# Patient Record
Sex: Female | Born: 1980 | Race: White | Hispanic: No | Smoking: Former smoker
Health system: Southern US, Community
[De-identification: ages and names within clinical notes are randomized; demographics above are authoritative.]

## PROBLEM LIST (undated history)

## (undated) ENCOUNTER — Inpatient Hospital Stay: Admission: EM | Payer: Self-pay | Source: Home / Self Care

## (undated) DIAGNOSIS — C50919 Malignant neoplasm of unspecified site of unspecified female breast: Secondary | ICD-10-CM

## (undated) DIAGNOSIS — Z8719 Personal history of other diseases of the digestive system: Secondary | ICD-10-CM

## (undated) DIAGNOSIS — Z9889 Other specified postprocedural states: Secondary | ICD-10-CM

## (undated) DIAGNOSIS — K279 Peptic ulcer, site unspecified, unspecified as acute or chronic, without hemorrhage or perforation: Secondary | ICD-10-CM

## (undated) DIAGNOSIS — T7840XA Allergy, unspecified, initial encounter: Secondary | ICD-10-CM

## (undated) DIAGNOSIS — Z9012 Acquired absence of left breast and nipple: Secondary | ICD-10-CM

## (undated) DIAGNOSIS — F419 Anxiety disorder, unspecified: Secondary | ICD-10-CM

## (undated) HISTORY — DX: Peptic ulcer, site unspecified, unspecified as acute or chronic, without hemorrhage or perforation: K27.9

## (undated) HISTORY — DX: Anxiety disorder, unspecified: F41.9

## (undated) HISTORY — DX: Allergy, unspecified, initial encounter: T78.40XA

## (undated) HISTORY — DX: Malignant neoplasm of unspecified site of unspecified female breast: C50.919

## (undated) HISTORY — PX: WISDOM TOOTH EXTRACTION: SHX21

---

## 2020-09-22 ENCOUNTER — Encounter: Payer: Self-pay | Admitting: Obstetrics & Gynecology

## 2020-10-06 ENCOUNTER — Ambulatory Visit (INDEPENDENT_AMBULATORY_CARE_PROVIDER_SITE_OTHER): Payer: BC Managed Care – PPO | Admitting: Adult Health

## 2020-10-06 ENCOUNTER — Encounter: Payer: Self-pay | Admitting: Adult Health

## 2020-10-06 ENCOUNTER — Other Ambulatory Visit: Payer: Self-pay

## 2020-10-06 VITALS — BP 115/76 | HR 92 | Ht 64.0 in | Wt 129.0 lb

## 2020-10-06 DIAGNOSIS — N6320 Unspecified lump in the left breast, unspecified quadrant: Secondary | ICD-10-CM | POA: Insufficient documentation

## 2020-10-06 DIAGNOSIS — R1032 Left lower quadrant pain: Secondary | ICD-10-CM

## 2020-10-06 DIAGNOSIS — N644 Mastodynia: Secondary | ICD-10-CM | POA: Insufficient documentation

## 2020-10-06 HISTORY — DX: Unspecified lump in the left breast, unspecified quadrant: N63.20

## 2020-10-06 HISTORY — DX: Mastodynia: N64.4

## 2020-10-06 HISTORY — DX: Left lower quadrant pain: R10.32

## 2020-10-06 NOTE — Progress Notes (Signed)
Patient ID: Pamela Reyes, female   DOB: 1980-08-08, 39 y.o.   MRN: [id number] History of Present Illness: Pamela Reyes is a 40 year old white female, single, G0P0, in complaining of left breast pain and pelvic pain, for 3-4 weeks, on left side, like an ache and pain with sex at times. She is a new pt. She moved here from [place]. She had mammogram, [place] and MRI 2021 in [place], ?cystic tissue   Current Medications, Allergies, Past Medical History, Past Surgical History, Family History and Social History were reviewed in [employer] record.     Review of Systems: Patient denies any headaches, hearing loss, fatigue, blurred vision, shortness of breath, chest pain,problems with bowel movements, urination, or intercourse. No joint pain or mood swings.  See HPI for positives.   Physical Exam:BP 115/76 (BP Location: Left Arm, Patient Position: Sitting, Cuff Size: Normal)   Pulse 92   Ht '5\' 4"'$  (1.626 m)   Wt 129 lb (58.5 kg)   LMP 09/22/2020   BMI 22.14 kg/m   General:  Well developed, well nourished, no acute distress Skin:  Warm and dry Neck:  Midline trachea, normal thyroid, good ROM, no lymphadenopathy Lungs; Clear to auscultation bilaterally Breast:  No dominant palpable mass, retraction, or nipple discharge, on the right, on the left, no nipple discharge or retraction has 3 x 5 cm from mass at 12 0 'clock at areola and irregularity around it  Cardiovascular: Regular rate and rhythm Abdomen:  Soft, non tender, no hepatosplenomegaly Pelvic:  External genitalia is normal in appearance, no lesions.  The vagina is normal in appearance. Urethra has no lesions or masses. The cervix is smooth.  Uterus is felt to be normal size, shape, and contour.  No adnexal masses or tenderness noted.Bladder is non tender, no masses felt. Rectal: Deferred Extremities/musculoskeletal:  No swelling or varicosities noted, no clubbing or cyanosis Psych:  No mood changes, alert and cooperative,seems happy AA is  4 Fall risk is lo Depression screen [facility] 2/9 10/06/2020  Decreased Interest 0  Down, Depressed, Hopeless 0  PHQ - 2 Score 0  Altered sleeping 0  Tired, decreased energy 1  Change in appetite 0  Feeling bad or failure about yourself  0  Trouble concentrating 0  Moving slowly or fidgety/restless 0  Suicidal thoughts 0  PHQ-9 Score 1    GAD 7 : Generalized Anxiety Score 10/06/2020  Nervous, Anxious, on Edge 1  Control/stop worrying 1  Worry too much - different things 0  Trouble relaxing 0  Restless 0  Easily annoyed or irritable 0  Afraid - awful might happen 1  Total GAD 7 Score 3      Upstream - 10/06/20 1448       Pregnancy Intention Screening   Does the patient want to become pregnant in the next year? No    Does the patient's partner want to become pregnant in the next year? No    Would the patient like to discuss contraceptive options today? No      Contraception Wrap Up   Current Method Oral Contraceptive    End Method Oral Contraceptive    Contraception Counseling Provided No            Examination chaperoned by Levy Pupa LPN   Impression and Plan: 1. Breast pain, left Scheduled diagnostic mammogram  and [place] at [facility] 10/28/20 at 10:30 am, to request films from [place] - US BREAST LTD UNI RIGHT INC AXILLA; Future - MM  DIAG BREAST TOMO BILATERAL; Future - US BREAST LTD UNI LEFT INC AXILLA; Future  2. Left breast mass - US BREAST LTD UNI RIGHT INC AXILLA; Future - MM DIAG BREAST TOMO BILATERAL; Future - US BREAST LTD UNI LEFT INC AXILLA; Future  3. LLQ pain Return to office for GYN [place] in about a week - US PELVIC COMPLETE WITH TRANSVAGINAL; Future  Pap and physical in December with me

## 2020-10-13 ENCOUNTER — Ambulatory Visit
Admission: RE | Admit: 2020-10-13 | Discharge: 2020-10-13 | Disposition: A | Discharge status: Home / Self Care | Payer: Self-pay | Source: Ambulatory Visit | Attending: Adult Health | Admitting: Adult Health

## 2020-10-13 ENCOUNTER — Other Ambulatory Visit: Payer: Self-pay | Admitting: Adult Health

## 2020-10-13 ENCOUNTER — Inpatient Hospital Stay
Admission: RE | Admit: 2020-10-13 | Discharge: 2020-10-13 | Disposition: A | Discharge status: Home / Self Care | Payer: Self-pay | Source: Ambulatory Visit | Attending: Adult Health | Admitting: Adult Health

## 2020-10-13 DIAGNOSIS — N644 Mastodynia: Secondary | ICD-10-CM

## 2020-10-13 DIAGNOSIS — N6320 Unspecified lump in the left breast, unspecified quadrant: Secondary | ICD-10-CM

## 2020-10-16 ENCOUNTER — Other Ambulatory Visit: Payer: Self-pay | Admitting: Adult Health

## 2020-10-16 ENCOUNTER — Inpatient Hospital Stay
Admission: RE | Admit: 2020-10-16 | Discharge: 2020-10-16 | Disposition: A | Discharge status: Home / Self Care | Payer: Self-pay | Source: Ambulatory Visit | Attending: Adult Health | Admitting: Adult Health

## 2020-10-16 ENCOUNTER — Ambulatory Visit
Admission: RE | Admit: 2020-10-16 | Discharge: 2020-10-16 | Disposition: A | Discharge status: Home / Self Care | Payer: Self-pay | Source: Ambulatory Visit | Attending: Adult Health | Admitting: Adult Health

## 2020-10-16 DIAGNOSIS — N644 Mastodynia: Secondary | ICD-10-CM

## 2020-10-16 DIAGNOSIS — N6320 Unspecified lump in the left breast, unspecified quadrant: Secondary | ICD-10-CM

## 2020-10-21 ENCOUNTER — Other Ambulatory Visit: Payer: Self-pay

## 2020-10-21 ENCOUNTER — Ambulatory Visit (INDEPENDENT_AMBULATORY_CARE_PROVIDER_SITE_OTHER): Payer: BC Managed Care – PPO

## 2020-10-21 DIAGNOSIS — R1032 Left lower quadrant pain: Secondary | ICD-10-CM | POA: Diagnosis not present

## 2020-10-21 NOTE — Progress Notes (Signed)
PELVIC US TA/TV: homogeneous anteverted uterus,posterior right subserosal fibroid 1 x .6 x .9 cm,EEC 4.1 mm,normal ovaries,ovaries appear mobile,no free fluid,no pain during ultrasound   Chaperone peggy

## 2020-10-23 ENCOUNTER — Other Ambulatory Visit: Payer: Self-pay | Admitting: Registered Nurse

## 2020-10-23 ENCOUNTER — Ambulatory Visit (INDEPENDENT_AMBULATORY_CARE_PROVIDER_SITE_OTHER): Payer: BC Managed Care – PPO | Admitting: Registered Nurse

## 2020-10-23 ENCOUNTER — Encounter: Payer: Self-pay | Admitting: Registered Nurse

## 2020-10-23 ENCOUNTER — Other Ambulatory Visit: Payer: Self-pay

## 2020-10-23 VITALS — BP 122/63 | HR 80 | Temp 98.2°F | Resp 18 | Ht 64.0 in | Wt 126.0 lb

## 2020-10-23 DIAGNOSIS — Z803 Family history of malignant neoplasm of breast: Secondary | ICD-10-CM

## 2020-10-23 DIAGNOSIS — Z1329 Encounter for screening for other suspected endocrine disorder: Secondary | ICD-10-CM

## 2020-10-23 DIAGNOSIS — E782 Mixed hyperlipidemia: Secondary | ICD-10-CM

## 2020-10-23 DIAGNOSIS — R202 Paresthesia of skin: Secondary | ICD-10-CM

## 2020-10-23 DIAGNOSIS — Z130 Encounter for screening for diseases of the blood and blood-forming organs and certain disorders involving the immune mechanism: Secondary | ICD-10-CM

## 2020-10-23 DIAGNOSIS — Z13220 Encounter for screening for lipoid disorders: Secondary | ICD-10-CM

## 2020-10-23 DIAGNOSIS — R1012 Left upper quadrant pain: Secondary | ICD-10-CM

## 2020-10-23 DIAGNOSIS — Z13228 Encounter for screening for other metabolic disorders: Secondary | ICD-10-CM | POA: Diagnosis not present

## 2020-10-23 DIAGNOSIS — N644 Mastodynia: Secondary | ICD-10-CM

## 2020-10-23 LAB — CBC WITH DIFFERENTIAL/PLATELET
Basophils Absolute: 0 10*3/uL (ref 0.0–0.1)
Basophils Relative: 0.4 % (ref 0.0–3.0)
Eosinophils Absolute: 0 10*3/uL (ref 0.0–0.7)
Eosinophils Relative: 0.2 % (ref 0.0–5.0)
HCT: 42.2 % (ref 36.0–46.0)
Hemoglobin: 14 g/dL (ref 12.0–15.0)
Lymphocytes Relative: 30 % (ref 12.0–46.0)
Lymphs Abs: 1.6 10*3/uL (ref 0.7–4.0)
MCHC: 33.3 g/dL (ref 30.0–36.0)
MCV: 91.2 fl (ref 78.0–100.0)
Monocytes Absolute: 0.3 10*3/uL (ref 0.1–1.0)
Monocytes Relative: 5.5 % (ref 3.0–12.0)
Neutro Abs: 3.3 10*3/uL (ref 1.4–7.7)
Neutrophils Relative %: 63.9 % (ref 43.0–77.0)
Platelets: 291 10*3/uL (ref 150.0–400.0)
RBC: 4.63 Mil/uL (ref 3.87–5.11)
RDW: 13.5 % (ref 11.5–15.5)
WBC: 5.2 10*3/uL (ref 4.0–10.5)

## 2020-10-23 LAB — HEMOGLOBIN A1C: Hgb A1c MFr Bld: 5.2 % (ref 4.6–6.5)

## 2020-10-23 LAB — COMPREHENSIVE METABOLIC PANEL
ALT: 9 U/L (ref 0–35)
AST: 10 U/L (ref 0–37)
Albumin: 4.4 g/dL (ref 3.5–5.2)
Alkaline Phosphatase: 24 U/L — ABNORMAL LOW (ref 39–117)
BUN: 16 mg/dL (ref 6–23)
CO2: 22 mEq/L (ref 19–32)
Calcium: 9.6 mg/dL (ref 8.4–10.5)
Chloride: 103 mEq/L (ref 96–112)
Creatinine, Ser: 0.72 mg/dL (ref 0.40–1.20)
GFR: 104.14 mL/min (ref >60.00)
Glucose, Bld: 86 mg/dL (ref 70–99)
Potassium: 4.3 mEq/L (ref 3.5–5.1)
Sodium: 138 mEq/L (ref 135–145)
Total Bilirubin: 0.4 mg/dL (ref 0.2–1.2)
Total Protein: 7.2 g/dL (ref 6.0–8.3)

## 2020-10-23 LAB — B12 AND FOLATE PANEL
Folate: 7.6 ng/mL (ref >5.9)
Vitamin B-12: 431 pg/mL (ref 211–911)

## 2020-10-23 LAB — LIPID PANEL
Cholesterol: 269 mg/dL — ABNORMAL HIGH (ref 0–200)
HDL: 53.1 mg/dL (ref >39.00)
LDL Cholesterol: 198 mg/dL — ABNORMAL HIGH (ref 0–99)
NonHDL: 215.66
Total CHOL/HDL Ratio: 5
Triglycerides: 90 mg/dL (ref 0.0–149.0)
VLDL: 18 mg/dL (ref 0.0–40.0)

## 2020-10-23 LAB — VITAMIN D 25 HYDROXY (VIT D DEFICIENCY, FRACTURES): VITD: 34.3 ng/mL (ref 30.00–100.00)

## 2020-10-23 LAB — TSH: TSH: 1.25 u[IU]/mL (ref 0.35–5.50)

## 2020-10-23 MED ORDER — ROSUVASTATIN CALCIUM 10 MG PO TABS: 10.0000 mg | ORAL_TABLET | Freq: Every day | ORAL | 3 refills | Status: DC

## 2020-10-23 NOTE — Patient Instructions (Addendum)
Ms. Gabreil Amsbaugh to meet you  In brief: Labs today Start pantoprazole '40mg'$  once daily. Take for at least two weeks. Monitor response. Let me know if new or different symptoms arise. Return in 6 mo for annual exam, repeat labs Call if symptoms worsening or failing to improve I'll be able to follow along with mammo results in your chart  Thank you  Rich     If you have lab work done today you will be contacted with your lab results within the next 2 weeks.  If you have not heard from [place] then please contact us. The fastest way to get your results is to register for My Chart.   IF you received an x-ray today, you will receive an invoice from [facility]. Please contact [facility] at [phone] with questions or concerns regarding your invoice.   IF you received labwork today, you will receive an invoice from [facility]. Please contact [facility] at [phone] with questions or concerns regarding your invoice.   Our billing staff will not be able to assist you with questions regarding bills from these companies.  You will be contacted with the lab results as soon as they are available. The fastest way to get your results is to activate your My Chart account. Instructions are located on the last page of this paperwork. If you have not heard from [place] regarding the results in 2 weeks, please contact this office.

## 2020-10-23 NOTE — Progress Notes (Signed)
New Patient Office Visit  Subjective:  Patient ID: Pamela Reyes, female    DOB: 04-12-1980  Age: 40 y.o. MRN: [id number]  CC:  Chief Complaint  Patient presents with   New Patient (Initial Visit)    Patient states she is here to [facility] care. Patient has been having some pain on left side of ribcage, dhoulder and armpit for one year.    HPI Wanette Seim presents for visit to est  Histories reviewed and updated with patient.   Left side pain Lower left chest / upper left abdomen On and off for some time Hx of peptic ulcer. Concerned it feels similar. Has not been on NSAIDs Not taking medication for relief. Fam hx of breast ca - has mammo and [place] next week. 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  Some shoulder pain, thinks this is unrelated, does not think her pain of concern is msk Does note some numbness in hands, particularly L hand. Unsure if related.   Otherwise no concerns. Doing well. Relocated to [place] from [place] in Nov 2021  Past Medical History:  Diagnosis Date   Anxiety    Peptic ulcer     History reviewed. No pertinent surgical history.  Family History  Problem Relation Age of Onset   Congestive Heart Failure Father    Breast cancer Sister    Stroke Maternal Grandmother    Cancer Maternal Grandfather        lung   Lung cancer Maternal Grandfather        smoker   Breast cancer Paternal Grandmother     Social History   Socioeconomic History   Marital status: Significant Other    Spouse name: Not on file   Number of children: 0   Years of education: Not on file   Highest education level: Not on file  Occupational History   Not on file  Tobacco Use   Smoking status: Former    Years: 2.00    Types: Cigarettes   Smokeless tobacco: Never  Vaping Use   Vaping Use: Never used  Substance and Sexual Activity   Alcohol use: Yes    Comment: 2-3 times a week   Drug  use: Never   Sexual activity: Yes    Birth control/protection: Pill  Other Topics Concern   Not on file  Social History Narrative   Not on file   Social Determinants of Health   Financial Resource Strain: Low Risk    Difficulty of Paying Living Expenses: Not hard at all  Food Insecurity: No Food Insecurity   Worried About Charity fundraiser in the Last Year: Never true   Ran Out of Food in the Last Year: Never true  Transportation Needs: No Transportation Needs   Lack of Transportation (Medical): No   Lack of Transportation (Non-Medical): No  Physical Activity: Insufficiently Active   Days of Exercise per Week: 2 days   Minutes of Exercise per Session: 30 min  Stress: Stress Concern Present   Feeling of Stress : To some extent  Social Connections: Moderately Isolated   Frequency of Communication with Friends and Family: Twice a week   Frequency of Social Gatherings with Friends and Family: Twice a week   Attends Religious Services: Never   Marine scientist or Organizations: No   Attends Archivist Meetings: Never   Marital Status: Living with partner  Human resources officer  Violence: Not At Risk   Fear of Current or Ex-Partner: No   Emotionally Abused: No   Physically Abused: No   Sexually Abused: No    ROS Review of Systems  Constitutional: Negative.   HENT: Negative.    Eyes: Negative.   Respiratory: Negative.    Cardiovascular: Negative.   Gastrointestinal: Negative.   Genitourinary: Negative.   Musculoskeletal: Negative.   Skin: Negative.   Neurological: Negative.   Psychiatric/Behavioral: Negative.    All other systems reviewed and are negative.  Objective:   Today's Vitals: BP 122/63   Pulse 80   Temp 98.2 F (36.8 C) (Temporal)   Resp 18   Ht '5\' 4"'$  (1.626 m)   Wt 126 lb (57.2 kg)   SpO2 99%   BMI 21.63 kg/m   Physical Exam Vitals and nursing note reviewed.  Constitutional:      General: She is not in acute distress.    Appearance:  Normal appearance. She is not ill-appearing, toxic-appearing or diaphoretic.  Cardiovascular:     Rate and Rhythm: Normal rate and regular rhythm.     Pulses: Normal pulses.     Heart sounds: Normal heart sounds. No murmur heard.   No friction rub. No gallop.  Pulmonary:     Effort: Pulmonary effort is normal. No respiratory distress.     Breath sounds: Normal breath sounds. No stridor. No wheezing, rhonchi or rales.  Chest:     Chest wall: No tenderness.  Musculoskeletal:        General: No swelling, tenderness, deformity or signs of injury. Normal range of motion.     Right lower leg: No edema.     Left lower leg: No edema.  Skin:    General: Skin is warm and dry.     Capillary Refill: Capillary refill takes less than 2 seconds.  Neurological:     General: No focal deficit present.     Mental Status: She is alert and oriented to person, place, and time. Mental status is at baseline.  Psychiatric:        Mood and Affect: Mood normal.        Behavior: Behavior normal.        Thought Content: Thought content normal.        Judgment: Judgment normal.    Assessment & Plan:   Problem List Items Addressed This Visit       Other   Breast pain, left   Other Visit Diagnoses     Abdominal discomfort in left upper quadrant    -  Primary   Arm paresthesia, left       Relevant Orders   Vitamin D (25 hydroxy)   B12 and Folate Panel   Screening for endocrine, metabolic and immunity disorder       Relevant Orders   CBC with Differential/Platelet   Comprehensive metabolic panel   Hemoglobin A1c   TSH   Lipid screening       Relevant Orders   Lipid panel   Family history of breast cancer           Outpatient Encounter Medications as of 10/23/2020  Medication Sig   JUNEL FE 1.5/30 1.5-30 MG-MCG tablet Take 1 tablet by mouth daily.   No facility-administered encounter medications on file as of 10/23/2020.    Follow-up: Return in about 6 months (around 04/22/2021) for CPE and  labs.   PLAN Pantoprazole '40mg'$  po qd. Suspect gastritis, perhaps start of PUD, but not certain -  labs Labs collected. Will follow up with the patient as warranted. Consider CT abd/pelv or cxr pending mammo results and response to therapy.  Continue Junel managed by GYN Return in 6 mo for CPE, repeat labs Patient encouraged to call clinic with any questions, comments, or concerns.  Maximiano Coss, NP

## 2020-10-25 ENCOUNTER — Encounter: Payer: Self-pay | Admitting: Registered Nurse

## 2020-10-27 ENCOUNTER — Telehealth: Payer: Self-pay | Admitting: Adult Health

## 2020-10-27 ENCOUNTER — Encounter: Payer: Self-pay | Admitting: Adult Health

## 2020-10-27 DIAGNOSIS — D219 Benign neoplasm of connective and other soft tissue, unspecified: Secondary | ICD-10-CM

## 2020-10-27 HISTORY — DX: Benign neoplasm of connective and other soft tissue, unspecified: D21.9

## 2020-10-27 NOTE — Telephone Encounter (Signed)
Pt aware that US showed normal ovaries and small 1 cm fibroid, should not be cause of LLQ pain could be bowel related

## 2020-10-28 ENCOUNTER — Ambulatory Visit (HOSPITAL_COMMUNITY)
Admission: RE | Admit: 2020-10-28 | Discharge: 2020-10-28 | Disposition: A | Discharge status: Home / Self Care | Payer: BC Managed Care – PPO | Source: Ambulatory Visit | Attending: Adult Health | Admitting: Adult Health

## 2020-10-28 ENCOUNTER — Other Ambulatory Visit: Payer: Self-pay

## 2020-10-28 ENCOUNTER — Encounter (HOSPITAL_COMMUNITY): Payer: Self-pay

## 2020-10-28 ENCOUNTER — Other Ambulatory Visit (HOSPITAL_COMMUNITY): Payer: Self-pay | Admitting: Adult Health

## 2020-10-28 DIAGNOSIS — N6320 Unspecified lump in the left breast, unspecified quadrant: Secondary | ICD-10-CM

## 2020-10-28 DIAGNOSIS — N644 Mastodynia: Secondary | ICD-10-CM

## 2020-10-28 IMAGING — US US BREAST*L* LIMITED INC AXILLA
1 series · 13 of 25 positions shown · non-contrast
Comparison: Previous exams.

CLINICAL DATA: 40-year-old female with a palpable area of concern
in the left breast which she reports is slowly growing in size.
History of benign MRI guided biopsy of the left breast in [24]. The
patient does have a strong history of breast cancer, including in
her sister having been diagnosed with breast cancer as well as a
maternal aunt and paternal grandmother.

EXAM:
DIGITAL DIAGNOSTIC BILATERAL MAMMOGRAM WITH TOMOSYNTHESIS AND CAD;
ULTRASOUND LEFT BREAST LIMITED
TECHNIQUE: Bilateral digital diagnostic mammography and breast tomosynthesis
was performed. The images were evaluated with computer-aided
detection.; Targeted ultrasound examination of the left breast was
performed.

[Series 1: us breast*left* limited inc axilla · 0.07mm/px · 13 of 32 slices shown]
[im 1/32]
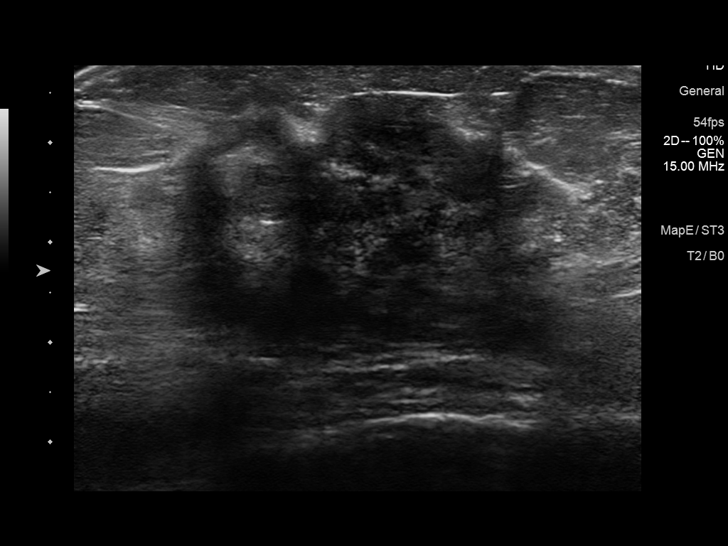
[im 3/32]
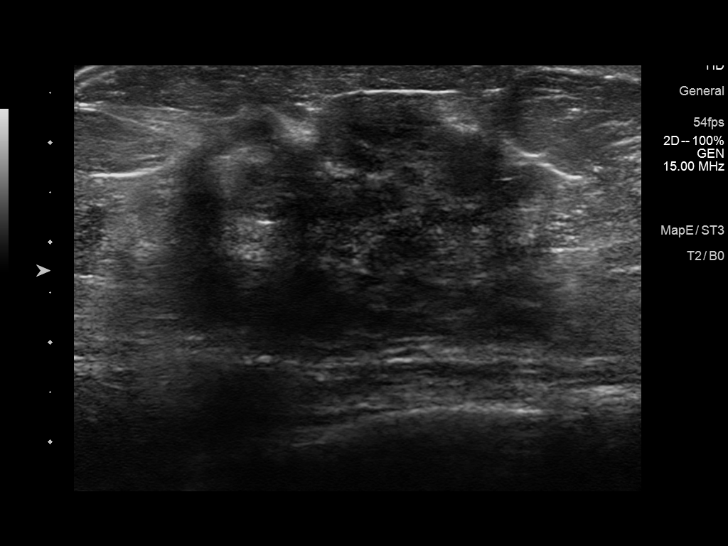
[im 6/32]
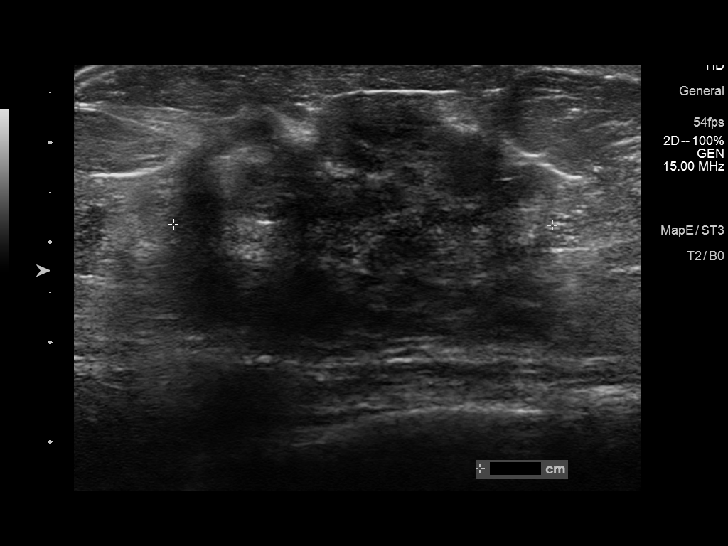
[im 8/32]
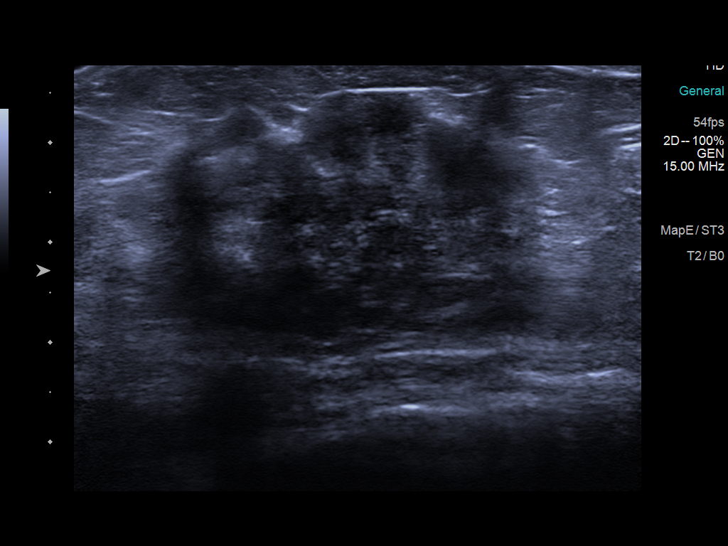
[im 11/32]
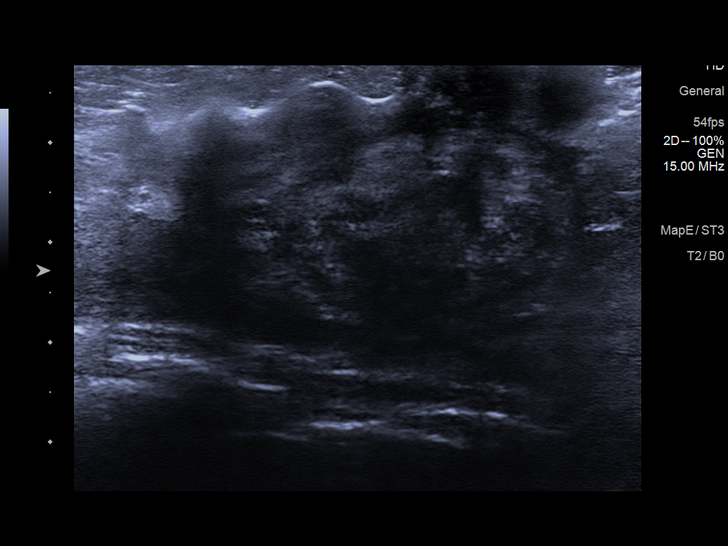
[im 13/32]
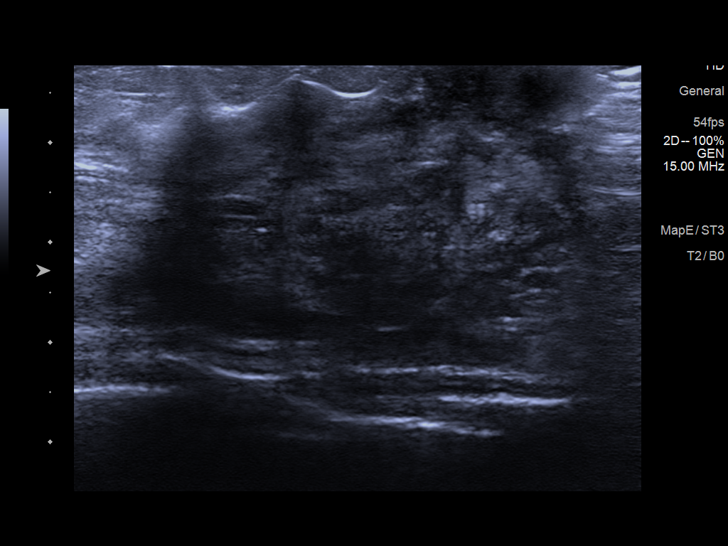
[im 16/32]
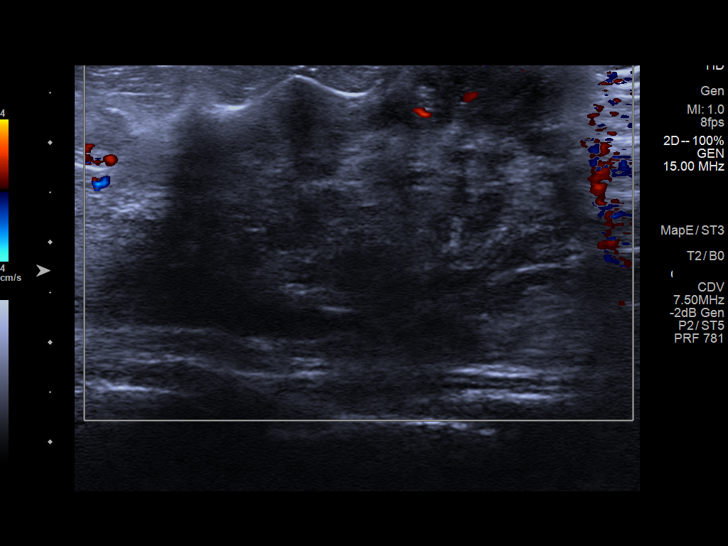
[im 19/32]
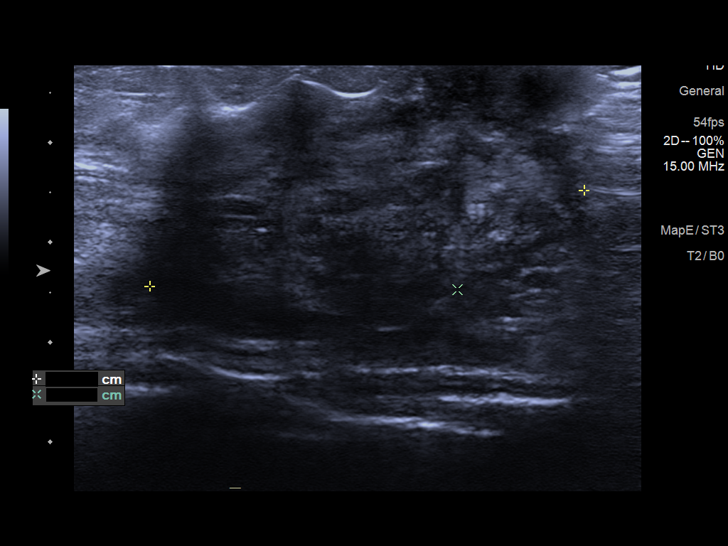
[im 21/32]
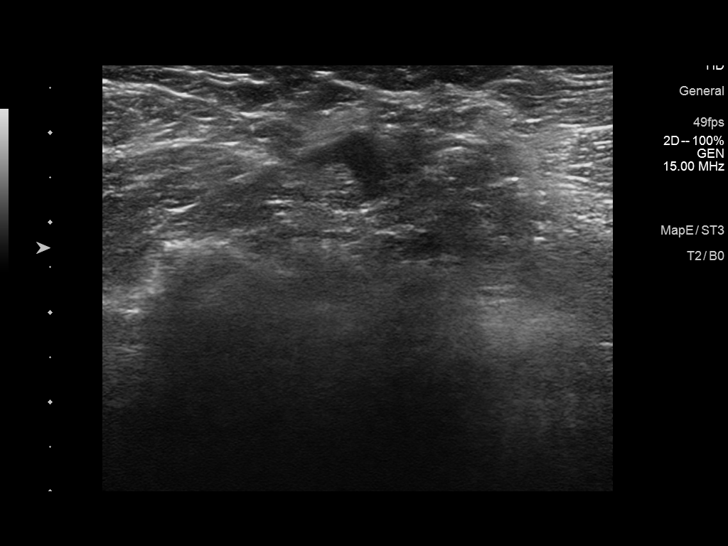
[im 24/32]
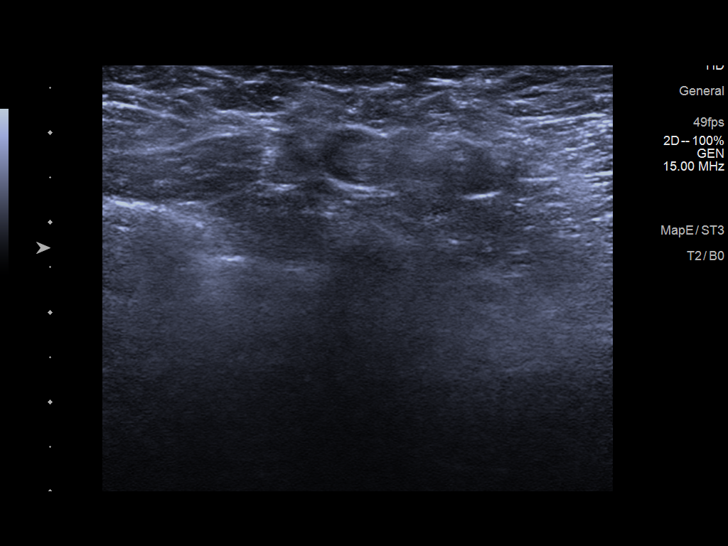
[im 26/32]
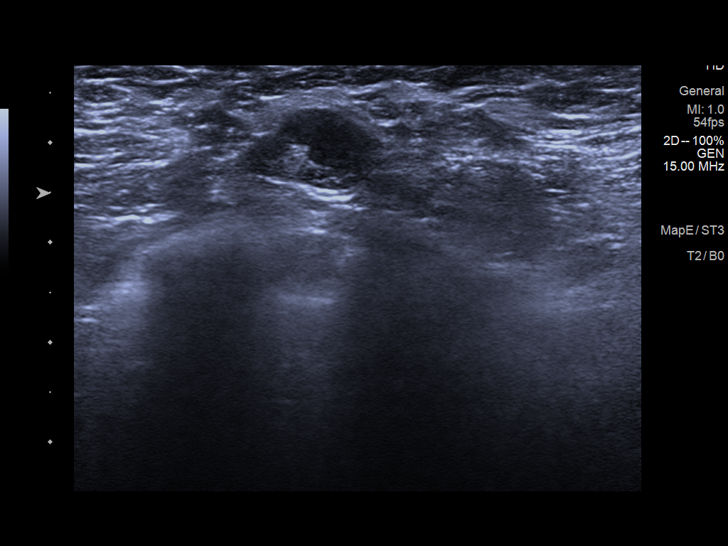
[im 29/32]
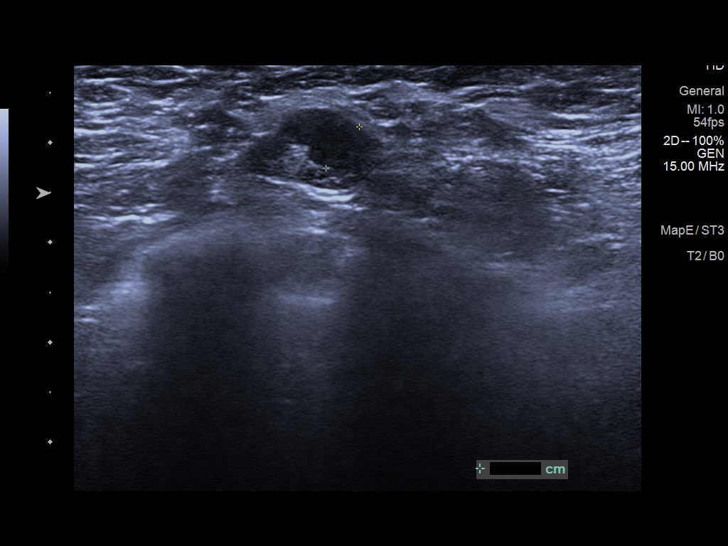
[im 32/32]
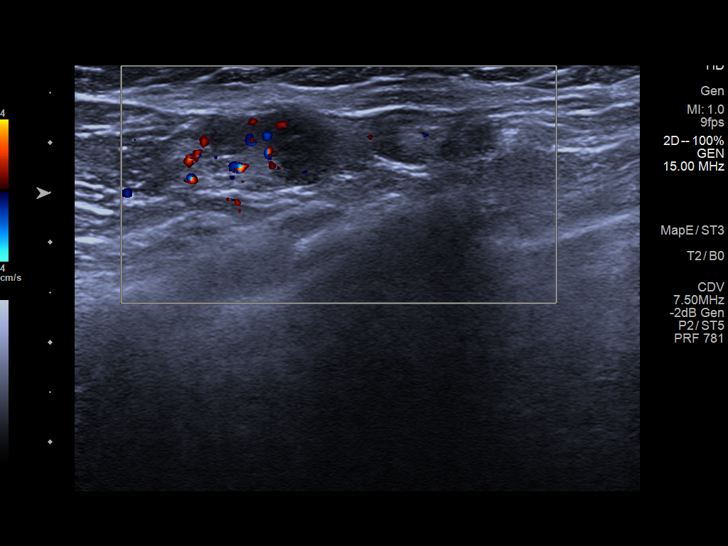

[13 of 25 positions shown; findings below may reference images not displayed]

ACR Breast Density Category d: The breast tissue is extremely dense,
which lowers the sensitivity of mammography.
FINDINGS: There is masslike increased density predominantly involving the
central left breast and corresponding to the palpable area of
concern. The left breast appears progressively smaller with
flattening of the left nipple. Initially questioned scattered
calcifications in outer left breast are felt to be stable. Several
lymph nodes are partially identified in the left axilla with
possible cortical thickening. No suspicious masses or calcifications
seen in the right breast.

Physical examination reveals a large firm masslike area involving
the entire central left breast.

Targeted ultrasound of the left breast was performed. There is a
large ill-defined hypoechoic area/mass in the left breast at 12
o'clock retroareolar measuring 4.5 x 2.3 x 3.8 cm. This is felt to
correspond the masslike density involving the central left breast.
There is a lymph node identified with cortical thickening in the
left axilla.
IMPRESSION: 1. Suspicious palpable mass/hypoechoic area in the left breast at 12
o'clock retroareolar measuring 4.5 cm.

2.  Suspicious lymph node in the left axilla.

RECOMMENDATION:
1. Recommend ultrasound-guided biopsy of the mass in the left breast
at 12 o'clock retroareolar.

2. Recommend ultrasound-guided biopsy of the abnormal lymph node in
the left axilla.

I have discussed the findings and recommendations with the patient.
If applicable, a reminder letter will be sent to the patient
regarding the next appointment.

BI-RADS CATEGORY  4: Suspicious.

## 2020-10-28 IMAGING — MG DIGITAL DIAGNOSTIC BILAT W/ TOMO W/ CAD
8 of 12 series · 8 of 32 positions shown · non-contrast
Comparison: Previous exams.

CLINICAL DATA: 40-year-old female with a palpable area of concern
in the left breast which she reports is slowly growing in size.
History of benign MRI guided biopsy of the left breast in [24]. The
patient does have a strong history of breast cancer, including in
her sister having been diagnosed with breast cancer as well as a
maternal aunt and paternal grandmother.

EXAM:
DIGITAL DIAGNOSTIC BILATERAL MAMMOGRAM WITH TOMOSYNTHESIS AND CAD;
ULTRASOUND LEFT BREAST LIMITED
TECHNIQUE: Bilateral digital diagnostic mammography and breast tomosynthesis
was performed. The images were evaluated with computer-aided
detection.; Targeted ultrasound examination of the left breast was
performed.

[L ML]
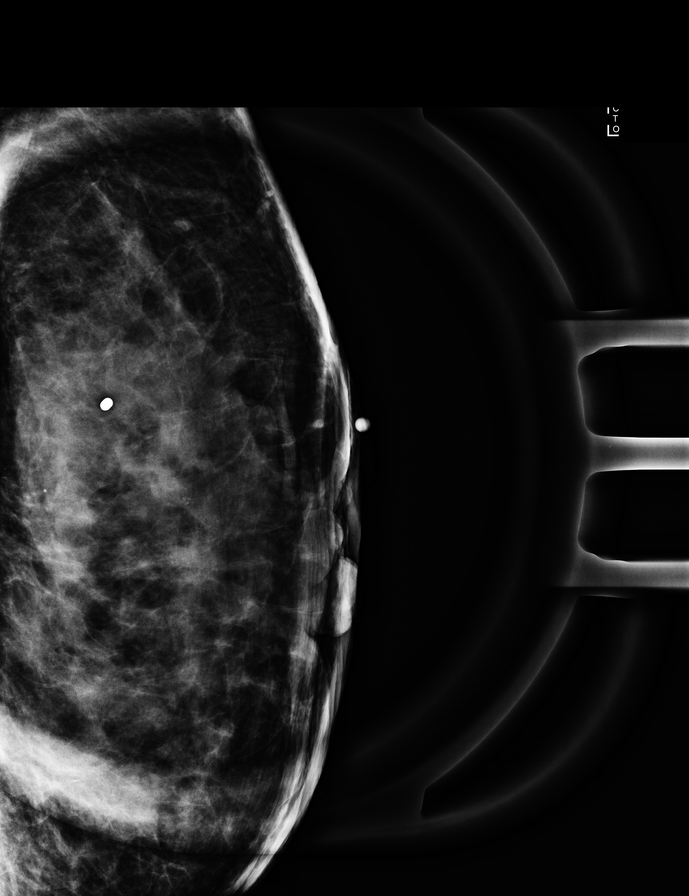

[L CC]
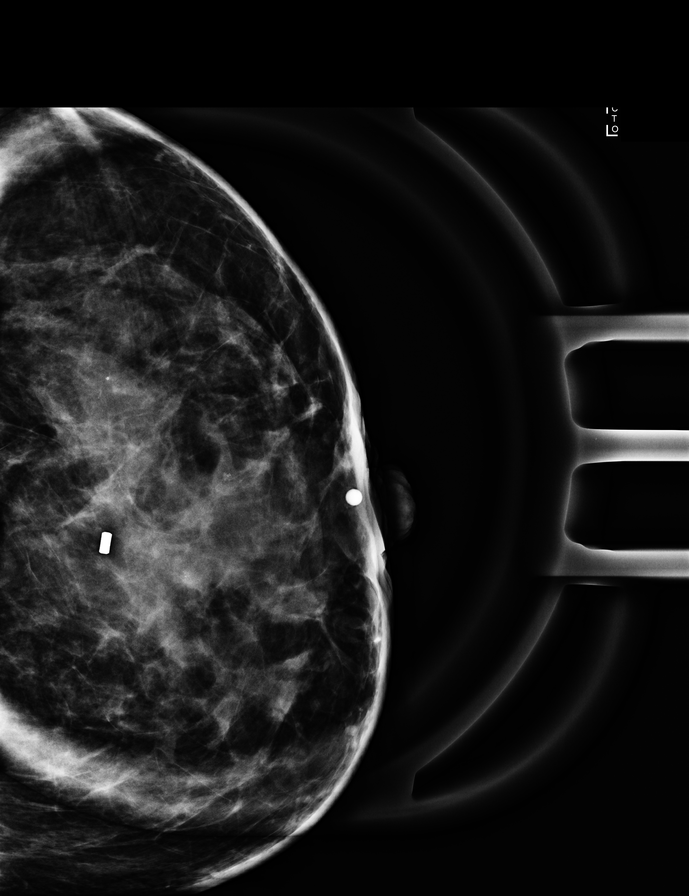

[R MLO synth-2D]
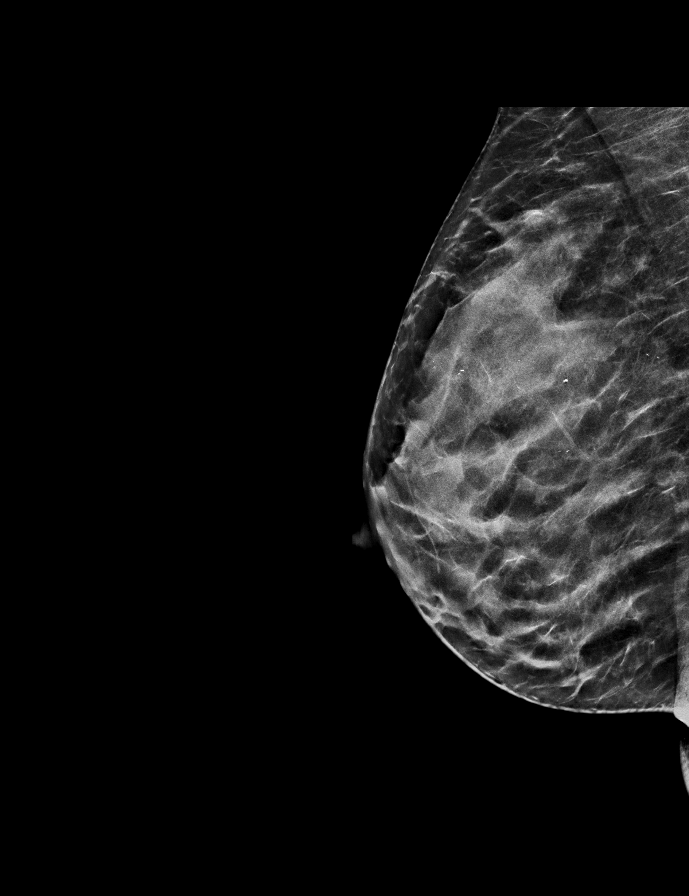

[L TAN synth-2D]
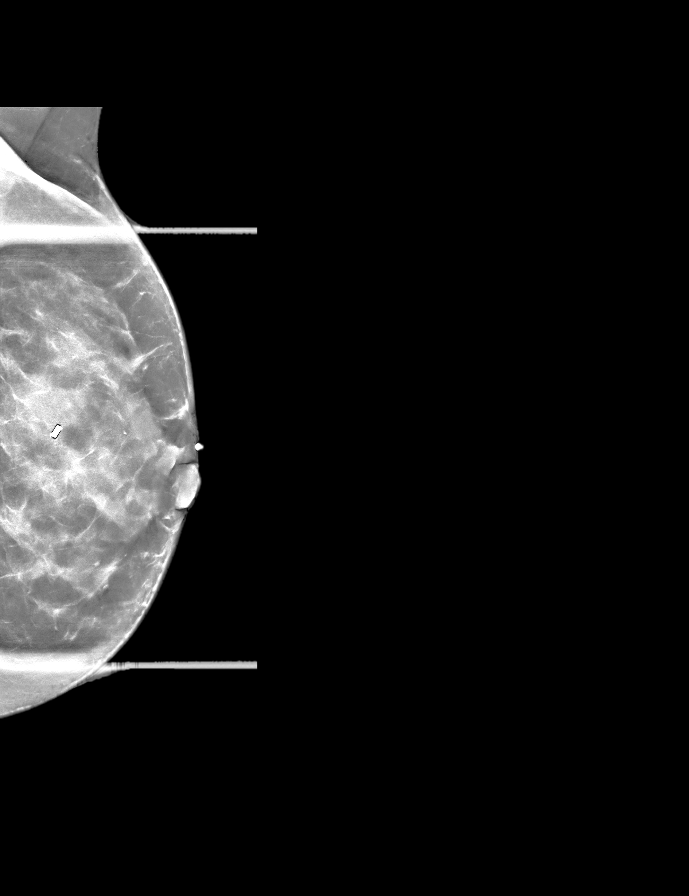

[R CC synth-2D]
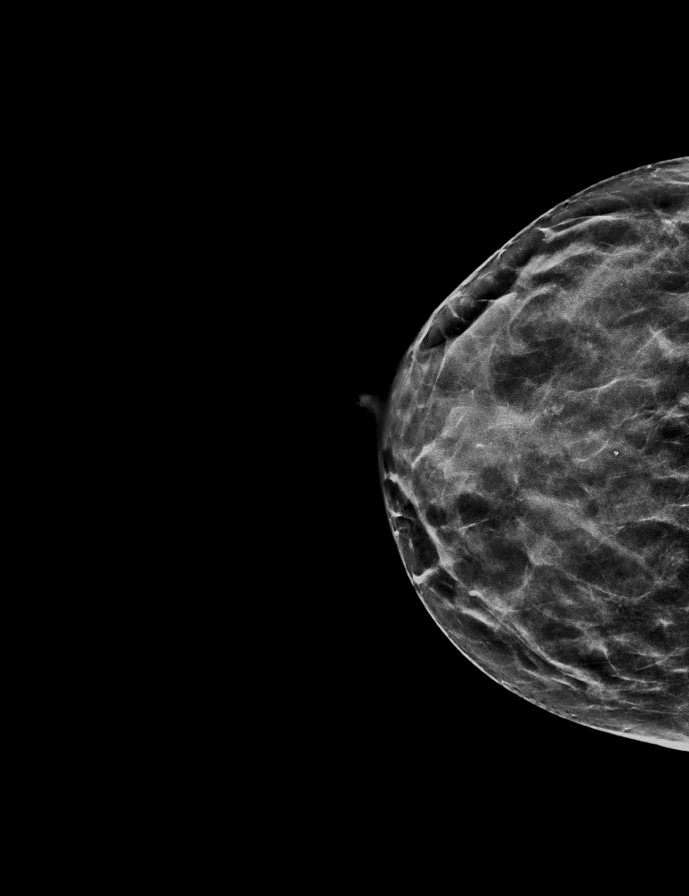

[L CC synth-2D]
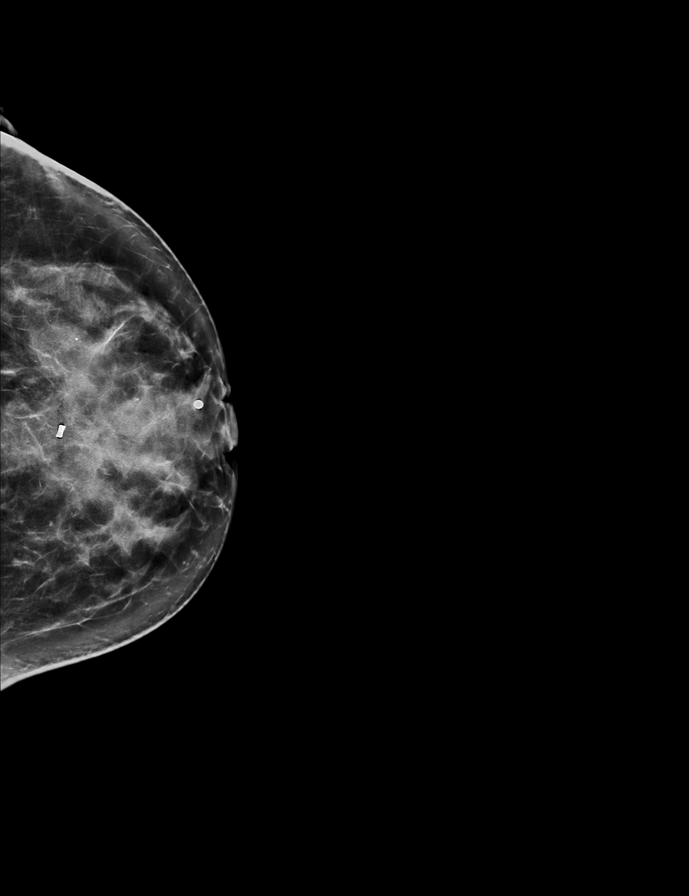

[L MLO synth-2D]
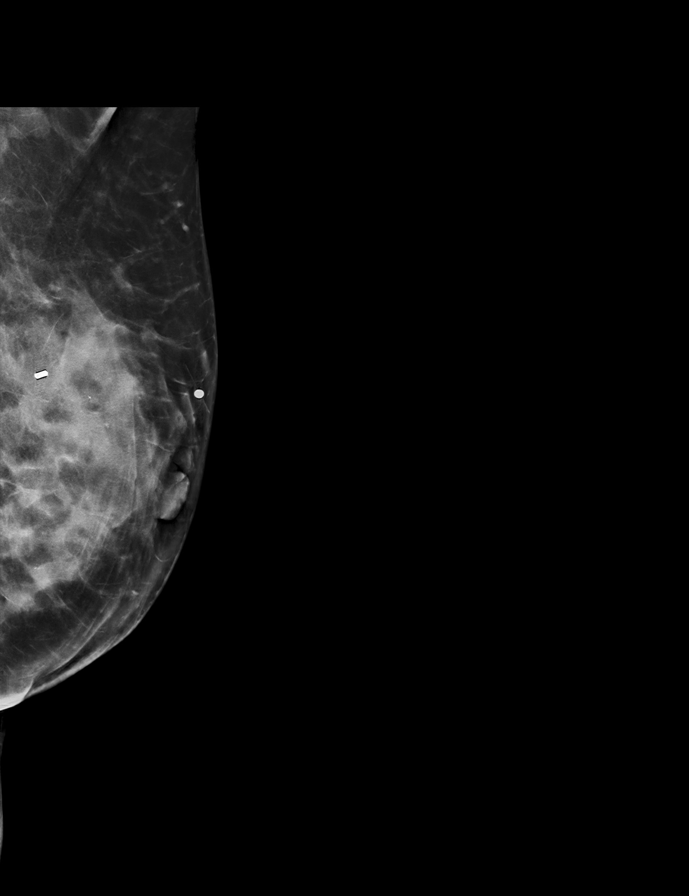

[R CC tomo · tomo slice 27/52.0]
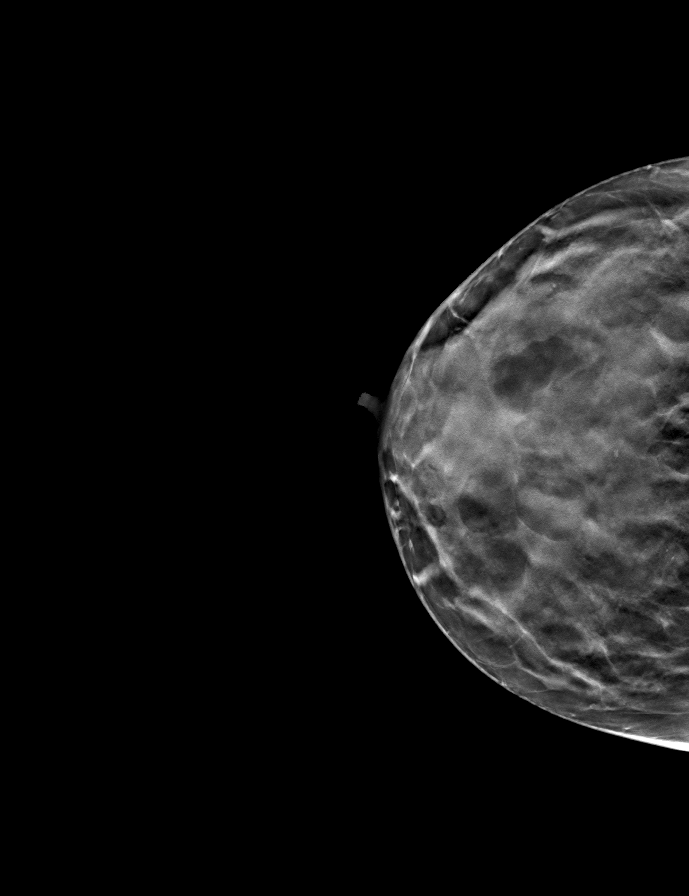

[8 of 32 positions shown; findings below may reference images not displayed]

ACR Breast Density Category d: The breast tissue is extremely dense,
which lowers the sensitivity of mammography.
FINDINGS: There is masslike increased density predominantly involving the
central left breast and corresponding to the palpable area of
concern. The left breast appears progressively smaller with
flattening of the left nipple. Initially questioned scattered
calcifications in outer left breast are felt to be stable. Several
lymph nodes are partially identified in the left axilla with
possible cortical thickening. No suspicious masses or calcifications
seen in the right breast.

Physical examination reveals a large firm masslike area involving
the entire central left breast.

Targeted ultrasound of the left breast was performed. There is a
large ill-defined hypoechoic area/mass in the left breast at 12
o'clock retroareolar measuring 4.5 x 2.3 x 3.8 cm. This is felt to
correspond the masslike density involving the central left breast.
There is a lymph node identified with cortical thickening in the
left axilla.
IMPRESSION: 1. Suspicious palpable mass/hypoechoic area in the left breast at 12
o'clock retroareolar measuring 4.5 cm.

2.  Suspicious lymph node in the left axilla.

RECOMMENDATION:
1. Recommend ultrasound-guided biopsy of the mass in the left breast
at 12 o'clock retroareolar.

2. Recommend ultrasound-guided biopsy of the abnormal lymph node in
the left axilla.

I have discussed the findings and recommendations with the patient.
If applicable, a reminder letter will be sent to the patient
regarding the next appointment.

BI-RADS CATEGORY  4: Suspicious.

## 2020-11-02 ENCOUNTER — Telehealth: Payer: Self-pay | Admitting: Adult Health

## 2020-11-02 NOTE — Telephone Encounter (Signed)
Left message that mammogram and US showed suspicious mass and that biopsy scheduled for 11/05/20, cal me with any concerns or questions.

## 2020-11-05 ENCOUNTER — Ambulatory Visit
Admission: RE | Admit: 2020-11-05 | Discharge: 2020-11-05 | Disposition: A | Discharge status: Home / Self Care | Payer: BC Managed Care – PPO | Source: Ambulatory Visit | Attending: Adult Health | Admitting: Adult Health

## 2020-11-05 ENCOUNTER — Other Ambulatory Visit (HOSPITAL_COMMUNITY): Payer: Self-pay | Admitting: Adult Health

## 2020-11-05 ENCOUNTER — Other Ambulatory Visit: Payer: Self-pay

## 2020-11-05 DIAGNOSIS — N6320 Unspecified lump in the left breast, unspecified quadrant: Secondary | ICD-10-CM

## 2020-11-05 DIAGNOSIS — N644 Mastodynia: Secondary | ICD-10-CM

## 2020-11-05 IMAGING — MG MM BREAST LOCALIZATION CLIP
4 series · 4 of 12 positions shown · non-contrast
Comparison: Previous exam(s).

CLINICAL DATA: Post ultrasound-guided biopsy a suspicious palpable
mass in the left breast at 12 o'clock subareolar and
ultrasound-guided biopsy of a lymph node in the left axilla with
cortical thickening.

EXAM:
3D DIAGNOSTIC LEFT MAMMOGRAM POST ULTRASOUND BIOPSY

[L CC synth-2D]
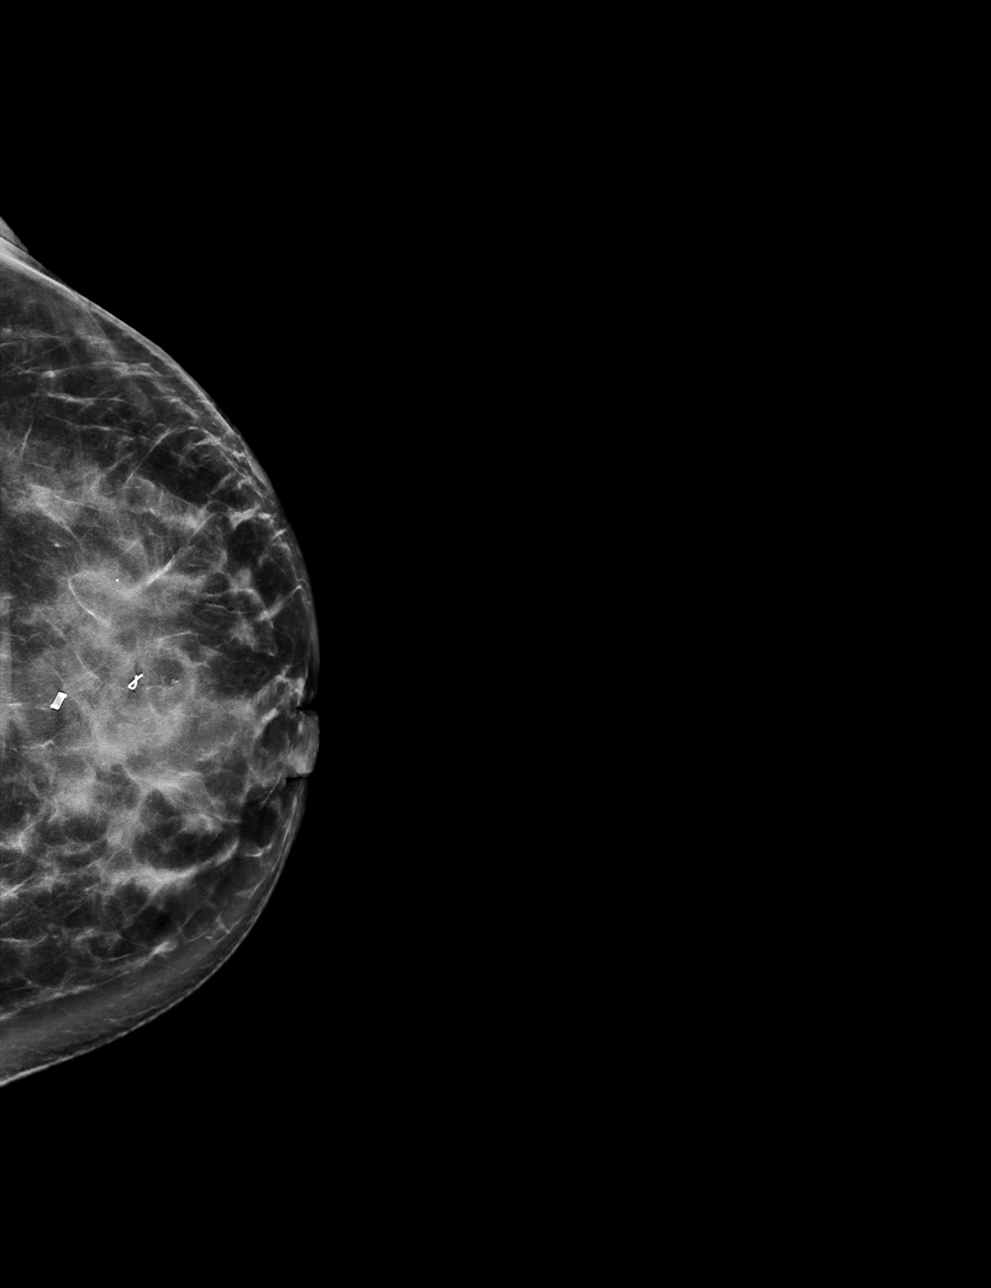

[L ML synth-2D]
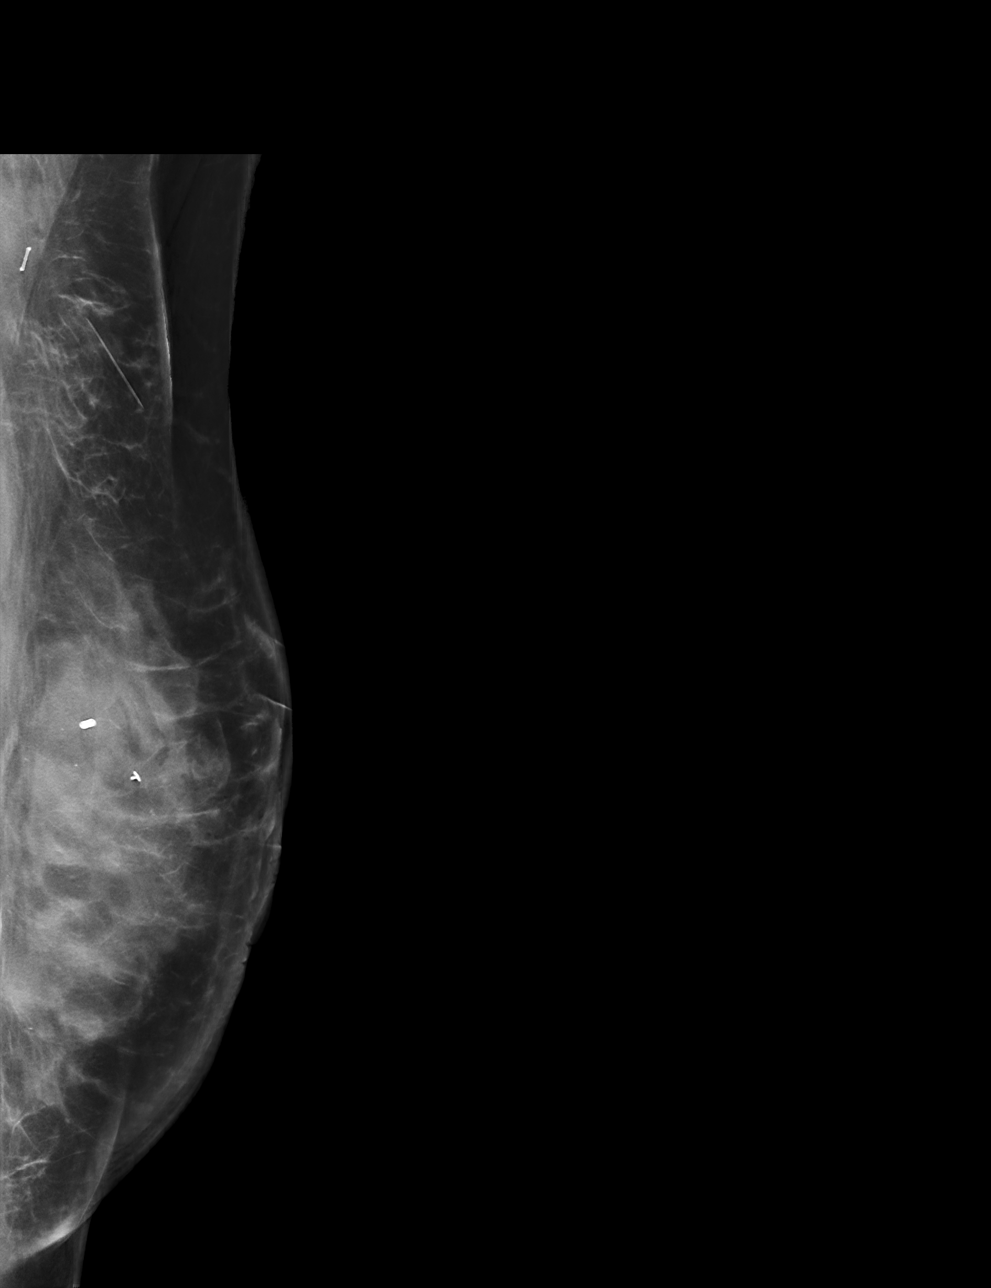

[L ML tomo · tomo slice 49/96.0]
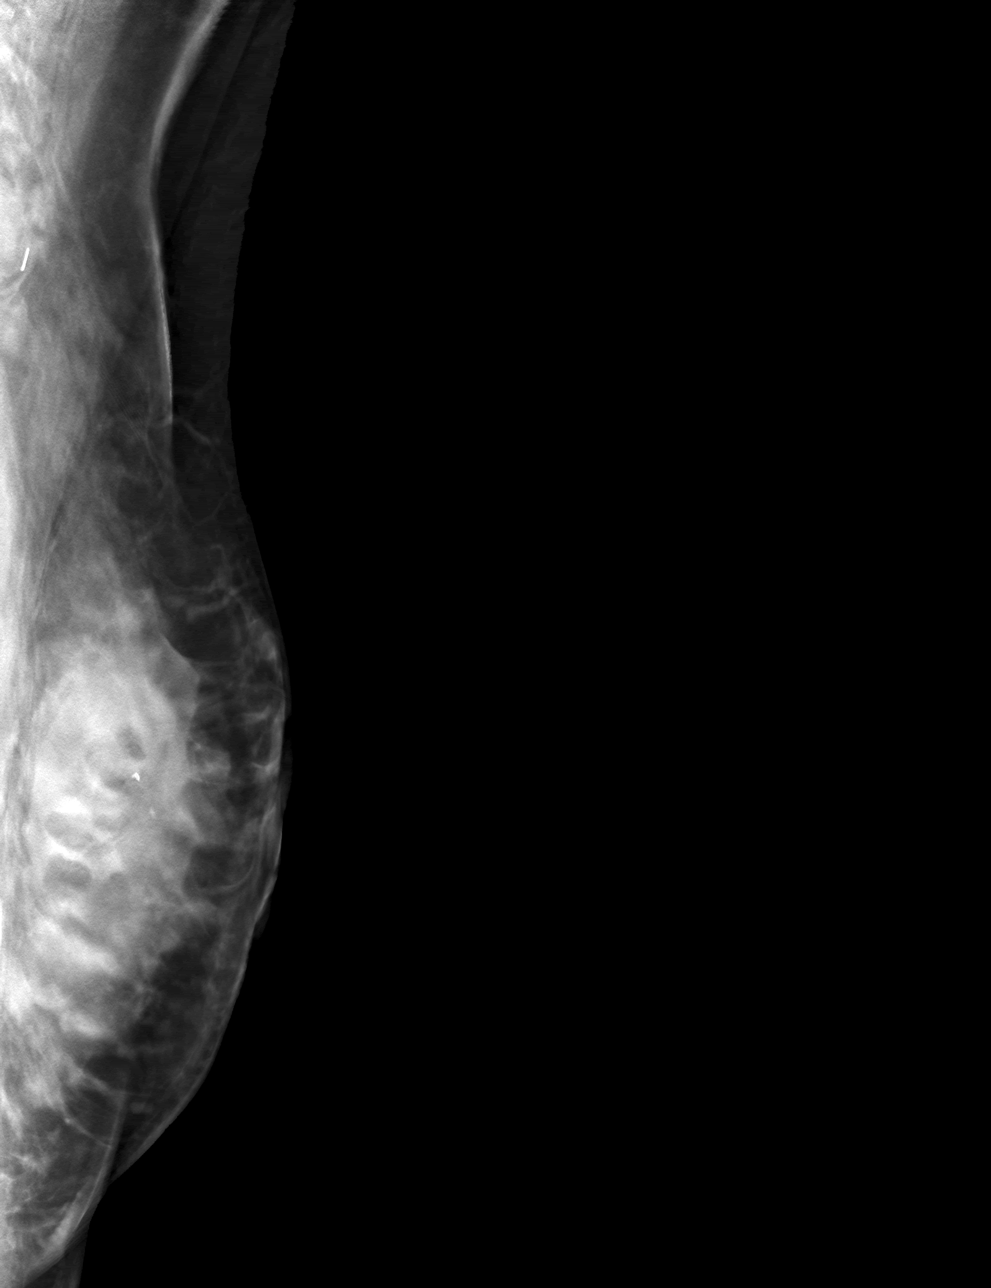

[L CC tomo · tomo slice 34/67.0]
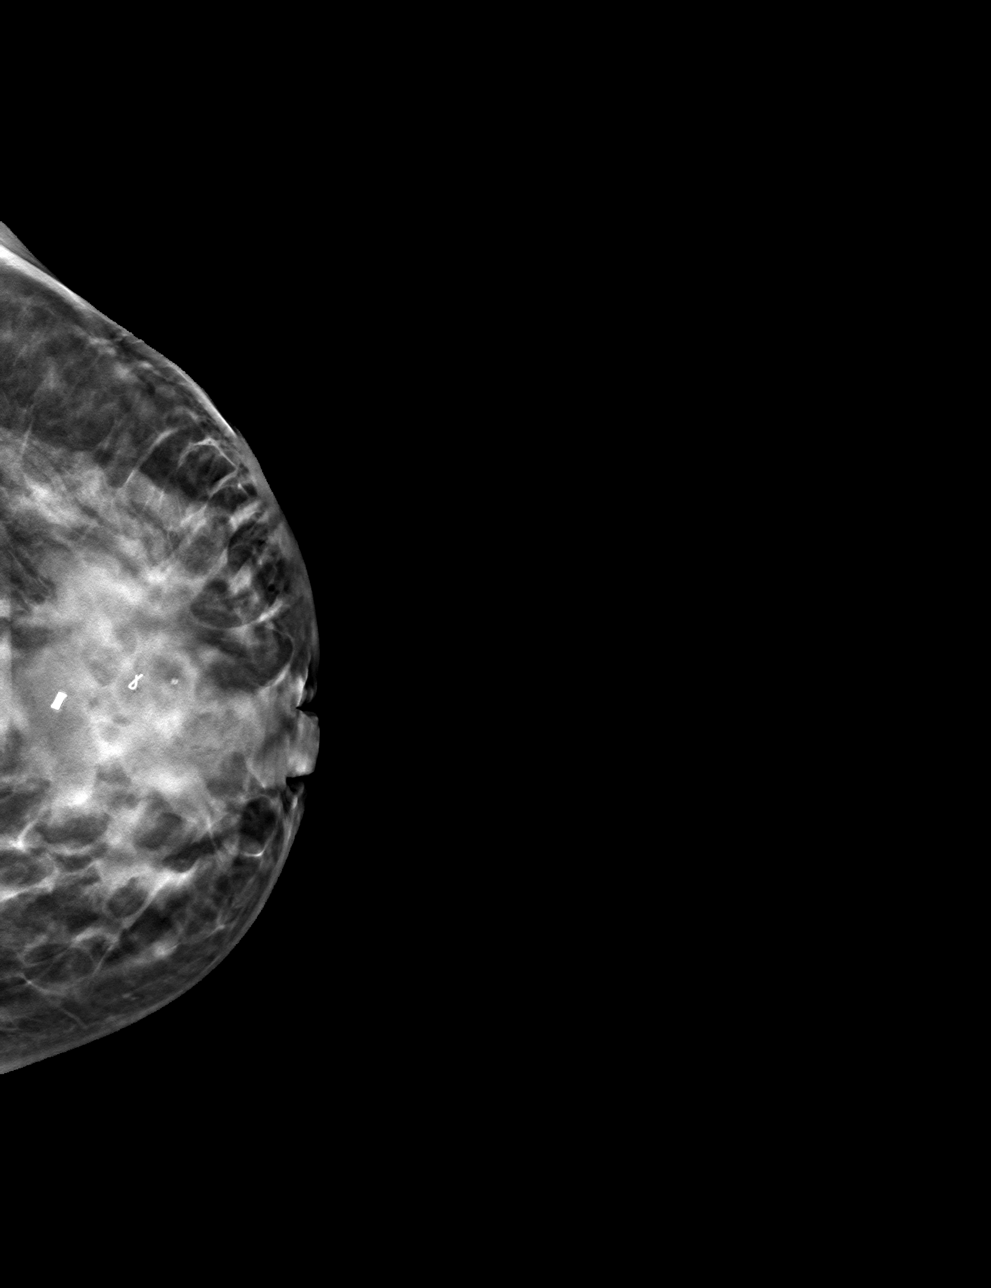

[4 of 12 positions shown; findings below may reference images not displayed]

FINDINGS: 3D Mammographic images were obtained following ultrasound-guided
biopsy a suspicious palpable mass in the left breast at 12 o'clock
subareolar and ultrasound-guided biopsy of a lymph node in the left
axilla with cortical thickening.

A ribbon shaped biopsy marking clip is present at the site of the
biopsied mass in the left breast at 12 o'clock subareolar and a
tribell biopsy marking clip is present at the site of the biopsied
lymph node in the left axilla.
IMPRESSION: 1. Ribbon shaped biopsy marking clip at site of the biopsied mass in
the left breast at 12 o'clock subareolar.

2. Tribell biopsy marking clip at site of the biopsied lymph node in
the left axilla.

Final Assessment: Post Procedure Mammograms for Marker Placement

## 2020-11-05 IMAGING — US US BREAST BX W LOC DEV 1ST LESION IMG BX SPEC US GUIDE*L*
1 series · 12 of 19 positions shown · non-contrast
Comparison: Previous exam(s).
COMPARISON: Previous exam(s).

Addendum:
CLINICAL DATA: 40-year-old female with a suspicious palpable mass
in the left breast at 12 o'clock retroareolar and an indeterminate
lymph node in the left axilla.

EXAM:
ULTRASOUND GUIDED LEFT BREAST CORE NEEDLE BIOPSY
ULTRASOUND-GUIDED LEFT AXILLA CORE NEEDLE BIOPSY

[Series 1: us breast bx w loc dev 1st lesion img bx spec us g · 0.07mm/px · 12 of 19 slices shown]
[im 1/19]
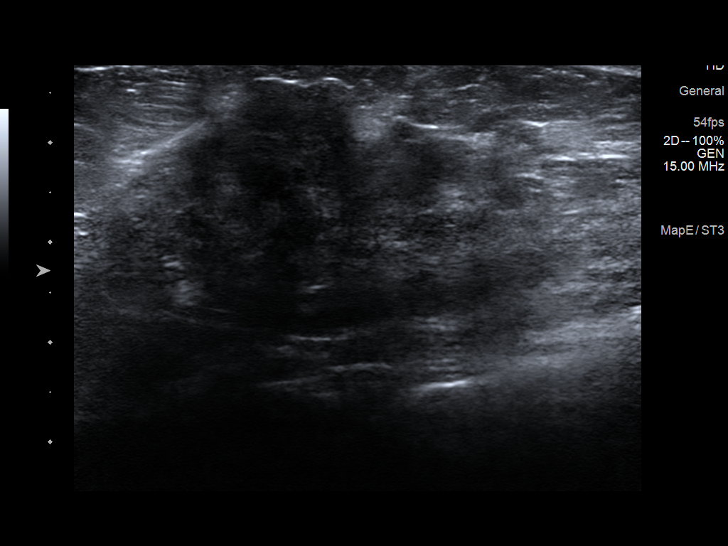
[im 3/19]
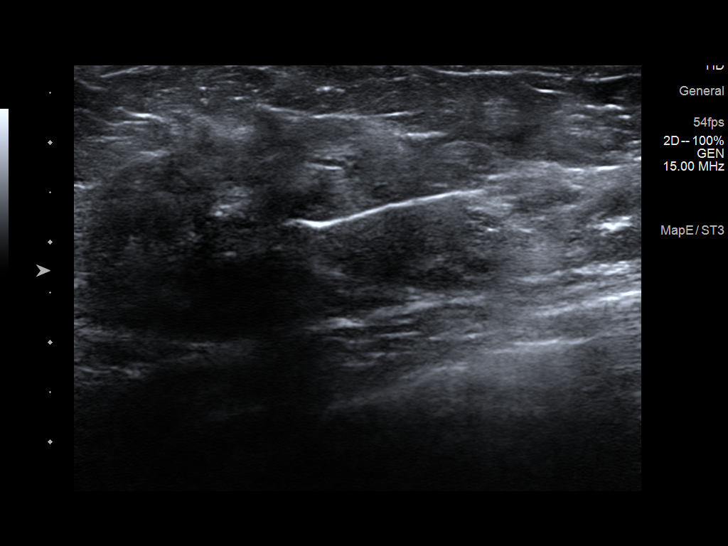
[im 4/19]
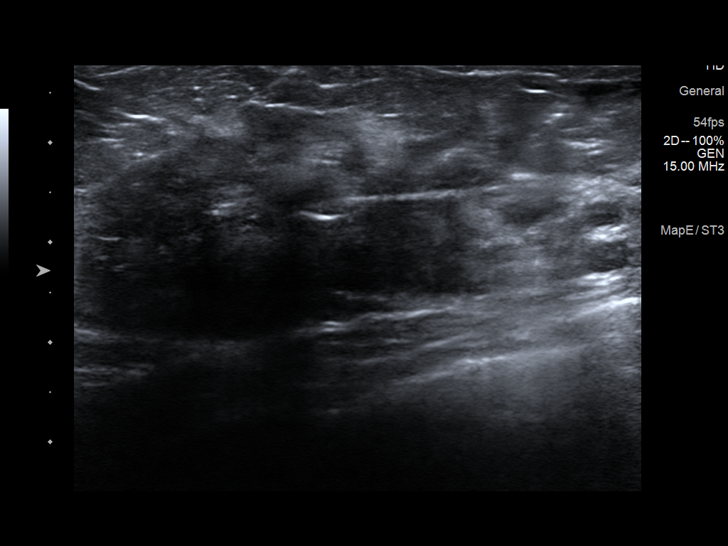
[im 6/19]
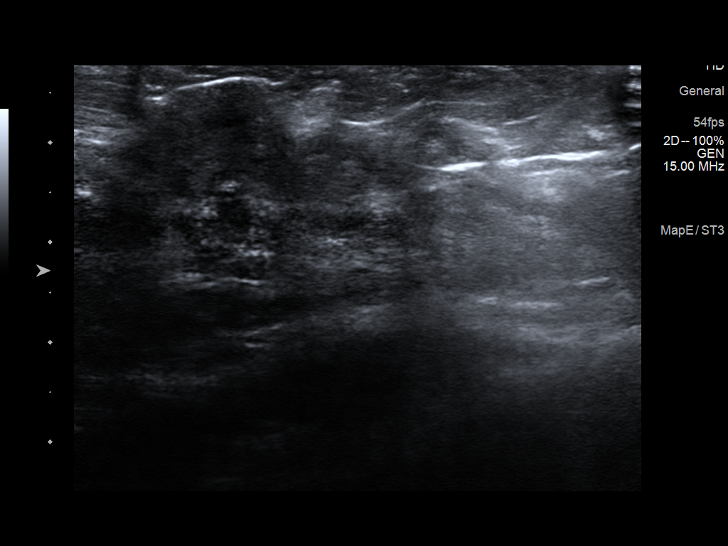
[im 8/19]
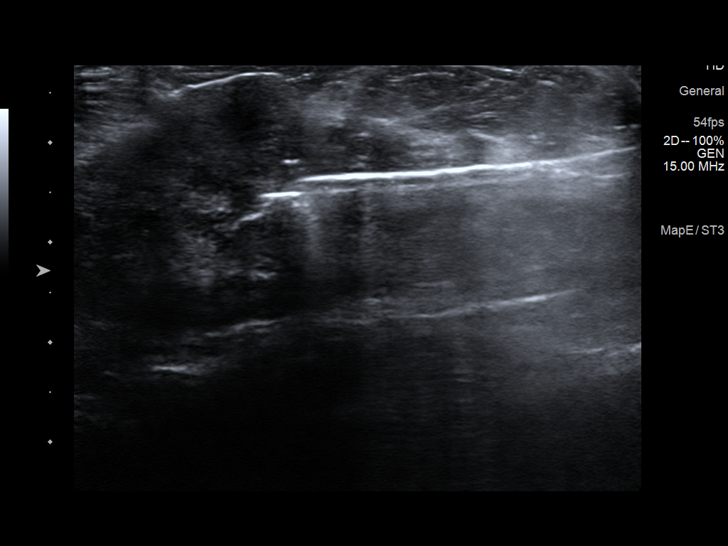
[im 9/19]
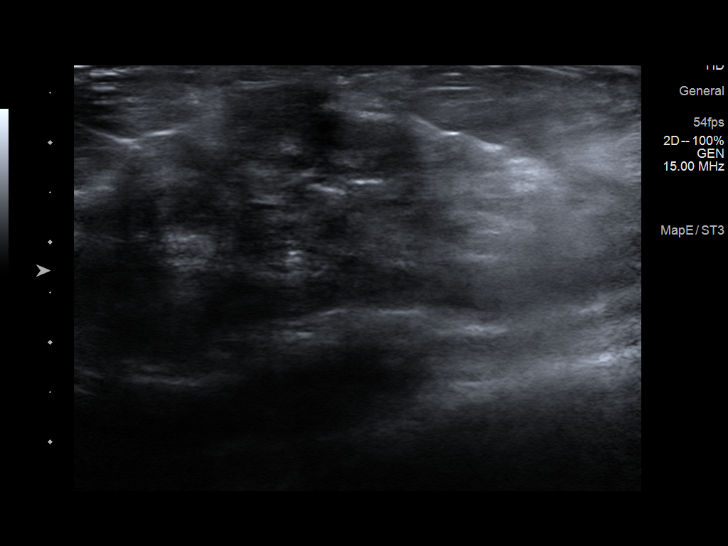
[im 11/19]
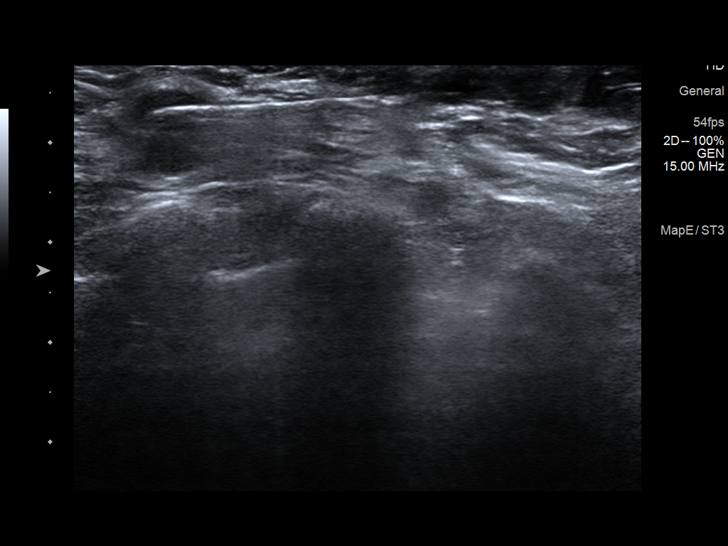
[im 12/19]
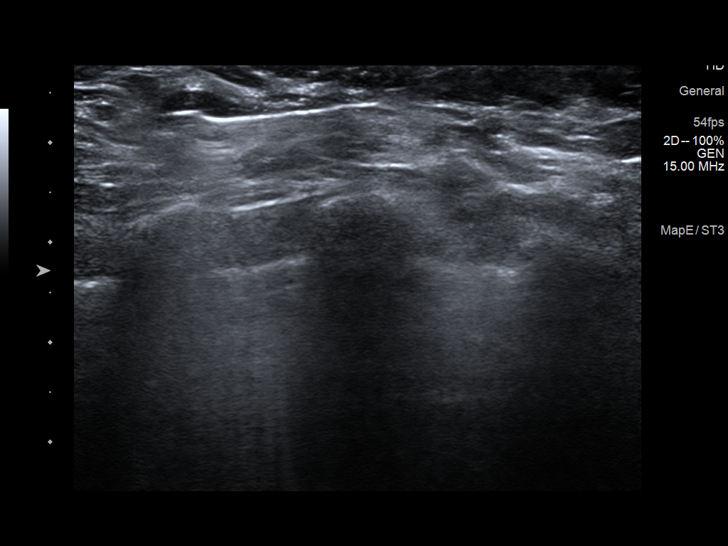
[im 14/19]
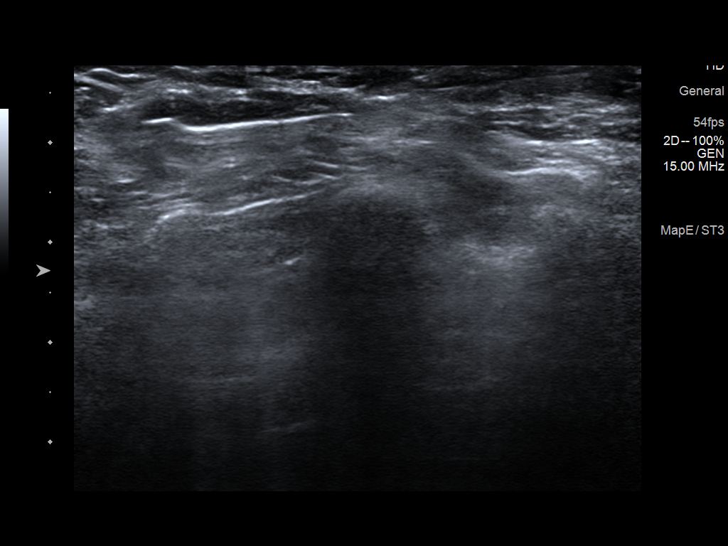
[im 16/19]
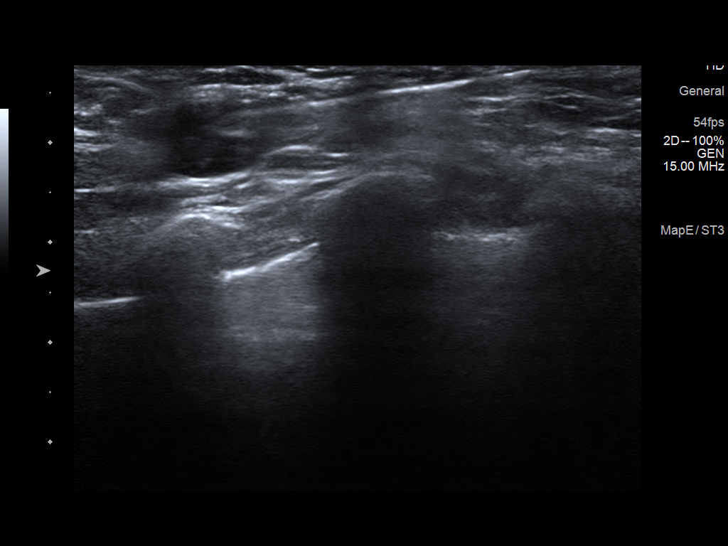
[im 17/19]
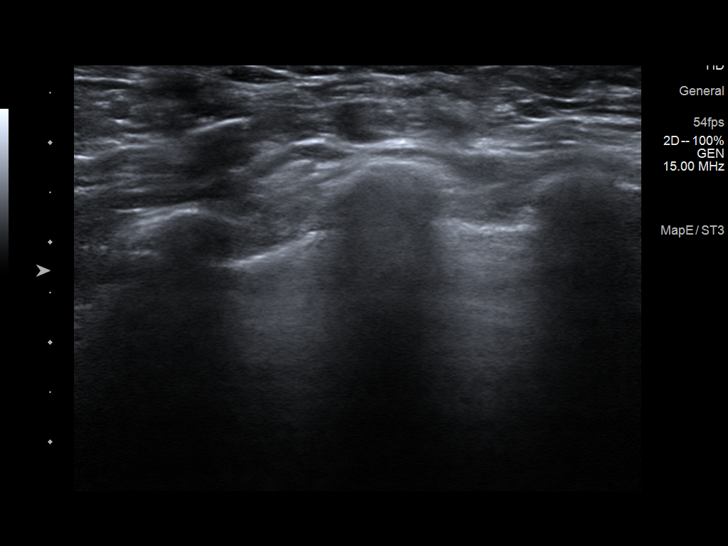
[im 19/19]
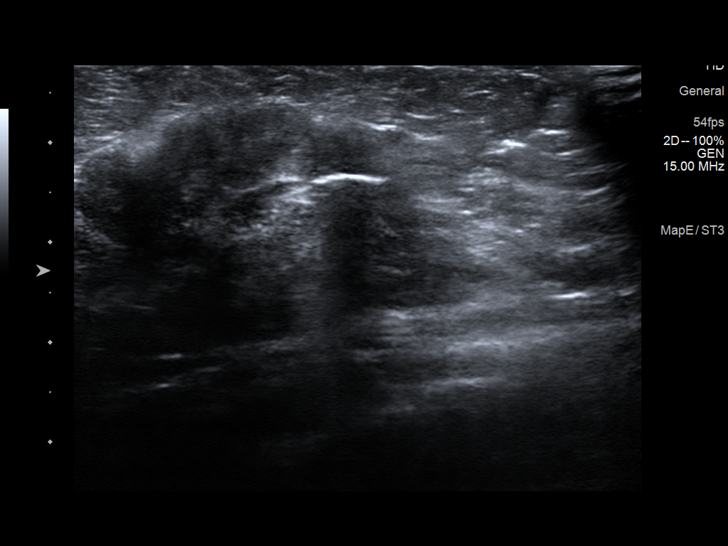

[12 of 19 positions shown; findings below may reference images not displayed]



SITE 1: LEFT BREAST 12 O'CLOCK RETROAREOLAR MASS: Lesion quadrant:
UPPER-OUTER

Using sterile technique and 1% Lidocaine as local anesthetic, under
direct ultrasound visualization, a 14 gauge BART device was
used to perform biopsy of the mass in the left breast at 12 o'clock
retroareolar using a lateral to medial approach. At the conclusion
of the procedure a ribbon shaped tissue marker clip was deployed
into the biopsy cavity. Follow up 2 view mammogram was performed and
dictated separately.

SITE 2: LEFT AXILLA LYMPH NODE: Lesion quadrant: UPPER-OUTER

Using sterile technique and 1% Lidocaine as local anesthetic, under
direct ultrasound visualization, a 14 gauge BART device was
used to perform biopsy of the lymph node with cortical thickening in
the left axilla using a inferolateral to superomedial approach. At
the conclusion of the procedure a tribell tissue marker clip was
deployed into the biopsy cavity. Follow up 2 view mammogram was
performed and dictated separately.
IMPRESSION: 1. Ultrasound-guided biopsy of the mass in the left breast at 12
o'clock retroareolar, at site of ribbon shaped biopsy marking clip.

2. Ultrasound-guided biopsy of the lymph node in the left axilla, at
site of tribell biopsy marking clip.

ADDENDUM:
Pathology revealed GRADE II INVASIVE MAMMARY CARCINOMA. Of the Left
breast, 12 o'clock subareolar, (ribbon clip). This was found to be
concordant by Dr. BART.

Pathology revealed METASTATIC CARCINOMA TO A LYMPH NODE of the Left
axilla. This was found to be concordant by Dr. BART.

Pathology results were discussed with the patient by telephone. The
patient reported doing well after the biopsies with tenderness at
the sites. Post biopsy instructions and care were reviewed and
questions were answered. The patient was encouraged to call The
direct phone number was provided.

The patient was referred to [REDACTED]
[REDACTED] at [REDACTED] on
[DATE].

Recommendation for a bilateral breast MRI given extremely dense
breast tissue, family history and age.

Pathology results reported by BART, RN on [DATE].



SITE 1: LEFT BREAST 12 O'CLOCK RETROAREOLAR MASS: Lesion quadrant:
UPPER-OUTER

Using sterile technique and 1% Lidocaine as local anesthetic, under
direct ultrasound visualization, a 14 gauge BART device was
used to perform biopsy of the mass in the left breast at 12 o'clock
retroareolar using a lateral to medial approach. At the conclusion
of the procedure a ribbon shaped tissue marker clip was deployed
into the biopsy cavity. Follow up 2 view mammogram was performed and
dictated separately.

SITE 2: LEFT AXILLA LYMPH NODE: Lesion quadrant: UPPER-OUTER

Using sterile technique and 1% Lidocaine as local anesthetic, under
direct ultrasound visualization, a 14 gauge BART device was
used to perform biopsy of the lymph node with cortical thickening in
the left axilla using a inferolateral to superomedial approach. At
the conclusion of the procedure a tribell tissue marker clip was
deployed into the biopsy cavity. Follow up 2 view mammogram was
performed and dictated separately.
IMPRESSION: 1. Ultrasound-guided biopsy of the mass in the left breast at 12
o'clock retroareolar, at site of ribbon shaped biopsy marking clip.

2. Ultrasound-guided biopsy of the lymph node in the left axilla, at
site of tribell biopsy marking clip.

## 2020-11-06 ENCOUNTER — Encounter: Payer: Self-pay | Admitting: *Deleted

## 2020-11-06 ENCOUNTER — Telehealth: Payer: Self-pay | Admitting: *Deleted

## 2020-11-06 NOTE — Telephone Encounter (Signed)
Confirmed [facility] for 11/11/20 at 0815 .  Instructions and contact information given.

## 2020-11-09 ENCOUNTER — Encounter: Payer: Self-pay | Admitting: *Deleted

## 2020-11-09 ENCOUNTER — Telehealth: Payer: Self-pay | Admitting: Adult Health

## 2020-11-09 ENCOUNTER — Encounter: Payer: Self-pay | Admitting: Registered Nurse

## 2020-11-09 NOTE — Telephone Encounter (Signed)
Called pt to let her know I was thinking of her and let me know if I can be of any assistance, but I know they will take good care of her.

## 2020-11-10 ENCOUNTER — Other Ambulatory Visit: Payer: Self-pay | Admitting: *Deleted

## 2020-11-10 ENCOUNTER — Encounter: Payer: Self-pay | Admitting: *Deleted

## 2020-11-10 DIAGNOSIS — C50412 Malignant neoplasm of upper-outer quadrant of left female breast: Secondary | ICD-10-CM | POA: Insufficient documentation

## 2020-11-10 DIAGNOSIS — Z170 Estrogen receptor positive status [ER+]: Secondary | ICD-10-CM | POA: Insufficient documentation

## 2020-11-11 ENCOUNTER — Encounter: Payer: Self-pay | Admitting: *Deleted

## 2020-11-11 ENCOUNTER — Ambulatory Visit
Admission: RE | Admit: 2020-11-11 | Discharge: 2020-11-11 | Disposition: A | Discharge status: Home / Self Care | Payer: BC Managed Care – PPO | Source: Ambulatory Visit | Attending: Radiation Oncology | Admitting: Radiation Oncology

## 2020-11-11 ENCOUNTER — Encounter: Payer: Self-pay | Admitting: Genetic Counselor

## 2020-11-11 ENCOUNTER — Other Ambulatory Visit: Payer: Self-pay

## 2020-11-11 ENCOUNTER — Inpatient Hospital Stay (HOSPITAL_BASED_OUTPATIENT_CLINIC_OR_DEPARTMENT_OTHER): Payer: BC Managed Care – PPO | Admitting: Hematology

## 2020-11-11 ENCOUNTER — Inpatient Hospital Stay (HOSPITAL_BASED_OUTPATIENT_CLINIC_OR_DEPARTMENT_OTHER): Payer: BC Managed Care – PPO | Admitting: Genetic Counselor

## 2020-11-11 ENCOUNTER — Other Ambulatory Visit: Payer: Self-pay | Admitting: *Deleted

## 2020-11-11 ENCOUNTER — Ambulatory Visit: Payer: BC Managed Care – PPO | Admitting: Registered Nurse

## 2020-11-11 ENCOUNTER — Encounter: Payer: Self-pay | Admitting: Hematology

## 2020-11-11 ENCOUNTER — Inpatient Hospital Stay: Payer: BC Managed Care – PPO | Attending: Hematology

## 2020-11-11 VITALS — BP 132/71 | HR 91 | Temp 98.5°F | Resp 18 | Ht 64.0 in | Wt 125.4 lb

## 2020-11-11 DIAGNOSIS — Z801 Family history of malignant neoplasm of trachea, bronchus and lung: Secondary | ICD-10-CM

## 2020-11-11 DIAGNOSIS — Z803 Family history of malignant neoplasm of breast: Secondary | ICD-10-CM

## 2020-11-11 DIAGNOSIS — C50412 Malignant neoplasm of upper-outer quadrant of left female breast: Secondary | ICD-10-CM

## 2020-11-11 DIAGNOSIS — Z8052 Family history of malignant neoplasm of bladder: Secondary | ICD-10-CM | POA: Diagnosis not present

## 2020-11-11 DIAGNOSIS — C773 Secondary and unspecified malignant neoplasm of axilla and upper limb lymph nodes: Secondary | ICD-10-CM | POA: Diagnosis not present

## 2020-11-11 DIAGNOSIS — Z170 Estrogen receptor positive status [ER+]: Secondary | ICD-10-CM

## 2020-11-11 DIAGNOSIS — C50912 Malignant neoplasm of unspecified site of left female breast: Secondary | ICD-10-CM

## 2020-11-11 DIAGNOSIS — Z87891 Personal history of nicotine dependence: Secondary | ICD-10-CM | POA: Insufficient documentation

## 2020-11-11 DIAGNOSIS — R922 Inconclusive mammogram: Secondary | ICD-10-CM | POA: Insufficient documentation

## 2020-11-11 HISTORY — DX: Family history of malignant neoplasm of breast: Z80.3

## 2020-11-11 HISTORY — DX: Malignant neoplasm of unspecified site of left female breast: C50.912

## 2020-11-11 LAB — CBC WITH DIFFERENTIAL (CANCER CENTER ONLY)
Abs Immature Granulocytes: 0.01 10*3/uL (ref 0.00–0.07)
Basophils Absolute: 0 10*3/uL (ref 0.0–0.1)
Basophils Relative: 0 %
Eosinophils Absolute: 0 10*3/uL (ref 0.0–0.5)
Eosinophils Relative: 1 %
HCT: 42.4 % (ref 36.0–46.0)
Hemoglobin: 14.5 g/dL (ref 12.0–15.0)
Immature Granulocytes: 0 %
Lymphocytes Relative: 33 %
Lymphs Abs: 1.5 10*3/uL (ref 0.7–4.0)
MCH: 30.7 pg (ref 26.0–34.0)
MCHC: 34.2 g/dL (ref 30.0–36.0)
MCV: 89.6 fL (ref 80.0–100.0)
Monocytes Absolute: 0.4 10*3/uL (ref 0.1–1.0)
Monocytes Relative: 8 %
Neutro Abs: 2.6 10*3/uL (ref 1.7–7.7)
Neutrophils Relative %: 58 %
Platelet Count: 238 10*3/uL (ref 150–400)
RBC: 4.73 MIL/uL (ref 3.87–5.11)
RDW: 12.8 % (ref 11.5–15.5)
WBC Count: 4.5 10*3/uL (ref 4.0–10.5)
nRBC: 0 % (ref 0.0–0.2)

## 2020-11-11 LAB — CMP (CANCER CENTER ONLY)
ALT: 12 U/L (ref 0–44)
AST: 13 U/L — ABNORMAL LOW (ref 15–41)
Albumin: 4.3 g/dL (ref 3.5–5.0)
Alkaline Phosphatase: 26 U/L — ABNORMAL LOW (ref 38–126)
Anion gap: 11 (ref 5–15)
BUN: 16 mg/dL (ref 6–20)
CO2: 23 mmol/L (ref 22–32)
Calcium: 9.3 mg/dL (ref 8.9–10.3)
Chloride: 107 mmol/L (ref 98–111)
Creatinine: 0.82 mg/dL (ref 0.44–1.00)
GFR, Estimated: >60 mL/min (ref >60)
Glucose, Bld: 124 mg/dL — ABNORMAL HIGH (ref 70–99)
Potassium: 3.9 mmol/L (ref 3.5–5.1)
Sodium: 141 mmol/L (ref 135–145)
Total Bilirubin: 0.4 mg/dL (ref 0.3–1.2)
Total Protein: 7.3 g/dL (ref 6.5–8.1)

## 2020-11-11 LAB — GENETIC SCREENING ORDER

## 2020-11-11 NOTE — Progress Notes (Signed)
[place] Work  Initial Assessment   Pamela Reyes is a 40 y.o. year old female accompanied by boyfriend Pamela Reyes. Clinical Social Work was referred by [facility] for assessment of psychosocial needs.   SDOH (Social Determinants of Health) assessments performed: Yes   Distress Screen completed: Yes ONCBCN DISTRESS SCREENING 11/11/2020  Screening Type Initial Screening  Distress experienced in past week (1-10) 7  Emotional problem type Nervousness/Anxiety;Adjusting to illness      Family/Social Information:  Housing Arrangement: patient lives with boyfriend  Pamela Reyes.  Family members/support persons in your life? Family and Friends/Colleagues. Patient has no family in the state other than her boyfriend. Most of her family are in [place] and are supportive from a distance. Patient reports that she and her boyfriend moved to [facility] last November, so they haven't had time to create a large social network here. However, her supervisor and coworkers are very supportive. Transportation concerns: no  Employment: Working full time at a [employer]. Income source: Employment Financial concerns: Yes, due to illness and/or loss of work during treatment Type of concern:  Patient reported concern about whether she will be able to receive disability through her job, and impact on future income during treatment. Food access concerns: no Religious or spiritual practice: not addressed Medication Concerns: no   Coping/ Adjustment to diagnosis: Patient understands treatment plan and what happens next? yes Concerns about diagnosis and/or treatment: Overwhelmed by information Patient reported stressors: Adjusting to my illness Patient enjoys being outside, time with family/ friends, and hiking and birdwatching. Current coping skills/ strengths: Ability for insight , Capable of independent living , Motivation for treatment/growth , and Supportive family/friends     SUMMARY: Current  SDOH Barriers:  None identified at this time.  Interventions: Discussed common feeling and emotions when being diagnosed with cancer, and the importance of support during treatment Informed patient of the support team roles and support services at [facility] Provided CSW contact information and encouraged patient to call with any questions or concerns   Follow Up Plan: Patient will contact CSW with any support or resource needs and get in touch if she is not granted disability through her work. Patient verbalizes understanding of plan: Yes   Rosary Lively, Social work English as a second language teacher by: Kennith [place] , LCSW

## 2020-11-11 NOTE — Progress Notes (Signed)
Radiation Oncology         [phone] ________________________________  Name: Pamela Reyes        MRN: [id number]  Date of Service: 11/11/2020 DOB: October 01, 1980  CC:Maximiano Coss, NP  Stark Klein, MD     REFERRING PHYSICIAN: Stark Klein, MD   DIAGNOSIS: The encounter diagnosis was Malignant neoplasm of upper-outer quadrant of left breast in female, estrogen receptor positive ([place]).   HISTORY OF PRESENT ILLNESS: Pamela Reyes is a 40 y.o. female seen in the multidisciplinary breast clinic for a new diagnosis of left breast cancer. The patient was noted to have a mass in the left breast. This measured 4.5 cm in the 12:00 position of the left breast. And she had one abnormal appearing left axillary node. A biopsy on 11/05/20 showed a grade 2 invasive ductal carcinoma. Her tumor was ER/PR positive, HER2 was equivocal by ICH and  pending FISH and her Ki 67 was 1%. She's seen today to discuss treatment recommendations of her cancer.  PREVIOUS RADIATION THERAPY: No   PAST MEDICAL HISTORY:  Past Medical History:  Diagnosis Date   Anxiety    Fibroid 10/27/2020   Has 1 x .9 x .6 cm subserosal fibroid   Peptic ulcer        PAST SURGICAL HISTORY:No past surgical history on file.   FAMILY HISTORY:  Family History  Problem Relation Age of Onset   Congestive Heart Failure Father    Breast cancer Sister    Breast cancer Maternal Aunt    Stroke Maternal Grandmother    Cancer Maternal Grandfather        lung   Lung cancer Maternal Grandfather        smoker   Breast cancer Paternal Grandmother      SOCIAL HISTORY:  reports that she has quit smoking. Her smoking use included cigarettes. She has never used smokeless tobacco. She reports current alcohol use. She reports that she does not use drugs. She is in a relationship with her fiance Gerald Stabs. She works for [employer] and lives in [place]. She's originally from [place]. She enjoys traveling and going to concerts.     ALLERGIES: Patient has no known allergies.   MEDICATIONS:  Current Outpatient Medications  Medication Sig Dispense Refill   JUNEL FE 1.5/30 1.5-30 MG-MCG tablet Take 1 tablet by mouth daily.     rosuvastatin (CRESTOR) 10 MG tablet Take 1 tablet (10 mg total) by mouth daily. 90 tablet 3   No current facility-administered medications for this visit.     REVIEW OF SYSTEMS: On review of systems, the patient reports that she is doing okay but is having some discomfort in the chest wall and breast since her biopsy. She is anxious about her diagnosis. No other complaints are verbalized.      PHYSICAL EXAM:  Wt Readings from Last 3 Encounters:  10/23/20 126 lb (57.2 kg)  10/06/20 129 lb (58.5 kg)   Temp Readings from Last 3 Encounters:  10/23/20 98.2 F (36.8 C) (Temporal)   BP Readings from Last 3 Encounters:  10/23/20 122/63  10/06/20 115/76   Pulse Readings from Last 3 Encounters:  10/23/20 80  10/06/20 92    In general this is a well appearing caucasian female in no acute distress. She's alert and oriented x4 and appropriate throughout the examination. Cardiopulmonary assessment is negative for acute distress and she exhibits normal effort. Bilateral breast exam is deferred.    ECOG = 1  0 -  Asymptomatic (Fully active, able to carry on all predisease activities without restriction)  1 - Symptomatic but completely ambulatory (Restricted in physically strenuous activity but ambulatory and able to carry out work of a light or sedentary nature. For example, light housework, office work)  2 - Symptomatic, <50% in bed during the day (Ambulatory and capable of all self care but unable to carry out any work activities. Up and about more than 50% of waking hours)  3 - Symptomatic, >50% in bed, but not bedbound (Capable of only limited self-care, confined to bed or chair 50% or more of waking hours)  4 - Bedbound (Completely disabled. Cannot carry on any self-care. Totally  confined to bed or chair)  5 - Death   Eustace Pen MM, Creech RH, Tormey DC, et al. [phone]). "Toxicity and response criteria of the [facility]". [place] Oncol. 5 (6): 649-55    LABORATORY DATA:  Lab Results  Component Value Date   WBC 5.2 10/23/2020   HGB 14.0 10/23/2020   HCT 42.2 10/23/2020   MCV 91.2 10/23/2020   PLT 291.0 10/23/2020   Lab Results  Component Value Date   NA 138 10/23/2020   K 4.3 10/23/2020   CL 103 10/23/2020   CO2 22 10/23/2020   Lab Results  Component Value Date   ALT 9 10/23/2020   AST 10 10/23/2020   ALKPHOS 24 (L) 10/23/2020   BILITOT 0.4 10/23/2020      RADIOGRAPHY: US BREAST LTD UNI LEFT INC AXILLA  Result Date: 10/28/2020 CLINICAL DATA:  40 year old female with a palpable area of concern in the left breast which she reports is slowly growing in size. History of benign MRI guided biopsy of the left breast in 2017. The patient does have a strong history of breast cancer, including in her sister having been diagnosed with breast cancer as well as a maternal aunt and paternal grandmother. EXAM: DIGITAL DIAGNOSTIC BILATERAL MAMMOGRAM WITH TOMOSYNTHESIS AND CAD; ULTRASOUND LEFT BREAST LIMITED TECHNIQUE: Bilateral digital diagnostic mammography and breast tomosynthesis was performed. The images were evaluated with computer-aided detection.; Targeted ultrasound examination of the left breast was performed. COMPARISON:  Previous exams. ACR Breast Density Category d: The breast tissue is extremely dense, which lowers the sensitivity of mammography. FINDINGS: There is masslike increased density predominantly involving the central left breast and corresponding to the palpable area of concern. The left breast appears progressively smaller with flattening of the left nipple. Initially questioned scattered calcifications in outer left breast are felt to be stable. Several lymph nodes are partially identified in the left axilla with possible  cortical thickening. No suspicious masses or calcifications seen in the right breast. Physical examination reveals a large firm masslike area involving the entire central left breast. Targeted ultrasound of the left breast was performed. There is a large ill-defined hypoechoic area/mass in the left breast at 12 o'clock retroareolar measuring 4.5 x 2.3 x 3.8 cm. This is felt to correspond the masslike density involving the central left breast. There is a lymph node identified with cortical thickening in the left axilla. IMPRESSION: 1. Suspicious palpable mass/hypoechoic area in the left breast at 12 o'clock retroareolar measuring 4.5 cm. 2.  Suspicious lymph node in the left axilla. RECOMMENDATION: 1. Recommend ultrasound-guided biopsy of the mass in the left breast at 12 o'clock retroareolar. 2. Recommend ultrasound-guided biopsy of the abnormal lymph node in the left axilla. I have discussed the findings and recommendations with the patient. If applicable, a reminder letter will  be sent to the patient regarding the next appointment. BI-RADS CATEGORY  4: Suspicious. Electronically Signed   By: Everlean Alstrom M.D.   On: 10/28/2020 11:52  MM DIAG BREAST TOMO BILATERAL  Result Date: 10/28/2020 CLINICAL DATA:  40 year old female with a palpable area of concern in the left breast which she reports is slowly growing in size. History of benign MRI guided biopsy of the left breast in 2017. The patient does have a strong history of breast cancer, including in her sister having been diagnosed with breast cancer as well as a maternal aunt and paternal grandmother. EXAM: DIGITAL DIAGNOSTIC BILATERAL MAMMOGRAM WITH TOMOSYNTHESIS AND CAD; ULTRASOUND LEFT BREAST LIMITED TECHNIQUE: Bilateral digital diagnostic mammography and breast tomosynthesis was performed. The images were evaluated with computer-aided detection.; Targeted ultrasound examination of the left breast was performed. COMPARISON:  Previous exams. ACR Breast  Density Category d: The breast tissue is extremely dense, which lowers the sensitivity of mammography. FINDINGS: There is masslike increased density predominantly involving the central left breast and corresponding to the palpable area of concern. The left breast appears progressively smaller with flattening of the left nipple. Initially questioned scattered calcifications in outer left breast are felt to be stable. Several lymph nodes are partially identified in the left axilla with possible cortical thickening. No suspicious masses or calcifications seen in the right breast. Physical examination reveals a large firm masslike area involving the entire central left breast. Targeted ultrasound of the left breast was performed. There is a large ill-defined hypoechoic area/mass in the left breast at 12 o'clock retroareolar measuring 4.5 x 2.3 x 3.8 cm. This is felt to correspond the masslike density involving the central left breast. There is a lymph node identified with cortical thickening in the left axilla. IMPRESSION: 1. Suspicious palpable mass/hypoechoic area in the left breast at 12 o'clock retroareolar measuring 4.5 cm. 2.  Suspicious lymph node in the left axilla. RECOMMENDATION: 1. Recommend ultrasound-guided biopsy of the mass in the left breast at 12 o'clock retroareolar. 2. Recommend ultrasound-guided biopsy of the abnormal lymph node in the left axilla. I have discussed the findings and recommendations with the patient. If applicable, a reminder letter will be sent to the patient regarding the next appointment. BI-RADS CATEGORY  4: Suspicious. Electronically Signed   By: Everlean Alstrom M.D.   On: 10/28/2020 11:52  [place] AXILLARY NODE CORE BIOPSY LEFT  Addendum Date: 11/10/2020   ADDENDUM REPORT: 11/06/2020 13:02 ADDENDUM: 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 breast, 12 o'clock subareolar, (ribbon clip). This was found to be concordant by Dr. Everlean Alstrom. Pathology  revealed METASTATIC CARCINOMA TO A LYMPH NODE of the Left axilla. This was found to be concordant by Dr. Everlean Alstrom. Pathology results were discussed with the patient by telephone. The patient reported doing well after the biopsies with tenderness at the sites. Post biopsy instructions and care were reviewed and questions were answered. The patient was encouraged to call [facility] for any additional concerns. My direct phone number was provided. The patient was referred to [facility] on November 11, 2020. Recommendation for a bilateral breast MRI given extremely dense breast tissue, family history and age. Pathology results reported by Terie Purser, RN on 11/06/2020. Electronically Signed   By: Everlean Alstrom M.D.   On: 11/06/2020 13:02   Result Date: 11/10/2020 CLINICAL DATA:  40 year old female with a suspicious palpable mass in the left  breast at 12 o'clock retroareolar and an indeterminate lymph node in the left axilla. EXAM: ULTRASOUND GUIDED LEFT BREAST CORE NEEDLE BIOPSY ULTRASOUND-GUIDED LEFT AXILLA CORE NEEDLE BIOPSY COMPARISON:  Previous exam(s). PROCEDURE: I met with the patient and we discussed the procedure of ultrasound-guided biopsy, including benefits and alternatives. We discussed the high likelihood of a successful procedure. We discussed the risks of the procedure, including infection, bleeding, tissue injury, clip migration, and inadequate sampling. Informed written consent was given. The usual time-out protocol was performed immediately prior to the procedure. SITE 1: LEFT BREAST 12 O'CLOCK RETROAREOLAR MASS: Lesion quadrant: UPPER-OUTER Using sterile technique and 1% Lidocaine as local anesthetic, under direct ultrasound visualization, a 14 gauge spring-loaded device was used to perform biopsy of the mass in the left breast at 12 o'clock retroareolar using a lateral to medial  approach. At the conclusion of the procedure a ribbon shaped tissue marker clip was deployed into the biopsy cavity. Follow up 2 view mammogram was performed and dictated separately. SITE 2: LEFT AXILLA LYMPH NODE: Lesion quadrant: UPPER-OUTER Using sterile technique and 1% Lidocaine as local anesthetic, under direct ultrasound visualization, a 14 gauge spring-loaded device was used to perform biopsy of the lymph node with cortical thickening in the left axilla using a inferolateral to superomedial approach. At the conclusion of the procedure a tribell tissue marker clip was deployed into the biopsy cavity. Follow up 2 view mammogram was performed and dictated separately. IMPRESSION: 1. Ultrasound-guided biopsy of the mass in the left breast at 12 o'clock retroareolar, at site of ribbon shaped biopsy marking clip. 2. Ultrasound-guided biopsy of the lymph node in the left axilla, at site of tribell biopsy marking clip. Electronically Signed: By: Everlean Alstrom M.D. On: 11/05/2020 08:49  US PELVIC COMPLETE WITH TRANSVAGINAL  Result Date: 11/01/2020 Images from the original result were not included.  [facility] [place] [address]                                                                                                    GYNECOLOGIC SONOGRAM Pamela Reyes is a 40 y.o. G0P0000 Patient's last menstrual period was 09/22/2020. She is here for a pelvic sonogram for left lower quadrant pain. Uterus                      6.6 x 3.7 x 4.2 cm, Total uterine volume 54 cc, homogeneous anteverted uterus,posterior right subserosal fibroid 1 x .6 x .9 cm Endometrium          4.1 mm, symmetrical, wnl Right ovary             1.9 x 1 x 1.8 cm, wnl Left ovary                2.6 x 1.3 x 2.1 cm, wnl No free fluid Technician Comments: PELVIC US TA/TV: homogeneous anteverted uterus,posterior right subserosal fibroid 1 x .6 x .9 cm,EEC 4.1 mm,normal ovaries,ovaries appear  mobile,no free fluid,no pain during ultrasound Chaperone peggy  [employer] 10/21/2020  4:54 PM Clinical Impression and recommendations: I have reviewed the sonogram results above, combined with the patient's current clinical course, below are my impressions and any appropriate recommendations for management based on the sonographic findings. Uterus is normal in size shape and contour for age and parity Endometrium is normal, thin for secretory phase of cycle Both ovaries are small for age but otherwise normal in shape and morphology Florian Buff 11/01/2020 9:45 PM  MM CLIP PLACEMENT LEFT  Result Date: 11/05/2020 CLINICAL DATA:  Post ultrasound-guided biopsy a suspicious palpable mass in the left breast at 12 o'clock subareolar and ultrasound-guided biopsy of a lymph node in the left axilla with cortical thickening. EXAM: 3D DIAGNOSTIC LEFT MAMMOGRAM POST ULTRASOUND BIOPSY COMPARISON:  Previous exam(s). FINDINGS: 3D Mammographic images were obtained following ultrasound-guided biopsy a suspicious palpable mass in the left breast at 12 o'clock subareolar and ultrasound-guided biopsy of a lymph node in the left axilla with cortical thickening. A ribbon shaped biopsy marking clip is present at the site of the biopsied mass in the left breast at 12 o'clock subareolar and a tribell biopsy marking clip is present at the site of the biopsied lymph node in the left axilla. IMPRESSION: 1. Ribbon shaped biopsy marking clip at site of the biopsied mass in the left breast at 12 o'clock subareolar. 2. Tribell biopsy marking clip at site of the biopsied lymph node in the left axilla. Final Assessment: Post Procedure Mammograms for Marker Placement Electronically Signed   By: Everlean Alstrom M.D.   On: 11/05/2020 09:00  [place] LT BREAST BX W LOC DEV 1ST LESION IMG BX SPEC US GUIDE  Addendum Date: 11/10/2020   ADDENDUM REPORT: 11/06/2020 13:02 ADDENDUM: 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 breast,  12 o'clock subareolar, (ribbon clip). This was found to be concordant by Dr. Everlean Alstrom. 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 This was found to be concordant by Dr. Everlean Alstrom. Pathology results were discussed with the patient by telephone. The patient reported doing well after the biopsies with tenderness at the sites. Post biopsy instructions and care were reviewed and questions were answered. The patient was encouraged to call The [facility] for any additional concerns. My direct phone number was provided. The patient was referred to [facility] on November 11, 2020. Recommendation for a bilateral breast MRI given extremely dense breast tissue, family history and age. Pathology results reported by Terie Purser, RN on 11/06/2020. Electronically Signed   By: Everlean Alstrom M.D.   On: 11/06/2020 13:02   Result Date: 11/10/2020 CLINICAL DATA:  40 year old female with a suspicious palpable mass in the left breast at 12 o'clock retroareolar and an indeterminate lymph node in the left axilla. EXAM: ULTRASOUND GUIDED LEFT BREAST CORE NEEDLE BIOPSY ULTRASOUND-GUIDED LEFT AXILLA CORE NEEDLE BIOPSY COMPARISON:  Previous exam(s). PROCEDURE: I met with the patient and we discussed the procedure of ultrasound-guided biopsy, including benefits and alternatives. We discussed the high likelihood of a successful procedure. We discussed the risks of the procedure, including infection, bleeding, tissue injury, clip migration, and inadequate sampling. Informed written consent was given. The usual time-out protocol was performed immediately prior to the procedure. SITE 1: LEFT BREAST 12 O'CLOCK RETROAREOLAR MASS: Lesion quadrant: UPPER-OUTER Using sterile technique and 1% Lidocaine as local anesthetic, under direct ultrasound visualization, a 14 gauge spring-loaded device was used to  perform biopsy of  the mass in the left breast at 12 o'clock retroareolar using a lateral to medial approach. At the conclusion of the procedure a ribbon shaped tissue marker clip was deployed into the biopsy cavity. Follow up 2 view mammogram was performed and dictated separately. SITE 2: LEFT AXILLA LYMPH NODE: Lesion quadrant: UPPER-OUTER Using sterile technique and 1% Lidocaine as local anesthetic, under direct ultrasound visualization, a 14 gauge spring-loaded device was used to perform biopsy of the lymph node with cortical thickening in the left axilla using a inferolateral to superomedial approach. At the conclusion of the procedure a tribell tissue marker clip was deployed into the biopsy cavity. Follow up 2 view mammogram was performed and dictated separately. IMPRESSION: 1. Ultrasound-guided biopsy of the mass in the left breast at 12 o'clock retroareolar, at site of ribbon shaped biopsy marking clip. 2. Ultrasound-guided biopsy of the lymph node in the left axilla, at site of tribell biopsy marking clip. Electronically Signed: By: Everlean Alstrom M.D. On: 11/05/2020 08:49      IMPRESSION/PLAN: 1. 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 Dr. Lisbeth Renshaw discusses the pathology findings and reviews the nature of node positive left breast disease. The consensus from the breast conference includes  CT staging and bone scan, as well as breast MRI for extent of disease. HER2 is pending currently which could influence either neoadjuvant versus  possibleadjuvant chemotherapy depending on Mammoprint testing on pathology. She would be better suited for mastectomy per Dr. Barry Dienes along with either targeted or axillary dissection depending on MRI. She would benefit from external radiotherapy to the left chest wall and regional nodes to reduce risks of local recurrence followed by antiestrogen therapy. We discussed the risks, benefits, short, and long term effects of  radiotherapy, as well as the curative intent, and the patient is interested in proceeding. Dr. Lisbeth Renshaw discusses the delivery and logistics of radiotherapy and anticipates a course of 6 1/2 weeks of radiotherapy to the left chest wall and regional nodes with deep inspiration breath hold technique. We will see her back a few weeks after surgery to discuss the simulation process and anticipate we starting radiotherapy about 4-6 weeks after surgery.  2. Possible genetic predisposition to malignancy. The patient is a candidate for genetic testing given her personal  history. She was offered referral and is interested in meeting today with genetic testing. 3. Contraceptive counseling. She will discontinue her oral contraceptives due to her hormone based cancer but is aware of the need to avoid pregnancy during radiotherapy. We will discuss testing needs as we get closer to the time of treatment.  In a visit lasting 60 minutes, greater than 50% of the time was spent face to face reviewing her case, as well as in preparation of, discussing, and coordinating the patient's care.  The above documentation reflects my direct findings during this shared patient visit. Please see the separate note by Dr. Lisbeth Renshaw on this date for the remainder of the patient's plan of care.    Carola Rhine, [facility]    **Disclaimer: This note was dictated with voice recognition software. Similar sounding words can inadvertently be transcribed and this note may contain transcription errors which may not have been corrected upon publication of note.**

## 2020-11-11 NOTE — Progress Notes (Signed)
[place]   [phone] Fax:([phone] [place] Note   Patient Care Team: Maximiano Coss, NP as PCP - General (Adult Health Nurse Practitioner) Mauro Kaufmann, RN as Oncology Nurse Navigator Rockwell Germany, RN as Oncology Nurse Navigator Stark Klein, MD as Consulting Physician (General Surgery) [name], MD as Consulting Physician (Hematology) Kyung Rudd, MD as Consulting Physician (Radiation Oncology) Estill Dooms, NP as Nurse Practitioner (Obstetrics and Gynecology)  Date of Service:  11/11/2020   CHIEF COMPLAINTS/PURPOSE OF CONSULTATION:  Left Breast Cancer, ER+  REFERRING PHYSICIAN:  Breast center    ASSESSMENT & PLAN:  Pamela Reyes is a 40 y.o. pre-menopausal female with no significant PMH  1. Malignant neoplasm of upper-outer quadrant of left breast , Stage IIA, c(T2N1), ER+/PR+/HER2 pending, Grade 1  -presented with palpable left breast mass, initially diagnosed as fibroadenoma in 2017. The mass has been grown significantly since 2017. Biopsy on 11/05/20 confirmed invasive ductal carcinoma, with metastatic involvement of one lymph node. --We discussed her imaging findings and the biopsy results in great details. I discussed that the Her2 results are still pending, and this will change her treatment recommendations. We will also move forward with breast MRI, as well as staging CT and bone scan to rule out any additional metastatic disease. -Given the size of her disease, she likely need a mastectomy. She is agreeable with that. She was seen by Dr. Barry Dienes today and likely will proceed with surgery soon, assuming her tumor is Her2-. -If HER2+, I recommended neoadjuvant TCHP for 6 cycles, and possible additional adjuvant Kadcyla depending on her response to neoadjuvant treatment. -If she is found to be Her2-, I recommend a Mammaprint test on the surgical sample and we'll make a decision about adjuvant chemotherapy based on  the Mammaprint result. Written material of this test was given to her. She is young and fit, would be a good candidate for chemotherapy if her Mammaprint is high risk.  However if her tumor is more than 5 cm or she has more than 3 positive lymph nodes, then adjuvant chemotherapy will be recommended woithout MammaPrint test. -Giving the strong ER and PR expression in her premenopausal status, I recommend adjuvant endocrine therapy with tamoxifen or aromatase inhibitor and ovarian suppression for a total of 5-10 years to reduce the risk of cancer recurrence. Potential benefits and side effects were discussed with patient in great details today and she is interested. -she does not plan to have children in future -She was also seen by radiation oncologist Dr. Lisbeth Renshaw today. If her surgical sentinel lymph nodes are negative, she would not need post mastectomy radiation.  -We also discussed the breast cancer surveillance after her surgery. She will continue annual screening mammogram, self exam, and a routine office visit with lab and exam with [place]. -I encouraged her to have healthy diet and exercise regularly.   2. Genetics -given her young age and strong family history of breast cancer, we recommend genetic testing. She agrees. -She reports her sister, who was diagnosed at age 9 and has since recovered, had negative genetic testing.   PLAN:  -await Her2 results to determine her treatment plan, plan for neoadjuvant chemo if HER2(+). -If HER2- she will proceed with mastectomy and targeted lymph node dissection, and Mammaprint on her surgical sample  -proceed with breast MRI -CT and bone scan to be done on 11/19/20   Oncology History Overview Note  Cancer Staging Malignant neoplasm of upper-outer quadrant of  left breast in female, estrogen receptor positive ([place]) Staging form: Breast, AJCC 8th Edition - Clinical stage from 11/05/2020: Stage IIA (cT2, cN1, cM0, G1, ER+, PR+, HER2: Equivocal) - Signed by  [name], MD on 11/10/2020    Malignant neoplasm of upper-outer quadrant of left breast in female, estrogen receptor positive ([place])  10/28/2020 Mammogram   Bilateral Diagnostic Mammogram; Left Breast Ultrasound  IMPRESSION: 1. Suspicious palpable mass/hypoechoic area in the left breast at 12 o'clock retroareolar measuring 4.5 cm.   2.  Suspicious lymph node in the left axilla.   11/05/2020 Cancer Staging   Staging form: Breast, AJCC 8th Edition - Clinical stage from 11/05/2020: Stage IIA (cT2, cN1, cM0, G1, ER+, PR+, HER2: Equivocal) - Signed by [name], MD on 11/10/2020 Stage prefix: Initial diagnosis Histologic grading system: 3 grade system   11/05/2020 Pathology Results   Diagnosis 1. Breast, left, needle core biopsy, 12 o'clock subareolar, ribbon clip - INVASIVE MAMMARY CARCINOMA. SEE NOTE 1. Carcinoma measures 1.5 cm in greatest linear dimension and appears grade 2. 1. Immunohistochemical stain for E-cadherin is positive in the tumor cells, consistent with a ductal phenotype. 1. PROGNOSTIC INDICATORS Results: The tumor cells are EQUIVOCAL for Her2 (2+). Her2 by FISH will be performed and results reported separately. Estrogen Receptor: 70%, POSITIVE, STRONG-MODERATE STAINING INTENSITY Progesterone Receptor: 80%, POSITIVE, MODERATE STAINING INTENSITY Proliferation Marker Ki67: 1%  2. Lymph node, needle/core biopsy, left axilla, tribell clip - METASTATIC CARCINOMA TO A LYMPH NODE. SEE NOTE 2. Largest contiguous focus of metastatic carcinoma measures 0.3 cm. 2. PROGNOSTIC INDICATORS Results: The tumor cells are EQUIVOCAL for Her2 (2+). Her2 by FISH will be performed and results reported separately. Estrogen Receptor: 90%, POSITIVE, STRONG STAINING INTENSITY Progesterone Receptor: 95%, POSITIVE, STRONG STAINING INTENSITY Proliferation Marker Ki67: 1%   11/10/2020 Initial Diagnosis   Malignant neoplasm of upper-outer quadrant of left breast in female, estrogen receptor positive  ([place])      HISTORY OF PRESENTING ILLNESS:  Pamela Reyes 40 y.o. female is a here because of breast cancer. The patient was referred by breast center. The patient presents to the clinic today accompanied by her fiance.   She presented with a growing, palpable left breast mass. The mass was initially noted in 2017, at which time she underwent biopsy showing fibroadenoma. She reports she had mammography again in 2020 but did not have biopsy. She notes she did not have a mammogram in 2021. She underwent bilateral diagnostic mammography and left breast ultrasonography on 10/28/20 showing: 4.5 cm palpable mass in left breast at 12 o'clock retroareolar; suspicious lymph node in left axilla.  Biopsy on 11/05/20 showed: invasive mammary carcinoma, e-cadherin positive, grade 2. Prognostic indicators significant for: estrogen receptor, 70% positive and progesterone receptor, 80% positive. Proliferation marker Ki67 at 1%. HER2 equivocal.  The biopsied lymph node, performed the same day, showed metastatic carcinoma. Prognostic indicators significant for: estrogen receptor, 90% positive and progesterone receptor, 95% positive. Proliferation marker Ki67 at 1%. HER2 equivocal.    Today the patient notes they felt/feeling prior/after... -she expressed concern today that the cancer has spread. She notes some pain to her left flank that is not so much bothersome as it is concerning to her. She describes it as in her ribcage-area and is an "ache-y" feeling. She also reports mild left-sided tingling/numbness to her hand and foot for the last few weeks. She denies any issues with fine motor functions. -she reports some weight loss, which she attributes to anxiety.   She has a PMHx of.... -  anxiety, not formally diagnosed -s/p wisdom teeth removal -possible stomach ulcer (not formally diagnosed)   Socially... -she works in a [employer] -She reports breast cancer in sister at age 42 (negative genetic testing), a  paternal aunt, and her paternal grandmother. She also notes lung cancer in her maternal grandfather and bladder cancer in a maternal uncle. -previously had 3-4 drinks per week for ~10-12 years. -previously smoked occasionally in her 35's.   GYN HISTORY  Menarchal: 40 years old LMP: 11/09/20 Contraceptive: currently on BCP, I advised her to discontinue. HRT: n/a GP:0   REVIEW OF SYSTEMS:    Constitutional: Denies fevers, chills or abnormal night sweats Eyes: Denies blurriness of vision, double vision or watery eyes Ears, nose, mouth, throat, and face: Denies mucositis or sore throat Respiratory: Denies cough, dyspnea or wheezes Cardiovascular: Denies palpitation, chest discomfort or lower extremity swelling Gastrointestinal:  Denies nausea, heartburn or change in bowel habits Skin: Denies abnormal skin rashes Lymphatics: Denies new lymphadenopathy or easy bruising Neurological:Denies new weaknesses, (+) tingling/numbness to left hand and foot Behavioral/Psych: (+) anxiety, but mood is overall stable All other systems were reviewed with the patient and are negative.   MEDICAL HISTORY:  Past Medical History:  Diagnosis Date   Anxiety    Breast cancer (HCC)    Fibroid 10/27/2020   Has 1 x .9 x .6 cm subserosal fibroid   Peptic ulcer     SURGICAL HISTORY: History reviewed. No pertinent surgical history.  SOCIAL HISTORY: Social History   Socioeconomic History   Marital status: Significant Other    Spouse name: Not on file   Number of children: 0   Years of education: Not on file   Highest education level: Not on file  Occupational History   Not on file  Tobacco Use   Smoking status: Former    Packs/day: 0.25    Years: 4.00    Pack years: 1.00    Types: Cigarettes    Quit date: 10/13/2001    Years since quitting: 19.0   Smokeless tobacco: Never   Tobacco comments:    I was a light smoker in my teens  Vaping Use   Vaping Use: Never used  Substance and Sexual  Activity   Alcohol use: Yes    Alcohol/week: 8.0 standard drinks    Types: 8 Shots of liquor per week    Comment: 3-4 times a week for 12 years   Drug use: Never   Sexual activity: Yes    Birth control/protection: Pill  Other Topics Concern   Not on file  Social History Narrative   Not on file   Social Determinants of Health   Financial Resource Strain: Low Risk    Difficulty of Paying Living Expenses: Not hard at all  Food Insecurity: No Food Insecurity   Worried About Programme researcher, broadcasting/film/video in the Last Year: Never true   Ran Out of Food in the Last Year: Never true  Transportation Needs: No Transportation Needs   Lack of Transportation (Medical): No   Lack of Transportation (Non-Medical): No  Physical Activity: Insufficiently Active   Days of Exercise per Week: 2 days   Minutes of Exercise per Session: 30 min  Stress: Stress Concern Present   Feeling of Stress : To some extent  Social Connections: Moderately Isolated   Frequency of Communication with Friends and Family: Twice a week   Frequency of Social Gatherings with Friends and Family: Twice a week   Attends Religious Services: Never  Active Member of Clubs or Organizations: No   Attends Archivist Meetings: Never   Marital Status: Living with partner  Intimate Partner Violence: Not At Risk   Fear of Current or Ex-Partner: No   Emotionally Abused: No   Physically Abused: No   Sexually Abused: No    FAMILY HISTORY: Family History  Problem Relation Age of Onset   Congestive Heart Failure Father    Alcohol abuse Father    Heart disease Father    Breast cancer Sister    Arthritis Sister    Cancer Sister    Breast cancer Maternal Aunt    Cancer Maternal Aunt    Bladder Cancer Maternal Uncle    Breast cancer Paternal Aunt    Cancer Paternal Uncle        bladder cancer   Stroke Maternal Grandmother    Cancer Maternal Grandfather        lung   Lung cancer Maternal Grandfather        smoker   Breast  cancer Paternal Grandmother    Cancer Paternal Grandmother        breast cancer    ALLERGIES:  has No Known Allergies.  MEDICATIONS:  Current Outpatient Medications  Medication Sig Dispense Refill   JUNEL FE 1.5/30 1.5-30 MG-MCG tablet Take 1 tablet by mouth daily.     No current facility-administered medications for this visit.    PHYSICAL EXAMINATION: ECOG PERFORMANCE STATUS: 0 - Asymptomatic  Vitals:   11/11/20 0901  BP: 132/71  Pulse: 91  Resp: 18  Temp: 98.5 F (36.9 C)  SpO2: 100%   Filed Weights   11/11/20 0901  Weight: 125 lb 6.4 oz (56.9 kg)    GENERAL:alert, no distress and comfortable SKIN: skin color, texture, turgor are normal, no rashes or significant lesions EYES: normal, Conjunctiva are pink and non-injected, sclera clear  NECK: supple, thyroid normal size, non-tender, without nodularity LYMPH:  no palpable lymphadenopathy in the cervical, axillary  LUNGS: clear to auscultation and percussion with normal breathing effort HEART: regular rate & rhythm and no murmurs and no lower extremity edema ABDOMEN:abdomen soft, non-tender and normal bowel sounds Musculoskeletal:no cyanosis of digits and no clubbing  NEURO: alert & oriented x 3 with fluent speech, no focal motor/sensory deficits BREAST: 5 x 4.5 cm mass in upper left breast, tenderness to left axilla from biopsy. No palpable mass, nodules or adenopathy bilaterally. Breast exam benign.  LABORATORY DATA:  I have reviewed the data as listed CBC Latest Ref Rng & Units 11/11/2020 10/23/2020  WBC 4.0 - 10.5 K/uL 4.5 5.2  Hemoglobin 12.0 - 15.0 g/dL 14.5 14.0  Hematocrit 36.0 - 46.0 % 42.4 42.2  Platelets 150 - 400 K/uL 238 291.0    CMP Latest Ref Rng & Units 11/11/2020 10/23/2020  Glucose 70 - 99 mg/dL 124(H) 86  BUN 6 - 20 mg/dL 16 16  Creatinine 0.44 - 1.00 mg/dL 0.82 0.72  Sodium 135 - 145 mmol/L 141 138  Potassium 3.5 - 5.1 mmol/L 3.9 4.3  Chloride 98 - 111 mmol/L 107 103  CO2 22 - 32 mmol/L 23  22  Calcium 8.9 - 10.3 mg/dL 9.3 9.6  Total Protein 6.5 - 8.1 g/dL 7.3 7.2  Total Bilirubin 0.3 - 1.2 mg/dL 0.4 0.4  Alkaline Phos 38 - 126 U/L 26(L) 24(L)  AST 15 - 41 U/L 13(L) 10  ALT 0 - 44 U/L 12 9     RADIOGRAPHIC STUDIES: I have personally reviewed the radiological images  as listed and agreed with the findings in the report. US BREAST LTD UNI LEFT INC AXILLA  Result Date: 10/28/2020 CLINICAL DATA:  40 year old female with a palpable area of concern in the left breast which she reports is slowly growing in size. History of benign MRI guided biopsy of the left breast in 2017. The patient does have a strong history of breast cancer, including in her sister having been diagnosed with breast cancer as well as a maternal aunt and paternal grandmother. EXAM: DIGITAL DIAGNOSTIC BILATERAL MAMMOGRAM WITH TOMOSYNTHESIS AND CAD; ULTRASOUND LEFT BREAST LIMITED TECHNIQUE: Bilateral digital diagnostic mammography and breast tomosynthesis was performed. The images were evaluated with computer-aided detection.; Targeted ultrasound examination of the left breast was performed. COMPARISON:  Previous exams. ACR Breast Density Category d: The breast tissue is extremely dense, which lowers the sensitivity of mammography. FINDINGS: There is masslike increased density predominantly involving the central left breast and corresponding to the palpable area of concern. The left breast appears progressively smaller with flattening of the left nipple. Initially questioned scattered calcifications in outer left breast are felt to be stable. Several lymph nodes are partially identified in the left axilla with possible cortical thickening. No suspicious masses or calcifications seen in the right breast. Physical examination reveals a large firm masslike area involving the entire central left breast. Targeted ultrasound of the left breast was performed. There is a large ill-defined hypoechoic area/mass in the left breast at 12  o'clock retroareolar measuring 4.5 x 2.3 x 3.8 cm. This is felt to correspond the masslike density involving the central left breast. There is a lymph node identified with cortical thickening in the left axilla. IMPRESSION: 1. Suspicious palpable mass/hypoechoic area in the left breast at 12 o'clock retroareolar measuring 4.5 cm. 2.  Suspicious lymph node in the left axilla. RECOMMENDATION: 1. Recommend ultrasound-guided biopsy of the mass in the left breast at 12 o'clock retroareolar. 2. Recommend ultrasound-guided biopsy of the abnormal lymph node in the left axilla. I have discussed the findings and recommendations with the patient. If applicable, a reminder letter will be sent to the patient regarding the next appointment. BI-RADS CATEGORY  4: Suspicious. Electronically Signed   By: Everlean Alstrom M.D.   On: 10/28/2020 11:52  MM DIAG BREAST TOMO BILATERAL  Result Date: 10/28/2020 CLINICAL DATA:  40 year old female with a palpable area of concern in the left breast which she reports is slowly growing in size. History of benign MRI guided biopsy of the left breast in 2017. The patient does have a strong history of breast cancer, including in her sister having been diagnosed with breast cancer as well as a maternal aunt and paternal grandmother. EXAM: DIGITAL DIAGNOSTIC BILATERAL MAMMOGRAM WITH TOMOSYNTHESIS AND CAD; ULTRASOUND LEFT BREAST LIMITED TECHNIQUE: Bilateral digital diagnostic mammography and breast tomosynthesis was performed. The images were evaluated with computer-aided detection.; Targeted ultrasound examination of the left breast was performed. COMPARISON:  Previous exams. ACR Breast Density Category d: The breast tissue is extremely dense, which lowers the sensitivity of mammography. FINDINGS: There is masslike increased density predominantly involving the central left breast and corresponding to the palpable area of concern. The left breast appears progressively smaller with flattening of the  left nipple. Initially questioned scattered calcifications in outer left breast are felt to be stable. Several lymph nodes are partially identified in the left axilla with possible cortical thickening. No suspicious masses or calcifications seen in the right breast. Physical examination reveals a large firm masslike area involving the entire central  left breast. Targeted ultrasound of the left breast was performed. There is a large ill-defined hypoechoic area/mass in the left breast at 12 o'clock retroareolar measuring 4.5 x 2.3 x 3.8 cm. This is felt to correspond the masslike density involving the central left breast. There is a lymph node identified with cortical thickening in the left axilla. IMPRESSION: 1. Suspicious palpable mass/hypoechoic area in the left breast at 12 o'clock retroareolar measuring 4.5 cm. 2.  Suspicious lymph node in the left axilla. RECOMMENDATION: 1. Recommend ultrasound-guided biopsy of the mass in the left breast at 12 o'clock retroareolar. 2. Recommend ultrasound-guided biopsy of the abnormal lymph node in the left axilla. I have discussed the findings and recommendations with the patient. If applicable, a reminder letter will be sent to the patient regarding the next appointment. BI-RADS CATEGORY  4: Suspicious. Electronically Signed   By: Everlean Alstrom M.D.   On: 10/28/2020 11:52  [place] AXILLARY NODE CORE BIOPSY LEFT  Addendum Date: 11/10/2020   ADDENDUM REPORT: 11/06/2020 13:02 ADDENDUM: 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 breast, 12 o'clock subareolar, (ribbon clip). This was found to be concordant by Dr. Everlean Alstrom. 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 This was found to be concordant by Dr. Everlean Alstrom. Pathology results were discussed with the patient by telephone. The patient reported doing well after the biopsies with tenderness at the sites. Post biopsy instructions and care were reviewed and  questions were answered. The patient was encouraged to call [facility] for any additional concerns. My direct phone number was provided. The patient was referred to [facility] on November 11, 2020. Recommendation for a bilateral breast MRI given extremely dense breast tissue, family history and age. Pathology results reported by Terie Purser, RN on 11/06/2020. Electronically Signed   By: Everlean Alstrom M.D.   On: 11/06/2020 13:02   Result Date: 11/10/2020 CLINICAL DATA:  40 year old female with a suspicious palpable mass in the left breast at 12 o'clock retroareolar and an indeterminate lymph node in the left axilla. EXAM: ULTRASOUND GUIDED LEFT BREAST CORE NEEDLE BIOPSY ULTRASOUND-GUIDED LEFT AXILLA CORE NEEDLE BIOPSY COMPARISON:  Previous exam(s). PROCEDURE: I met with the patient and we discussed the procedure of ultrasound-guided biopsy, including benefits and alternatives. We discussed the high likelihood of a successful procedure. We discussed the risks of the procedure, including infection, bleeding, tissue injury, clip migration, and inadequate sampling. Informed written consent was given. The usual time-out protocol was performed immediately prior to the procedure. SITE 1: LEFT BREAST 12 O'CLOCK RETROAREOLAR MASS: Lesion quadrant: UPPER-OUTER Using sterile technique and 1% Lidocaine as local anesthetic, under direct ultrasound visualization, a 14 gauge spring-loaded device was used to perform biopsy of the mass in the left breast at 12 o'clock retroareolar using a lateral to medial approach. At the conclusion of the procedure a ribbon shaped tissue marker clip was deployed into the biopsy cavity. Follow up 2 view mammogram was performed and dictated separately. SITE 2: LEFT AXILLA LYMPH NODE: Lesion quadrant: UPPER-OUTER Using sterile technique and 1% Lidocaine as local anesthetic, under direct  ultrasound visualization, a 14 gauge spring-loaded device was used to perform biopsy of the lymph node with cortical thickening in the left axilla using a inferolateral to superomedial approach. At the conclusion of the procedure a tribell tissue marker clip was deployed into the biopsy cavity. Follow up 2 view mammogram was  performed and dictated separately. IMPRESSION: 1. Ultrasound-guided biopsy of the mass in the left breast at 12 o'clock retroareolar, at site of ribbon shaped biopsy marking clip. 2. Ultrasound-guided biopsy of the lymph node in the left axilla, at site of tribell biopsy marking clip. Electronically Signed: By: Everlean Alstrom M.D. On: 11/05/2020 08:49  US PELVIC COMPLETE WITH TRANSVAGINAL  Result Date: 11/01/2020 Images from the original result were not included.  [facility] [address] [place] [address]                                                                                                    GYNECOLOGIC SONOGRAM Pamela Reyes is a 40 y.o. [id number] Patient's last menstrual period was 09/22/2020. She is here for a pelvic sonogram for left lower quadrant pain. Uterus                      6.6 x 3.7 x 4.2 cm, Total uterine volume 54 cc, homogeneous anteverted uterus,posterior right subserosal fibroid 1 x .6 x .9 cm Endometrium          4.1 mm, symmetrical, wnl Right ovary             1.9 x 1 x 1.8 cm, wnl Left ovary                2.6 x 1.3 x 2.1 cm, wnl No free fluid Technician Comments: PELVIC US TA/TV: homogeneous anteverted uterus,posterior right subserosal fibroid 1 x .6 x .9 cm,EEC 4.1 mm,normal ovaries,ovaries appear mobile,no free fluid,no pain during ultrasound Chaperone peggy  Silver Huguenin 10/21/2020 4:54 PM Clinical Impression and recommendations: I have reviewed the sonogram results above, combined with the patient's current clinical course, below are my impressions and any appropriate recommendations for management based on the  sonographic findings. Uterus is normal in size shape and contour for age and parity Endometrium is normal, thin for secretory phase of cycle Both ovaries are small for age but otherwise normal in shape and morphology Florian Buff 11/01/2020 9:45 PM  MM CLIP PLACEMENT LEFT  Result Date: 11/05/2020 CLINICAL DATA:  Post ultrasound-guided biopsy a suspicious palpable mass in the left breast at 12 o'clock subareolar and ultrasound-guided biopsy of a lymph node in the left axilla with cortical thickening. EXAM: 3D DIAGNOSTIC LEFT MAMMOGRAM POST ULTRASOUND BIOPSY COMPARISON:  Previous exam(s). FINDINGS: 3D Mammographic images were obtained following ultrasound-guided biopsy a suspicious palpable mass in the left breast at 12 o'clock subareolar and ultrasound-guided biopsy of a lymph node in the left axilla with cortical thickening. A ribbon shaped biopsy marking clip is present at the site of the biopsied mass in the left breast at 12 o'clock subareolar and a tribell biopsy marking clip is present at the site of the biopsied lymph node in the left axilla. IMPRESSION: 1. Ribbon shaped biopsy marking clip at site of the biopsied mass in the left breast at 12 o'clock subareolar. 2. Tribell biopsy marking clip at site of the biopsied lymph node in the left axilla. Final  Assessment: Post Procedure Mammograms for Marker Placement Electronically Signed   By: Everlean Alstrom M.D.   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 This was found to be concordant by Dr. Everlean Alstrom. 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 This was found to be concordant by Dr. Everlean Alstrom. Pathology results were discussed with the patient by telephone. The patient reported doing well  after the biopsies with tenderness at the sites. Post biopsy instructions and care were reviewed and questions were answered. The patient was encouraged to call [facility] for any additional concerns. My direct phone number was provided. The patient was referred to [facility] on November 11, 2020. Recommendation for a bilateral breast MRI given extremely dense breast tissue, family history and age. Pathology results reported by Terie Purser, RN on 11/06/2020. Electronically Signed   By: Everlean Alstrom M.D.   On: 11/06/2020 13:02   Result Date: 11/10/2020 CLINICAL DATA:  40 year old female with a suspicious palpable mass in the left breast at 12 o'clock retroareolar and an indeterminate lymph node in the left axilla. EXAM: ULTRASOUND GUIDED LEFT BREAST CORE NEEDLE BIOPSY ULTRASOUND-GUIDED LEFT AXILLA CORE NEEDLE BIOPSY COMPARISON:  Previous exam(s). PROCEDURE: I met with the patient and we discussed the procedure of ultrasound-guided biopsy, including benefits and alternatives. We discussed the high likelihood of a successful procedure. We discussed the risks of the procedure, including infection, bleeding, tissue injury, clip migration, and inadequate sampling. Informed written consent was given. The usual time-out protocol was performed immediately prior to the procedure. SITE 1: LEFT BREAST 12 O'CLOCK RETROAREOLAR MASS: Lesion quadrant: UPPER-OUTER Using sterile technique and 1% Lidocaine as local anesthetic, under direct ultrasound visualization, a 14 gauge spring-loaded device was used to perform biopsy of the mass in the left breast at 12 o'clock retroareolar using a lateral to medial approach. At the conclusion of the procedure a ribbon shaped tissue marker clip was deployed into the biopsy cavity. Follow up 2 view mammogram was performed and dictated separately. SITE 2: LEFT AXILLA LYMPH NODE: Lesion  quadrant: UPPER-OUTER Using sterile technique and 1% Lidocaine as local anesthetic, under direct ultrasound visualization, a 14 gauge spring-loaded device was used to perform biopsy of the lymph node with cortical thickening in the left axilla using a inferolateral to superomedial approach. At the conclusion of the procedure a tribell tissue marker clip was deployed into the biopsy cavity. Follow up 2 view mammogram was performed and dictated separately. IMPRESSION: 1. Ultrasound-guided biopsy of the mass in the left breast at 12 o'clock retroareolar, at site of ribbon shaped biopsy marking clip. 2. Ultrasound-guided biopsy of the lymph node in the left axilla, at site of tribell biopsy marking clip. Electronically Signed: By: Everlean Alstrom M.D. On: 11/05/2020 08:49    No orders of the defined types were placed in this encounter.   All questions were answered. The patient knows to call the clinic with any problems, questions or concerns. The total time spent in the appointment was 60 minutes.     [name], MD 11/11/2020 10:50 AM  I, Wilburn Mylar, am acting as scribe for [name], MD.   I have reviewed the above documentation  for accuracy and completeness, and I agree with the above.     

## 2020-11-11 NOTE — Progress Notes (Signed)
REFERRING PROVIDER: Nicholas Lose, MD [place],  [place] [id number]  PRIMARY PROVIDER:  Maximiano Coss, NP  PRIMARY REASON FOR VISIT:  Encounter Diagnoses  Name Primary?   Malignant neoplasm of upper-outer quadrant of left breast in female, estrogen receptor positive ([place]) Yes   Family history of breast cancer     HISTORY OF PRESENT ILLNESS:   Pamela Reyes, a 40 y.o. female, was seen for a [place] cancer genetics consultation during the breast multidisciplinary clinic at the request of Dr. Burr Medico due to a personal and family history of breast cancer.  Pamela Reyes presents to clinic today to discuss the possibility of a hereditary predisposition to cancer, to discuss genetic testing, and to further clarify her future cancer risks, as well as potential cancer risks for family members.   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 The preliminary treatment plan is pending.   CANCER HISTORY:  Oncology History Overview Note  Cancer Staging Malignant neoplasm of upper-outer quadrant of left breast in female, estrogen receptor positive ([place]) Staging form: Breast, AJCC 8th Edition - Clinical stage from 11/05/2020: Stage IIA (cT2, cN1, cM0, G1, ER+, PR+, HER2: Equivocal) - Signed by [name], MD on 11/10/2020    Malignant neoplasm of upper-outer quadrant of left breast in female, estrogen receptor positive ([place])  10/28/2020 Mammogram   Bilateral Diagnostic Mammogram; Left Breast Ultrasound  IMPRESSION: 1. Suspicious palpable mass/hypoechoic area in the left breast at 12 o'clock retroareolar measuring 4.5 cm.   2.  Suspicious lymph node in the left axilla.   11/05/2020 Cancer Staging   Staging form: Breast, AJCC 8th Edition - Clinical stage from 11/05/2020: Stage IIA (cT2, cN1, cM0, G1, ER+, PR+, HER2: Equivocal) - Signed by [name], MD on 11/10/2020 Stage prefix: Initial  diagnosis Histologic grading system: 3 grade system   11/05/2020 Pathology Results   Diagnosis 1. Breast, left, needle core biopsy, 12 o'clock subareolar, ribbon clip - INVASIVE MAMMARY CARCINOMA. SEE NOTE 1. Carcinoma measures 1.5 cm in greatest linear dimension and appears grade 2. 1. Immunohistochemical stain for E-cadherin is positive in the tumor cells, consistent with a ductal phenotype. 1. PROGNOSTIC INDICATORS Results: The tumor cells are EQUIVOCAL for Her2 (2+). Her2 by FISH will be performed and results reported separately. Estrogen Receptor: 70%, POSITIVE, STRONG-MODERATE STAINING INTENSITY Progesterone Receptor: 80%, POSITIVE, MODERATE STAINING INTENSITY Proliferation Marker Ki67: 1%  2. Lymph node, needle/core biopsy, left axilla, tribell clip - METASTATIC CARCINOMA TO A LYMPH NODE. SEE NOTE 2. Largest contiguous focus of metastatic carcinoma measures 0.3 cm. 2. PROGNOSTIC INDICATORS Results: The tumor cells are EQUIVOCAL for Her2 (2+). Her2 by FISH will be performed and results reported separately. Estrogen Receptor: 90%, POSITIVE, STRONG STAINING INTENSITY Progesterone Receptor: 95%, POSITIVE, STRONG STAINING INTENSITY Proliferation Marker Ki67: 1%   11/10/2020 Initial Diagnosis   Malignant neoplasm of upper-outer quadrant of left breast in female, estrogen receptor positive ([place])     RISK FACTORS:  Menarche was at age 36.  Nulliparous.  OCP use for approximately 11 years.  Ovaries intact: yes.  Hysterectomy: no.  Menopausal status: premenopausal.  HRT use: 0 years. Colonoscopy: no; not examined. Mammogram within the last year: yes. Up to date with pelvic exams: most recent PAP in 2020.   Past Medical History:  Diagnosis Date   Anxiety    Fibroid 10/27/2020   Has 1 x .9 x .6 cm subserosal fibroid   Peptic ulcer  No past surgical history on file.  Social History   Socioeconomic History   Marital status: Significant Other    Spouse name: Not on  file   Number of children: 0   Years of education: Not on file   Highest education level: Not on file  Occupational History   Not on file  Tobacco Use   Smoking status: Former    Packs/day: 0.25    Years: 4.00    Pack years: 1.00    Types: Cigarettes    Quit date: 10/13/2001    Years since quitting: 19.0   Smokeless tobacco: Never   Tobacco comments:    I was a light smoker in my teens  Vaping Use   Vaping Use: Never used  Substance and Sexual Activity   Alcohol use: Yes    Alcohol/week: 8.0 standard drinks    Types: 8 Shots of liquor per week    Comment: 2-3 times a week   Drug use: Never   Sexual activity: Yes    Birth control/protection: Pill  Other Topics Concern   Not on file  Social History Narrative   Not on file   Social Determinants of Health   Financial Resource Strain: Low Risk    Difficulty of Paying Living Expenses: Not hard at all  Food Insecurity: No Food Insecurity   Worried About Charity fundraiser in the Last Year: Never true   [place] in the Last Year: Never true  Transportation Needs: No Transportation Needs   Lack of Transportation (Medical): No   Lack of Transportation (Non-Medical): No  Physical Activity: Insufficiently Active   Days of Exercise per Week: 2 days   Minutes of Exercise per Session: 30 min  Stress: Stress Concern Present   Feeling of Stress : To some extent  Social Connections: Moderately Isolated   Frequency of Communication with Friends and Family: Twice a week   Frequency of Social Gatherings with Friends and Family: Twice a week   Attends Religious Services: Never   Marine scientist or Organizations: No   Attends Music therapist: Never   Marital Status: Living with partner     FAMILY HISTORY:  We obtained a detailed, 4-generation family history.  Significant diagnoses are listed below: Family History  Problem Relation Age of Onset   Breast cancer Sister 29       ER+   Bladder Cancer  Maternal Uncle        smoking hx   Cancer Maternal Uncle        kidney or prostate; dx after 61   Breast cancer Paternal Aunt        dx after 83; x2 paternal aunts   Lung cancer Maternal Grandfather        dx after 62; smoking hx   Breast cancer Paternal Grandmother        dx after 31     Pamela Reyes reported both sisters had negative genetic testing.  No reports were available for review today.   Pamela Reyes was unaware of previous family history of genetic testing for hereditary cancer risks besides that mentioned above. There is no reported Ashkenazi Jewish ancestry. There is no known consanguinity.  GENETIC COUNSELING ASSESSMENT: Pamela Reyes is a 40 y.o. female with a personal and family history of cancer which is somewhat suggestive of a hereditary cancer syndrome and predisposition to cancer given her age of diagnosis and the presence of related cancers in her family.  We, therefore, discussed and recommended the following at today's visit.   DISCUSSION: We discussed that 5 - 10% of cancer is hereditary, with most cases of hereditary breast cancer associated with mutations in BRCA1/2.  There are other genes that can be associated with hereditary breast cancer syndromes. Type of cancer risk and level of risk are gene-specific. We discussed that testing is beneficial for several reasons including knowing how to follow individuals after completing their treatment, identifying whether potential treatment options would be beneficial, and understanding if other family members could be at risk for cancer and allowing them to undergo genetic testing.   We reviewed the characteristics, features and inheritance patterns of hereditary cancer syndromes. We also discussed genetic testing, including the appropriate family members to test, the process of testing, insurance coverage and turn-around-time for results. We discussed the implications of a negative, positive and/or variant of uncertain significant  result. In order to get genetic test results in a timely manner so that Pamela Reyes can use these genetic test results for surgical decisions, we recommended Pamela Reyes pursue genetic testing for the [facility] BRCA Plus Panel.  The BRCAplus panel offered by [employer] and includes sequencing and deletion/duplication analysis for the following 8 genes: ATM, BRCA1, BRCA2, CDH1, CHEK2, PALB2, PTEN, and TP53.  Once complete, we recommend Ms. Dehaas pursue reflex genetic testing to a more comprehensive gene panel.   Pamela Reyes  was offered a common hereditary cancer panel (47 genes) and an expanded pan-cancer panel (77 genes). Pamela Reyes was informed of the benefits and limitations of each panel, including that expanded pan-cancer panels contain genes that do not have clear management guidelines at this point in time.  We also discussed that as the number of genes included on a panel increases, the chances of variants of uncertain significance increases.  After considering the benefits and limitations of each gene panel, Pamela Reyes  elected to have a common hereditary cancers panel through [facility].  The CustomNext-Cancer+RNAinsight panel offered by [facility] includes sequencing and rearrangement analysis for the following 47 genes:  APC, ATM, AXIN2, BARD1, BMPR1A, BRCA1, BRCA2, BRIP1, CDH1, CDK4, CDKN2A, CHEK2, DICER1, EPCAM, GREM1, HOXB13, MEN1, MLH1, MSH2, MSH3, MSH6, MUTYH, NBN, NF1, NF2, NTHL1, PALB2, PMS2, POLD1, POLE, PTEN, RAD51C, RAD51D, RECQL, RET, SDHA, SDHAF2, SDHB, SDHC, SDHD, SMAD4, SMARCA4, STK11, TP53, TSC1, TSC2, and VHL.  RNA data is routinely analyzed for use in variant interpretation for all genes.   Based on Pamela Reyes's personal history of breast cancer before age 58, she meets medical criteria for genetic testing. Despite that she meets criteria, she may still have an out of pocket cost. We discussed that if her out of pocket cost for testing is over $100, the laboratory  should contact her to discuss self-pay prices, patient pay assistance programs, if applicable, and other billing options.   PLAN: After considering the risks, benefits, and limitations, Pamela Reyes provided informed consent to pursue genetic testing and the blood sample was sent to [facility] for analysis of the CustomNext-Cancer +RNAinsight Panel. Results should be available within approximately 1-2 weeks' time, at which point they will be disclosed by telephone to Pamela Reyes, as will any additional recommendations warranted by these results. Pamela Reyes will receive a summary of her genetic counseling visit and a copy of her results once available. This information will also be available in Epic.   Lastly, we encouraged Pamela Reyes to remain in contact with cancer genetics annually so that we can continuously  update the family history and inform her of any changes in cancer genetics and testing that may be of benefit for this family.   Pamela Reyes questions were answered to her satisfaction today. Our contact information was provided should additional questions or concerns arise. Thank you for the referral and allowing [place] to share in the care of your patient.    M. Joette Catching, [place], [facility] Genetic Counselor [email or web address] (P) [phone]  The patient was seen for a total of 20 minutes in genetic counseling.  The patient was accompanied by her partner, Gerald Reyes. Drs. Magrinat, Lindi Adie and/or Burr Medico were available to discuss this case as needed.  _______________________________________________________________________ For Office Staff:  Number of people involved in session: 2 Was an Intern/ student involved with case: yes; [employer] intern, Raj Janus, observed this session

## 2020-11-17 ENCOUNTER — Encounter: Payer: Self-pay | Admitting: *Deleted

## 2020-11-17 ENCOUNTER — Telehealth: Payer: Self-pay | Admitting: *Deleted

## 2020-11-17 NOTE — Telephone Encounter (Signed)
Spoke to pt concerning [place] from 10.5.22. Denies questions or concerns regarding dx or treatment care plan. Discussed her2 negative results. Confirmed MRI/CT appts, Encourage pt to call with needs. Received verbal understanding.

## 2020-11-18 ENCOUNTER — Ambulatory Visit: Payer: BC Managed Care – PPO | Attending: Hematology

## 2020-11-18 ENCOUNTER — Other Ambulatory Visit: Payer: Self-pay

## 2020-11-18 DIAGNOSIS — R293 Abnormal posture: Secondary | ICD-10-CM | POA: Diagnosis not present

## 2020-11-18 DIAGNOSIS — M25511 Pain in right shoulder: Secondary | ICD-10-CM | POA: Diagnosis not present

## 2020-11-18 DIAGNOSIS — C50412 Malignant neoplasm of upper-outer quadrant of left female breast: Secondary | ICD-10-CM | POA: Diagnosis not present

## 2020-11-18 DIAGNOSIS — Z170 Estrogen receptor positive status [ER+]: Secondary | ICD-10-CM | POA: Insufficient documentation

## 2020-11-18 NOTE — Therapy (Signed)
[facility] @ [place], [place], [address] Phone: [phone]   Fax:  [phone]  Physical Therapy Evaluation  Patient Details  Name: Pamela Reyes MRN: [id number] Date of Birth: [birth year] Referring Provider (PT): Dr. Burr Medico   Encounter Date: 11/18/2020   PT End of Session - 11/18/20 0951     Visit Number 1    Number of Visits 2    Date for PT Re-Evaluation 05/19/21    PT Start Time 0904    PT Stop Time 0947    PT Time Calculation (min) 43 min    Activity Tolerance Patient tolerated treatment well    Behavior During Therapy [facility] for tasks assessed/performed             Past Medical History:  Diagnosis Date   Anxiety    Breast cancer ([place])    Family history of breast cancer 11/11/2020   Fibroid 10/27/2020   Has 1 x .9 x .6 cm subserosal fibroid   Peptic ulcer     No past surgical history on file.  There were no vitals filed for this visit.        [facility] PT Assessment - 11/18/20 0001       Assessment   Medical Diagnosis Left Breast Cancer    Referring Provider (PT) Dr. Burr Medico    Onset Date/Surgical Date 11/06/20    Hand Dominance Right    Prior Therapy no      Precautions   Precaution Comments Active CA      Restrictions   Weight Bearing Restrictions No      Balance Screen   Has the patient fallen in the past 6 months No    Has the patient had a decrease in activity level because of a fear of falling?  No    Is the patient reluctant to leave their home because of a fear of falling?  No      Home Environment   Living Environment Private residence    Living Arrangements Spouse/significant other    Available Help at Discharge Friend(s)    Type of Home Apartment      Prior Function   Level of Independence Independent    Vocation Full time employment    [employer], standing, moving books around all day    Leisure hiking, birdwatching      Cognition    Overall Cognitive Status Within Functional Limits for tasks assessed      Posture/Postural Control   Posture/Postural Control Postural limitations    Postural Limitations Rounded Shoulders;Forward head      AROM   Right Shoulder Extension 42 Degrees    Right Shoulder Flexion 145 Degrees    Right Shoulder ABduction 171 Degrees    Right Shoulder Internal Rotation 37 Degrees    Right Shoulder External Rotation 100 Degrees    Left Shoulder Extension 60 Degrees    Left Shoulder Flexion 155 Degrees    Left Shoulder ABduction 180 Degrees    Left Shoulder Internal Rotation 71 Degrees    Left Shoulder External Rotation 100 Degrees               LYMPHEDEMA/ONCOLOGY QUESTIONNAIRE - 11/18/20 0001       What other symptoms do you have   Are you Having Heaviness or Tightness No    Are you having Pain Yes   left side   Are you having pitting edema No    Is it  Hard or Difficult finding clothes that fit No    Do you have infections No    Is there Decreased scar mobility No      Right Upper Extremity Lymphedema   10 cm Proximal to Olecranon Process 24.6 cm    Olecranon Process 22.9 cm    10 cm Proximal to Ulnar Styloid Process 20.7 cm    Just Proximal to Ulnar Styloid Process 15.3 cm    At Base of 2nd Digit 6.4 cm      Left Upper Extremity Lymphedema   10 cm Proximal to Olecranon Process 24.4 cm    Olecranon Process 22.6 cm    10 cm Proximal to Ulnar Styloid Process 19.9 cm    Just Proximal to Ulnar Styloid Process 15.4 cm    At Base of 2nd Digit 6.4 cm             L-DEX FLOWSHEETS - 11/18/20 0900       L-DEX LYMPHEDEMA SCREENING   Measurement Type Unilateral    L-DEX MEASUREMENT EXTREMITY Upper Extremity    POSITION  Standing    DOMINANT SIDE Right    At Risk Side Left    BASELINE SCORE (UNILATERAL) -0.2                  Quick Dash - 11/18/20 0001     Open a tight or new jar No difficulty    Do heavy household chores (wash walls, wash floors) No  difficulty    Carry a shopping bag or briefcase No difficulty    Wash your back No difficulty    Use a knife to cut food No difficulty    Recreational activities in which you take some force or impact through your arm, shoulder, or hand (golf, hammering, tennis) No difficulty    During the past week, to what extent has your arm, shoulder or hand problem interfered with your normal social activities with family, friends, neighbors, or groups? Not at all    During the past week, to what extent has your arm, shoulder or hand problem limited your work or other regular daily activities Slightly    Arm, shoulder, or hand pain. Mild    Tingling (pins and needles) in your arm, shoulder, or hand Mild    Difficulty Sleeping No difficulty    DASH Score 6.82 %              Objective measurements completed on examination: See above findings.                PT Education - 11/18/20 0959     Education Details 4 post op exercises, L-Dex screens, ABC class, lymphedema precautions    Person(s) Educated Patient    Methods Explanation;Handout    Comprehension Verbalized understanding;Returned demonstration                 PT Long Term Goals - 11/18/20 0957       PT LONG TERM GOAL #1   Title Pt will restore left shulder ROM to pre surgery levels    Time 6    Period Months    Status New    Target Date 05/19/21             Breast Clinic Goals - 11/18/20 0956       Patient will be able to verbalize understanding of pertinent lymphedema risk reduction practices relevant to her diagnosis specifically related to skin care.   Baseline 1  Period Days    Status Achieved    Target Date 11/18/20      Patient will be able to return demonstrate and/or verbalize understanding of the post-op home exercise program related to regaining shoulder range of motion.   Time 1    Period Days    Status Achieved    Target Date 11/18/20      Patient will be able to verbalize  understanding of the importance of attending the postoperative After Breast Cancer Class for further lymphedema risk reduction education and therapeutic exercise.   Time 1    Period Weeks    Status New    Target Date 11/23/20                   Plan - 11/18/20 3300     Clinical Impression Statement Pt was here for pre-surgery screen. Breast Cancer determined to be IDC with LN involvement, ER, PR +, Her 2-.  She is pending a left mastectomy but does not yet have a date for surgery. Baseline measurements were taken today for shoulder ROM, circumference measures of arms and L Dex baseline.  She was instructed in 4 post op exercises, ABC class, every 3 month SOZO screens, and precautions for lymphedema.    Personal Factors and Comorbidities Comorbidity 1    Comorbidities intermittent Right shoulder pain    Stability/Clinical Decision Making Stable/Uncomplicated    Clinical Decision Making Low    Rehab Potential Excellent    PT Frequency 1x / week    PT Duration Other (comment)   6 mos   PT Treatment/Interventions Patient/family education;Therapeutic exercise    PT Next Visit Plan Reassess after surgery, schedule ABC, SOZO    PT Home Exercise Plan 4 post op exercises    Consulted and Agree with Plan of Care Patient            Patient will follow up at outpatient cancer rehab 3-4 weeks following surgery.  If the patient requires physical therapy at that time, a specific plan will be dictated and sent to the referring physician for approval. The patient was educated today on appropriate basic range of motion exercises to begin post operatively and the importance of attending the After Breast Cancer class following surgery.  Patient was educated today on lymphedema risk reduction practices as it pertains to recommendations that will benefit the patient immediately following surgery.  She verbalized good understanding.   The patient was assessed using the L-Dex machine today to produce a  lymphedema index baseline score. The patient will be reassessed on a regular basis (typically every 3 months) to obtain new L-Dex scores. If the score is > 6.5 points away from his/her baseline score indicating onset of subclinical lymphedema, it will be recommended to wear a compression garment for 4 weeks, 12 hours per day and then be reassessed. If the score continues to be > 6.5 points from baseline at reassessment, we will initiate lymphedema treatment. Assessing in this manner has a 95% rate of preventing clinically significant lymphedema.  Patient will benefit from skilled therapeutic intervention in order to improve the following deficits and impairments:  Decreased knowledge of precautions, Postural dysfunction  Visit Diagnosis: Malignant neoplasm of upper-outer quadrant of left breast in female, estrogen receptor positive ([place])  Abnormal posture     Problem List Patient Active Problem List   Diagnosis Date Noted   Family history of breast cancer 11/11/2020   Malignant neoplasm of upper-outer quadrant of left breast in female,  estrogen receptor positive ([place]) 11/10/2020   Fibroid 10/27/2020   LLQ pain 10/06/2020   Left breast mass 10/06/2020   Breast pain, left 10/06/2020    Claris Pong, PT 11/18/2020, 11:05 AM  [facility] @ [place], [place], [address] Phone: [phone]   Fax:  [phone]  Name: Pamela Reyes MRN: [id number] Date of Birth: 07/17/1980

## 2020-11-18 NOTE — Patient Instructions (Signed)
Patient was instructed today in a home exercise program today for post op shoulder range of motion. These included active assist shoulder flexion in sitting/supine, scapular retraction, wall walking with shoulder abduction, and hands behind head external rotation sitting/supine.  She was encouraged to do these twice a day, holding 3 seconds and repeating 5 times when permitted by her physician.

## 2020-11-19 ENCOUNTER — Ambulatory Visit (HOSPITAL_COMMUNITY)
Admission: RE | Admit: 2020-11-19 | Discharge: 2020-11-19 | Disposition: A | Discharge status: Home / Self Care | Payer: BC Managed Care – PPO | Source: Ambulatory Visit | Attending: General Surgery | Admitting: General Surgery

## 2020-11-19 ENCOUNTER — Encounter (HOSPITAL_COMMUNITY)
Admission: RE | Admit: 2020-11-19 | Discharge: 2020-11-19 | Disposition: A | Discharge status: Home / Self Care | Payer: BC Managed Care – PPO | Source: Ambulatory Visit | Attending: General Surgery | Admitting: General Surgery

## 2020-11-19 DIAGNOSIS — C50412 Malignant neoplasm of upper-outer quadrant of left female breast: Secondary | ICD-10-CM

## 2020-11-19 DIAGNOSIS — Z170 Estrogen receptor positive status [ER+]: Secondary | ICD-10-CM | POA: Insufficient documentation

## 2020-11-19 IMAGING — NM NM BONE WHOLE BODY
2 series · 2 of 2 positions shown · non-contrast
Comparison: CT chest abdomen pelvis [DATE].

CLINICAL DATA: Breast cancer staging.

EXAM:
NUCLEAR MEDICINE WHOLE BODY BONE SCAN
TECHNIQUE: Whole body anterior and posterior images were obtained approximately
3 hours after intravenous injection of radiopharmaceutical.
RADIOPHARMACEUTICALS:  21.8 mCi [RW] MDP IV

[Series 1: whole body · 2.66mm/px · 1 of 1 slices shown (1 of 2)]
[im 1/1]
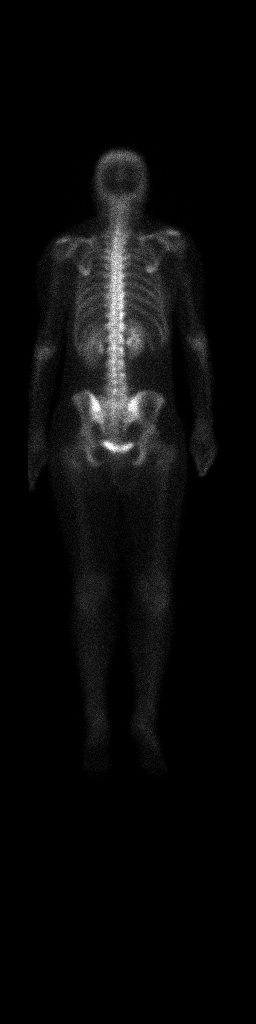

[Series 1: whole body · 2.66mm/px · 1 of 1 slices shown (2 of 2)]
[im 1/1]
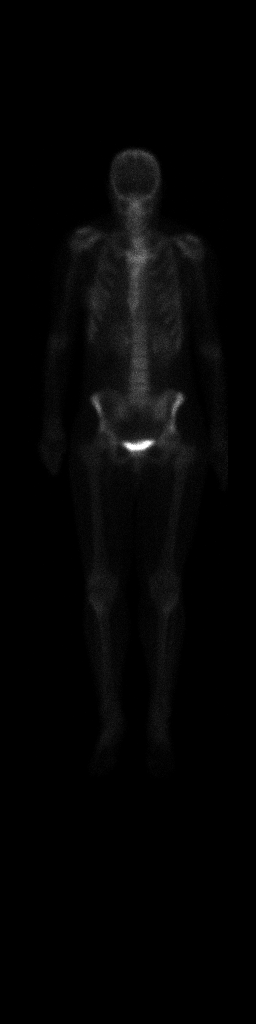

[2 of 2 positions shown; findings below may reference images not displayed]

FINDINGS: No abnormal uptake in the axial or appendicular skeleton.
IMPRESSION: No evidence of metastatic disease.

## 2020-11-19 IMAGING — CT CT ABD-PELV W/ CM
2 of 5 series · 12 of 36 positions shown, 15 images · IV contrast (OMNIPAQUE)
Comparison: None.

CLINICAL DATA: Breast carcinoma, staging

EXAM:
CT CHEST, ABDOMEN, AND PELVIS WITH CONTRAST
TECHNIQUE: Multidetector CT imaging of the chest, abdomen and pelvis was
performed following the standard protocol during bolus
administration of intravenous contrast.
CONTRAST:  80mL OMNIPAQUE IOHEXOL 350 MG/ML SOLN

[Series 2: cap with · axial · 0.66mm/px · z∈[-818,-288]mm · 9 of 134 slices shown, 12 images]
[im 14/134  mediastinal]
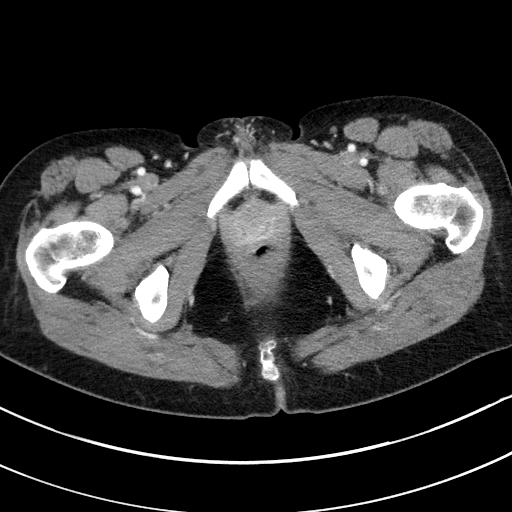
[im 14/134  lung]
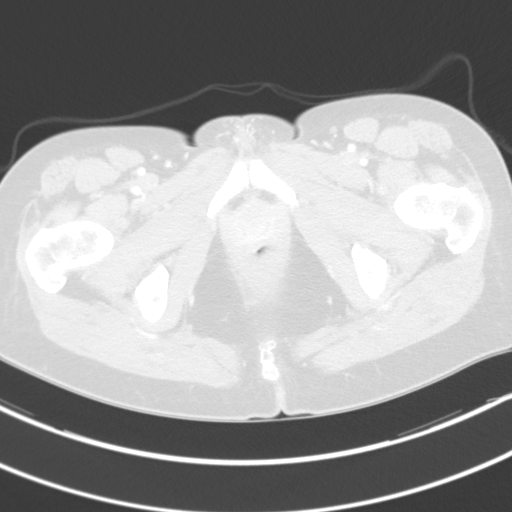
[im 27/134  lung]
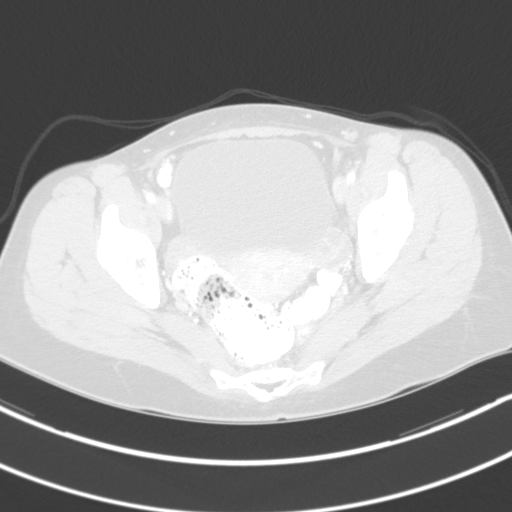
[im 40/134  lung]
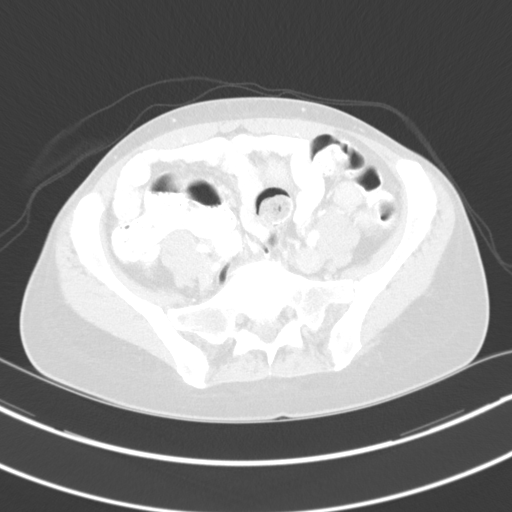
[im 54/134  lung]
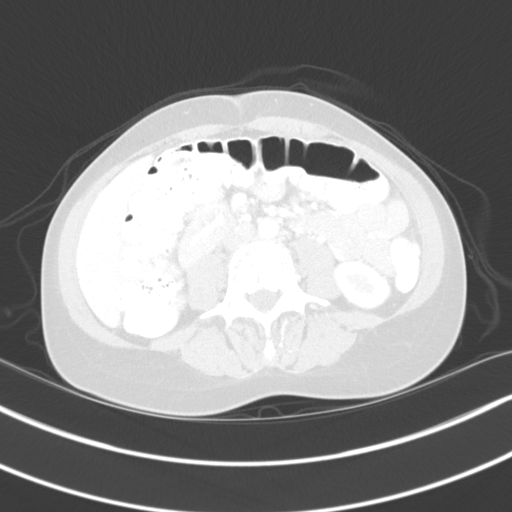
[im 67/134  mediastinal]
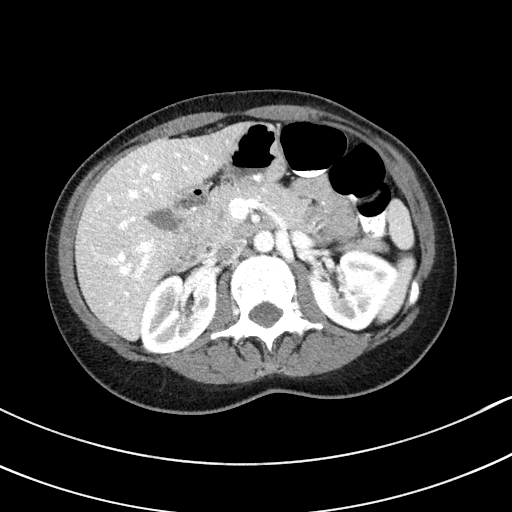
[im 67/134  lung]
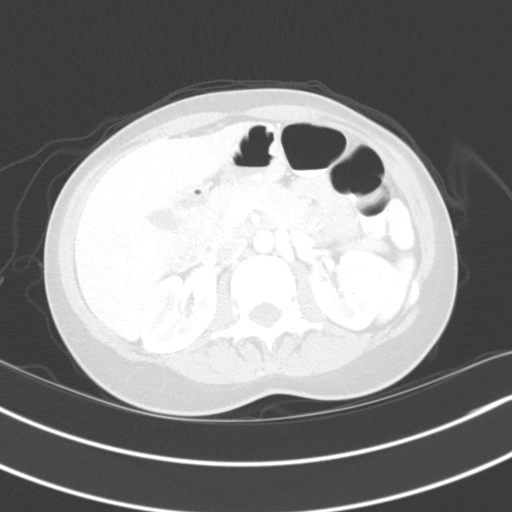
[im 80/134  lung]
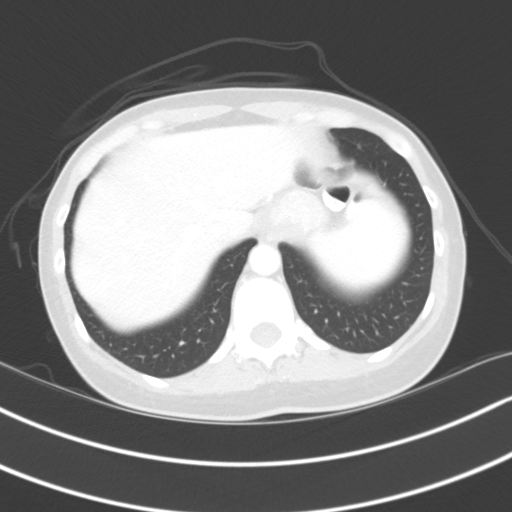
[im 94/134  lung]
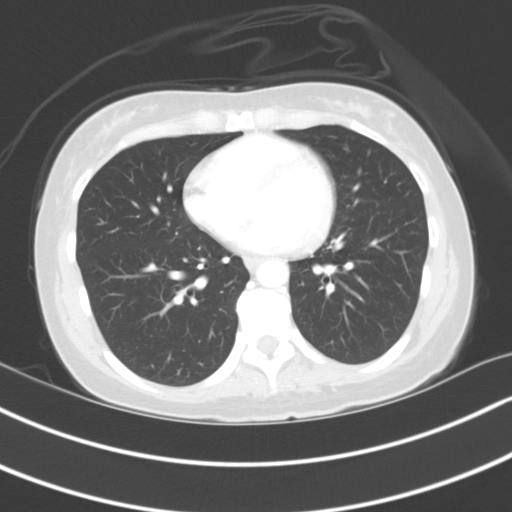
[im 107/134  lung]
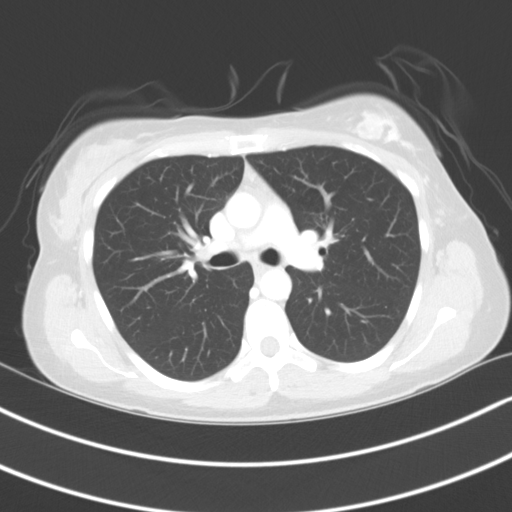
[im 120/134  mediastinal]
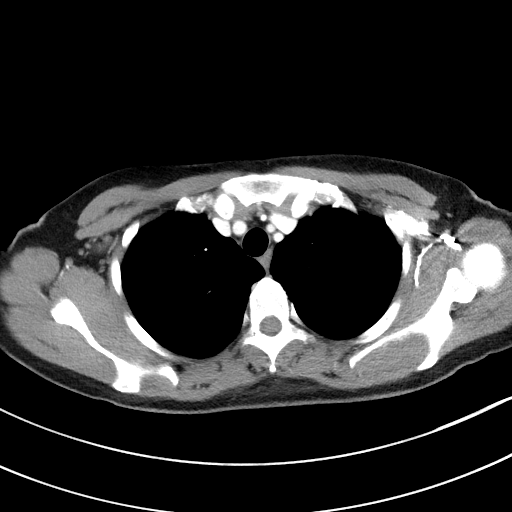
[im 120/134  lung]
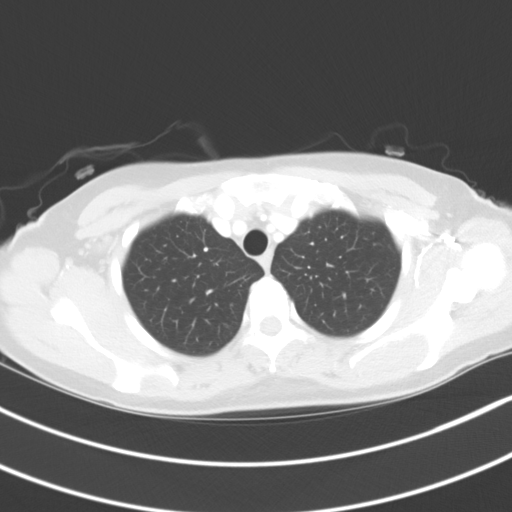

[Series 5: coronals · coronal · 0.63mm/px · 3 of 119 slices shown]
[im 24/119  lung]
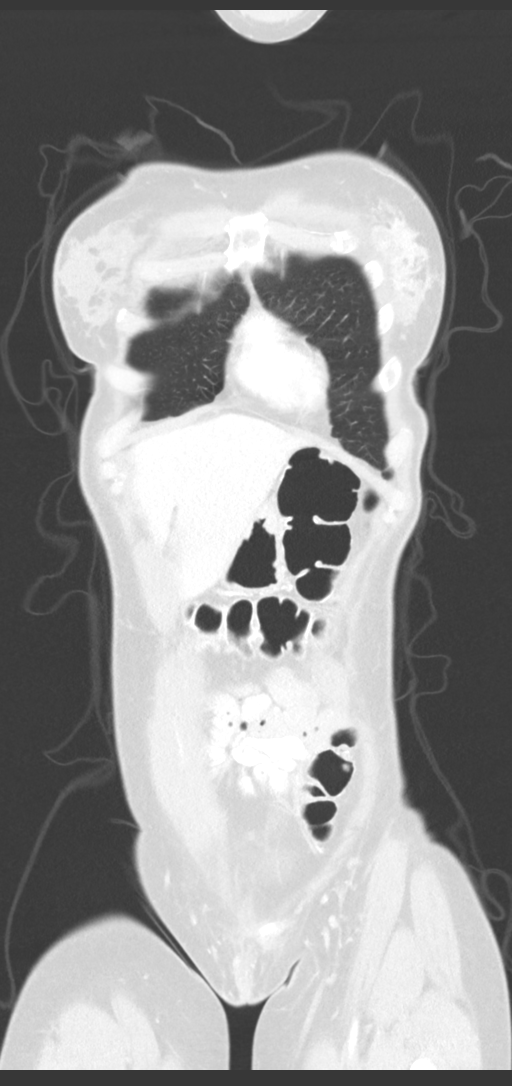
[im 48/119  lung]
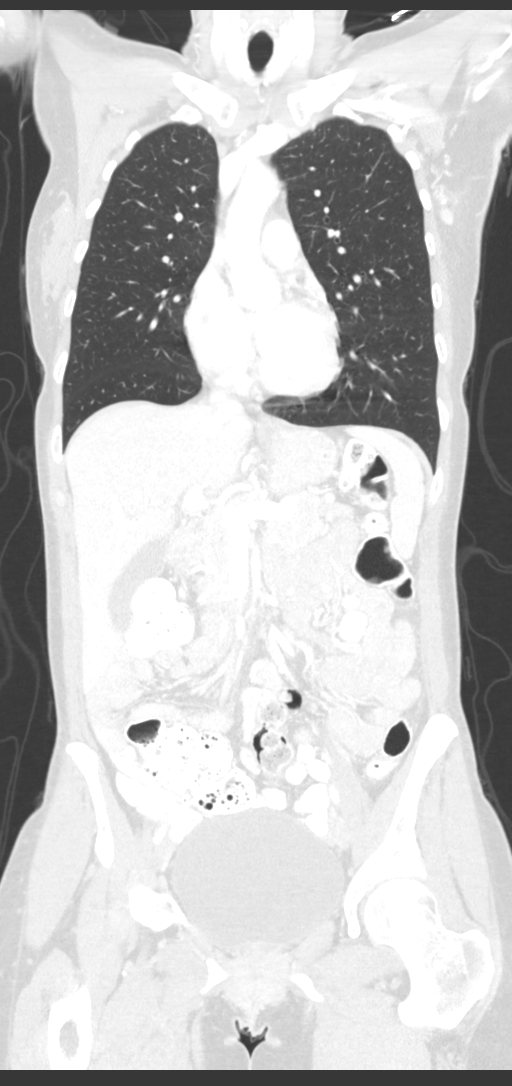
[im 71/119  lung]
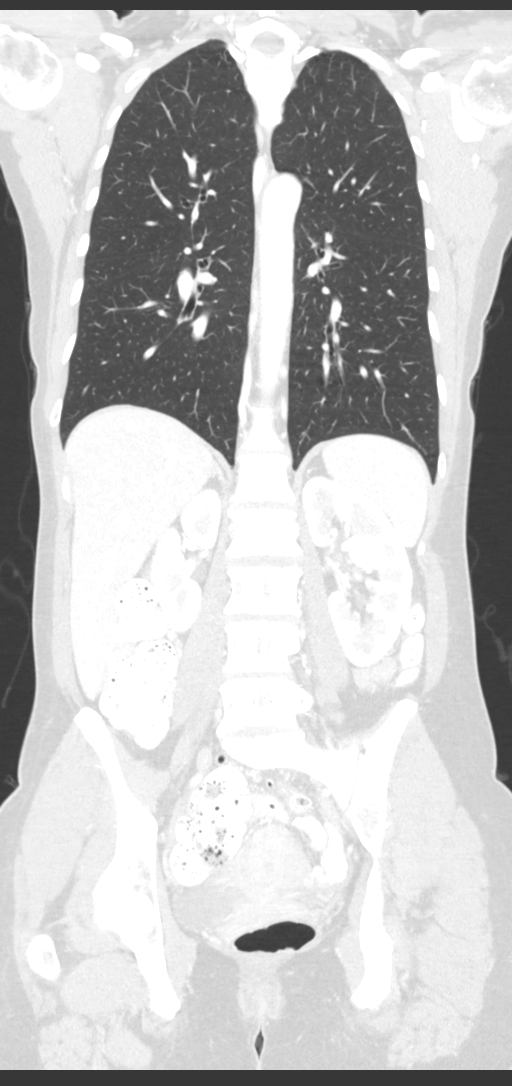

[12 of 36 positions shown; findings below may reference images not displayed]

FINDINGS: CT CHEST FINDINGS

Cardiovascular: Heart size normal.  No pericardial effusion.

Mediastinum/Nodes: Small hiatal hernia. No mediastinal or hilar
adenopathy. Enhancing left axillary nodes up to 0.9 cm.

Lungs/Pleura: No pleural effusion. No pneumothorax. Lungs are clear.

Musculoskeletal: Left breast mass, as described on previous
mammographic studies. No fracture or worrisome bone lesion.

CT ABDOMEN PELVIS FINDINGS

Hepatobiliary: No focal liver abnormality is seen. No gallstones,
gallbladder wall thickening, or biliary dilatation.

Pancreas: Unremarkable. No pancreatic ductal dilatation or
surrounding inflammatory changes.

Spleen: Normal in size without focal abnormality.

Adrenals/Urinary Tract: Adrenal glands and kidneys unremarkable.
Urinary bladder is distended.

Stomach/Bowel: Small hiatal hernia. Stomach is decompressed. Small
bowel nondistended. Normal appendix. Good contrast opacification of
the colon, which is unremarkable.

Vascular/Lymphatic: No abdominal or pelvic adenopathy. No
significant atheromatous change. Portal vein patent.

Reproductive: Uterus and bilateral adnexa are unremarkable.

Other: No ascites.  No free air.

Musculoskeletal: No acute or significant osseous findings.
IMPRESSION: 1. Left breast mass  as previously described.
2. Enhancing subcentimeter left axillary lymph nodes.
3. No evidence of distal metastatic disease.
4. Small hiatal hernia

## 2020-11-19 MED ORDER — TECHNETIUM TC 99M MEDRONATE IV KIT
20.0000 | PACK | Freq: Once | INTRAVENOUS | Status: AC | PRN
  Administered 2020-11-19: 21.8 via INTRAVENOUS

## 2020-11-19 MED ORDER — IOHEXOL 350 MG/ML SOLN
80.0000 mL | Freq: Once | INTRAVENOUS | Status: AC | PRN
  Administered 2020-11-19: 80 mL via INTRAVENOUS

## 2020-11-21 ENCOUNTER — Ambulatory Visit
Admission: RE | Admit: 2020-11-21 | Discharge: 2020-11-21 | Disposition: A | Discharge status: Home / Self Care | Payer: BC Managed Care – PPO | Source: Ambulatory Visit | Attending: General Surgery | Admitting: General Surgery

## 2020-11-21 ENCOUNTER — Other Ambulatory Visit: Payer: Self-pay

## 2020-11-21 DIAGNOSIS — Z170 Estrogen receptor positive status [ER+]: Secondary | ICD-10-CM

## 2020-11-21 DIAGNOSIS — C50412 Malignant neoplasm of upper-outer quadrant of left female breast: Secondary | ICD-10-CM

## 2020-11-21 IMAGING — MR MR BREAST BILAT WO/W CM
8 of 13 series · 31 of 48 positions shown · IV contrast (6ml gadavist)
Comparison: Previous exam(s).

CLINICAL DATA: Recent diagnosis of grade 3 invasive mammary
carcinoma in the 12 o'clock location of the LEFT breast with
metastatic carcinoma to LEFT axillary lymph node.

LABS:  None obtained at the time of imaging.
EXAM:
BILATERAL BREAST MRI WITH AND WITHOUT CONTRAST
TECHNIQUE: Multiplanar, multisequence MR images of both breasts were obtained
prior to and following the intravenous administration of 6 ml of
Gadavist

[Series 3: t2_tirm_tra ipat (a-p) · axial · 3.0mm · 0.70mm/px · 1 of 55 slices shown]
[im 1/55]
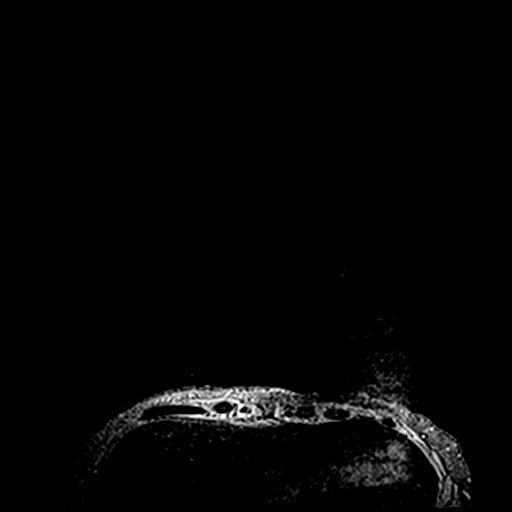

[Series 4: fl3d pre-cm no · axial · non-contrast · 1.2mm · 0.94mm/px · z∈[-83,+89]mm · 5 of 144 slices shown]
[im 1/144]
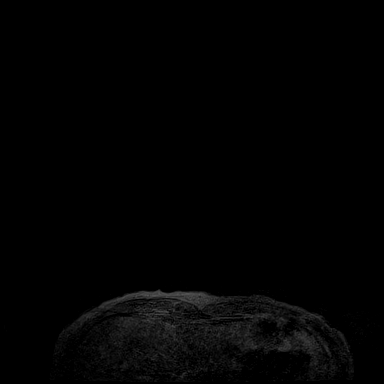
[im 36/144]
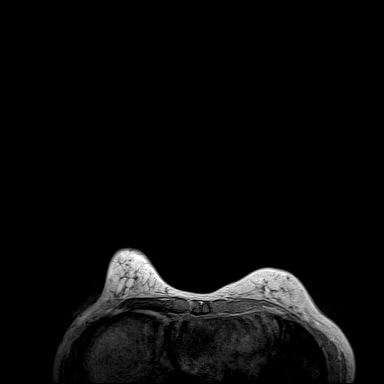
[im 72/144]
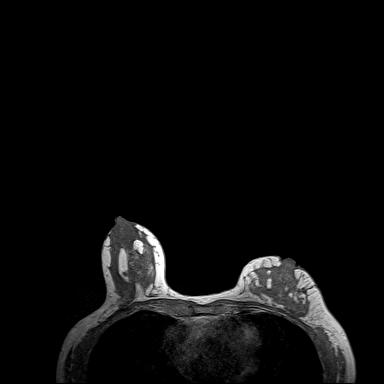
[im 108/144]
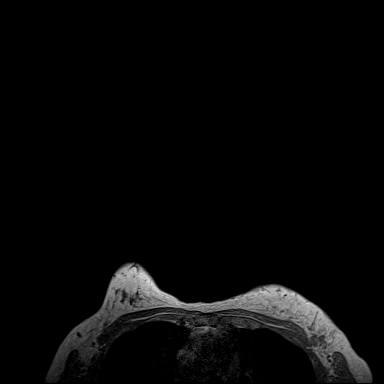
[im 144/144]
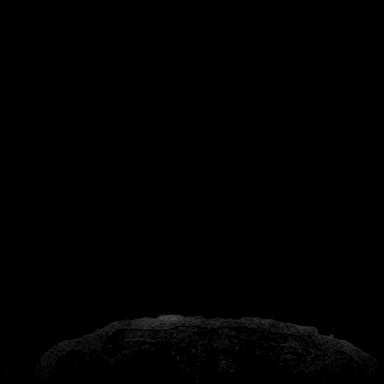

[Series 5: fl3d pre-cm · axial · non-contrast · 1.2mm · 0.94mm/px · z∈[-83,+89]mm · 5 of 144 slices shown]
[im 1/144]
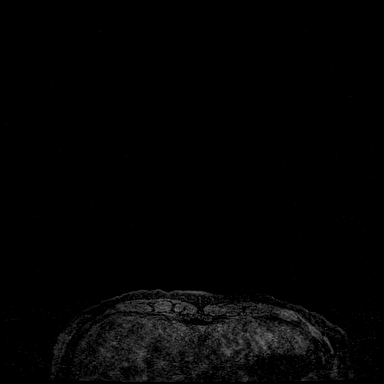
[im 36/144]
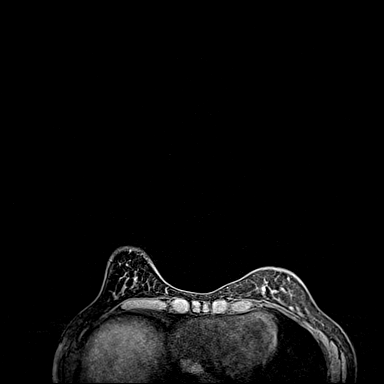
[im 72/144]
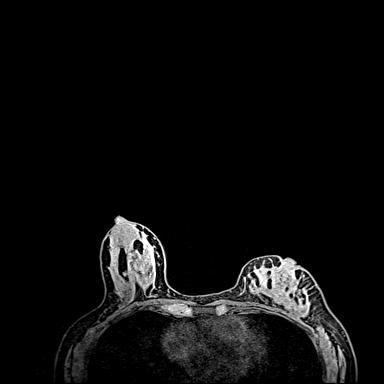
[im 108/144]
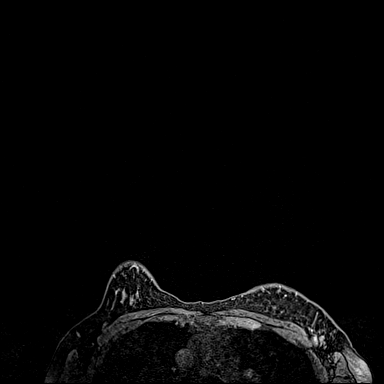
[im 144/144]
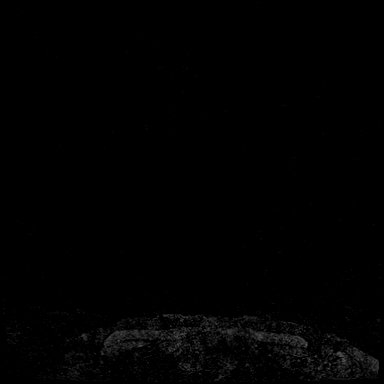

[Series 6: fl3d post immediate · axial · 1.2mm · 0.94mm/px · z∈[-83,+89]mm · 5 of 144 slices shown (1 of 3)]
[im 1/144]
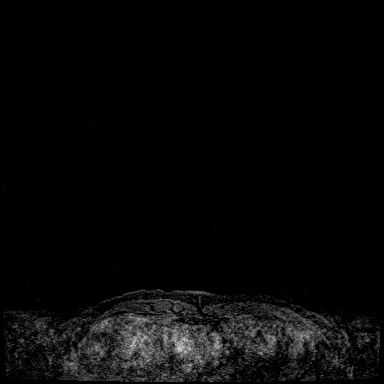
[im 36/144]
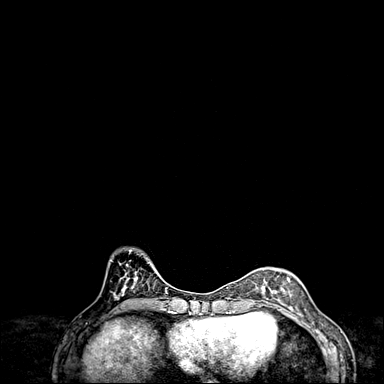
[im 72/144]
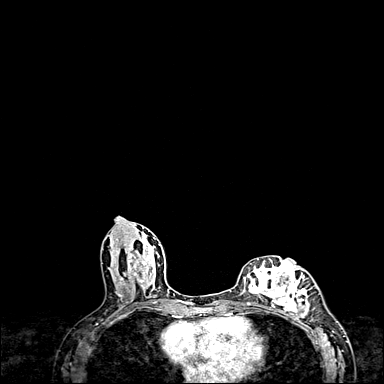
[im 108/144]
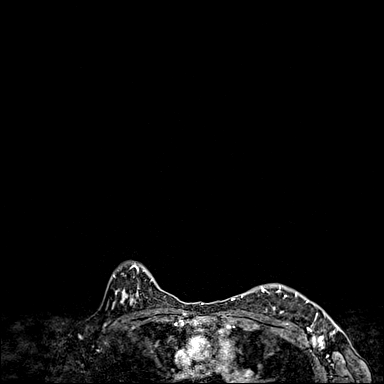
[im 144/144]
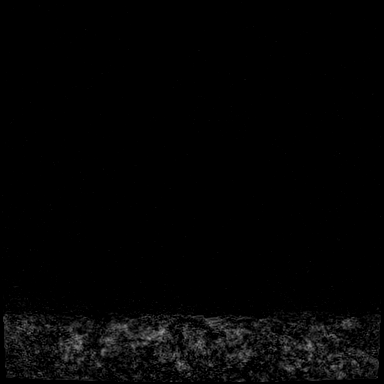

[Series 7: fl3d post immediate · axial · 1.2mm · 0.94mm/px · z∈[-83,+89]mm · 5 of 144 slices shown (2 of 3)]
[im 1/144]
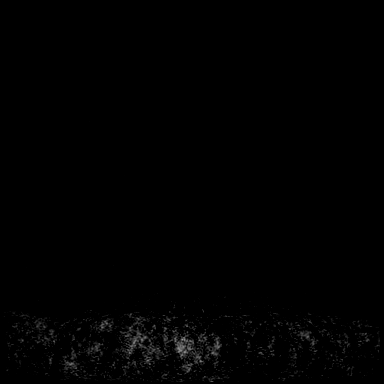
[im 36/144]
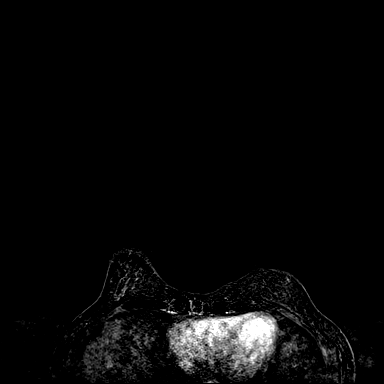
[im 72/144]
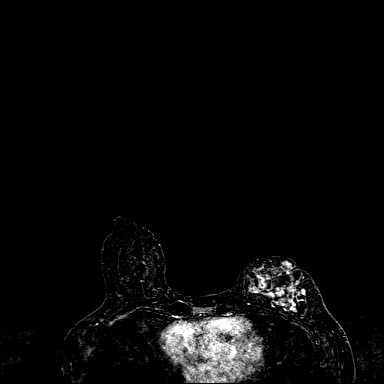
[im 108/144]
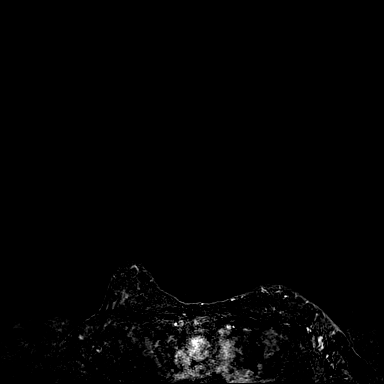
[im 144/144]
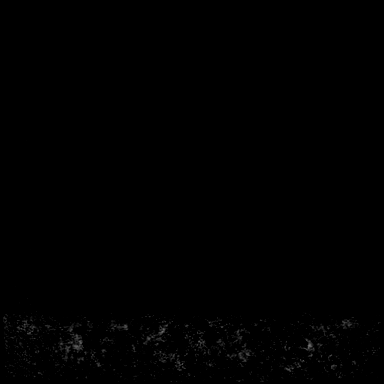

[Series 8: fl3d post immediate · axial · 172.8mm · 0.94mm/px · 1 of 1 slices shown (3 of 3)]
[im 1/1]
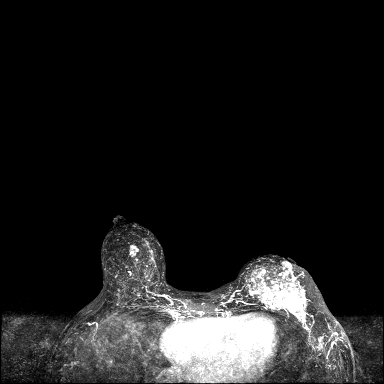

[Series 9: fl3d post 3min · axial · 1.2mm · 0.94mm/px · z∈[-83,+89]mm · 5 of 144 slices shown]
[im 1/144]
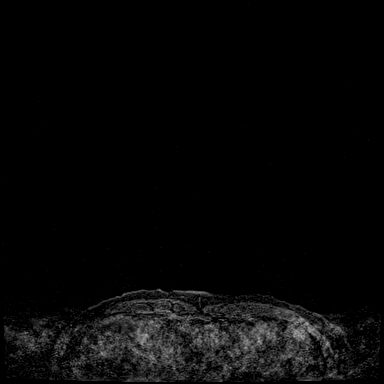
[im 36/144]
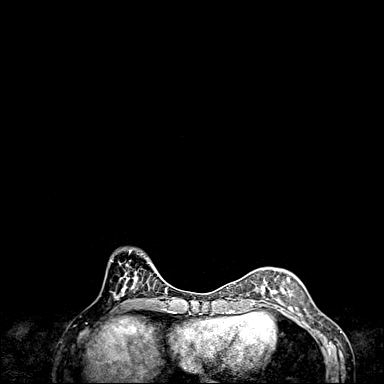
[im 72/144]
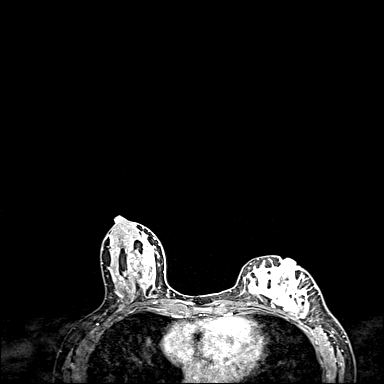
[im 108/144]
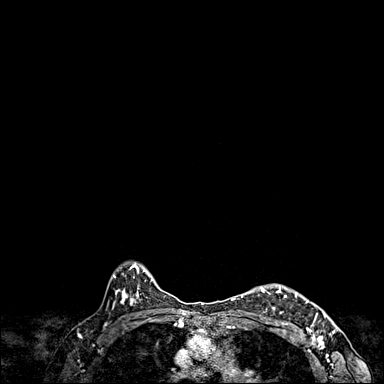
[im 144/144]
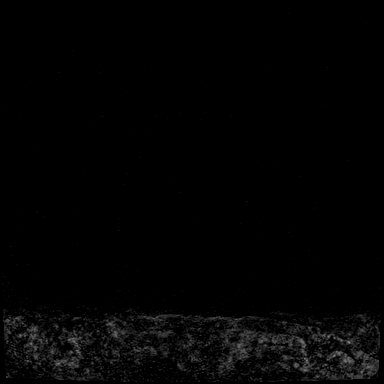

[Series 10: fl3d post 3min_sub · axial · 1.2mm · 0.94mm/px · z∈[-83,+19]mm · 4 of 144 slices shown]
[im 1/144]
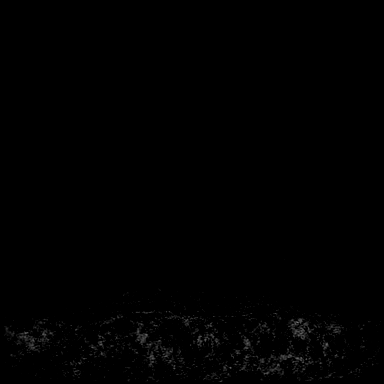
[im 29/144]
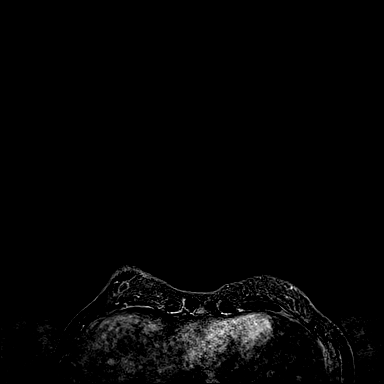
[im 58/144]
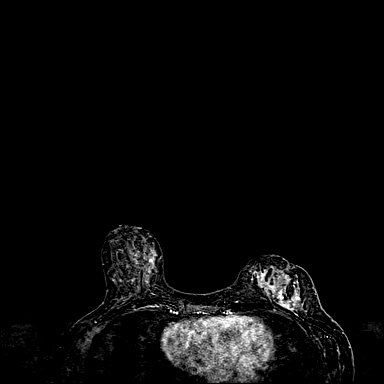
[im 86/144]
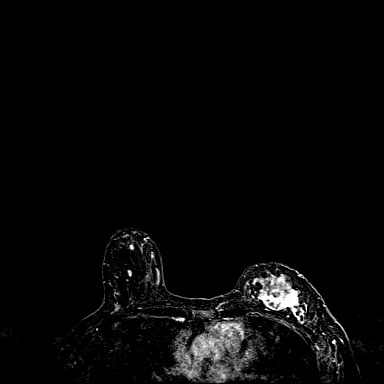

[31 of 48 positions shown; findings below may reference images not displayed]

Three-dimensional MR images were rendered by post-processing of the
original MR data on an independent workstation. The
three-dimensional MR images were interpreted, and findings are
reported in the following complete MRI report for this study. Three
dimensional images were evaluated at the independent interpreting
workstation using the DynaCAD thin client.
FINDINGS: Breast composition: d. Extreme fibroglandular tissue.

Background parenchymal enhancement: Moderate

Right breast: Within the anterior UPPER central RIGHT breast, there
is an enhancing oval mass with irregular margins measuring 1.2 x
x 1.3 centimeters. Mass shows persistent type enhancement kinetics.
(Image 65 of series 7).

Just posterior to this mass, approximately 2 millimeters away, there
is a 4 millimeter enhancing mass. (Image 63 of series 7).

Slightly superior and LATERAL to these masses, there is an oval mass
with indistinct margins measuring 0.6 x 1.0 centimeters (image 60 of
series 7).

Left breast: There is heterogeneous enhancement of the central
portion of the LEFT breast, involving all quadrants. Tissue marker
clip is identified at the 12 o'clock location. Along the
posterolateral aspect of the LEFT breast, there is washout type
enhancement kinetics. Enhancement involves the retracted LEFT
nipple. The LEFT breast is significantly smaller than the RIGHT
breast. The enhancement spans 6.0 x 5.0 x 5.5 centimeters, larger
than anticipated based on ultrasound imaging.

Lymph nodes: Tissue marker clip is adjacent to an enlarged LEFT
axillary lymph node. RIGHT axillary lymph nodes are unremarkable. No
enlarged internal mammary lymph nodes.

Ancillary findings:  None.
IMPRESSION: 1. Suspicious 1.3 centimeter mass in the anterior UPPER central
RIGHT breast warranting tissue diagnosis. (Image 65 of series [DATE] millimeter possible satellite nodule posterior to the
centimeter mass in the RIGHT breast. (Image 63 of series [DATE]. Indeterminate oval mass in the UPPER central middle depth of the
RIGHT breast warranting tissue diagnosis. (Image 60 of series [DATE]. Large area of enhancement in the central portion of the LEFT
breast, involving all quadrants. There is associated enhancement and
retraction of the LEFT nipple, with significantly smaller size of
the LEFT breast. Enhancement spans at least 6.5 centimeters.
5. Enlarged, previously biopsied LEFT axillary lymph node.

RECOMMENDATION:
1. Recommend MR guided core biopsy of 1.3 centimeter mass in the
anterior UPPER central RIGHT breast.
2. Recommend MR guided core biopsy of 1.0 centimeter indeterminate
mass in the UPPER central RIGHT breast, at middle depth.
3. Recommend treatment plan for known LEFT breast malignancy.

BI-RADS CATEGORY  4: Suspicious.

## 2020-11-21 MED ORDER — GADOBUTROL 1 MMOL/ML IV SOLN
6.0000 mL | Freq: Once | INTRAVENOUS | Status: AC | PRN
  Administered 2020-11-21: 6 mL via INTRAVENOUS

## 2020-11-24 ENCOUNTER — Telehealth: Payer: Self-pay | Admitting: *Deleted

## 2020-11-24 NOTE — Telephone Encounter (Signed)
Pt informed she has decided to move forward with expander/implant based reconstruction. Physician team notified.

## 2020-11-25 ENCOUNTER — Encounter: Payer: Self-pay | Admitting: Genetic Counselor

## 2020-11-25 ENCOUNTER — Telehealth: Payer: Self-pay | Admitting: Genetic Counselor

## 2020-11-25 ENCOUNTER — Other Ambulatory Visit: Payer: Self-pay | Admitting: Hematology

## 2020-11-25 ENCOUNTER — Other Ambulatory Visit: Payer: Self-pay | Admitting: *Deleted

## 2020-11-25 ENCOUNTER — Encounter: Payer: Self-pay | Admitting: *Deleted

## 2020-11-25 DIAGNOSIS — R928 Other abnormal and inconclusive findings on diagnostic imaging of breast: Secondary | ICD-10-CM

## 2020-11-25 DIAGNOSIS — Z170 Estrogen receptor positive status [ER+]: Secondary | ICD-10-CM

## 2020-11-25 DIAGNOSIS — C50412 Malignant neoplasm of upper-outer quadrant of left female breast: Secondary | ICD-10-CM

## 2020-11-25 DIAGNOSIS — Z1379 Encounter for other screening for genetic and chromosomal anomalies: Secondary | ICD-10-CM | POA: Insufficient documentation

## 2020-11-25 NOTE — Telephone Encounter (Signed)
Contacted patient in attempt to disclose results of genetic testing.  LVM with contact information requesting a call back.  

## 2020-11-26 NOTE — Telephone Encounter (Signed)
Revealed negative genetic testing.  Discussed that we do not know why she has breast cancer or why there is cancer in the family. It could be familial, due to a different gene that we are not testing, or maybe our current technology may not be able to pick something up.  It will be important for her to keep in contact with genetics to keep up with whether additional testing may be needed.

## 2020-11-27 ENCOUNTER — Ambulatory Visit
Admission: RE | Admit: 2020-11-27 | Discharge: 2020-11-27 | Disposition: A | Discharge status: Home / Self Care | Payer: BC Managed Care – PPO | Source: Ambulatory Visit | Attending: Hematology | Admitting: Hematology

## 2020-11-27 ENCOUNTER — Other Ambulatory Visit: Payer: Self-pay

## 2020-11-27 ENCOUNTER — Other Ambulatory Visit (HOSPITAL_COMMUNITY): Payer: Self-pay | Admitting: Diagnostic Radiology

## 2020-11-27 ENCOUNTER — Ambulatory Visit: Payer: Self-pay | Admitting: Genetic Counselor

## 2020-11-27 DIAGNOSIS — R928 Other abnormal and inconclusive findings on diagnostic imaging of breast: Secondary | ICD-10-CM

## 2020-11-27 DIAGNOSIS — Z170 Estrogen receptor positive status [ER+]: Secondary | ICD-10-CM

## 2020-11-27 DIAGNOSIS — Z1379 Encounter for other screening for genetic and chromosomal anomalies: Secondary | ICD-10-CM

## 2020-11-27 DIAGNOSIS — C50412 Malignant neoplasm of upper-outer quadrant of left female breast: Secondary | ICD-10-CM

## 2020-11-27 DIAGNOSIS — Z803 Family history of malignant neoplasm of breast: Secondary | ICD-10-CM

## 2020-11-27 IMAGING — MR MR BREAST BX W/ LOC DEV EA ADD LESION IMAGE BX SPEC MR GUIDE*R*
5 of 8 series · 26 of 48 positions shown · IV contrast (6ML GADAVIST)
Comparison: Previous exams.
COMPARISON: Previous exams.

Addendum:
CLINICAL DATA: 40-year-old female presents for MR guided biopsy of
a 1.3 cm anterior UPPER central RIGHT breast mass (CYLINDER clip)
and 1 cm mid UPPER central RIGHT breast mass. Recent diagnosis of
LEFT breast cancer and LEFT axillary lymph node metastasis.
TECHNIQUE: Multiplanar, multisequence MR imaging of the RIGHT breast was
performed both before and after administration of intravenous
contrast.

CONTRAST:  6mL GADAVIST GADOBUTROL 1 MMOL/ML IV SOLN

[Series 2: fiducial unilateral · sagittal · 2.0mm · 1.33mm/px · 1 of 52 slices shown]
[im 1/52]
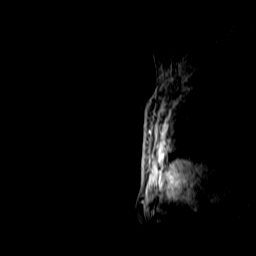

[Series 3: dynamic pre · axial · non-contrast · 1.3mm · 0.73mm/px · z∈[-96,+111]mm · 6 of 160 slices shown]
[im 1/160]
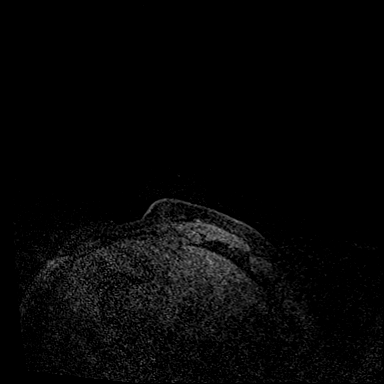
[im 32/160]
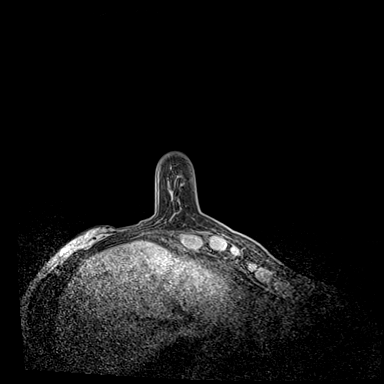
[im 64/160]
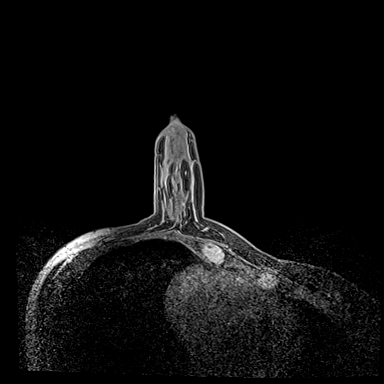
[im 96/160]
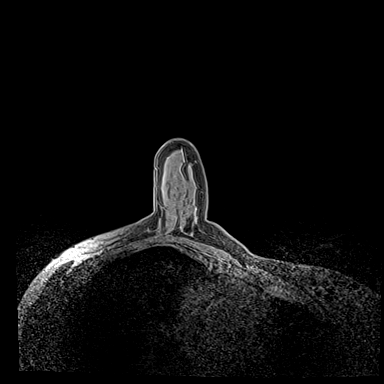
[im 128/160]
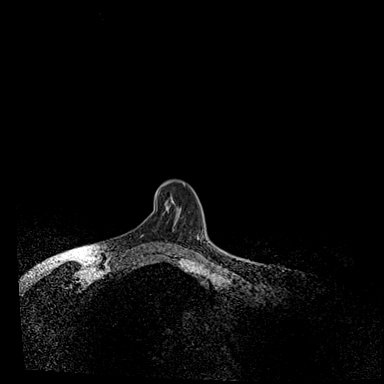
[im 160/160]
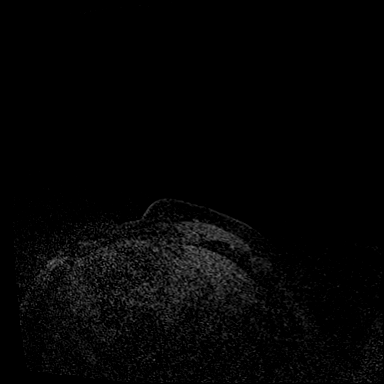

[Series 4: dynamic post 20 · axial · 1.3mm · 0.73mm/px · z∈[-96,+111]mm · 6 of 160 slices shown (1 of 2)]
[im 1/160]
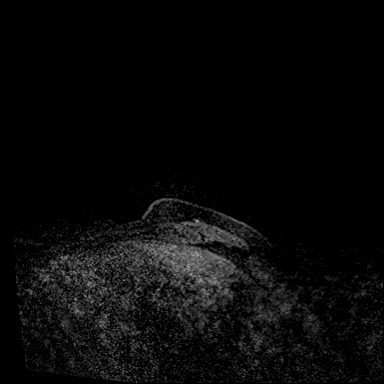
[im 32/160]
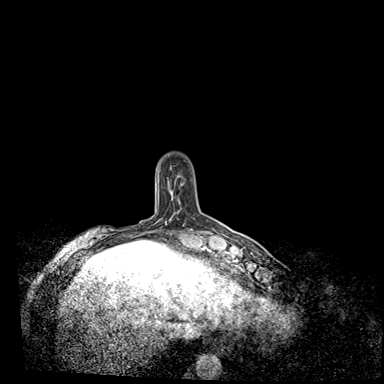
[im 64/160]
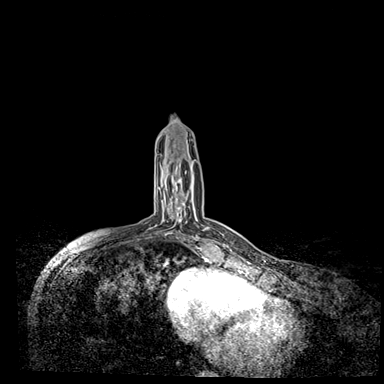
[im 96/160]
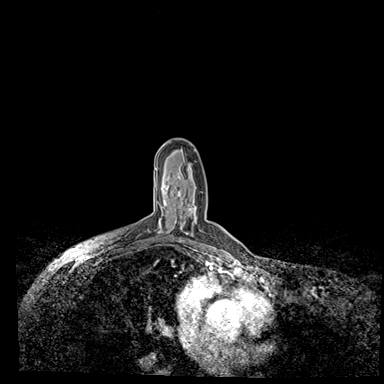
[im 128/160]
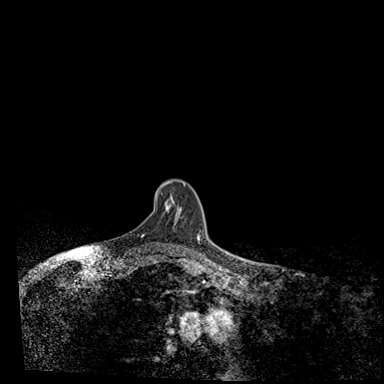
[im 160/160]
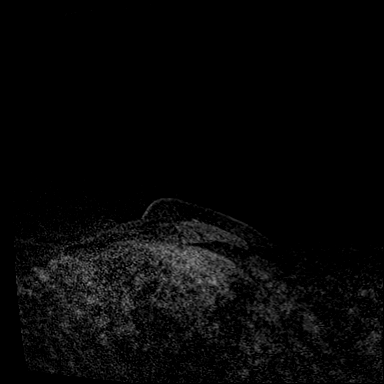

[Series 5: dynamic post 20 · axial · 1.3mm · 0.73mm/px · z∈[-96,+111]mm · 7 of 160 slices shown (2 of 2)]
[im 1/160]
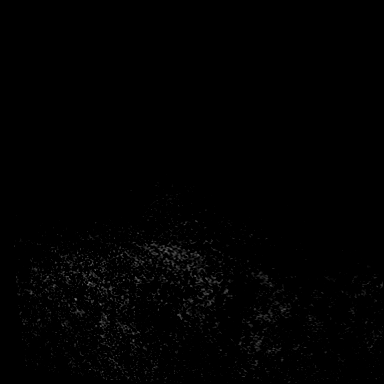
[im 27/160]
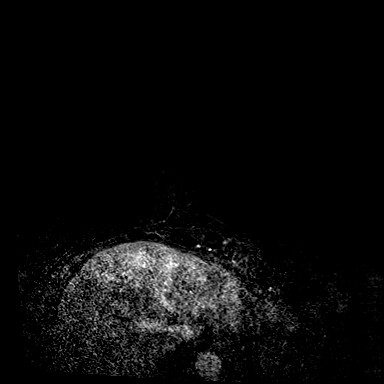
[im 54/160]
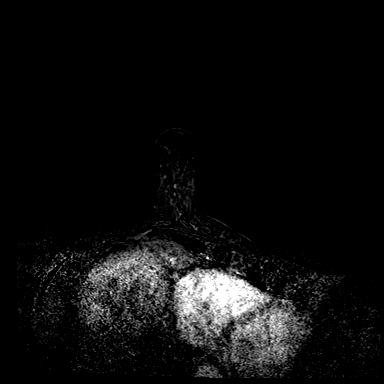
[im 80/160]
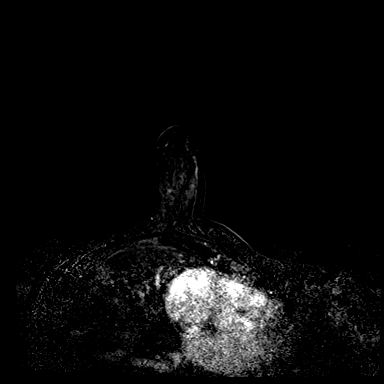
[im 107/160]
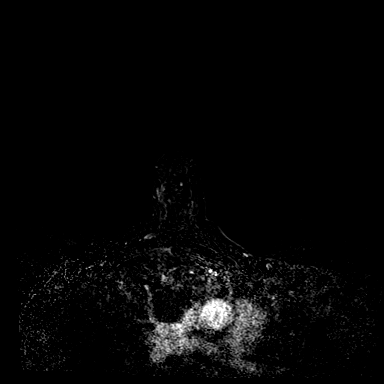
[im 133/160]
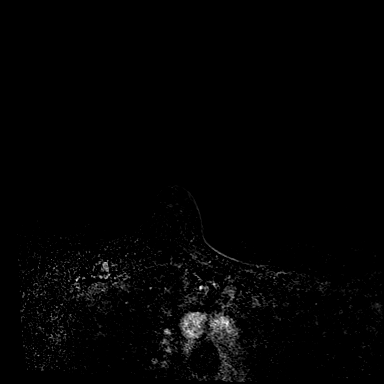
[im 160/160]
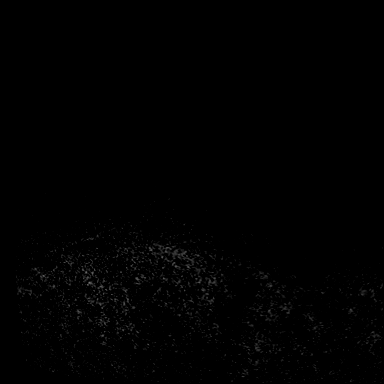

[Series 6: dynamic post 3 · axial · 1.3mm · 0.73mm/px · z∈[-96,+75]mm · 6 of 160 slices shown]
[im 1/160]
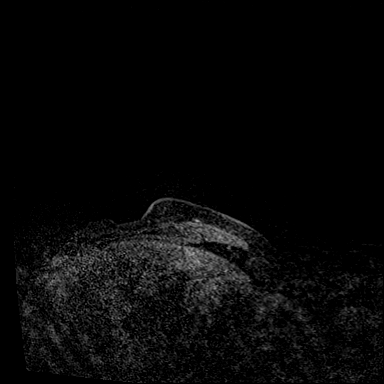
[im 27/160]
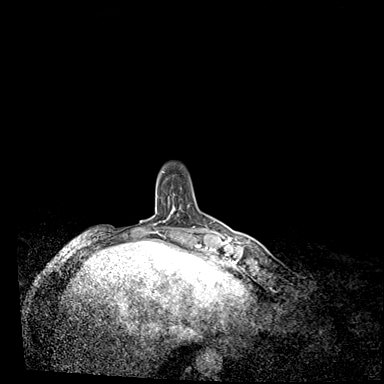
[im 54/160]
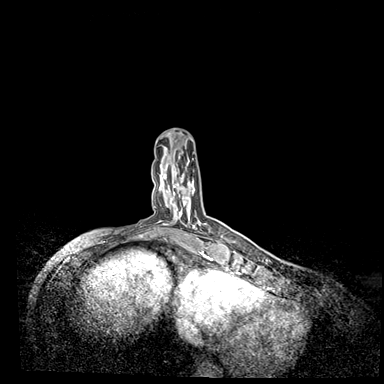
[im 80/160]
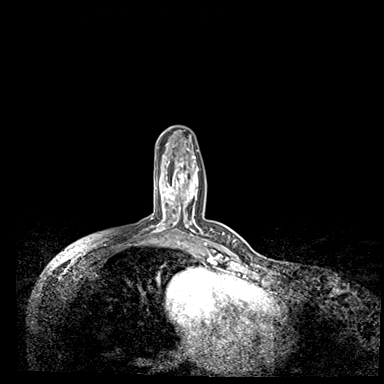
[im 107/160]
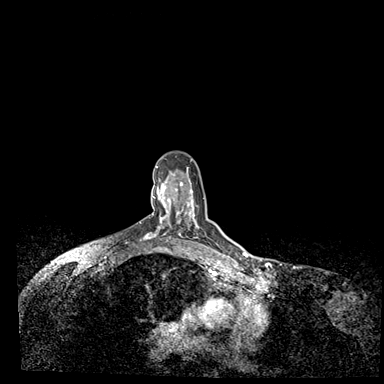
[im 133/160]
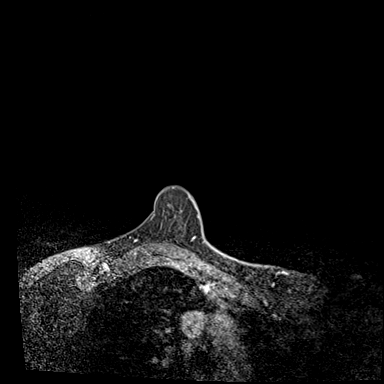

[26 of 48 positions shown; findings below may reference images not displayed]

On preprocedure scanning, the 1.3 cm anterior UPPER central RIGHT
breast mass was again visualized and subsequently sampled with a
CYLINDER clip placed at the biopsy site.

However, the 1 cm mid UPPER central RIGHT breast mass could not be
definitely identified and only vascular structures in this area were
noted.

Additionally, on today's study, a 1 cm focal area of non masslike
enhancement within the posterior UPPER RIGHT breast (series 7: Image
59) was identified and was going to be sampled. However, after
needle placement for this 2nd biopsy, sampling could not be
performed due to needle/biopsy machine malfunction, and a BARBELL
biopsy clip was placed at the site for subsequent localization if
needed.

EXAM:
MRI GUIDED CORE NEEDLE BIOPSY OF THE RIGHT BREAST
FINDINGS: I met with the patient, and we discussed the procedure of MRI guided
biopsy, including risks, benefits, and alternatives. Specifically,
we discussed the risks of infection, bleeding, tissue injury, clip
migration, and inadequate sampling. Informed, written consent was
given. The usual time out protocol was performed immediately prior
to the procedure.

Using sterile technique, 1% Lidocaine, MRI guidance, and a 9 gauge
vacuum assisted device, biopsy was performed of the 1.3 cm mass in
the anterior UPPER central RIGHT breast using a LATERAL approach. At
the conclusion of the procedure, a CYLINDER tissue marker clip was
deployed into the biopsy cavity. Follow-up 2-view mammogram was
performed and dictated separately.

Using sterile technique, 1% Lidocaine, MRI guidance, a 9 gauge
vacuum assisted device, was placed to the newly identified 1 cm
focal non masslike enhancement in the posterior UPPER central RIGHT
breast using a LATERAL approach. However due to needle/biopsy
machine technical difficulties, sampling could not be performed. A
BARBELL biopsy clip was then placed at this site.

The patient was also unable to tolerate prone positioning any
further due to extreme pain and post biopsy sequence was not
performed.
IMPRESSION: 1. MRI guided biopsy of 1.3 cm anterior UPPER central RIGHT breast
mass (CYLINDER clip). No apparent complications.
2. 1 cm mid UPPER central RIGHT breast mass not definitely
identified today and therefore not sampled. If RIGHT mastectomy is
not performed, then 6 month MRI follow-up is recommended.
3. 1 cm focal non masslike enhancement within the posterior UPPER
central RIGHT breast identified on today's preprocedure imaging, but
could not be sampled due to technical difficulties. A BARBELL biopsy
clip was placed at this site. If the 1.3 cm anterior UPPER central
RIGHT breast mass requires surgical removal, then recommend surgical
excision of this area with localization of the BARBELL clip if
mastectomy is not performed. If the anterior UPPER central RIGHT
breast mass is benign/does not require surgical removal, then
six-month MRI follow-up is recommended.

ADDENDUM:
Pathology revealed FIBROADENOMATOID AND FIBROCYSTIC CHANGES WITH
CALCIFICATIONS, PSEUDOANGIOMATOUS STROMAL HYPERPLASIA, FOCAL
PERIDUCTULAR CHRONIC INFLAMMATION of the RIGHT breast, anterior
upper central, (cylinder clip). This was found to be concordant by
Dr. OYELADUN.

Pathology results were discussed with the patient by telephone. The
patient reported doing well after the biopsy with tenderness at the
site. Post biopsy instructions and care were reviewed and questions
were answered. The patient was encouraged to call The [REDACTED] for any additional concerns. My direct phone
number was provided.

The patient was instructed to return for breast MRI in 6 months, per
protocol.

The patient has a recent diagnosis of LEFT breast cancer and should
follow her outlined treatment plan.

Pathology results reported by OYELADUN, RN on [DATE].

*** End of Addendum ***
On preprocedure scanning, the 1.3 cm anterior UPPER central RIGHT
breast mass was again visualized and subsequently sampled with a
CYLINDER clip placed at the biopsy site.

However, the 1 cm mid UPPER central RIGHT breast mass could not be
definitely identified and only vascular structures in this area were
noted.

Additionally, on today's study, a 1 cm focal area of non masslike
enhancement within the posterior UPPER RIGHT breast (series 7: Image
59) was identified and was going to be sampled. However, after
needle placement for this 2nd biopsy, sampling could not be
performed due to needle/biopsy machine malfunction, and a BARBELL
biopsy clip was placed at the site for subsequent localization if
needed.

EXAM:
MRI GUIDED CORE NEEDLE BIOPSY OF THE RIGHT BREAST
FINDINGS: I met with the patient, and we discussed the procedure of MRI guided
biopsy, including risks, benefits, and alternatives. Specifically,
we discussed the risks of infection, bleeding, tissue injury, clip
migration, and inadequate sampling. Informed, written consent was
given. The usual time out protocol was performed immediately prior
to the procedure.

Using sterile technique, 1% Lidocaine, MRI guidance, and a 9 gauge
vacuum assisted device, biopsy was performed of the 1.3 cm mass in
the anterior UPPER central RIGHT breast using a LATERAL approach. At
the conclusion of the procedure, a CYLINDER tissue marker clip was
deployed into the biopsy cavity. Follow-up 2-view mammogram was
performed and dictated separately.

Using sterile technique, 1% Lidocaine, MRI guidance, a 9 gauge
vacuum assisted device, was placed to the newly identified 1 cm
focal non masslike enhancement in the posterior UPPER central RIGHT
breast using a LATERAL approach. However due to needle/biopsy
machine technical difficulties, sampling could not be performed. A
BARBELL biopsy clip was then placed at this site.

The patient was also unable to tolerate prone positioning any
further due to extreme pain and post biopsy sequence was not
performed.
IMPRESSION: 1. MRI guided biopsy of 1.3 cm anterior UPPER central RIGHT breast
mass (CYLINDER clip). No apparent complications.
2. 1 cm mid UPPER central RIGHT breast mass not definitely
identified today and therefore not sampled. If RIGHT mastectomy is
not performed, then 6 month MRI follow-up is recommended.
3. 1 cm focal non masslike enhancement within the posterior UPPER
central RIGHT breast identified on today's preprocedure imaging, but
could not be sampled due to technical difficulties. A BARBELL biopsy
clip was placed at this site. If the 1.3 cm anterior UPPER central
RIGHT breast mass requires surgical removal, then recommend surgical
excision of this area with localization of the BARBELL clip if
mastectomy is not performed. If the anterior UPPER central RIGHT
breast mass is benign/does not require surgical removal, then
six-month MRI follow-up is recommended.

## 2020-11-27 IMAGING — MG MM BREAST LOCALIZATION CLIP
4 series · 4 of 12 positions shown · non-contrast
Comparison: Previous exam(s).

CLINICAL DATA: 40-year-old female for evaluation of placement of
CYLINDER biopsy clip following MR guided UPPER RIGHT breast biopsy.

An additional area in the posterior UPPER RIGHT breast was not able
to be sampled due to needle/biopsy machine technical difficulties,
so a BARBELL clip was placed at the site.
EXAM:
3D DIAGNOSTIC RIGHT MAMMOGRAM POST STEREOTACTIC BIOPSY

[R ML synth-2D]
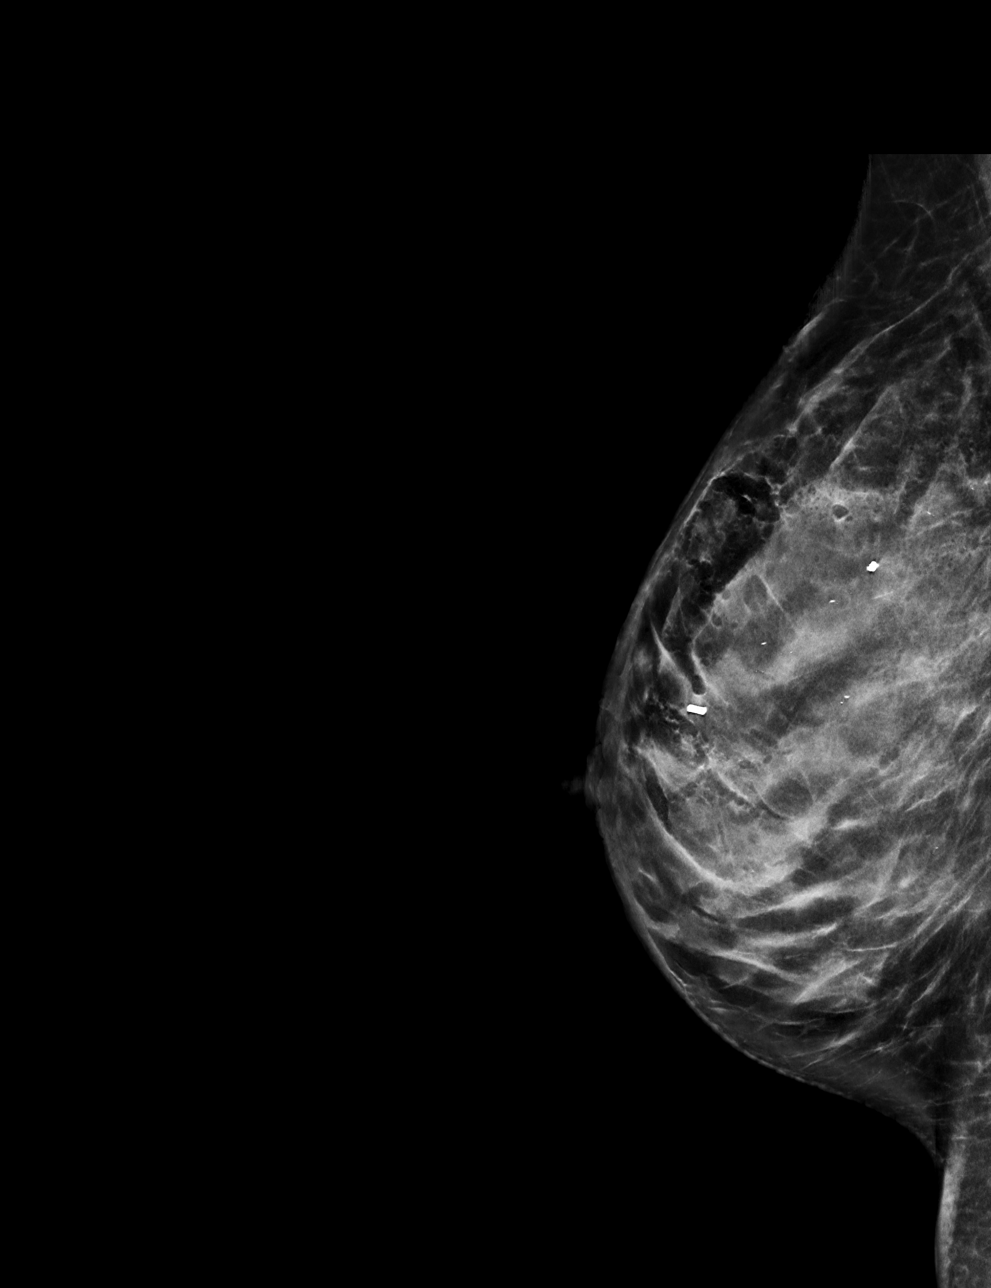

[R CC synth-2D]
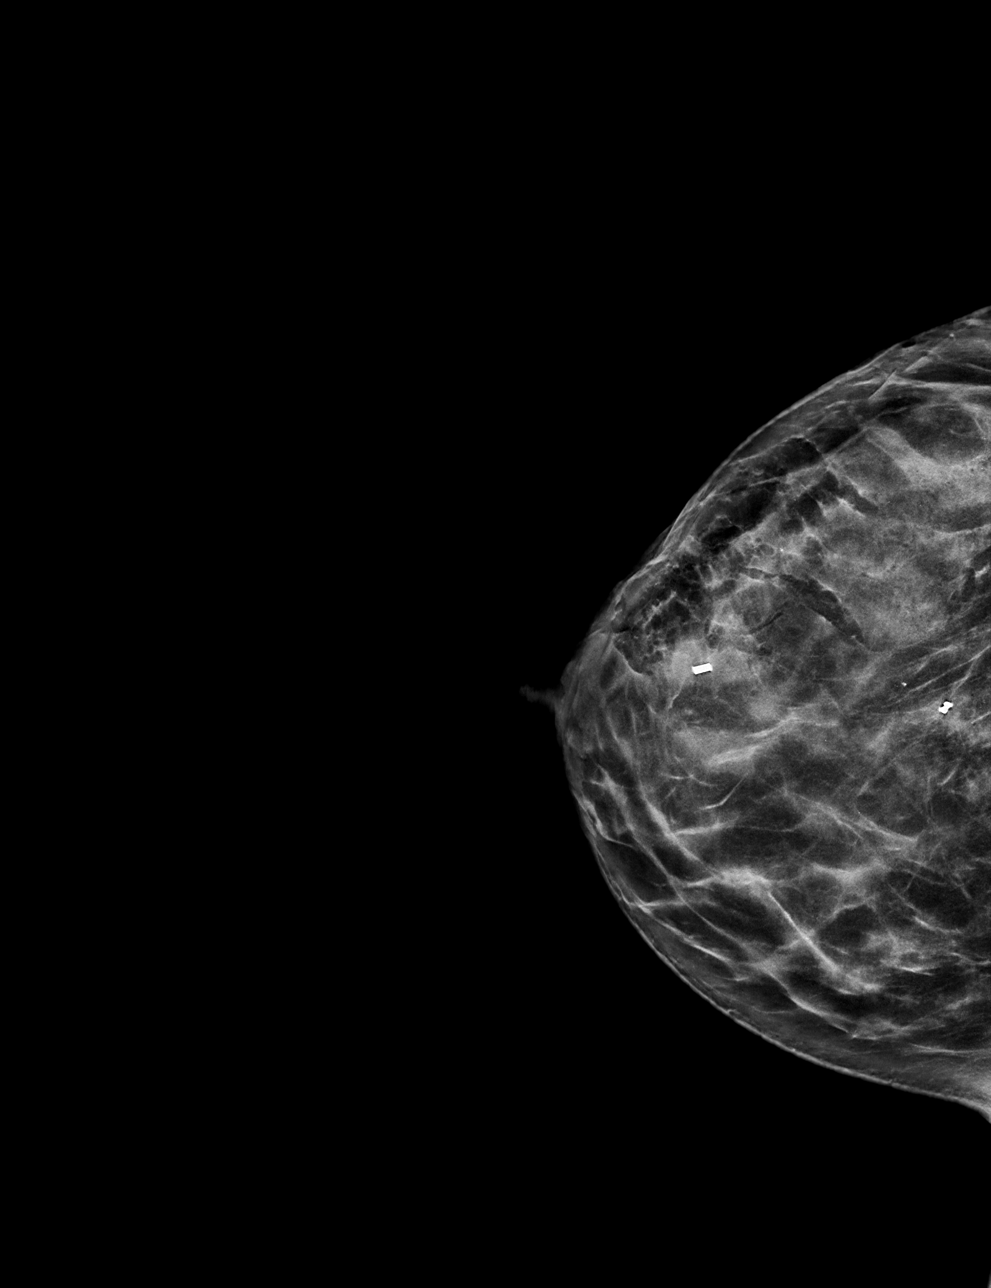

[R CC tomo · tomo slice 29/56.0]
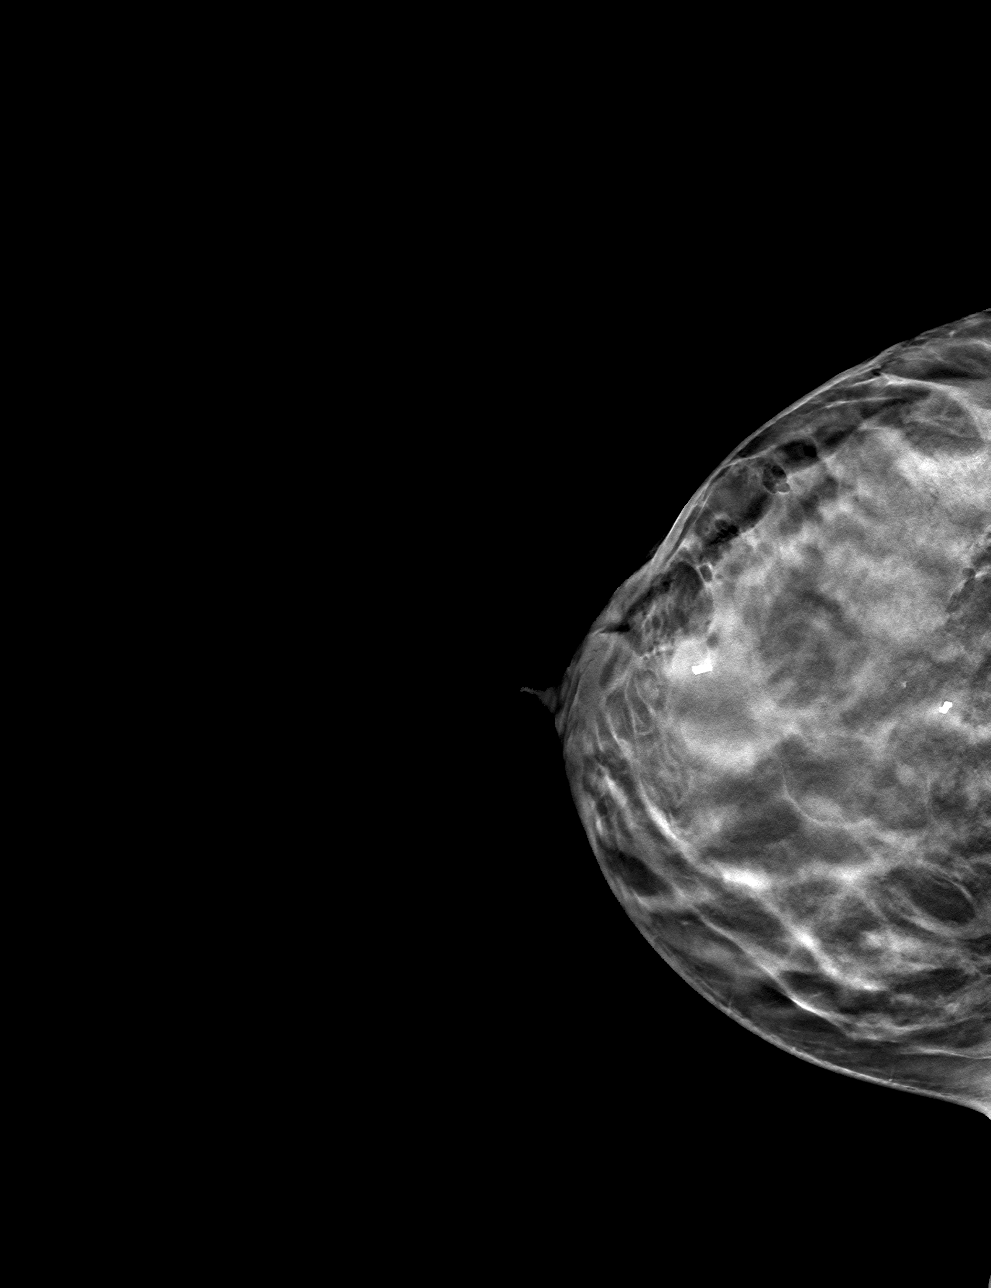

[R ML tomo · tomo slice 29/58.0]
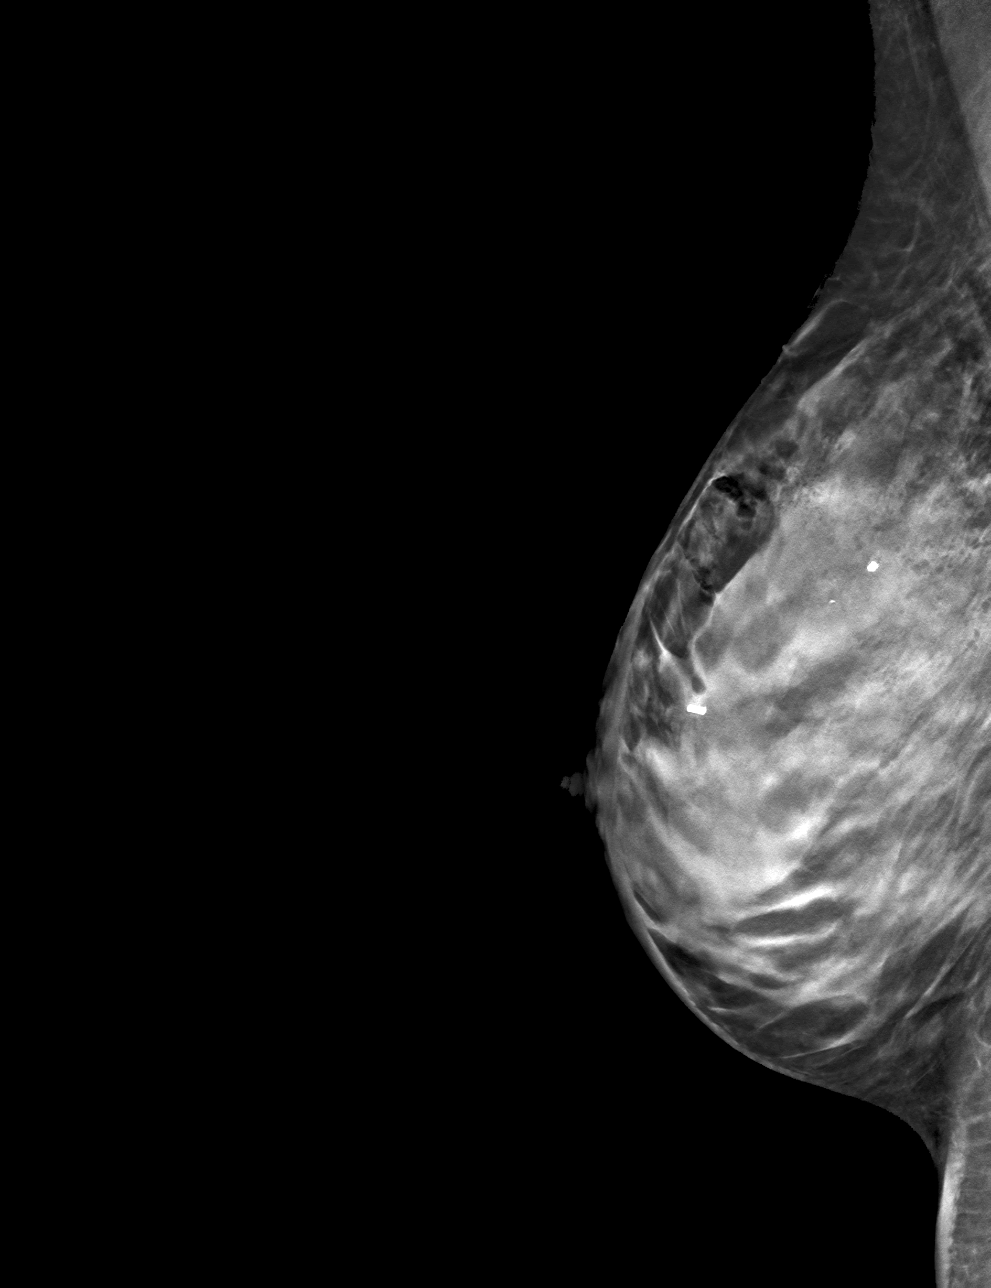

[4 of 12 positions shown; findings below may reference images not displayed]

FINDINGS: 3D Mammographic images were obtained following MR guided biopsy of
1.3 cm anterior UPPER RIGHT breast mass. The CYLINDER biopsy
marking clip is in expected position at the site of biopsy.

The BARBELL biopsy marking clip is in expected position within the
posterior UPPER RIGHT breast. Please note that a biopsy was not
performed at this location due to technical difficulties with the
needle/biopsy machine.
IMPRESSION: Appropriate positioning of the CYLINDER shaped biopsy marking clip
at the site of biopsy in the anterior UPPER RIGHT breast.

Appropriate positioning of the BARBELL shaped clip at the site of
posterior UPPER RIGHT breast non masslike enhancement. Please note
sampling of this area was not performed due to needle/biopsy machine
technical difficulties at the time.

Final Assessment: Post Procedure Mammograms for Marker Placement

## 2020-11-27 MED ORDER — GADOBUTROL 1 MMOL/ML IV SOLN
6.0000 mL | Freq: Once | INTRAVENOUS | Status: AC | PRN
  Administered 2020-11-27: 6 mL via INTRAVENOUS

## 2020-11-27 NOTE — Telephone Encounter (Signed)
Revealed negative results of pan-cancer panel.

## 2020-11-27 NOTE — Progress Notes (Signed)
HPI:   Ms. Pamela Reyes was previously seen in the [facility] clinic due to a personal and family history of breast cancer and concerns regarding a hereditary predisposition to cancer. Please refer to our prior cancer genetics clinic note for more information regarding our discussion, assessment and recommendations, at the time. Ms. Pamela Reyes recent genetic test results were disclosed to her, as were recommendations warranted by these results. These results and recommendations are discussed in more detail below.  CANCER HISTORY:  Oncology History Overview Note  Cancer Staging Malignant neoplasm of upper-outer quadrant of left breast in female, estrogen receptor positive ([place]) Staging form: Breast, AJCC 8th Edition - Clinical stage from 11/05/2020: Stage IIA (cT2, cN1, cM0, G1, ER+, PR+, HER2: Equivocal) - Signed by [name], MD on 11/10/2020    Malignant neoplasm of upper-outer quadrant of left breast in female, estrogen receptor positive ([place])  10/28/2020 Mammogram   Bilateral Diagnostic Mammogram; Left Breast Ultrasound  IMPRESSION: 1. Suspicious palpable mass/hypoechoic area in the left breast at 12 o'clock retroareolar measuring 4.5 cm.   2.  Suspicious lymph node in the left axilla.   11/05/2020 Cancer Staging   Staging form: Breast, AJCC 8th Edition - Clinical stage from 11/05/2020: Stage IIA (cT2, cN1, cM0, G1, ER+, PR+, HER2: Equivocal) - Signed by [name], MD on 11/10/2020 Stage prefix: Initial diagnosis Histologic grading system: 3 grade system    11/05/2020 Pathology Results   Diagnosis 1. Breast, left, needle core biopsy, 12 o'clock subareolar, ribbon clip - INVASIVE MAMMARY CARCINOMA. SEE NOTE 1. Carcinoma measures 1.5 cm in greatest linear dimension and appears grade 2. 1. Immunohistochemical stain for E-cadherin is positive in the tumor cells, consistent with a ductal phenotype. 1. PROGNOSTIC INDICATORS Results: The tumor cells are EQUIVOCAL for Her2 (2+).  Her2 by FISH will be performed and results reported separately. Estrogen Receptor: 70%, POSITIVE, STRONG-MODERATE STAINING INTENSITY Progesterone Receptor: 80%, POSITIVE, MODERATE STAINING INTENSITY Proliferation Marker Ki67: 1%  2. Lymph node, needle/core biopsy, left axilla, tribell clip - METASTATIC CARCINOMA TO A LYMPH NODE. SEE NOTE 2. Largest contiguous focus of metastatic carcinoma measures 0.3 cm. 2. PROGNOSTIC INDICATORS Results: The tumor cells are EQUIVOCAL for Her2 (2+). Her2 by FISH will be performed and results reported separately. Estrogen Receptor: 90%, POSITIVE, STRONG STAINING INTENSITY Progesterone Receptor: 95%, POSITIVE, STRONG STAINING INTENSITY Proliferation Marker Ki67: 1%   11/10/2020 Initial Diagnosis   Malignant neoplasm of upper-outer quadrant of left breast in female, estrogen receptor positive ([place])   11/24/2020 Genetic Testing   Negative hereditary cancer genetic testing: no pathogenic variants detected in [facility] BRCAPlus STAT Panel or [facility] CustomNext-Cancer +RNAinsight Panel.  The report dates are November 24, 2020 and November 27, 2020, respectively.   The BRCAplus panel offered by [employer] and includes sequencing and deletion/duplication analysis for the following 8 genes: ATM, BRCA1, BRCA2, CDH1, CHEK2, PALB2, PTEN, and TP53.  The CustomNext-Cancer+RNAinsight panel offered by [facility] includes sequencing and rearrangement analysis for the following 47 genes:  APC, ATM, AXIN2, BARD1, BMPR1A, BRCA1, BRCA2, BRIP1, CDH1, CDK4, CDKN2A, CHEK2, DICER1, EPCAM, GREM1, HOXB13, MEN1, MLH1, MSH2, MSH3, MSH6, MUTYH, NBN, NF1, NF2, NTHL1, PALB2, PMS2, POLD1, POLE, PTEN, RAD51C, RAD51D, RECQL, RET, SDHA, SDHAF2, SDHB, SDHC, SDHD, SMAD4, SMARCA4, STK11, TP53, TSC1, TSC2, and VHL.  RNA data is routinely analyzed for use in variant interpretation for all genes.      FAMILY HISTORY:  We obtained a detailed, 4-generation family history.  Significant diagnoses are  listed below: Family History  Problem Relation  Age of Onset   Breast cancer Sister 74       ER+   Bladder Cancer Maternal Uncle        smoking hx   Cancer Maternal Uncle        kidney or prostate; dx after 65   Breast cancer Paternal Aunt        dx after 68; x2 paternal aunts   Lung cancer Maternal Grandfather        dx after 33; smoking hx   Breast cancer Paternal Grandmother        dx after 78     Ms. Pamela Reyes reported both sisters had negative genetic testing.  No reports were available for review today.   Ms. Pamela Reyes was unaware of previous family history of genetic testing for hereditary cancer risks besides that mentioned above. There is no reported Ashkenazi Jewish ancestry. There is no known consanguinity.  GENETIC TEST RESULTS:  The [facility] CustomNext-Cancer +RNAinsight Panel found no pathogenic mutations. The CustomNext-Cancer+RNAinsight panel offered by [facility] includes sequencing and rearrangement analysis for the following 47 genes:  APC, ATM, AXIN2, BARD1, BMPR1A, BRCA1, BRCA2, BRIP1, CDH1, CDK4, CDKN2A, CHEK2, DICER1, EPCAM, GREM1, HOXB13, MEN1, MLH1, MSH2, MSH3, MSH6, MUTYH, NBN, NF1, NF2, NTHL1, PALB2, PMS2, POLD1, POLE, PTEN, RAD51C, RAD51D, RECQL, RET, SDHA, SDHAF2, SDHB, SDHC, SDHD, SMAD4, SMARCA4, STK11, TP53, TSC1, TSC2, and VHL.  RNA data is routinely analyzed for use in variant interpretation for all genes.  The test report has been scanned into EPIC and is located under the Molecular Pathology section of the Results Review tab.  A portion of the result report is included below for reference. Genetic testing reported out on November 27, 2020.      Even though a pathogenic variant was not identified, possible explanations for the cancer in the family may include: There may be no hereditary risk for cancer in the family. The cancers in Ms. Pamela Reyes and/or her family may be due to other genetic, environmental, or hormonal factors. There may be a gene mutation in  one of these genes that current testing methods cannot detect but that chance is small. There could be another gene that has not yet been discovered, or that we have not yet tested, that is responsible for the cancer diagnoses in the family.  It is also possible there is a hereditary cause for the cancer in the family that Ms. Pamela Reyes did not inherit.   Therefore, it is important to remain in touch with cancer genetics in the future so that we can continue to offer Ms. Pamela Reyes the most up to date genetic testing.    ADDITIONAL GENETIC TESTING:   We discussed with Ms. Pamela Reyes that her genetic testing was fairly extensive given the noted personal and family history of cancer.  If there are genes identified to increase cancer risk that can be analyzed in the future, we would be happy to discuss and coordinate this testing at that time.    CANCER SCREENING RECOMMENDATIONS:  Ms. Pamela Reyes test result is considered negative (normal).  This means that we have not identified a hereditary cause for her personal history of cancer at this time.   An individual's cancer risk and medical management are not determined by genetic test results alone. Overall cancer risk assessment incorporates additional factors, including personal medical history, family history, and any available genetic information that may result in a personalized plan for cancer prevention and surveillance. Therefore, it is recommended she continue to follow the cancer  management and screening guidelines provided by her oncology and primary healthcare provider.  RECOMMENDATIONS FOR FAMILY MEMBERS:   Since she did not inherit a mutation in a cancer predisposition gene included on this panel, her children could not have inherited a mutation from her in one of these genes. Individuals in this family might be at some increased risk of developing cancer, over the general population risk, due to the family history of cancer.  Individuals in the  family should notify their providers of the family history of cancer. We recommend women in this family have a yearly mammogram beginning at age 10, or 9 years younger than the earliest onset of cancer, an annual clinical breast exam, and perform monthly breast self-exams.  Other members of the family may still carry a pathogenic variant in one of these genes that Ms. Pamela Reyes did not inherit. Based on the family history, we recommend her sister, who was diagnosed with breast cancer before age 33, also keep in touch with genetics to ensure that the most up-to-date genetic testing is offered in the future as testing progresses.   FOLLOW-UP:  Lastly, we discussed with Ms. Pamela Reyes that cancer genetics is a rapidly advancing field and it is possible that new genetic tests will be appropriate for her and/or her family members in the future. We encouraged her to remain in contact with cancer genetics on an annual basis so we can update her personal and family histories and let her know of advances in cancer genetics that may benefit this family.   Our contact number was provided. Ms. Pamela Reyes questions were answered to her satisfaction, and she knows she is welcome to call us at anytime with additional questions or concerns.   Pamela Reyes M. Joette Catching, [place], [facility] Genetic Counselor [email or web address] (P) [phone]

## 2020-11-30 ENCOUNTER — Encounter: Payer: Self-pay | Admitting: *Deleted

## 2020-12-01 ENCOUNTER — Encounter: Payer: Self-pay | Admitting: *Deleted

## 2020-12-01 NOTE — Progress Notes (Signed)
[facility] CSW Progress Notes  Intern spoke to patient by phone per [facility] follow up. Patient reports she is doing well and that her anxiety has significantly decreased since [facility] on 11/11/20. She is waiting for her surgery to be scheduled, which is main source of stress at this time. Patient had no other concerns or questions regarding support services and agrees to contact CSW if anything comes up.  Rosary Lively, Social Work Intern Supervised by Gwinda Maine, LCSW

## 2020-12-03 ENCOUNTER — Other Ambulatory Visit: Payer: Self-pay | Admitting: General Surgery

## 2020-12-03 ENCOUNTER — Telehealth: Payer: Self-pay | Admitting: *Deleted

## 2020-12-03 DIAGNOSIS — C50412 Malignant neoplasm of upper-outer quadrant of left female breast: Secondary | ICD-10-CM

## 2020-12-03 NOTE — Telephone Encounter (Signed)
Spoke to pt regarding surgery. Pt wishes to only have Left Mastectomy. Physician team notified.

## 2020-12-04 ENCOUNTER — Encounter: Payer: Self-pay | Admitting: *Deleted

## 2020-12-04 ENCOUNTER — Other Ambulatory Visit: Payer: Self-pay | Admitting: General Surgery

## 2020-12-04 DIAGNOSIS — C50412 Malignant neoplasm of upper-outer quadrant of left female breast: Secondary | ICD-10-CM

## 2020-12-04 DIAGNOSIS — Z170 Estrogen receptor positive status [ER+]: Secondary | ICD-10-CM

## 2020-12-07 ENCOUNTER — Telehealth: Payer: Self-pay | Admitting: Hematology

## 2020-12-07 ENCOUNTER — Other Ambulatory Visit: Payer: Self-pay | Admitting: Hematology

## 2020-12-07 MED ORDER — TAMOXIFEN CITRATE 20 MG PO TABS: 20.0000 mg | ORAL_TABLET | Freq: Every day | ORAL | 2 refills | Status: DC

## 2020-12-07 NOTE — Telephone Encounter (Signed)
I called pt today, her breast breast surgery is scheduled for January 06, 2021.  She is anxious to wait for a months before surgery.  I recommend her to start tamoxifen while she is waiting for surgery, and stop it 10 days before surgery.  I reviewed the potential side effects, especially hot flashes and the risk of thrombosis with her again.  She voiced good understanding and agrees to proceed.  I called into her local pharmacy today.  [name]  12/07/2020

## 2020-12-11 ENCOUNTER — Encounter: Payer: Self-pay | Admitting: *Deleted

## 2020-12-21 NOTE — H&P (Signed)
Subjective:     Patient ID: Pamela Reyes is a 40 y.o. female.   HPI   Returns for follow up discussion breast reconstruction. Presented with palpable left breast mass. In 2017 chart review notes MR guided biopsy of mass as fibroadenoma while living in [place]. Patient reported growth in mass. Diagnostic MMG/US 10/2020 showed left breast mass at 12 o'clock retroareolar measuring 4.5 cm. One enlarged LN noted in the left axilla. Biopsy demonstrated IDC ER/PR+, Her2-. LN positive for metastatic disease.   Breast MRI showed RIGHT breast 1.3 cm mass , 4 mmpossible satellite nodule posterior to this mass. Additional ineterminate oval mass in the RIGHT upper central breast noted. Left breast enhancement spans 6.0 x 5.0 x 5.5 cm, all quadrants. Enlarged, previously biopsied left axillary LN. Subsequent MR guided biopsy labeled right anterior upper central benign.   Staging scans no evidence distant metastatic disease.    Given size mass mastectomy recommend. Per Dr. Barry Dienes, she is not candidate for NSM. She will receive adjuvant radiation. Plan Mammaprint on surgical specimen.   Genetics negative. Sister, PA, PGM with breast ca. States sister had RT for treatment of her cancer.   Current 34 C. Wt down 10 lb over year by intention.   Works in used [employer], Audiological scientist. Lives with boyfriend.   Review of Systems  Neurological: Positive for numbness.  Psychiatric/Behavioral: The patient is nervous/anxious.     Remainder 12 point review negative    Objective:   Physical Exam Cardiovascular:     Rate and Rhythm: Normal rate and regular rhythm.     Heart sounds: Normal heart sounds.  Pulmonary:     Effort: Pulmonary effort is normal.     Breath sounds: Normal breath sounds.  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   Plan vertical scar for mastectomy.   Discussed use of acellular dermis in reconstruction, cadaveric source, incorporation over several weeks, risk that if has seroma or infection can act as additional nidus for infection if not incorporated.    Discussed prepectoral vs sub pectoral reconstruction. Discussed with patient and benefit of this is no animation deformity, may be less pain. Risk may be more visible rippling over upper poles, greater need of ADM. Reviewed pre pectoral would require larger amount acellular dermis, more drains. Discussed any type reconstruction also risks long term displacement implant and visible rippling. If prepectoral counseled I would recommend she be comfortable with silicone implants as more options that have less rippling. She agrees to prepectoral placement.   Discussed future surgery dependent on adjuvant treatments. This includes radiation- reviewed this significantly increases risk reconstruction including wound healing problems capsular contracture. Options include delay reconstruction and pursue LD + TE or autologous. Alternative would be placement expander at time of mastectomy- the benefit of this would be to "have something there." However this will mean expander would be in place for several months (approximately 3-6 months from end of radiation) before continuing  reconstruction process. At that time could do implant exchange alone, implant exchange with LD flap for radiated chest, or coversion to autologous.    Drain teaching completed. Rx for [facility] [place] given. Rx for oxycodone, Robaxin, and Bactrim given.

## 2020-12-30 NOTE — Pre-Procedure Instructions (Signed)
Surgical Instructions    Your procedure is scheduled on Wednesday, January 06, 2021 at 9:00 AM.  Report to [facility] Main Entrance "A" at 7:00 A.M., then check in with the Admitting office.  Call this number if you have problems the morning of surgery:  [phone]   If you have any questions prior to your surgery date call [phone]: Open Monday-Friday 8am-4pm    Remember:  Do not eat after midnight the night before your surgery  You may drink clear liquids until 6:00 AM the morning of your surgery.   Clear liquids allowed are: Water, Non-Citrus Juices (without pulp), Carbonated Beverages, Clear Tea, Black Coffee Only, and Gatorade  Please complete your PRE-SURGERY ENSURE that was provided to you by 6:00 AM the morning of surgery.  Please, if able, drink it in one setting. DO NOT SIP.     Take these medicines the morning of surgery with A SIP OF WATER:  sulfamethoxazole-trimethoprim (BACTRIM DS)  IF NEEDED: acetaminophen (TYLENOL) famotidine (PEPCID) methocarbamol (ROBAXIN) oxyCODONE (OXY IR/ROXICODONE   As of today, STOP taking any Aspirin (unless otherwise instructed by your surgeon) Aleve, Naproxen, Ibuprofen, Motrin, Advil, Goody's, BC's, all herbal medications, fish oil, and all vitamins.                     Do NOT Smoke (Tobacco/Vaping) or drink Alcohol 24 hours prior to your procedure.  If you use a CPAP at night, you may bring all equipment for your overnight stay.   Contacts, glasses, piercing's, hearing aid's, dentures or partials may not be worn into surgery, please bring cases for these belongings.    For patients admitted to the hospital, discharge time will be determined by your treatment team.   Patients discharged the day of surgery will not be allowed to drive home, and someone needs to stay with them for 24 hours.  NO VISITORS WILL BE ALLOWED IN PRE-OP WHERE PATIENTS GET READY FOR SURGERY.  ONLY 1 SUPPORT PERSON MAY BE PRESENT IN THE WAITING ROOM  WHILE YOU ARE IN SURGERY.  IF YOU ARE TO BE ADMITTED, ONCE YOU ARE IN YOUR ROOM YOU WILL BE ALLOWED TWO (2) VISITORS.  Minor children may have two parents present. Special consideration for safety and communication needs will be reviewed on a case by case basis.   Special instructions:   [facility]- Preparing For Surgery  Before surgery, you can play an important role. Because skin is not sterile, your skin needs to be as free of germs as possible. You can reduce the number of germs on your skin by washing with CHG (chlorahexidine gluconate) Soap before surgery.  CHG is an antiseptic cleaner which kills germs and bonds with the skin to continue killing germs even after washing.    Oral Hygiene is also important to reduce your risk of infection.  Remember - BRUSH YOUR TEETH THE MORNING OF SURGERY WITH YOUR REGULAR TOOTHPASTE  Please do not use if you have an allergy to CHG or antibacterial soaps. If your skin becomes reddened/irritated stop using the CHG.  Do not shave (including legs and underarms) for at least 48 hours prior to first CHG shower. It is OK to shave your face.  Please follow these instructions carefully.   Shower the NIGHT BEFORE SURGERY and the MORNING OF SURGERY  If you chose to wash your hair, wash your hair first as usual with your normal shampoo.  After you shampoo, rinse your hair and body thoroughly to remove the  shampoo.  Use CHG Soap as you would any other liquid soap. You can apply CHG directly to the skin and wash gently with a scrungie or a clean washcloth.   Apply the CHG Soap to your body ONLY FROM THE NECK DOWN.  Do not use on open wounds or open sores. Avoid contact with your eyes, ears, mouth and genitals (private parts). Wash Face and genitals (private parts)  with your normal soap.   Wash thoroughly, paying special attention to the area where your surgery will be performed.  Thoroughly rinse your body with warm water from the neck down.  DO NOT  shower/wash with your normal soap after using and rinsing off the CHG Soap.  Pat yourself dry with a CLEAN TOWEL.  Wear CLEAN PAJAMAS to bed the night before surgery  Place CLEAN SHEETS on your bed the night before your surgery  DO NOT SLEEP WITH PETS.   Day of Surgery: Shower with CHG soap. Do not wear jewelry, make up, nail polish, gel polish, artificial nails, or any other type of covering on natural nails including finger and toenails. If patients have artificial nails, gel coating, etc. that need to be removed by a nail salon please have this removed prior to surgery. Surgery may need to be canceled/delayed if the surgeon/ anesthesia feels like the patient is unable to be adequately monitored. Do not wear lotions, powders, perfumes, or deodorant. Do not shave 48 hours prior to surgery. Do not bring valuables to the hospital. [facility] is not responsible for any belongings or valuables. Wear Clean/Comfortable clothing the morning of surgery Remember to brush your teeth WITH YOUR REGULAR TOOTHPASTE.   Please read over the following fact sheets that you were given.   3 days prior to your procedure or After your COVID test   You are not required to quarantine however you are required to wear a well-fitting mask when you are out and around people not in your household. If your mask becomes wet or soiled, replace with a new one.   Wash your hands often with soap and water for 20 seconds or clean your hands with an alcohol-based hand sanitizer that contains at least 60% alcohol.   Do not share personal items.   Notify your provider:  o if you are in close contact with someone who has COVID  o or if you develop a fever of 100.4 or greater, sneezing, cough, sore throat, shortness of breath or body aches.

## 2021-01-04 ENCOUNTER — Encounter (HOSPITAL_COMMUNITY)
Admission: RE | Admit: 2021-01-04 | Discharge: 2021-01-04 | Disposition: A | Discharge status: Home / Self Care | Payer: BC Managed Care – PPO | Source: Ambulatory Visit | Attending: General Surgery | Admitting: General Surgery

## 2021-01-04 ENCOUNTER — Encounter (HOSPITAL_COMMUNITY): Payer: Self-pay

## 2021-01-04 ENCOUNTER — Other Ambulatory Visit: Payer: Self-pay

## 2021-01-04 VITALS — BP 124/78 | HR 83 | Temp 98.3°F | Resp 17 | Ht 64.0 in | Wt 128.8 lb

## 2021-01-04 DIAGNOSIS — Z01818 Encounter for other preprocedural examination: Secondary | ICD-10-CM

## 2021-01-04 DIAGNOSIS — Z01812 Encounter for preprocedural laboratory examination: Secondary | ICD-10-CM | POA: Insufficient documentation

## 2021-01-04 DIAGNOSIS — Z20822 Contact with and (suspected) exposure to covid-19: Secondary | ICD-10-CM | POA: Insufficient documentation

## 2021-01-04 HISTORY — DX: Personal history of other diseases of the digestive system: Z87.19

## 2021-01-04 LAB — CBC
HCT: 43.5 % (ref 36.0–46.0)
Hemoglobin: 14.5 g/dL (ref 12.0–15.0)
MCH: 30.5 pg (ref 26.0–34.0)
MCHC: 33.3 g/dL (ref 30.0–36.0)
MCV: 91.6 fL (ref 80.0–100.0)
Platelets: 246 10*3/uL (ref 150–400)
RBC: 4.75 MIL/uL (ref 3.87–5.11)
RDW: 12.9 % (ref 11.5–15.5)
WBC: 5.6 10*3/uL (ref 4.0–10.5)
nRBC: 0 % (ref 0.0–0.2)

## 2021-01-04 LAB — SARS CORONAVIRUS 2 (TAT 6-24 HRS): SARS Coronavirus 2: NEGATIVE

## 2021-01-04 NOTE — Progress Notes (Signed)
PCP - Maximiano Coss, NP Cardiologist - denies  PPM/ICD - n/a  Chest x-ray - n/a EKG - n/a Stress Test - denies ECHO - denies Cardiac Cath - denies  Sleep Study - denies CPAP - denies  Blood Thinner Instructions: n/a Aspirin Instructions: n/a  ERAS Protcol -Clear liquids until 0600 DOS. PRE-SURGERY Ensure or G2- Ensure  COVID TEST- 01/04/21 done in PAT.  Anesthesia review: No  Patient denies shortness of breath, fever, cough and chest pain at PAT appointment   All instructions explained to the patient, with a verbal understanding of the material. Patient agrees to go over the instructions while at home for a better understanding. Patient also instructed to self quarantine after being tested for COVID-19. The opportunity to ask questions was provided.

## 2021-01-05 ENCOUNTER — Ambulatory Visit
Admission: RE | Admit: 2021-01-05 | Discharge: 2021-01-05 | Disposition: A | Discharge status: Home / Self Care | Payer: BC Managed Care – PPO | Source: Ambulatory Visit | Attending: General Surgery | Admitting: General Surgery

## 2021-01-05 ENCOUNTER — Other Ambulatory Visit: Payer: Self-pay | Admitting: General Surgery

## 2021-01-05 DIAGNOSIS — C50412 Malignant neoplasm of upper-outer quadrant of left female breast: Secondary | ICD-10-CM

## 2021-01-05 DIAGNOSIS — Z170 Estrogen receptor positive status [ER+]: Secondary | ICD-10-CM

## 2021-01-05 IMAGING — US US PLC BREAST LOC DEV 1ST LESION INC US GUIDE*L*
1 series · 4 of 4 positions shown · non-contrast
Comparison: Previous exam(s).

CLINICAL DATA: Patient presents for ultrasound-guided radioactive
seed localization a metastatic left axillary lymph node prior to
surgical excision.

EXAM:
ULTRASOUND GUIDED RADIOACTIVE SEED LOCALIZATION OF THE LEFT AXILLA

[Series 1: us plc breast loc dev 1st lesion inc us guide*left · 0.07mm/px · 4 of 4 slices shown]
[im 1/4]
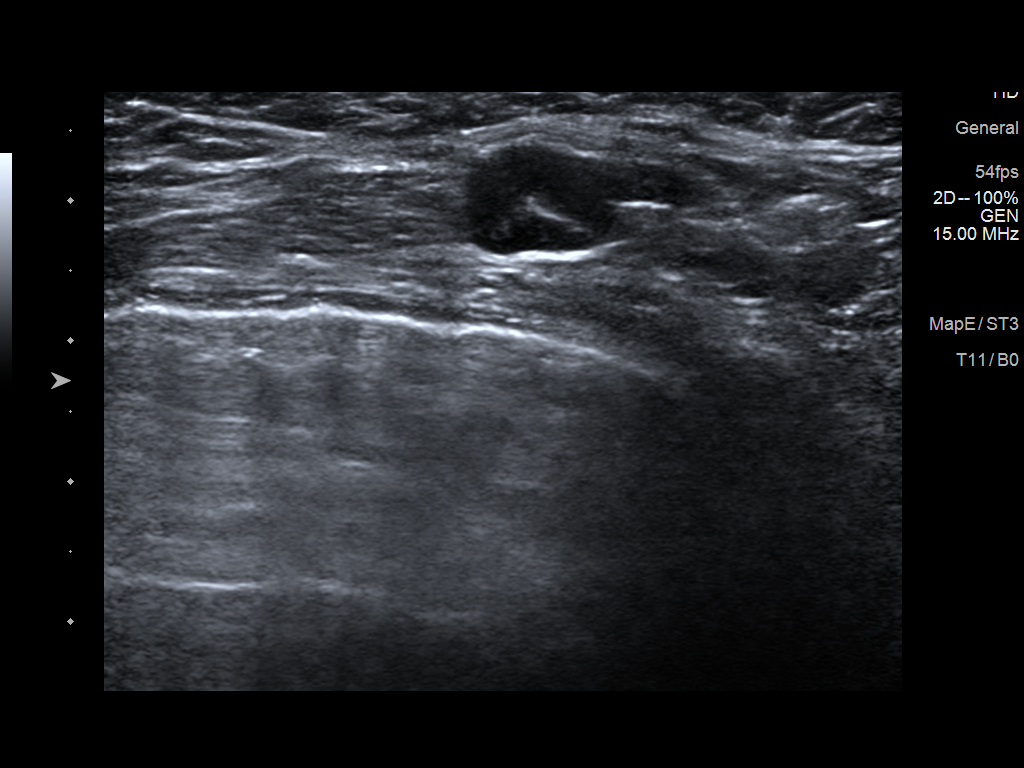
[im 2/4]
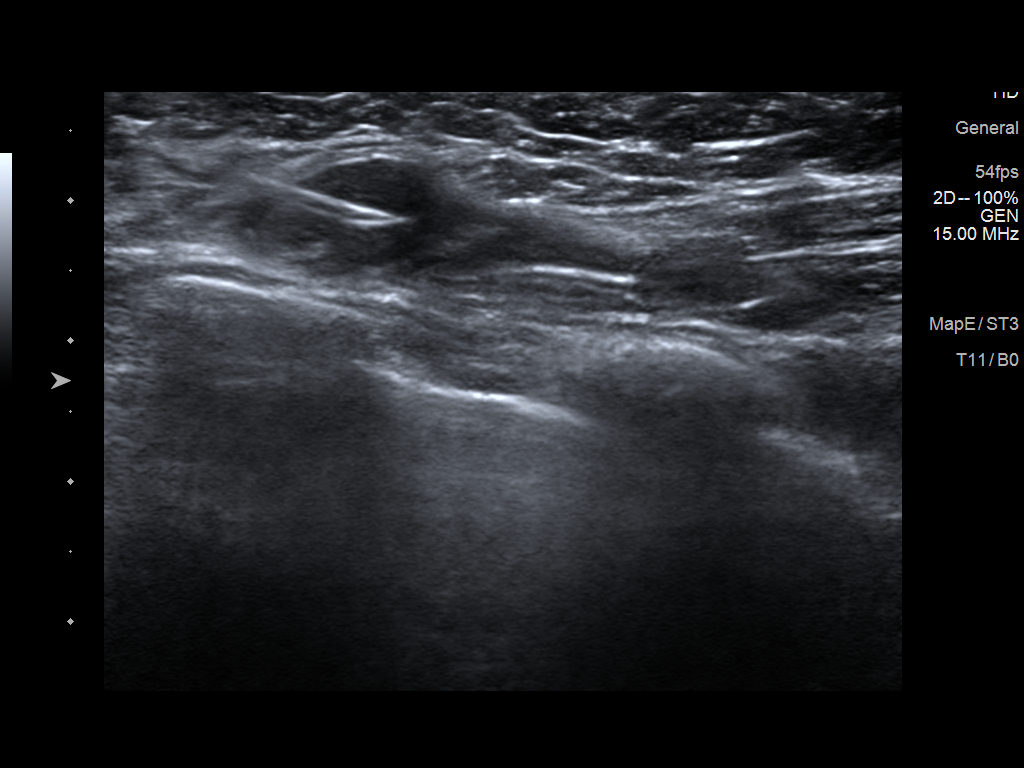
[im 3/4]
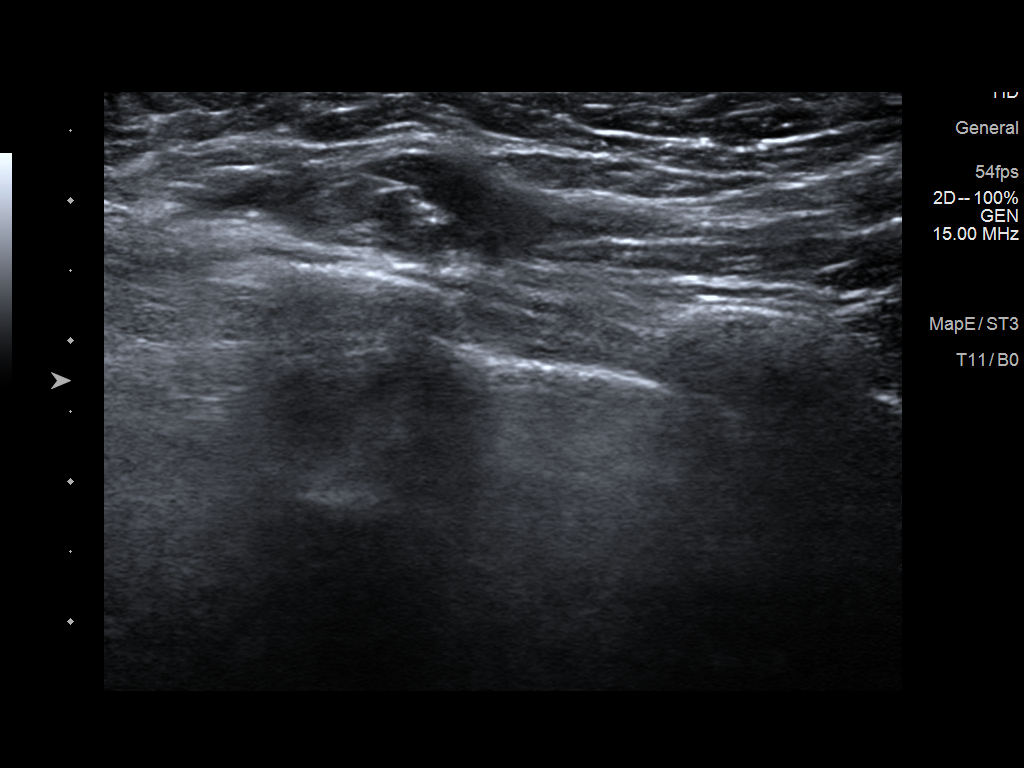
[im 4/4]
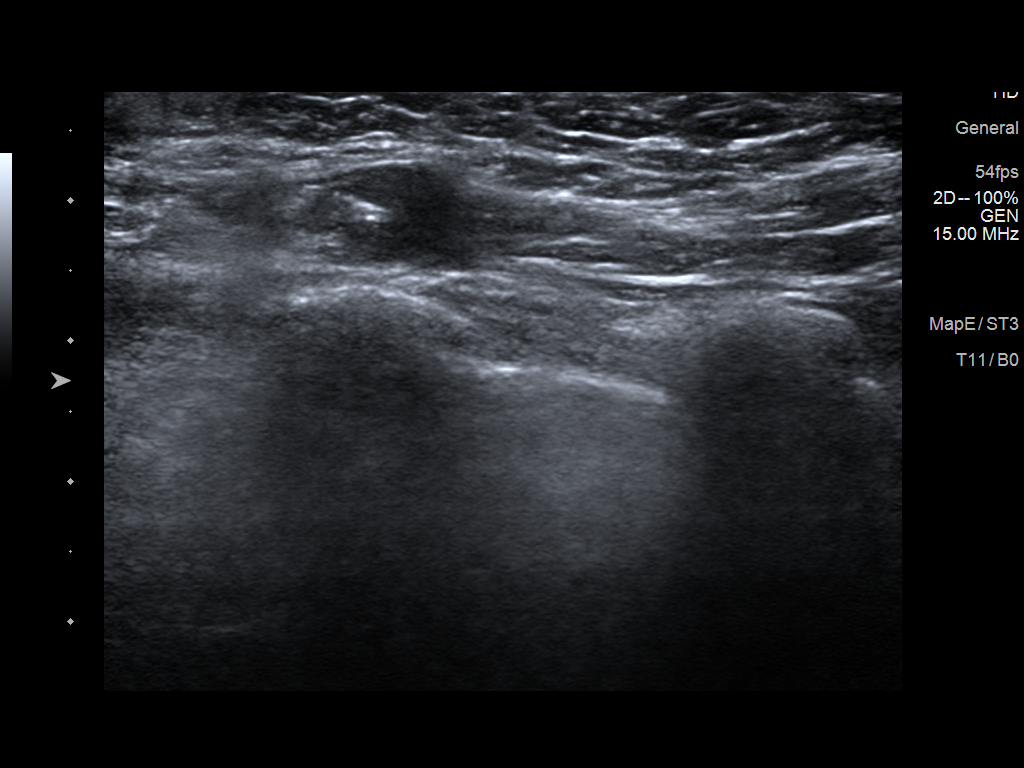

[4 of 4 positions shown; findings below may reference images not displayed]

FINDINGS: Patient presents for radioactive seed localization prior to surgical
excision. I met with the patient and we discussed the procedure of
seed localization including benefits and alternatives. We discussed
the high likelihood of a successful procedure. We discussed the
risks of the procedure including infection, bleeding, tissue injury
and further surgery. We discussed the low dose of radioactivity
involved in the procedure. Informed, written consent was given.

The usual time-out protocol was performed immediately prior to the
procedure.

Using ultrasound guidance, sterile technique, 1% lidocaine and an
[CM] radioactive seed, the abnormal left axillary lymph node and
associated post biopsy marker clip were localized using an inferior
approach. Seed was well seen deploying on ultrasound. No mammogram
obtained. Ultrasound images are available for Dr. NANTIP.

Follow-up survey of the patient confirms presence of the radioactive
seed.

Order number of [CM] seed:  [PHONE_NUMBER].

Total activity:  0.261 millicuries reference Date: [DATE]

The patient tolerated the procedure well and was released from the
[REDACTED]. She was given instructions regarding seed removal.
IMPRESSION: Radioactive seed localization of a left axillary lymph node. No
apparent complications.

## 2021-01-05 NOTE — H&P (Signed)
° °REFERRING PHYSICIAN: Jennifer Jarosz, MD ° °PROVIDER: Abb Gobert LEDFORD Johnice Riebe, MD ° °Care Team: Patient Care Team: °Morrow, Richard, NP as PCP - General °Tenleigh Byer Ledford, MD as Consulting Provider (Surgical Oncology) °Jarosz, Jennifer Ann, MD as Resident (Radiology) °Feng, Yan, MD (Hematology and Oncology) °Moody, John Scott, MD (Radiation Oncology)  ° °MRN: [id number] °DOB: 04/17/1980 ° °Subjective  ° °Chief Complaint: Breast Cancer ° ° °History of Present Illness: °Pamela Reyes is a 40 y.o. female who is seen today as an office consultation at the request of Dr. Self for evaluation of Breast Cancer °.  ° °Pt is a lovely 40 yo F who presents with a new diagnosis of left breast cancer 10/2020. She has a palpable mass that she has felt. She had a benign biopsy in 2017 that she thinks was the same area. She does think this mass has gotten larger. She underwent diagnostic imaging and was seen to have 4.5 cm mass at 12 o'clock with an abnormal axillary node. 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  ° °She is nulliparous and had menarche at age 10. She has used hormonal contraception for around 11 years total. She has a sister, paternal grandmother and paternal aunt with breast cancer. Maternal uncle had bladder cancer.  ° °Diagnostic mammogram/us 10/28/2020 ° °FINDINGS:  °There is masslike increased density predominantly involving the  °central left breast and corresponding to the palpable area of  °concern. The left breast appears progressively smaller with  °flattening of the left nipple. Initially questioned scattered  °calcifications in outer left breast are felt to be stable. Several  °lymph nodes are partially identified in the left axilla with  °possible cortical thickening. No suspicious masses or calcifications  °seen in the right breast.  ° °Physical examination reveals a large firm masslike area involving  °the entire central left breast.  ° °Targeted  ultrasound of the left breast was performed. There is a  °large ill-defined hypoechoic area/mass in the left breast at 12  °o'clock retroareolar measuring 4.5 x 2.3 x 3.8 cm. This is felt to  °correspond the masslike density involving the central left breast.  °There is a lymph node identified with cortical thickening in the  °left axilla.  ° °IMPRESSION:  °1. Suspicious palpable mass/hypoechoic area in the left breast at 12  °o'clock retroareolar measuring 4.5 cm.  ° °2.  Suspicious lymph node in the left axilla.  ° °RECOMMENDATION:  °1. Recommend ultrasound-guided biopsy of the mass in the left breast  °at 12 o'clock retroareolar.  ° °2. Recommend ultrasound-guided biopsy of the abnormal lymph node in  °the left axilla.  ° °I have discussed the findings and recommendations with the patient.  °If applicable, a reminder letter will be sent to the patient  °regarding the next appointment.  ° °BI-RADS CATEGORY  4: Suspicious.  ° °Pathology core needle biopsy: 11/05/20 °1. Breast, left, needle core biopsy, 12 o'clock subareolar, ribbon clip °- INVASIVE MAMMARY CARCINOMA. SEE NOTE °2. Lymph node, needle/core biopsy, left axilla, tribell clip °- METASTATIC CARCINOMA TO A LYMPH NODE. SEE NOTE °1. Carcinoma measures 1.5 cm in greatest linear dimension and appears grade 2 ° °Receptors: °The tumor cells are EQUIVOCAL for Her2 (2+). Her2 by FISH will be performed and results reported separately. °Estrogen Receptor: 70%, POSITIVE, STRONG-MODERATE STAINING INTENSITY °Progesterone Receptor: 80%, POSITIVE, MODERATE STAINING INTENSITY °Proliferation Marker Ki67: 1% ° °Review of Systems: °A complete review of systems was obtained from the patient. I   have reviewed this information and discussed as appropriate with the patient. See HPI as well for other ROS. ° °Review of Systems  °Cardiovascular: Positive for chest pain (left ribcage, left breast pain).  °Neurological: Positive for sensory change (numbness).  °Psychiatric/Behavioral:  The patient is nervous/anxious.  °All other systems reviewed and are negative. ° ° °Medical History: °Past Medical History:  °Diagnosis Date  ° Anxiety  ° Fibroid 10/27/2020  ° Hyperlipidemia  ° Peptic ulcer  °Date unknown  ° °Patient Active Problem List  °Diagnosis  ° Left breast mass  ° Breast pain, left  ° Malignant neoplasm of upper-outer quadrant of left breast in female, estrogen receptor positive (CMS-HCC)  ° 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 lymph node, left (CMS-HCC)  ° Family history of breast cancer  ° Dense breasts  ° °Past Surgical History:  °Procedure Laterality Date  ° wisdom teeth extraction N/A  °Unknown Date  ° ° °No Known Allergies ° °Current Outpatient Medications on File Prior to Visit  °Medication Sig Dispense Refill  ° norethindrone-ethinyl estradiol-iron (JUNEL FE 1.5/30, 28,) 1.5 mg-30 mcg (21)/75 mg (7) tablet Take 1 tablet by mouth once daily  ° rosuvastatin (CRESTOR) 10 MG tablet Take 1 tablet by mouth once daily  ° °No current facility-administered medications on file prior to visit.  ° °Family History  °Problem Relation Age of Onset  ° Heart failure Father  ° Breast cancer Sister  ° Breast cancer Maternal Aunt  ° Stroke Maternal Grandmother  ° Lung cancer Maternal Grandfather  ° Breast cancer Paternal Grandmother  ° ° °Social History  ° °Tobacco Use  °Smoking Status Former Smoker  ° Types: Cigarettes  °Smokeless Tobacco Never Used  °Tobacco Comment  °Teen years, mildly smoked cigarettes  ° ° °Social History  ° °Socioeconomic History  ° Marital status: Unknown  °Tobacco Use  ° Smoking status: Former Smoker  °Types: Cigarettes  ° Smokeless tobacco: Never Used  ° Tobacco comment: Teen years, mildly smoked cigarettes  °Vaping Use  ° Vaping Use: Never used  °Substance and Sexual Activity  ° Alcohol use: Yes  °Comment: 2-3 drinks 3-4 nights a week  ° Drug use: Never  ° °Objective:  ° °Vitals:   °BP: 132/71  °Pulse: 91  °Resp: 18  °Temp: 36.9 °C (98.5 °F)  °Weight: 56.9 kg (125 lb 6.4 oz)   °Height: 162.6 cm (5' 4")  ° °Body mass index is 21.52 kg/m². ° °Gen: No acute distress. Well nourished and well groomed.  °Neurological: Alert and oriented to person, place, and time. Coordination normal.  °Head: Normocephalic and atraumatic.  °Eyes: Conjunctivae are normal. Pupils are equal, round, and reactive to light. No scleral icterus.  °Neck: Normal range of motion. Neck supple. No tracheal deviation or thyromegaly present.  °Cardiovascular: Normal rate, regular rhythm, normal heart sounds and intact distal pulses. Exam reveals no gallop and no friction rub. No murmur heard. °Breast: Breasts are very dense bilaterally. Minimal ptosis. Palpable mass at 12 o'clock on left. Contour altered in this location. Firm, around 4 cm. No palpable LAD. No nipple retraction. No palpable masses on the right.  °Respiratory: Effort normal. No respiratory distress. No chest wall tenderness. Breath sounds normal. No wheezes, rales or rhonchi.  °GI: Soft. Bowel sounds are normal. The abdomen is soft and nontender. There is no rebound and no guarding.  °Musculoskeletal: Normal range of motion. Extremities are nontender.  °Lymphadenopathy: No cervical, preauricular, postauricular or axillary adenopathy is present °Skin: Skin is warm and dry. No rash   noted. No diaphoresis. No erythema. No pallor. No clubbing, cyanosis, or edema.  °Psychiatric: Normal mood and affect. Behavior is normal. Judgment and thought content normal.  ° °Labs °CBC and CMET normal 9/16 other than slightly low alk phos at 24.  ° °Assessment and Plan:  ° °Malignant neoplasm of upper-outer quadrant of left breast in female, estrogen receptor positive (CMS-HCC) °Given patient's dense breasts, we will obtain breast MRI to evaluate the remainder of the left breast and the right breast. Additionally, we will obtain genetic testing. She has significant family history of breast cancer.  ° °Unless the her 2 comes back positive, I would not expect this to respond well  to chemotherapy in a neoadjuvant setting. Because of this and breast size, I will plan left mastectomy with seed targeted lymph node biopsy and SLN bx.  ° °I will refer to plastic surgery to consider reconstruction.  ° °Assuming staging scans and her 2 are negative, we would do surgery, mammaprint and chemo if needed, radiation, and antihormonal tx.  ° °I discussed mastectomy and seed targeted/sentinel node biopsy including the pre op, the procedure itself, the overnight stay, drain placement, and recovery. I discussed that if she gets reconstruction, plastic surgery would dictate time off work. I reviewed risks of bleeding, infection, loss of reconstruction, possible need to go back to the OR and possible need for blood transfusion, possible dissatisfaction with scars, possible recurrent cancer, possible chronic pain, and more. We will update plan as soon as MR, genetics, and staging studies are available.  ° °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 lymph node, left (CMS-HCC) °Given the length of time this mass has been present, the positive node, and the rib pain, we will obtain staging studies.  ° °Family history of breast cancer °Genetic testing/counseling today.  ° °Dense breasts °Pt has very dense breasts and presumably this cancer was obscured by this in the last 1-2 mammograms. If we don't do contralateral mastectomies, patient will need annual MRI.  ° °Findley Vi L Tiernan Suto, MD FACS °Surgical Oncology, General Surgery, Trauma and Critical Care °[facility] °A [facility] °

## 2021-01-05 NOTE — H&P (View-Only) (Signed)
REFERRING PHYSICIAN: Everlean Alstrom, MD  PROVIDER: Georgianne Fick, MD  Care Team: Patient Care Team: Maximiano Coss, NP as PCP - General Barry Dienes, Ballard Russell, MD as Consulting Provider (Surgical Oncology) Wendelyn Breslow, MD as Resident (Radiology) [name], MD (Hematology and Oncology) Marye Round, MD (Radiation Oncology)   MRN: [id number] DOB: [birth year]  Subjective   Chief Complaint: Breast Cancer   History of Present Illness: Pamela Reyes is a 40 y.o. female who is seen today as an office consultation at the request of Dr. Pasty Arch for evaluation of Breast Cancer .   Pt is a lovely 40 yo F who presents with a new diagnosis of left breast cancer 10/2020. She has a palpable mass that she has felt. She had a benign biopsy in 2017 that she thinks was the same area. She does think this mass has gotten larger. She underwent diagnostic imaging and was seen to have 4.5 cm mass at 12 o'clock with an abnormal axillary node. 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   She is nulliparous and had menarche at age 57. She has used hormonal contraception for around 11 years total. She has a sister, paternal grandmother and paternal aunt with breast cancer. Maternal uncle had bladder cancer.   Diagnostic mammogram/us 10/28/2020  FINDINGS:  There is masslike increased density predominantly involving the  central left breast and corresponding to the palpable area of  concern. The left breast appears progressively smaller with  flattening of the left nipple. Initially questioned scattered  calcifications in outer left breast are felt to be stable. Several  lymph nodes are partially identified in the left axilla with  possible cortical thickening. No suspicious masses or calcifications  seen in the right breast.   Physical examination reveals a large firm masslike area involving  the entire central left breast.   Targeted  ultrasound of the left breast was performed. There is a  large ill-defined hypoechoic area/mass in the left breast at 12  o'clock retroareolar measuring 4.5 x 2.3 x 3.8 cm. This is felt to  correspond the masslike density involving the central left breast.  There is a lymph node identified with cortical thickening in the  left axilla.   IMPRESSION:  1. Suspicious palpable mass/hypoechoic area in the left breast at 12  o'clock retroareolar measuring 4.5 cm.   2.  Suspicious lymph node in the left axilla.   RECOMMENDATION:  1. Recommend ultrasound-guided biopsy of the mass in the left breast  at 12 o'clock retroareolar.   2. Recommend ultrasound-guided biopsy of the abnormal lymph node in  the left axilla.   I have discussed the findings and recommendations with the patient.  If applicable, a reminder letter will be sent to the patient  regarding the next appointment.   BI-RADS CATEGORY  4: Suspicious.   Pathology core needle biopsy: 11/05/20 1. Breast, left, needle core biopsy, 12 o'clock subareolar, ribbon clip - INVASIVE MAMMARY CARCINOMA. SEE NOTE 2. Lymph node, needle/core biopsy, left axilla, tribell clip - METASTATIC CARCINOMA TO A LYMPH NODE. SEE NOTE 1. Carcinoma measures 1.5 cm in greatest linear dimension and appears grade 2  Receptors: The tumor cells are EQUIVOCAL for Her2 (2+). Her2 by FISH will be performed and results reported separately. Estrogen Receptor: 70%, POSITIVE, STRONG-MODERATE STAINING INTENSITY Progesterone Receptor: 80%, POSITIVE, MODERATE STAINING INTENSITY Proliferation Marker Ki67: 1%  Review of Systems: A complete review of systems was obtained from the patient.  I have reviewed this information and discussed as appropriate with the patient. See HPI as well for other ROS.  Review of Systems  Cardiovascular: Positive for chest pain (left ribcage, left breast pain).  Neurological: Positive for sensory change (numbness).  Psychiatric/Behavioral:  The patient is nervous/anxious.  All other systems reviewed and are negative.   Medical History: Past Medical History:  Diagnosis Date   Anxiety   Fibroid 10/27/2020   Hyperlipidemia   Peptic ulcer  Date unknown   Patient Active Problem List  Diagnosis   Left breast mass   Breast pain, left   Malignant neoplasm of upper-outer quadrant of left breast in female, estrogen receptor positive (CMS-HCC)   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 lymph node, left (CMS-HCC)   Family history of breast cancer   Dense breasts   Past Surgical History:  Procedure Laterality Date   wisdom teeth extraction N/A  Unknown Date    No Known Allergies  Current Outpatient Medications on File Prior to Visit  Medication Sig Dispense Refill   norethindrone-ethinyl estradiol-iron (JUNEL FE 1.5/30, 28,) 1.5 mg-30 mcg (21)/75 mg (7) tablet Take 1 tablet by mouth once daily   rosuvastatin (CRESTOR) 10 MG tablet Take 1 tablet by mouth once daily   No current facility-administered medications on file prior to visit.   Family History  Problem Relation Age of Onset   Heart failure Father   Breast cancer Sister   Breast cancer Maternal Aunt   Stroke Maternal Grandmother   Lung cancer Maternal Grandfather   Breast cancer Paternal Grandmother    Social History   Tobacco Use  Smoking Status Former Smoker   Types: Cigarettes  Smokeless Tobacco Never Used  Tobacco Comment  Teen years, mildly smoked cigarettes    Social History   Socioeconomic History   Marital status: Unknown  Tobacco Use   Smoking status: Former Smoker  Types: Cigarettes   Smokeless tobacco: Never Used   Tobacco comment: Teen years, mildly smoked cigarettes  Vaping Use   Vaping Use: Never used  Substance and Sexual Activity   Alcohol use: Yes  Comment: 2-3 drinks 3-4 nights a week   Drug use: Never   Objective:   Vitals:   BP: 132/71  Pulse: 91  Resp: 18  Temp: 36.9 C (98.5 F)  Weight: 56.9 kg (125 lb 6.4 oz)   Height: 162.6 cm (5' 4")   Body mass index is 21.52 kg/m.  Gen: No acute distress. Well nourished and well groomed.  Neurological: Alert and oriented to person, place, and time. Coordination normal.  Head: Normocephalic and atraumatic.  Eyes: Conjunctivae are normal. Pupils are equal, round, and reactive to light. No scleral icterus.  Neck: Normal range of motion. Neck supple. No tracheal deviation or thyromegaly present.  Cardiovascular: Normal rate, regular rhythm, normal heart sounds and intact distal pulses. Exam reveals no gallop and no friction rub. No murmur heard. Breast: Breasts are very dense bilaterally. Minimal ptosis. Palpable mass at 12 o'clock on left. Contour altered in this location. Firm, around 4 cm. No palpable LAD. No nipple retraction. No palpable masses on the right.  Respiratory: Effort normal. No respiratory distress. No chest wall tenderness. Breath sounds normal. No wheezes, rales or rhonchi.  GI: Soft. Bowel sounds are normal. The abdomen is soft and nontender. There is no rebound and no guarding.  Musculoskeletal: Normal range of motion. Extremities are nontender.  Lymphadenopathy: No cervical, preauricular, postauricular or axillary adenopathy is present Skin: Skin is warm and dry. No  rash noted. No diaphoresis. No erythema. No pallor. No clubbing, cyanosis, or edema.  Psychiatric: Normal mood and affect. Behavior is normal. Judgment and thought content normal.   Labs CBC and CMET normal 9/16 other than slightly low alk phos at 24.   Assessment and Plan:   Malignant neoplasm of upper-outer quadrant of left breast in female, estrogen receptor positive (CMS-HCC) Given patient's dense breasts, we will obtain breast MRI to evaluate the remainder of the left breast and the right breast. Additionally, we will obtain genetic testing. She has significant family history of breast cancer.   Unless the her 2 comes back positive, I would not expect this to respond well  to chemotherapy in a neoadjuvant setting. Because of this and breast size, I will plan left mastectomy with seed targeted lymph node biopsy and SLN bx.   I will refer to plastic surgery to consider reconstruction.   Assuming staging scans and her 2 are negative, we would do surgery, mammaprint and chemo if needed, radiation, and antihormonal tx.   I discussed mastectomy and seed targeted/sentinel node biopsy including the pre op, the procedure itself, the overnight stay, drain placement, and recovery. I discussed that if she gets reconstruction, plastic surgery would dictate time off work. I reviewed risks of bleeding, infection, loss of reconstruction, possible need to go back to the OR and possible need for blood transfusion, possible dissatisfaction with scars, possible recurrent cancer, possible chronic pain, and more. We will update plan as soon as MR, genetics, and staging studies are available.   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 lymph node, left (CMS-HCC) Given the length of time this mass has been present, the positive node, and the rib pain, we will obtain staging studies.   Family history of breast cancer Genetic testing/counseling today.   Dense breasts Pt has very dense breasts and presumably this cancer was obscured by this in the last 1-2 mammograms. If we don't do contralateral mastectomies, patient will need annual MRI.   Milus Height, MD FACS Surgical Oncology, General Surgery, Trauma and [facility] [place]

## 2021-01-06 ENCOUNTER — Ambulatory Visit
Admission: RE | Admit: 2021-01-06 | Discharge: 2021-01-06 | Disposition: A | Discharge status: Home / Self Care | Payer: BC Managed Care – PPO | Source: Ambulatory Visit | Attending: General Surgery | Admitting: General Surgery

## 2021-01-06 ENCOUNTER — Observation Stay (HOSPITAL_COMMUNITY)
Admission: RE | Admit: 2021-01-06 | Discharge: 2021-01-07 | Disposition: A | Discharge status: Home / Self Care | Payer: BC Managed Care – PPO | Attending: Plastic Surgery | Admitting: Plastic Surgery

## 2021-01-06 ENCOUNTER — Encounter (HOSPITAL_COMMUNITY): Payer: Self-pay | Admitting: General Surgery

## 2021-01-06 ENCOUNTER — Ambulatory Visit (HOSPITAL_COMMUNITY): Payer: BC Managed Care – PPO | Admitting: Anesthesiology

## 2021-01-06 ENCOUNTER — Other Ambulatory Visit: Payer: Self-pay

## 2021-01-06 ENCOUNTER — Encounter (HOSPITAL_COMMUNITY)
Admission: RE | Disposition: A | Discharge status: Home / Self Care | Payer: Self-pay | Source: Home / Self Care | Attending: Plastic Surgery

## 2021-01-06 ENCOUNTER — Ambulatory Visit (HOSPITAL_COMMUNITY)
Admission: RE | Admit: 2021-01-06 | Discharge: 2021-01-06 | Disposition: A | Discharge status: Home / Self Care | Payer: BC Managed Care – PPO | Source: Ambulatory Visit | Attending: General Surgery | Admitting: General Surgery

## 2021-01-06 DIAGNOSIS — C50412 Malignant neoplasm of upper-outer quadrant of left female breast: Secondary | ICD-10-CM | POA: Diagnosis present

## 2021-01-06 DIAGNOSIS — C773 Secondary and unspecified malignant neoplasm of axilla and upper limb lymph nodes: Secondary | ICD-10-CM | POA: Insufficient documentation

## 2021-01-06 DIAGNOSIS — Z87891 Personal history of nicotine dependence: Secondary | ICD-10-CM | POA: Insufficient documentation

## 2021-01-06 DIAGNOSIS — C50912 Malignant neoplasm of unspecified site of left female breast: Secondary | ICD-10-CM | POA: Diagnosis present

## 2021-01-06 DIAGNOSIS — Z170 Estrogen receptor positive status [ER+]: Secondary | ICD-10-CM

## 2021-01-06 HISTORY — PX: SENTINEL NODE BIOPSY: SHX6608

## 2021-01-06 HISTORY — PX: MASTECTOMY W/ SENTINEL NODE BIOPSY: SHX2001

## 2021-01-06 HISTORY — PX: RADIOACTIVE SEED GUIDED AXILLARY SENTINEL LYMPH NODE: SHX6735

## 2021-01-06 HISTORY — PX: BREAST RECONSTRUCTION WITH PLACEMENT OF TISSUE EXPANDER AND FLEX HD (ACELLULAR HYDRATED DERMIS): SHX6295

## 2021-01-06 LAB — POCT PREGNANCY, URINE: Preg Test, Ur: NEGATIVE

## 2021-01-06 IMAGING — MG MM BREAST SURGICAL SPECIMEN
1 series · 1 of 1 positions shown · non-contrast
Comparison: Previous exam(s).

CLINICAL DATA: Surgical excision of a left breast lymph node
following radioactive seed localization.

EXAM:
SPECIMEN RADIOGRAPH OF THE LEFT AXILLA

[L]
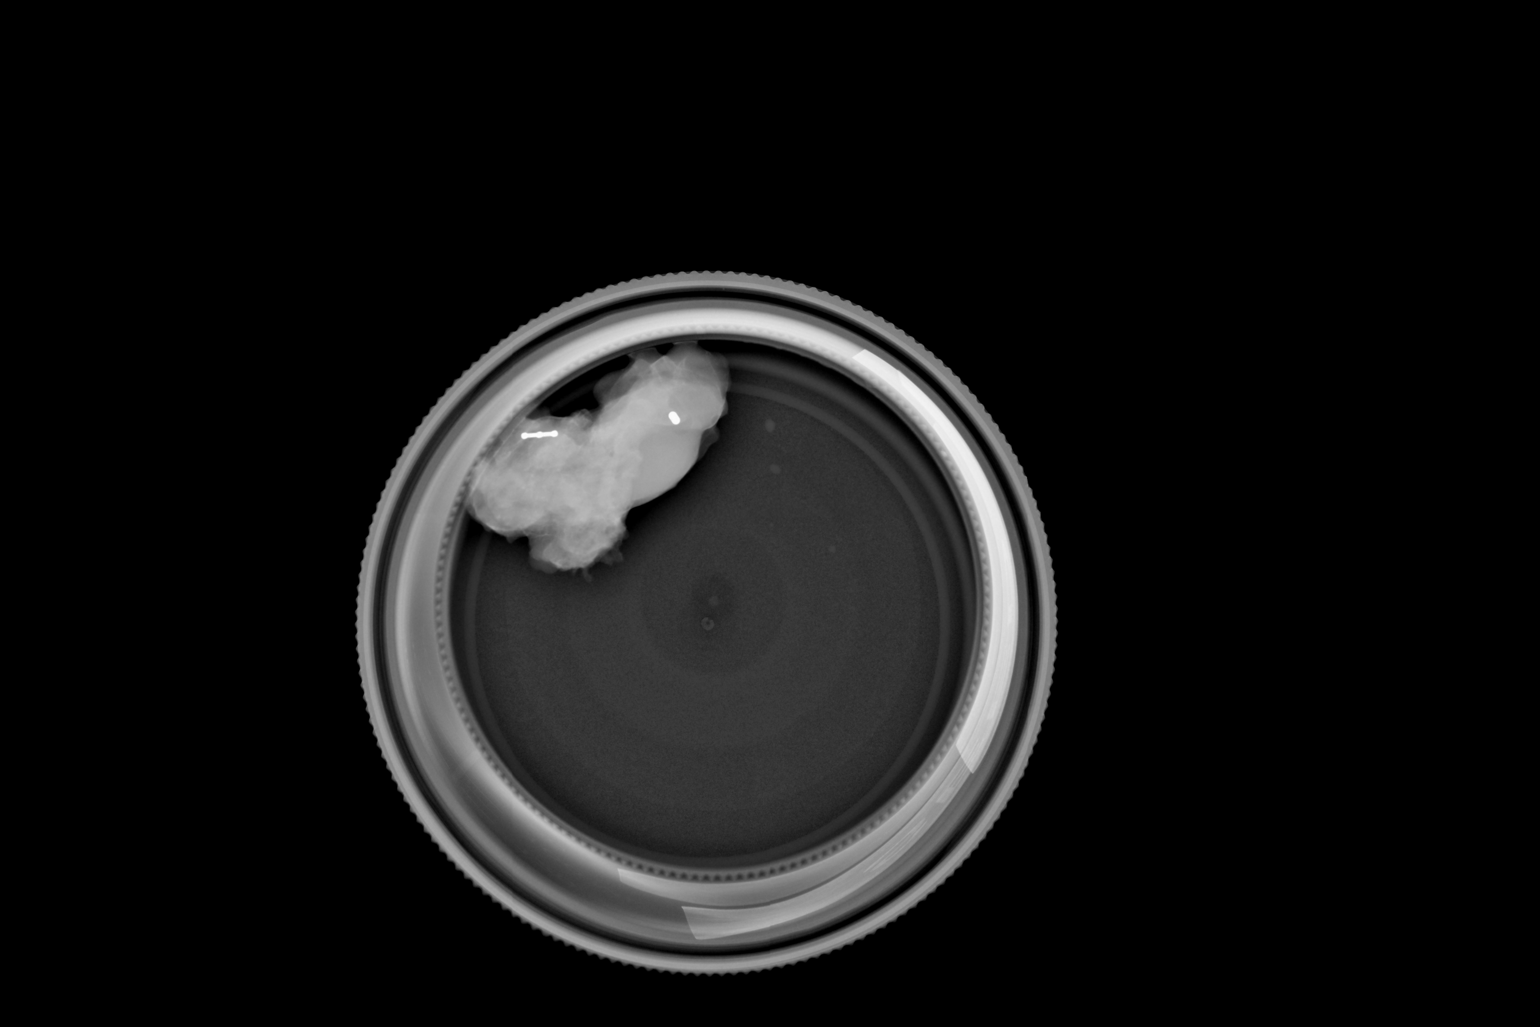

[1 of 1 positions shown; findings below may reference images not displayed]

FINDINGS: Status post excision of the left axilla. The radioactive seed and
biopsy marker clip are present, completely intact, and were marked
for pathology.
IMPRESSION: Specimen radiograph of the left axilla.

## 2021-01-06 SURGERY — MASTECTOMY WITH SENTINEL LYMPH NODE BIOPSY
Anesthesia: Regional | Site: Breast | Laterality: Left

## 2021-01-06 MED ORDER — PHENYLEPHRINE 40 MCG/ML (10ML) SYRINGE FOR IV PUSH (FOR BLOOD PRESSURE SUPPORT)
PREFILLED_SYRINGE | INTRAVENOUS | Status: AC
  Filled 2021-01-06: qty 10

## 2021-01-06 MED ORDER — PROPOFOL 10 MG/ML IV BOLUS
INTRAVENOUS | Status: AC
  Filled 2021-01-06: qty 20

## 2021-01-06 MED ORDER — BUPIVACAINE-EPINEPHRINE (PF) 0.25% -1:200000 IJ SOLN
INTRAMUSCULAR | Status: DC | PRN
  Administered 2021-01-06: 30 mL

## 2021-01-06 MED ORDER — PHENYLEPHRINE 40 MCG/ML (10ML) SYRINGE FOR IV PUSH (FOR BLOOD PRESSURE SUPPORT)
PREFILLED_SYRINGE | INTRAVENOUS | Status: DC | PRN
  Administered 2021-01-06 (×9): 80 ug via INTRAVENOUS

## 2021-01-06 MED ORDER — FENTANYL CITRATE (PF) 250 MCG/5ML IJ SOLN
INTRAMUSCULAR | Status: AC
  Filled 2021-01-06: qty 5

## 2021-01-06 MED ORDER — MIDAZOLAM HCL 2 MG/2ML IJ SOLN
INTRAMUSCULAR | Status: AC
  Filled 2021-01-06: qty 2

## 2021-01-06 MED ORDER — MAGTRACE LYMPHATIC TRACER
INTRAMUSCULAR | Status: DC | PRN
  Administered 2021-01-06: 3 mL via INTRAMUSCULAR

## 2021-01-06 MED ORDER — ONDANSETRON HCL 4 MG/2ML IJ SOLN
4.0000 mg | Freq: Four times a day (QID) | INTRAMUSCULAR | Status: DC | PRN
  Administered 2021-01-07: 4 mg via INTRAVENOUS
  Filled 2021-01-06: qty 2

## 2021-01-06 MED ORDER — EPHEDRINE 5 MG/ML INJ
INTRAVENOUS | Status: AC
  Filled 2021-01-06: qty 5

## 2021-01-06 MED ORDER — ROCURONIUM BROMIDE 10 MG/ML (PF) SYRINGE
PREFILLED_SYRINGE | INTRAVENOUS | Status: DC | PRN
  Administered 2021-01-06: 40 mg via INTRAVENOUS
  Administered 2021-01-06: 20 mg via INTRAVENOUS

## 2021-01-06 MED ORDER — ONDANSETRON HCL 4 MG/2ML IJ SOLN
INTRAMUSCULAR | Status: DC | PRN
  Administered 2021-01-06: 4 mg via INTRAVENOUS

## 2021-01-06 MED ORDER — METHYLENE BLUE 0.5 % INJ SOLN
INTRAVENOUS | Status: AC
  Filled 2021-01-06: qty 10

## 2021-01-06 MED ORDER — ROCURONIUM BROMIDE 10 MG/ML (PF) SYRINGE
PREFILLED_SYRINGE | INTRAVENOUS | Status: AC
  Filled 2021-01-06: qty 10

## 2021-01-06 MED ORDER — CHLORHEXIDINE GLUCONATE CLOTH 2 % EX PADS: 6.0000 | MEDICATED_PAD | Freq: Once | CUTANEOUS | Status: DC

## 2021-01-06 MED ORDER — CHLORHEXIDINE GLUCONATE 0.12 % MT SOLN
OROMUCOSAL | Status: AC
  Administered 2021-01-06: 15 mL via OROMUCOSAL
  Filled 2021-01-06: qty 15

## 2021-01-06 MED ORDER — LIDOCAINE 2% (20 MG/ML) 5 ML SYRINGE
INTRAMUSCULAR | Status: DC | PRN
  Administered 2021-01-06: 20 mg via INTRAVENOUS

## 2021-01-06 MED ORDER — LACTATED RINGERS IV SOLN: INTRAVENOUS | Status: DC

## 2021-01-06 MED ORDER — FENTANYL CITRATE (PF) 250 MCG/5ML IJ SOLN
INTRAMUSCULAR | Status: DC | PRN
  Administered 2021-01-06 (×2): 50 ug via INTRAVENOUS

## 2021-01-06 MED ORDER — DEXAMETHASONE SODIUM PHOSPHATE 10 MG/ML IJ SOLN
INTRAMUSCULAR | Status: DC | PRN
  Administered 2021-01-06: 5 mg via INTRAVENOUS

## 2021-01-06 MED ORDER — FENTANYL CITRATE (PF) 100 MCG/2ML IJ SOLN
INTRAMUSCULAR | Status: AC
  Administered 2021-01-06: 50 ug via INTRAVENOUS
  Filled 2021-01-06: qty 2

## 2021-01-06 MED ORDER — ONDANSETRON 4 MG PO TBDP: 4.0000 mg | ORAL_TABLET | Freq: Four times a day (QID) | ORAL | Status: DC | PRN

## 2021-01-06 MED ORDER — CEFAZOLIN SODIUM-DEXTROSE 1-4 GM/50ML-% IV SOLN
1.0000 g | Freq: Three times a day (TID) | INTRAVENOUS | Status: AC
  Administered 2021-01-06 – 2021-01-07 (×3): 1 g via INTRAVENOUS
  Filled 2021-01-06 (×4): qty 50

## 2021-01-06 MED ORDER — ACETAMINOPHEN 500 MG PO TABS
ORAL_TABLET | ORAL | Status: AC
  Administered 2021-01-06: 1000 mg via ORAL
  Filled 2021-01-06: qty 2

## 2021-01-06 MED ORDER — MIDAZOLAM HCL 2 MG/2ML IJ SOLN: 2.0000 mg | Freq: Once | INTRAMUSCULAR | Status: AC

## 2021-01-06 MED ORDER — HYDROMORPHONE HCL 1 MG/ML IJ SOLN
0.5000 mg | INTRAMUSCULAR | Status: DC | PRN
  Administered 2021-01-06 – 2021-01-07 (×2): 0.5 mg via INTRAVENOUS
  Filled 2021-01-06 (×3): qty 0.5

## 2021-01-06 MED ORDER — SUGAMMADEX SODIUM 200 MG/2ML IV SOLN
INTRAVENOUS | Status: DC | PRN
  Administered 2021-01-06: 115 mg via INTRAVENOUS

## 2021-01-06 MED ORDER — ORAL CARE MOUTH RINSE: 15.0000 mL | Freq: Once | OROMUCOSAL | Status: AC

## 2021-01-06 MED ORDER — ONDANSETRON HCL 4 MG/2ML IJ SOLN
INTRAMUSCULAR | Status: AC
  Filled 2021-01-06: qty 2

## 2021-01-06 MED ORDER — GABAPENTIN 100 MG PO CAPS
ORAL_CAPSULE | ORAL | Status: AC
  Administered 2021-01-06: 100 mg via ORAL
  Filled 2021-01-06: qty 1

## 2021-01-06 MED ORDER — AMISULPRIDE (ANTIEMETIC) 5 MG/2ML IV SOLN
10.0000 mg | Freq: Once | INTRAVENOUS | Status: AC | PRN
  Administered 2021-01-06: 10 mg via INTRAVENOUS

## 2021-01-06 MED ORDER — ENOXAPARIN SODIUM 40 MG/0.4ML IJ SOSY
40.0000 mg | PREFILLED_SYRINGE | INTRAMUSCULAR | Status: DC
  Administered 2021-01-07: 40 mg via SUBCUTANEOUS
  Filled 2021-01-06: qty 0.4

## 2021-01-06 MED ORDER — TECHNETIUM TC 99M TILMANOCEPT KIT
1.0000 | PACK | Freq: Once | INTRAVENOUS | Status: AC | PRN
  Administered 2021-01-06: 1 via INTRADERMAL

## 2021-01-06 MED ORDER — KCL IN DEXTROSE-NACL 20-5-0.45 MEQ/L-%-% IV SOLN
INTRAVENOUS | Status: DC
  Filled 2021-01-06: qty 1000

## 2021-01-06 MED ORDER — METHOCARBAMOL 500 MG PO TABS
500.0000 mg | ORAL_TABLET | Freq: Three times a day (TID) | ORAL | Status: DC | PRN
  Administered 2021-01-06: 500 mg via ORAL
  Filled 2021-01-06 (×2): qty 1

## 2021-01-06 MED ORDER — SODIUM CHLORIDE (PF) 0.9 % IJ SOLN
INTRAMUSCULAR | Status: AC
  Filled 2021-01-06: qty 10

## 2021-01-06 MED ORDER — SODIUM CHLORIDE 0.9 % IV SOLN
INTRAVENOUS | Status: DC | PRN
  Administered 2021-01-06: 500 mL

## 2021-01-06 MED ORDER — 0.9 % SODIUM CHLORIDE (POUR BTL) OPTIME
TOPICAL | Status: DC | PRN
  Administered 2021-01-06: 1000 mL

## 2021-01-06 MED ORDER — CHLORHEXIDINE GLUCONATE 0.12 % MT SOLN: 15.0000 mL | Freq: Once | OROMUCOSAL | Status: AC

## 2021-01-06 MED ORDER — DIPHENHYDRAMINE HCL 50 MG/ML IJ SOLN
INTRAMUSCULAR | Status: AC
  Filled 2021-01-06: qty 1

## 2021-01-06 MED ORDER — FENTANYL CITRATE (PF) 100 MCG/2ML IJ SOLN: 50.0000 ug | Freq: Once | INTRAMUSCULAR | Status: AC

## 2021-01-06 MED ORDER — ENSURE PRE-SURGERY PO LIQD: 296.0000 mL | Freq: Once | ORAL | Status: DC

## 2021-01-06 MED ORDER — PROPOFOL 10 MG/ML IV BOLUS
INTRAVENOUS | Status: DC | PRN
  Administered 2021-01-06: 140 mg via INTRAVENOUS

## 2021-01-06 MED ORDER — FENTANYL CITRATE (PF) 100 MCG/2ML IJ SOLN: 25.0000 ug | INTRAMUSCULAR | Status: DC | PRN

## 2021-01-06 MED ORDER — DIPHENHYDRAMINE HCL 50 MG/ML IJ SOLN
INTRAMUSCULAR | Status: DC | PRN
  Administered 2021-01-06: 12.5 mg via INTRAVENOUS

## 2021-01-06 MED ORDER — CEFAZOLIN SODIUM-DEXTROSE 2-4 GM/100ML-% IV SOLN
INTRAVENOUS | Status: AC
  Filled 2021-01-06: qty 100

## 2021-01-06 MED ORDER — AMISULPRIDE (ANTIEMETIC) 5 MG/2ML IV SOLN
INTRAVENOUS | Status: AC
  Filled 2021-01-06: qty 4

## 2021-01-06 MED ORDER — EPHEDRINE SULFATE-NACL 50-0.9 MG/10ML-% IV SOSY
PREFILLED_SYRINGE | INTRAVENOUS | Status: DC | PRN
  Administered 2021-01-06 (×2): 5 mg via INTRAVENOUS

## 2021-01-06 MED ORDER — CEFAZOLIN SODIUM-DEXTROSE 2-4 GM/100ML-% IV SOLN
2.0000 g | INTRAVENOUS | Status: AC
  Administered 2021-01-06: 2 g via INTRAVENOUS

## 2021-01-06 MED ORDER — ACETAMINOPHEN 500 MG PO TABS: 1000.0000 mg | ORAL_TABLET | ORAL | Status: AC

## 2021-01-06 MED ORDER — LIDOCAINE 2% (20 MG/ML) 5 ML SYRINGE
INTRAMUSCULAR | Status: AC
  Filled 2021-01-06: qty 10

## 2021-01-06 MED ORDER — DEXAMETHASONE SODIUM PHOSPHATE 10 MG/ML IJ SOLN
INTRAMUSCULAR | Status: AC
  Filled 2021-01-06: qty 1

## 2021-01-06 MED ORDER — KETOROLAC TROMETHAMINE 30 MG/ML IJ SOLN
INTRAMUSCULAR | Status: AC
  Filled 2021-01-06: qty 1

## 2021-01-06 MED ORDER — CLONIDINE HCL (ANALGESIA) 100 MCG/ML EP SOLN
EPIDURAL | Status: DC | PRN
  Administered 2021-01-06: 100 ug

## 2021-01-06 MED ORDER — GABAPENTIN 100 MG PO CAPS: 100.0000 mg | ORAL_CAPSULE | ORAL | Status: AC

## 2021-01-06 MED ORDER — MIDAZOLAM HCL 2 MG/2ML IJ SOLN
INTRAMUSCULAR | Status: DC | PRN
  Administered 2021-01-06: 1 mg via INTRAVENOUS

## 2021-01-06 MED ORDER — MIDAZOLAM HCL 2 MG/2ML IJ SOLN
INTRAMUSCULAR | Status: AC
  Administered 2021-01-06: 2 mg via INTRAVENOUS
  Filled 2021-01-06: qty 2

## 2021-01-06 MED ORDER — KETOROLAC TROMETHAMINE 30 MG/ML IJ SOLN
30.0000 mg | Freq: Three times a day (TID) | INTRAMUSCULAR | Status: AC
  Administered 2021-01-06 – 2021-01-07 (×3): 30 mg via INTRAVENOUS
  Filled 2021-01-06 (×2): qty 1

## 2021-01-06 SURGICAL SUPPLY — 93 items
ALLOGRAFT PERF 16X20 1.6+/-0.4 (Tissue) ×1 IMPLANT
BAG DECANTER FOR FLEXI CONT (MISCELLANEOUS) ×3 IMPLANT
BINDER BREAST LRG (GAUZE/BANDAGES/DRESSINGS) ×1 IMPLANT
BINDER BREAST XLRG (GAUZE/BANDAGES/DRESSINGS) IMPLANT
BIOPATCH RED 1 DISK 7.0 (GAUZE/BANDAGES/DRESSINGS) ×2 IMPLANT
BNDG COHESIVE 4X5 TAN STRL (GAUZE/BANDAGES/DRESSINGS) ×3 IMPLANT
CANISTER SUCT 3000ML PPV (MISCELLANEOUS) ×6 IMPLANT
CHLORAPREP W/TINT 26 (MISCELLANEOUS) ×6 IMPLANT
CLIP TI MEDIUM 24 (CLIP) ×1 IMPLANT
CLIP TI MEDIUM 6 (CLIP) ×1 IMPLANT
CLIP VESOCCLUDE LG 6/CT (CLIP) ×3 IMPLANT
CLIP VESOCCLUDE MED 6/CT (CLIP) ×3 IMPLANT
CLIP VESOCCLUDE SM WIDE 6/CT (CLIP) ×2 IMPLANT
CNTNR URN SCR LID CUP LEK RST (MISCELLANEOUS) ×2 IMPLANT
CONT SPEC 4OZ STRL OR WHT (MISCELLANEOUS) ×6
COVER PROBE W GEL 5X96 (DRAPES) ×3 IMPLANT
COVER SURGICAL LIGHT HANDLE (MISCELLANEOUS) ×6 IMPLANT
COVER TRANSDUCER ULTRASND GEL (DISPOSABLE) ×1 IMPLANT
DERMABOND ADVANCED (GAUZE/BANDAGES/DRESSINGS) ×1
DERMABOND ADVANCED .7 DNX12 (GAUZE/BANDAGES/DRESSINGS) ×6 IMPLANT
DRAIN CHANNEL 15F RND FF W/TCR (WOUND CARE) IMPLANT
DRAIN CHANNEL 19F RND (DRAIN) ×7 IMPLANT
DRAIN JP 15F RND TROCAR (DRAIN) ×1 IMPLANT
DRAPE HALF SHEET 40X57 (DRAPES) ×6 IMPLANT
DRAPE INCISE IOBAN 66X45 STRL (DRAPES) ×1 IMPLANT
DRAPE ORTHO SPLIT 77X108 STRL (DRAPES) ×2
DRAPE SURG ORHT 6 SPLT 77X108 (DRAPES) IMPLANT
DRAPE TOP ARMCOVERS (MISCELLANEOUS) ×3 IMPLANT
DRAPE U-SHAPE 76X120 STRL (DRAPES) ×3 IMPLANT
DRAPE WARM FLUID 44X44 (DRAPES) ×3 IMPLANT
DRSG PAD ABDOMINAL 8X10 ST (GAUZE/BANDAGES/DRESSINGS) ×6 IMPLANT
DRSG TEGADERM 4X4.75 (GAUZE/BANDAGES/DRESSINGS) ×13 IMPLANT
ELECT BLADE 4.0 EZ CLEAN MEGAD (MISCELLANEOUS) ×3
ELECT CAUTERY BLADE 6.4 (BLADE) ×5 IMPLANT
ELECT COATED BLADE 2.86 ST (ELECTRODE) ×3 IMPLANT
ELECT REM PT RETURN 9FT ADLT (ELECTROSURGICAL) ×6
ELECTRODE BLDE 4.0 EZ CLN MEGD (MISCELLANEOUS) ×2 IMPLANT
ELECTRODE REM PT RTRN 9FT ADLT (ELECTROSURGICAL) ×4 IMPLANT
EVACUATOR SILICONE 100CC (DRAIN) ×7 IMPLANT
EXPANDER TISSUE FORTE 300CC (Breast) IMPLANT
GAUZE SPONGE 4X4 12PLY STRL (GAUZE/BANDAGES/DRESSINGS) ×2 IMPLANT
GLOVE SURG ENC MOIS LTX SZ6 (GLOVE) ×9 IMPLANT
GLOVE SURG UNDER LTX SZ6.5 (GLOVE) ×3 IMPLANT
GOWN STRL REUS W/ TWL LRG LVL3 (GOWN DISPOSABLE) ×6 IMPLANT
GOWN STRL REUS W/TWL 2XL LVL3 (GOWN DISPOSABLE) ×3 IMPLANT
GOWN STRL REUS W/TWL LRG LVL3 (GOWN DISPOSABLE) ×3
KIT BASIN OR (CUSTOM PROCEDURE TRAY) ×6 IMPLANT
KIT TURNOVER KIT B (KITS) ×5 IMPLANT
LIGHT WAVEGUIDE WIDE FLAT (MISCELLANEOUS) ×3 IMPLANT
MARKER SKIN DUAL TIP RULER LAB (MISCELLANEOUS) ×5 IMPLANT
NDL 18GX1X1/2 (RX/OR ONLY) (NEEDLE) IMPLANT
NDL HYPO 25GX1X1/2 BEV (NEEDLE) IMPLANT
NDL SPNL 22GX3.5 QUINCKE BK (NEEDLE) ×2 IMPLANT
NEEDLE 18GX1X1/2 (RX/OR ONLY) (NEEDLE) ×3 IMPLANT
NEEDLE HYPO 25GX1X1/2 BEV (NEEDLE) ×3 IMPLANT
NEEDLE SPNL 22GX3.5 QUINCKE BK (NEEDLE) IMPLANT
NS IRRIG 1000ML POUR BTL (IV SOLUTION) ×7 IMPLANT
PACK GENERAL/GYN (CUSTOM PROCEDURE TRAY) ×5 IMPLANT
PACK UNIVERSAL I (CUSTOM PROCEDURE TRAY) ×3 IMPLANT
PAD ARMBOARD 7.5X6 YLW CONV (MISCELLANEOUS) ×6 IMPLANT
PENCIL SMOKE EVACUATOR (MISCELLANEOUS) ×3 IMPLANT
PIN SAFETY STERILE (MISCELLANEOUS) ×3 IMPLANT
SET ASEPTIC TRANSFER (MISCELLANEOUS) ×3 IMPLANT
SOL PREP POV-IOD 4OZ 10% (MISCELLANEOUS) ×3 IMPLANT
SPECIMEN JAR X LARGE (MISCELLANEOUS) ×3 IMPLANT
SPONGE T-LAP 18X18 ~~LOC~~+RFID (SPONGE) ×3 IMPLANT
STAPLER VISISTAT 35W (STAPLE) ×5 IMPLANT
STOCKINETTE IMPERVIOUS 9X36 MD (GAUZE/BANDAGES/DRESSINGS) ×3 IMPLANT
STRIP CLOSURE SKIN 1/2X4 (GAUZE/BANDAGES/DRESSINGS) ×2 IMPLANT
SUT CHROMIC 4 0 PS 2 18 (SUTURE) ×3 IMPLANT
SUT ETHILON 2 0 FS 18 (SUTURE) ×6 IMPLANT
SUT MNCRL AB 4-0 PS2 18 (SUTURE) ×3 IMPLANT
SUT MON AB 4-0 PC3 18 (SUTURE) ×2 IMPLANT
SUT PDS AB 2-0 CT2 27 (SUTURE) IMPLANT
SUT SILK 2 0 (SUTURE) ×1
SUT SILK 2 0 PERMA HAND 18 BK (SUTURE) ×3 IMPLANT
SUT SILK 2-0 18XBRD TIE 12 (SUTURE) ×2 IMPLANT
SUT VIC AB 0 CT2 27 (SUTURE) ×3 IMPLANT
SUT VIC AB 3-0 SH 27 (SUTURE) ×1
SUT VIC AB 3-0 SH 27X BRD (SUTURE) IMPLANT
SUT VIC AB 3-0 SH 8-18 (SUTURE) ×3 IMPLANT
SUT VIC AB 4-0 PS2 18 (SUTURE) ×1 IMPLANT
SUT VICRYL 4-0 PS2 18IN ABS (SUTURE) IMPLANT
SUT VLOC 180 0 24IN GS25 (SUTURE) ×2 IMPLANT
SYR 50ML LL SCALE MARK (SYRINGE) ×3 IMPLANT
SYR BULB IRRIG 60ML STRL (SYRINGE) ×3 IMPLANT
SYR CONTROL 10ML LL (SYRINGE) ×1 IMPLANT
TISSUE EXPANDER FORTE 300CC (Breast) ×3 IMPLANT
TOWEL GREEN STERILE (TOWEL DISPOSABLE) ×6 IMPLANT
TOWEL GREEN STERILE FF (TOWEL DISPOSABLE) ×6 IMPLANT
TRACER MAGTRACE VIAL (MISCELLANEOUS) ×1 IMPLANT
TRAY FOLEY MTR SLVR 16FR STAT (SET/KITS/TRAYS/PACK) IMPLANT
TUBE CONNECTING 12X1/4 (SUCTIONS) ×3 IMPLANT

## 2021-01-06 NOTE — Anesthesia Procedure Notes (Signed)
Procedure Name: Intubation Date/Time: 01/06/2021 9:19 AM Performed by: Trinna Post., CRNA Pre-anesthesia Checklist: Patient identified, Emergency Drugs available, Suction available, Patient being monitored and Timeout performed Patient Re-evaluated:Patient Re-evaluated prior to induction Oxygen Delivery Method: Circle system utilized Preoxygenation: Pre-oxygenation with 100% oxygen Induction Type: IV induction Ventilation: Mask ventilation without difficulty Laryngoscope Size: Mac and 3 Grade View: Grade I Tube type: Oral Tube size: 7.0 mm Number of attempts: 1 Airway Equipment and Method: Stylet Placement Confirmation: ETT inserted through vocal cords under direct vision, positive ETCO2 and breath sounds checked- equal and bilateral Secured at: 22 cm Tube secured with: Tape Dental Injury: Teeth and Oropharynx as per pre-operative assessment

## 2021-01-06 NOTE — Interval H&P Note (Signed)
History and Physical Interval Note:  01/06/2021 8:13 AM  Pamela Reyes  has presented today for surgery, with the diagnosis of LEFT BREAST CANCER.  The various methods of treatment have been discussed with the patient and family. After consideration of risks, benefits and other options for treatment, the patient has consented to left breast reconstruction with tissue expander acellular dermis as a surgical intervention.  The patient's history has been reviewed, patient examined, no change in status, stable for surgery.  I have reviewed the patient's chart and labs.  Questions were answered to the patient's satisfaction.     Arnoldo Hooker Arnett Galindez

## 2021-01-06 NOTE — Op Note (Signed)
Operative Note   DATE OF OPERATION: 11.30.22  LOCATION: [facility]-observation  SURGICAL DIVISION: Plastic Surgery  PREOPERATIVE DIAGNOSES:  1. Left breast cancer UOQ ER+ metastasized to LN  POSTOPERATIVE DIAGNOSES:  same  PROCEDURE:  1. Left breast reconstruction with tissue expander 2. Acellular dermis (Alloderm) to left chest  SURGEON: Irene Limbo MD MBA  ASSISTANT: none  ANESTHESIA:  General.   EBL: 100 ml for entire procedure  COMPLICATIONS: None immediate.   INDICATIONS FOR PROCEDURE:  The patient, Pamela Reyes, is a 40 y.o. female born on 01-10-1981, is here for immediate reconstruction prepectoral following left mastectomy.    FINDINGS: Natrelle 133S FV-11-T 300 ml tissue expander placed, 0 ml initial fill volume. SN [id number]  DESCRIPTION OF PROCEDURE:  The patient was marked with the patient in the preoperative area to mark sternal notch, chest midline, anterior axillary lines and inframammary folds. Patient was marked for skin reduction mastectomy with most superior portion nipple areola marked on breast meridian. Vertical limbs marked at lateral border areolae and set at 8 cm length. The patient was taken to the operating room. SCDs were placed and IV antibiotics were given. The patient's operative site was prepped and draped in a sterile fashion. A time out was performed and all information was confirmed to be correct. Following completion of mastectomy, in supine position, the lateral limbs for resection marked and area over lower pole preserved as inferiorly based dermal pedicle. Skin de epithelialized in this area.     The cavity was irrigated with saline solution containing Ancef gentamicin and betadine. Hemostasis was ensured. A 19 Fr drain was placed in subcutaneous position laterally and a 15 Fr drain placed along inframammary fold. Each secured to skin with 2-0 nylon. The tissue expander was prepared on back table prior in insertion. The expander was filled  with air. Perforated acellular dermis was  draped over anterior surface expander. The ADM was then secured to itself over posterior surface of expander with 4-0 chromic. Redundant folds acellular dermis excised so that the ADM lay flat without folds over air filled expander. The expander was secured to medial insertion pectoralis with a 0 vicryl. The superior and lateral tabs also secured to pectoralis muscle with 0-vicryl. The ADM was secured to pectoralis muscle and chest wall along inferior border at inframammary fold with 0 V lock suture. Laterally the mastectomy flap over posterior axillary line was advanced anteriorly and the subcutaneous tissue and superficial fascia was secured to chest wall with 0-vicryl. The inferiorly based dermal pedicle was redraped superiorly over expander and acellular dermis and secured to pectoralis with interrupted 0-vicryl. In order to reduce tension on skin closure, all air removed from tissue expander. Skin closure completed with 3-0 vicryl in fascial layer and 4-0 vicryl in dermis. Skin closure completed with 4-0 monocryl subcuticular and tissue adhesive. Tegaderms applied bilateral, followed by dry dressing and breast binder applied.   The patient was allowed to wake from anesthesia, extubated and taken to the recovery room in satisfactory condition.   SPECIMENS: none  DRAINS: 15 and 19 Fr JP in left subcutaneous chest

## 2021-01-06 NOTE — Plan of Care (Signed)
  Problem: Pain Managment: Goal: General experience of comfort will improve Outcome: Progressing   

## 2021-01-06 NOTE — Anesthesia Preprocedure Evaluation (Signed)
Anesthesia Evaluation  Patient identified by MRN, date of birth, ID band Patient awake    Reviewed: Allergy & Precautions, NPO status , Patient's Chart, lab work & pertinent test results  Airway Mallampati: II  TM Distance: >3 FB Neck ROM: Full    Dental  (+) Dental Advisory Given   Pulmonary former smoker,    breath sounds clear to auscultation       Cardiovascular negative cardio ROS   Rhythm:Regular Rate:Normal     Neuro/Psych negative neurological ROS     GI/Hepatic Neg liver ROS, hiatal hernia,   Endo/Other  negative endocrine ROS  Renal/GU negative Renal ROS     Musculoskeletal   Abdominal   Peds  Hematology negative hematology ROS (+)   Anesthesia Other Findings   Reproductive/Obstetrics                             Lab Results  Component Value Date   WBC 5.6 01/04/2021   HGB 14.5 01/04/2021   HCT 43.5 01/04/2021   MCV 91.6 01/04/2021   PLT 246 01/04/2021   Lab Results  Component Value Date   CREATININE 0.82 11/11/2020   BUN 16 11/11/2020   NA 141 11/11/2020   K 3.9 11/11/2020   CL 107 11/11/2020   CO2 23 11/11/2020    Anesthesia Physical Anesthesia Plan  ASA: 2  Anesthesia Plan: General   Post-op Pain Management: Regional block, Tylenol PO (pre-op) and Toradol IV (intra-op)   Induction:   PONV Risk Score and Plan: 3 and Midazolam, Dexamethasone, Ondansetron and Treatment may vary due to age or medical condition  Airway Management Planned: Oral ETT  Additional Equipment: None  Intra-op Plan:   Post-operative Plan: Extubation in OR  Informed Consent: I have reviewed the patients History and Physical, chart, labs and discussed the procedure including the risks, benefits and alternatives for the proposed anesthesia with the patient or authorized representative who has indicated his/her understanding and acceptance.     Dental advisory given  Plan  Discussed with: CRNA  Anesthesia Plan Comments:         Anesthesia Quick Evaluation

## 2021-01-06 NOTE — Interval H&P Note (Signed)
History and Physical Interval Note:  01/06/2021 7:59 AM  Brenya TALINE NASS  has presented today for surgery, with the diagnosis of LEFT BREAST CANCER.  The various methods of treatment have been discussed with the patient and family. After consideration of risks, benefits and other options for treatment, the patient has consented to  Procedure(s): LEFT MASTECTOMY WITH SENTINEL LYMPH NODE BIOPSY (Left) RADIOACTIVE SEED GUIDED SENTINEL LYMPH NODE BIOPSY (Left) BREAST RECONSTRUCTION WITH PLACEMENT OF TISSUE EXPANDER AND FLEX HD (ACELLULAR HYDRATED DERMIS) (Left) as a surgical intervention.  The patient's history has been reviewed, patient examined, no change in status, stable for surgery.  I have reviewed the patient's chart and labs.  Questions were answered to the patient's satisfaction.     Stark Klein

## 2021-01-06 NOTE — Transfer of Care (Signed)
Immediate Anesthesia Transfer of Care Note  Patient: Pamela Reyes  Procedure(s) Performed: LEFT MASTECTOMY WITH MAGTRACE (Left: Breast) RADIOACTIVE SEED GUIDED SENTINEL LYMPH NODE BIOPSY (Left: Axilla) LEFT AXILLARY SENTINEL LYMPH  NODE BIOPSY (Left: Axilla) LEFT BREAST RECONSTRUCTION WITH PLACEMENT OF TISSUE EXPANDER AND ACELLULAR  DERMIS (Left: Breast)  Patient Location: PACU  Anesthesia Type:General and Regional  Level of Consciousness: awake, alert , oriented and drowsy  Airway & Oxygen Therapy: Patient Spontanous Breathing  Post-op Assessment: Report given to RN and Post -op Vital signs reviewed and stable  Post vital signs: Reviewed and stable  Last Vitals:  Vitals Value Taken Time  BP 109/54 01/06/21 1230  Temp    Pulse 91 01/06/21 1232  Resp 16 01/06/21 1232  SpO2 100 % 01/06/21 1232  Vitals shown include unvalidated device data.  Last Pain:  Vitals:   01/06/21 0708  TempSrc:   PainSc: 4          Complications: No notable events documented.

## 2021-01-06 NOTE — Anesthesia Procedure Notes (Signed)
Anesthesia Regional Block: Pectoralis block   Pre-Anesthetic Checklist: , timeout performed,  Correct Patient, Correct Site, Correct Laterality,  Correct Procedure, Correct Position, site marked,  Risks and benefits discussed,  Surgical consent,  Pre-op evaluation,  At surgeon's request and post-op pain management  Laterality: Left  Prep: chloraprep       Needles:  Injection technique: Single-shot  Needle Type: Echogenic Needle     Needle Length: 9cm  Needle Gauge: 21     Additional Needles:   Procedures:,,,, ultrasound used (permanent image in chart),,    Narrative:  Start time: 01/06/2021 8:10 AM End time: 01/06/2021 8:15 AM Injection made incrementally with aspirations every 5 mL.  Performed by: Personally  Anesthesiologist: Suzette Battiest, MD

## 2021-01-06 NOTE — Op Note (Signed)
Left skin sparing Mastectomy with seed targeted deep left axillary lymph node biopsy, Sentinel Node Biopsy  Indications: This patient presents with history of left breast cancer   Pre-operative Diagnosis: left breast cancer, grade 1 invasive ductal carcinoma, cT2N1M0, upper outer quadrant, receptors +/+/-  Post-operative Diagnosis: same  Surgeon: Stark Klein   Anesthesia: General endotracheal anesthesia and pectoral block  ASA Class: 2  Procedure Details  The patient was seen in the Holding Room. The risks, benefits, complications, treatment options, and expected outcomes were discussed with the patient. The possibilities of reaction to medication, pulmonary aspiration, bleeding, infection, the need for additional procedures, failure to diagnose a condition, and creating a complication requiring transfusion or operation were discussed with the patient. The patient concurred with the proposed plan, giving informed consent.  The site of surgery properly noted/marked. The patient was taken to Operating Room # 8, identified as Pamela Reyes and the procedure verified as left skin sparing Mastectomy, left seed localized axillary lymph node biopsy and Sentinel Node Biopsy. A Time Out was held and the above information confirmed.  The MagTrace was injected in the subareolar location.    After induction of anesthesia, the bilateral breast and chest were prepped and draped in standard fashion.  The borders of the breast were identified and marked.  Dr. Iran Planas marked a triangular incision on the breast.   The skin was incised with the #10 blade.  Mastectomy hooks were used to provide elevation of the skin edges, and the cautery was used to create the mastectomy flaps.  The dissection was taken to the fascia of the pectoralis major.   The breast tissue was taken medially to the lateral sternal border, superiorly to the inferior border of the clavicle, inferiorly to the inframammary fold and laterally to  the border of the latissimus.  The breast was taken off including the pectoralis fascia and the axillary tail marked.    Using a hand-held gamma probe, the seed with the node was sought.  There was an immediately visible node that was abnormal appearing.  This was taken with cautery.  It had mild uptake of magtrace and T99.  It did not contain the seed.  The second node had the seed.   Three additional deep level 2 axillary sentinel nodes were removed and submitted to pathology.  The lymphovascular channels were clipped with metal clips as needed.    These were all hot with MagTrace and T99.  Peak T99 signal was 1350, background was 19 in the axilla.  Highest MagTrace signal was in the 4500 range.    The wound was irrigated. Hemostasis was achieved with cautery.    The patient was left with Dr. Iran Planas for expander reconstruction.    Findings: grossly clear surgical margins, seed and biopsy clip confirmed in the node with the [place].  Estimated Blood Loss: 25 mL          Drains: per Dr. Iran Planas                Specimens: left breast and five axillary sentinel nodes, the second of which contained the seed.           Complications:  None; patient tolerated the procedure well.         Disposition: PACU - hemodynamically stable.         Condition: stable

## 2021-01-07 ENCOUNTER — Encounter (HOSPITAL_COMMUNITY): Payer: Self-pay | Admitting: General Surgery

## 2021-01-07 DIAGNOSIS — C50412 Malignant neoplasm of upper-outer quadrant of left female breast: Secondary | ICD-10-CM | POA: Diagnosis not present

## 2021-01-07 MED ORDER — SCOPOLAMINE 1 MG/3DAYS TD PT72
1.0000 | MEDICATED_PATCH | TRANSDERMAL | Status: DC
Start: 2021-01-07 — End: 2021-01-07
  Administered 2021-01-07: 1.5 mg via TRANSDERMAL
  Filled 2021-01-07: qty 1

## 2021-01-07 MED ORDER — PROCHLORPERAZINE EDISYLATE 10 MG/2ML IJ SOLN: 10.0000 mg | INTRAMUSCULAR | Status: DC | PRN

## 2021-01-07 MED ORDER — ONDANSETRON HCL 8 MG PO TABS: 8.0000 mg | ORAL_TABLET | Freq: Three times a day (TID) | ORAL | 0 refills | Status: DC | PRN

## 2021-01-07 NOTE — Anesthesia Postprocedure Evaluation (Signed)
Anesthesia Post Note  Patient: Pamela Reyes  Procedure(s) Performed: LEFT MASTECTOMY WITH MAGTRACE (Left: Breast) RADIOACTIVE SEED GUIDED SENTINEL LYMPH NODE BIOPSY (Left: Axilla) LEFT AXILLARY SENTINEL LYMPH  NODE BIOPSY (Left: Axilla) LEFT BREAST RECONSTRUCTION WITH PLACEMENT OF TISSUE EXPANDER AND ACELLULAR  DERMIS (Left: Breast)     Patient location during evaluation: PACU Anesthesia Type: General and Regional Level of consciousness: sedated and patient cooperative Pain management: pain level controlled Vital Signs Assessment: post-procedure vital signs reviewed and stable Respiratory status: spontaneous breathing Cardiovascular status: stable Anesthetic complications: no   No notable events documented.  Last Vitals:  Vitals:   01/07/21 0442 01/07/21 0817  BP: (!) 104/46 108/62  Pulse: 76 78  Resp:  18  Temp: 36.7 C 36.7 C  SpO2: 100% 100%    Last Pain:  Vitals:   01/07/21 0442  TempSrc: Oral  PainSc:                  Nolon Nations

## 2021-01-07 NOTE — Discharge Summary (Signed)
Physician Discharge Summary  Patient ID: Pamela Reyes MRN: [id number] DOB/AGE: 41-Jan-1982 41 y.o.  Admit date: 01/06/2021 Discharge date: 01/07/2021  Admission Diagnoses:  Left breast cancer  Discharge Diagnoses:  Principal Problem:   Breast cancer, left breast [facility])   Discharged Condition: stable  Hospital Course: Post operatively patient did well with pain controlled. She experienced nausea without emesis and tolerated diet. Nausea medication added for home use. Patient was ambulatory with minimal assist. Instructed on drain care and bathing.  Treatments: surgery: left mastectomy sentinel node left breast reconstruction with tissue expander acellular dermis 11.30.22  Discharge Exam: Blood pressure (!) 104/46, pulse 76, temperature 98.1 F (36.7 C), temperature source Oral, resp. rate 15, height 5\' 4"  (1.626 m), weight 58.4 kg, last menstrual period 12/10/2020, SpO2 100 %. Incision/Wound: chest flat incisions intact dry Tegaderms in place drains serosanguinous  Disposition: Discharge disposition: 01-Home or Self Care       Discharge Instructions     Call MD for:  redness, tenderness, or signs of infection (pain, swelling, bleeding, redness, odor or green/yellow discharge around incision site)   Complete by: As directed    Call MD for:  temperature >100.5   Complete by: As directed    Discharge instructions   Complete by: As directed    Ok to remove dressings and shower am 12.2.22. Soap and water ok, pat Tegaderms dry. Do not remove Tegaderms. No creams or ointments over incisions. Do not let drains dangle in shower, attach to lanyard or similar.Strip and record drains twice daily and bring log to clinic visit.  Breast binder or soft compression bra all other times.  Ok to raise arms above shoulders for bathing and dressing.  No house yard work or exercise until cleared by MD.   Recommend ibuprofen with meals to aid with pain control. Also ok to use Tylenol for pain.  Recommend Miralax or Dulcolax as needed for constipation. Patient received all other Rx preop except Zofran.   Driving Restrictions   Complete by: As directed    No driving for 2 weeks then no driving if taking prescription pain medication   Lifting restrictions   Complete by: As directed    No lifting > 5-10 lbs until cleared by mD   Resume previous diet   Complete by: As directed         Follow-up Information     Stark Klein, MD Follow up in 2 week(s).   Specialty: General Surgery Contact information: [place] [address] [phone]         Irene Limbo, MD Follow up in 1 week(s).   Specialty: Plastic Surgery Why: as scheduled Contact information: [address] [address] [place] [address] [phone]                 Signed: Irene Limbo 01/07/2021, 7:56 AM

## 2021-01-07 NOTE — Progress Notes (Signed)
Pt getting dressed. Breast Cancer bag already given. Pt understands JP drain care.

## 2021-01-08 ENCOUNTER — Telehealth: Payer: Self-pay | Admitting: General Surgery

## 2021-01-08 LAB — SURGICAL PATHOLOGY

## 2021-01-08 NOTE — Telephone Encounter (Signed)
Discussed path with patient. Will review with treatment team.  May schedule ALND.  Will want decision on chemo to see if port will be placed at the same time.

## 2021-01-12 ENCOUNTER — Encounter: Payer: Self-pay | Admitting: *Deleted

## 2021-01-12 ENCOUNTER — Telehealth: Payer: Self-pay | Admitting: Hematology

## 2021-01-12 NOTE — Telephone Encounter (Signed)
Scheduled per sch msg. Called and left msg  

## 2021-01-13 ENCOUNTER — Encounter: Payer: Self-pay | Admitting: Hematology

## 2021-01-13 ENCOUNTER — Telehealth: Payer: Self-pay | Admitting: *Deleted

## 2021-01-13 ENCOUNTER — Inpatient Hospital Stay: Payer: BC Managed Care – PPO | Attending: Hematology | Admitting: Hematology

## 2021-01-13 ENCOUNTER — Other Ambulatory Visit: Payer: Self-pay

## 2021-01-13 ENCOUNTER — Other Ambulatory Visit: Payer: Self-pay | Admitting: *Deleted

## 2021-01-13 ENCOUNTER — Telehealth: Payer: Self-pay | Admitting: Hematology

## 2021-01-13 VITALS — BP 119/68 | HR 106 | Resp 17 | Wt 129.6 lb

## 2021-01-13 DIAGNOSIS — Z803 Family history of malignant neoplasm of breast: Secondary | ICD-10-CM | POA: Insufficient documentation

## 2021-01-13 DIAGNOSIS — F419 Anxiety disorder, unspecified: Secondary | ICD-10-CM | POA: Diagnosis not present

## 2021-01-13 DIAGNOSIS — C50412 Malignant neoplasm of upper-outer quadrant of left female breast: Secondary | ICD-10-CM | POA: Diagnosis not present

## 2021-01-13 DIAGNOSIS — Z170 Estrogen receptor positive status [ER+]: Secondary | ICD-10-CM | POA: Insufficient documentation

## 2021-01-13 DIAGNOSIS — C779 Secondary and unspecified malignant neoplasm of lymph node, unspecified: Secondary | ICD-10-CM | POA: Diagnosis not present

## 2021-01-13 MED ORDER — PROCHLORPERAZINE MALEATE 10 MG PO TABS: 10.0000 mg | ORAL_TABLET | Freq: Four times a day (QID) | ORAL | 1 refills | Status: DC | PRN

## 2021-01-13 MED ORDER — LIDOCAINE-PRILOCAINE 2.5-2.5 % EX CREA: TOPICAL_CREAM | CUTANEOUS | 3 refills | Status: DC

## 2021-01-13 MED ORDER — DEXAMETHASONE 4 MG PO TABS: 4.0000 mg | ORAL_TABLET | Freq: Every day | ORAL | 0 refills | Status: DC

## 2021-01-13 MED ORDER — LORAZEPAM 0.5 MG PO TABS: 0.5000 mg | ORAL_TABLET | Freq: Four times a day (QID) | ORAL | 0 refills | Status: DC | PRN

## 2021-01-13 MED ORDER — ONDANSETRON HCL 8 MG PO TABS: 8.0000 mg | ORAL_TABLET | Freq: Two times a day (BID) | ORAL | 1 refills | Status: DC | PRN

## 2021-01-13 NOTE — Addendum Note (Signed)
Addended by: [name] on: 01/13/2021 11:17 AM   Modules accepted: Orders

## 2021-01-13 NOTE — Telephone Encounter (Signed)
Spoke to patient to confirm upcoming Echo and chemo education appointment made for her, she understands location and time for each appointment

## 2021-01-13 NOTE — Telephone Encounter (Signed)
Spoke with patient to see if she could come in today at 9am instead of 10:40am per Dr. Burr Medico.  Patient agreed. Appt changed to 9am 12/7.

## 2021-01-13 NOTE — Progress Notes (Signed)
[facility]   Telephone:([phone] Fax:([phone]) [phone]   Clinic Follow up Note   Patient Care Team: Maximiano Coss, NP as PCP - General (Adult Health Nurse Practitioner) Mauro Kaufmann, RN as Oncology Nurse Navigator Rockwell Germany, RN as Oncology Nurse Navigator Stark Klein, MD as Consulting Physician (General Surgery) [name], MD as Consulting Physician (Hematology) Kyung Rudd, MD as Consulting Physician (Radiation Oncology) Estill Dooms, NP as Nurse Practitioner (Obstetrics and Gynecology)  Date of Service:  01/13/2021  CHIEF COMPLAINT: f/u of left breast cancer  CURRENT THERAPY:  Pending adjuvant chemo AC-T   ASSESSMENT & PLAN:  Pamela Reyes is a 40 y.o. female with   1. 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 The mass has grown significantly since 2017. Biopsy on 11/05/20 confirmed invasive ductal carcinoma, with metastatic involvement of one lymph node. -staging CT CAP and bone scan on 11/19/20 showed no distant metastasis. -she underwent left mastectomy on 01/06/21 under Dr. Barry Dienes. Pathology showed 6.5 cm invasive ductal carcinoma, grade 2, involving dermis of nipple without epidermal involvement. Margins were negative, but all 4 lymph nodes showed metastatic disease. -I reviewed the pathology results and pathology results with her today, due to the large tumor and 4 positive lymph nodes, she is at high risk for recurrence.  I recommend adjuvant chemotherapy with dose dense Adriamycin and Cytoxan every 3 weeks for 4 cycles, followed by weekly Taxol or Abraxane for 12 weeks. --Chemotherapy consent: Side effects including but does not not limited to, fatigue, nausea, vomiting, diarrhea, hair loss, neuropathy, fluid retention, renal and kidney dysfunction, neutropenic  fever, needed for blood transfusion, bleeding, small risk of cardiomyopathy, leukemia and MDS, were discussed with patient in great detail. She agrees to proceed. -She may need additional surgery for axillary lymph node dissection, I will discuss with her surgeon Dr. Barry Dienes.  I recommend port placement before chemo. -Plan to start adjuvant chemo when she heals from second surgery  -I discussed the usage of DigniCap to prevent hair loss, I gave her handouts -She does not plan to have children, we discussed chemo-induced infertility. -We will check a pregnancy test before each chemo -I also discussed antiestrogen therapy with ovarian suppression and aromatase inhibitor.  She is interested.   2. Genetics -given her young age and strong family history of breast cancer, we recommend genetic testing. -She reports her sister, who was diagnosed at age 74 and has since recovered, had negative genetic testing. -results were negative.   3. Anxiety and stress -We will reach out to our social worker and chaplain for support and counseling -She denies depression, and does not feel she needs medicine at this point.   PLAN:  -I reviewed her surgical pathology, and recommend adjuvant chemotherapy AC-T -I will discuss with her breast surgeon Dr. Barry Dienes to see if she needs axillary lymph node dissection, port placement before chemo -Plan to start adjuvant chemotherapy when she heals from surgery.   No problem-specific Assessment & Plan notes found for this encounter.   SUMMARY OF ONCOLOGIC HISTORY: Oncology History Overview Note   Cancer Staging  Malignant neoplasm of upper-outer quadrant of left breast in female, estrogen receptor positive ([place]) Staging form: Breast, AJCC 8th Edition - Clinical stage from 11/05/2020: Stage IIA (cT2, cN1, cM0, G1, ER+, PR+, HER2: Equivocal) - Signed by [name], MD on 11/10/2020 -  Pathologic stage from 01/06/2021: Stage IB (pT3, pN2a, cM0, G2, ER+, PR+, HER2-) - Signed  by [name], MD on 01/12/2021     Malignant neoplasm of upper-outer quadrant of left breast in female, estrogen receptor positive ([place])  10/28/2020 Mammogram   Bilateral Diagnostic Mammogram; Left Breast Ultrasound  IMPRESSION: 1. Suspicious palpable mass/hypoechoic area in the left breast at 12 o'clock retroareolar measuring 4.5 cm.   2.  Suspicious lymph node in the left axilla.   11/05/2020 Cancer Staging   Staging form: Breast, AJCC 8th Edition - Clinical stage from 11/05/2020: Stage IIA (cT2, cN1, cM0, G1, ER+, PR+, HER2: Equivocal) - Signed by [name], MD on 11/10/2020 Stage prefix: Initial diagnosis Histologic grading system: 3 grade system    11/05/2020 Pathology Results   Diagnosis 1. Breast, left, needle core biopsy, 12 o'clock subareolar, ribbon clip - INVASIVE MAMMARY CARCINOMA. SEE NOTE 1. Carcinoma measures 1.5 cm in greatest linear dimension and appears grade 2. 1. Immunohistochemical stain for E-cadherin is positive in the tumor cells, consistent with a ductal phenotype. 1. PROGNOSTIC INDICATORS Results: The tumor cells are EQUIVOCAL for Her2 (2+). Her2 by FISH will be performed and results reported separately. Estrogen Receptor: 70%, POSITIVE, STRONG-MODERATE STAINING INTENSITY Progesterone Receptor: 80%, POSITIVE, MODERATE STAINING INTENSITY Proliferation Marker Ki67: 1%  2. Lymph node, needle/core biopsy, left axilla, tribell clip - METASTATIC CARCINOMA TO A LYMPH NODE. SEE NOTE 2. Largest contiguous focus of metastatic carcinoma measures 0.3 cm. 2. PROGNOSTIC INDICATORS Results: The tumor cells are EQUIVOCAL for Her2 (2+). Her2 by FISH will be performed and results reported separately. Estrogen Receptor: 90%, POSITIVE, STRONG STAINING INTENSITY Progesterone Receptor: 95%, POSITIVE, STRONG STAINING INTENSITY Proliferation Marker Ki67: 1%   11/10/2020 Initial Diagnosis   Malignant neoplasm of upper-outer quadrant of left breast in female, estrogen receptor  positive ([place])   11/24/2020 Genetic Testing   Negative hereditary cancer genetic testing: no pathogenic variants detected in [facility] BRCAPlus STAT Panel or [facility] CustomNext-Cancer +RNAinsight Panel.  The report dates are November 24, 2020 and November 27, 2020, respectively.   The BRCAplus panel offered by [employer] and includes sequencing and deletion/duplication analysis for the following 8 genes: ATM, BRCA1, BRCA2, CDH1, CHEK2, PALB2, PTEN, and TP53.  The CustomNext-Cancer+RNAinsight panel offered by [facility] includes sequencing and rearrangement analysis for the following 47 genes:  APC, ATM, AXIN2, BARD1, BMPR1A, BRCA1, BRCA2, BRIP1, CDH1, CDK4, CDKN2A, CHEK2, DICER1, EPCAM, GREM1, HOXB13, MEN1, MLH1, MSH2, MSH3, MSH6, MUTYH, NBN, NF1, NF2, NTHL1, PALB2, PMS2, POLD1, POLE, PTEN, RAD51C, RAD51D, RECQL, RET, SDHA, SDHAF2, SDHB, SDHC, SDHD, SMAD4, SMARCA4, STK11, TP53, TSC1, TSC2, and VHL.  RNA data is routinely analyzed for use in variant interpretation for all genes.    01/06/2021 Cancer Staging   Staging form: Breast, AJCC 8th Edition - Pathologic stage from 01/06/2021: Stage IB (pT3, pN2a, cM0, G2, ER+, PR+, HER2-) - Signed by [name], MD on 01/12/2021 Stage prefix: Initial diagnosis Histologic grading system: 3 grade system Residual tumor (R): R0 - None    01/27/2021 -  Chemotherapy   Patient is on Treatment Plan : BREAST ADJUVANT DOSE DENSE AC q14d / PACLitaxel q7d        INTERVAL HISTORY:  Pamela Reyes is here for a follow up of after surgery.  She presents to clinic alone.  She underwent left mastectomy and targeted and sentinel lymph node biopsy last week.  She tolerated surgery well, is still recovering.  She still has 2 drain tube in.  She takes NSAIDs for  her pain.  She also noticed intermittent right axillary pain for the past few months, she is concerned if this is related to her breast cancer.   All other systems were reviewed with the patient and are  negative.  MEDICAL HISTORY:  Past Medical History:  Diagnosis Date   Anxiety    Breast cancer ([place])    Family history of breast cancer 11/11/2020   Fibroid 10/27/2020   Has 1 x .9 x .6 cm subserosal fibroid   History of hiatal hernia     SURGICAL HISTORY: Past Surgical History:  Procedure Laterality Date   BREAST RECONSTRUCTION WITH PLACEMENT OF TISSUE EXPANDER AND FLEX HD (ACELLULAR HYDRATED DERMIS) Left 01/06/2021   Procedure: LEFT BREAST RECONSTRUCTION WITH PLACEMENT OF TISSUE EXPANDER AND ACELLULAR  DERMIS;  Surgeon: Irene Limbo, MD;  Location: [facility];  Service: Plastics;  Laterality: Left;   MASTECTOMY W/ SENTINEL NODE BIOPSY Left 01/06/2021   Procedure: LEFT MASTECTOMY WITH MAGTRACE;  Surgeon: Stark Klein, MD;  Location: [facility];  Service: General;  Laterality: Left;   RADIOACTIVE SEED GUIDED AXILLARY SENTINEL LYMPH NODE Left 01/06/2021   Procedure: RADIOACTIVE SEED GUIDED SENTINEL LYMPH NODE BIOPSY;  Surgeon: Stark Klein, MD;  Location: [facility];  Service: General;  Laterality: Left;   SENTINEL NODE BIOPSY Left 01/06/2021   Procedure: LEFT AXILLARY SENTINEL LYMPH  NODE BIOPSY;  Surgeon: Stark Klein, MD;  Location: [facility];  Service: General;  Laterality: Left;   WISDOM TOOTH EXTRACTION      I have reviewed the social history and family history with the patient and they are unchanged from previous note.  ALLERGIES:  has No Known Allergies.  MEDICATIONS:  Current Outpatient Medications  Medication Sig Dispense Refill   acetaminophen (TYLENOL) 500 MG tablet Take 500-1,000 mg by mouth every 6 (six) hours as needed for moderate pain or headache.     Cholecalciferol (DIALYVITE VITAMIN D 5000) 125 MCG (5000 UT) capsule Take 5,000 Units by mouth daily.     dexamethasone (DECADRON) 4 MG tablet Take 1 tablet (4 mg total) by mouth daily. Take daily for 3 days after chemo. Take with food. 20 tablet 0   diphenhydrAMINE (BENADRYL) 25 MG tablet Take 25 mg by mouth at bedtime as  needed for sleep.     famotidine (PEPCID) 10 MG tablet Take 10 mg by mouth daily as needed for heartburn or indigestion.     lidocaine-prilocaine (EMLA) cream Apply to affected area once 30 g 3   LORazepam (ATIVAN) 0.5 MG tablet Take 1 tablet (0.5 mg total) by mouth every 6 (six) hours as needed (Nausea or vomiting). 20 tablet 0   Menaquinone-7 (K2 PO) Take 90 mcg by mouth daily.     methocarbamol (ROBAXIN) 500 MG tablet Take 500 mg by mouth 3 (three) times daily as needed for muscle spasms.     ondansetron (ZOFRAN) 8 MG tablet Take 1 tablet (8 mg total) by mouth every 8 (eight) hours as needed for nausea or vomiting. 10 tablet 0   ondansetron (ZOFRAN) 8 MG tablet Take 1 tablet (8 mg total) by mouth 2 (two) times daily as needed. Start on the third day after chemotherapy. 30 tablet 1   oxyCODONE (OXY IR/ROXICODONE) 5 MG immediate release tablet Take 5 mg by mouth every 4 (four) hours as needed for pain.     prochlorperazine (COMPAZINE) 10 MG tablet Take 1 tablet (10 mg total) by mouth every 6 (six) hours as needed (Nausea or vomiting). 30 tablet 1  sulfamethoxazole-trimethoprim (BACTRIM DS) 800-160 MG tablet Take 1 tablet by mouth 2 (two) times daily.     tretinoin (RETIN-A) 0.025 % cream Apply 1 application topically at bedtime.     vitamin C (ASCORBIC ACID) 500 MG tablet Take 500 mg by mouth daily.     No current facility-administered medications for this visit.    PHYSICAL EXAMINATION: ECOG PERFORMANCE STATUS: 1 - Symptomatic but completely ambulatory  Vitals:   01/13/21 0919  BP: 119/68  Pulse: (!) 106  Resp: 17  SpO2: 99%   Wt Readings from Last 3 Encounters:  01/13/21 129 lb 9 oz (58.8 kg)  01/06/21 128 lb 12 oz (58.4 kg)  01/04/21 128 lb 12.8 oz (58.4 kg)     GENERAL:alert, no distress and comfortable SKIN: skin color, texture, turgor are normal, no rashes or significant lesions EYES: normal, Conjunctiva are pink and non-injected, sclera clear NECK: supple, thyroid normal  size, non-tender, without nodularity LYMPH:  no palpable lymphadenopathy in the cervical, axillary  LUNGS: clear to auscultation and percussion with normal breathing effort HEART: regular rate & rhythm and no murmurs and no lower extremity edema ABDOMEN:abdomen soft, non-tender and normal bowel sounds Musculoskeletal:no cyanosis of digits and no clubbing  NEURO: alert & oriented x 3 with fluent speech, no focal motor/sensory deficits Breasts: Breast inspection showed status post left mastectomy and tissue expander placement, surgical incisions are healing well, 2 drains are still in, no skin discharge.  Palpation of the right breast and axilla revealed no obvious mass that I could appreciate.   LABORATORY DATA:  I have reviewed the data as listed CBC Latest Ref Rng & Units 01/04/2021 11/11/2020 10/23/2020  WBC 4.0 - 10.5 K/uL 5.6 4.5 5.2  Hemoglobin 12.0 - 15.0 g/dL 14.5 14.5 14.0  Hematocrit 36.0 - 46.0 % 43.5 42.4 42.2  Platelets 150 - 400 K/uL 246 238 291.0     CMP Latest Ref Rng & Units 11/11/2020 10/23/2020  Glucose 70 - 99 mg/dL 124(H) 86  BUN 6 - 20 mg/dL 16 16  Creatinine 0.44 - 1.00 mg/dL 0.82 0.72  Sodium 135 - 145 mmol/L 141 138  Potassium 3.5 - 5.1 mmol/L 3.9 4.3  Chloride 98 - 111 mmol/L 107 103  CO2 22 - 32 mmol/L 23 22  Calcium 8.9 - 10.3 mg/dL 9.3 9.6  Total Protein 6.5 - 8.1 g/dL 7.3 7.2  Total Bilirubin 0.3 - 1.2 mg/dL 0.4 0.4  Alkaline Phos 38 - 126 U/L 26(L) 24(L)  AST 15 - 41 U/L 13(L) 10  ALT 0 - 44 U/L 12 9      RADIOGRAPHIC STUDIES: I have personally reviewed the radiological images as listed and agreed with the findings in the report. No results found.    Orders Placed This Encounter  Procedures   CBC with Differential ([facility] Only)    Standing Status:   Standing    Number of Occurrences:   20    Standing Expiration Date:   01/13/2022   CMP ([facility] only)    Standing Status:   Standing    Number of Occurrences:   20    Standing  Expiration Date:   01/13/2022   All questions were answered. The patient knows to call the clinic with any problems, questions or concerns. No barriers to learning was detected. The total time spent in the appointment was 40 minutes.     [name], MD 01/13/2021   I, Wilburn Mylar, am acting as scribe for [name], MD.  I have reviewed the above documentation for accuracy and completeness, and I agree with the above.

## 2021-01-13 NOTE — Progress Notes (Signed)
START ON PATHWAY REGIMEN - Breast     Cycles 1 through 4: A cycle is every 14 days:     Doxorubicin      Cyclophosphamide      [id number]    Cycles 5 through 16: A cycle is every 7 days:     Paclitaxel   **Always confirm dose/schedule in your pharmacy ordering system**  Patient Characteristics: Postoperative without Neoadjuvant Therapy (Pathologic Staging), Invasive Disease, Adjuvant Therapy, HER2 Negative/Unknown/Equivocal, ER Positive, Node Positive, Node Positive (4+) Therapeutic Status: Postoperative without Neoadjuvant Therapy (Pathologic Staging) AJCC Grade: G2 AJCC N Category: pN2a AJCC M Category: cM0 ER Status: Positive (+) AJCC 8 Stage Grouping: IB HER2 Status: Negative (-) Oncotype Dx Recurrence Score: Not Appropriate AJCC T Category: pT2 PR Status: Positive (+) Adjuvant Therapy Status: No Adjuvant Therapy Received Yet or Changing Initial Adjuvant Regimen due to Tolerance Intent of Therapy: Curative Intent, Discussed with Patient

## 2021-01-13 NOTE — Progress Notes (Addendum)
CLINICAL TRIAL SELECTED - Breast  Trial: Treatment of Refractory Nausea  **Trial eligibility and accrual should be confirmed by your research team**  Patient Characteristics: Postoperative without Neoadjuvant Therapy (Pathologic Staging), Invasive Disease, Adjuvant Therapy, HER2 Negative/Unknown/Equivocal, ER Positive, Node Positive, Node Positive (4+) Therapeutic Status: Postoperative without Neoadjuvant Therapy (Pathologic Staging) AJCC Grade: G2 AJCC N Category: pN2a AJCC M Category: cM0 ER Status: Positive (+) AJCC 8 Stage Grouping: IB HER2 Status: Negative (-) Oncotype Dx Recurrence Score: Not Appropriate AJCC T Category: pT3 PR Status: Positive (+) Adjuvant Therapy Status: No Adjuvant Therapy Received Yet or Changing Initial Adjuvant Regimen due to Tolerance Intent of Therapy: Curative Intent, Discussed with Patient

## 2021-01-14 ENCOUNTER — Telehealth: Payer: Self-pay

## 2021-01-14 ENCOUNTER — Encounter: Payer: Self-pay | Admitting: *Deleted

## 2021-01-14 ENCOUNTER — Encounter: Payer: Self-pay | Admitting: General Practice

## 2021-01-14 NOTE — Telephone Encounter (Signed)
Trial:  Refractory Nausea [place], [place] [address] Patient Pamela Reyes was identified by Dr Burr Medico as a potential candidate for the above listed study.  This Clinical Research Nurse called LEVA BAINE, [id number] to discuss participation in the above listed research study.  A copy of the informed consent document and separate HIPAA Authorization were emailed to the patient at her request.  Patient reads, speaks, and understands [place]. Patient was provided with the contact information of this Nurse and encouraged to contact the research team with any questions.  Approximately 15 minutes were spent on the phone with the patient answering initial questions.   Confirmed that Dr Burr Medico discussed the need for appropriate contraception during the study. Plan made with patient to follow up via phone on Monday 01/18/21 around 1000. [name] was encouraged to call before then if she has questions/concerns.  Marjie Skiff Shamyah Stantz, RN, BSN, [facility]  Her  [facility] Clinical Research Nurse [place] [phone]  Pager [phone] 01/14/2021 10:25 AM

## 2021-01-14 NOTE — Progress Notes (Signed)
[facility] Work  Initial Assessment   Pamela Reyes is a 40 y.o. year old female contacted by phone. Clinical Social Work was referred by medical oncologist for assessment of psychosocial needs.   SDOH (Social Determinants of Health) assessments performed: Yes   Distress Screen completed: No ONCBCN DISTRESS SCREENING 11/11/2020  Screening Type Initial Screening  Distress experienced in past week (1-10) 7  Emotional problem type Nervousness/Anxiety;Adjusting to illness      Family/Social Information:  Housing Arrangement: patient lives with partner Family members/support persons in your life? Family and Friends/Colleagues Transportation concerns: no  Employment: Working full time. Income source: Employment Financial concerns: No Type of concern: None.partner can support her while she is unable to work Food access concerns: no Religious or spiritual practice: no Medication Concerns: no  Services Currently in place:  none  Coping/ Adjustment to diagnosis: Patient understands treatment plan and what happens next? yes, recently diagnosed with Stage 1 IDC, has had mastectomy.  She was shocked to find out that she now needs additional surgery in order to remove lymph nodes.  Chemotherapy is being delayed by additional surgery and recovery times.  Discovered after surveillance of preexisting fibroadenoma/cyst.  Annual mammogram revealed issue of concern.  Cancer diagnosis is not unexpected - sister is a cancer survivor and "it is genetic."  However, she is not welcoming the inevitable life disruption that comes w cancer treatment and feels she has done everything she can to keep herself healthy and well.   Concerns about diagnosis and/or treatment:  concerned about getting through chemotherapy, recovery from second surgery, does not know what to anticipate during treatment Patient reported stressors: Adjusting to my illness Hopes and priorities: getting back to normal activities  and work Patient enjoys being outside, bird watching, depending on family Current coping skills/ strengths: Ability for insight , Average or above average intelligence , Capable of independent living , Communication skills , Financial means , Special hobby/interest , and Supportive family/friends     SUMMARY:  Interventions: Discussed common feeling and emotions when being diagnosed with cancer, and the importance of support during treatment Informed patient of the support team roles and support services at [facility] Provided [facility] contact information and encouraged patient to call with any questions or concerns Referred patient to [place], declined referral for [place] guide at this time.   Follow Up Plan: Patient will contact CSW with any support or resource needs Patient verbalizes understanding of plan: Yes    Beverely Pace , [place], LCSW Clinical Social Worker Phone:  [phone]

## 2021-01-15 ENCOUNTER — Other Ambulatory Visit: Payer: Self-pay | Admitting: General Surgery

## 2021-01-18 ENCOUNTER — Other Ambulatory Visit: Payer: Self-pay | Admitting: Adult Health

## 2021-01-18 ENCOUNTER — Telehealth: Payer: Self-pay

## 2021-01-18 ENCOUNTER — Other Ambulatory Visit: Payer: Self-pay | Admitting: General Surgery

## 2021-01-18 DIAGNOSIS — M79621 Pain in right upper arm: Secondary | ICD-10-CM | POA: Insufficient documentation

## 2021-01-18 HISTORY — DX: Pain in right upper arm: M79.621

## 2021-01-18 NOTE — Telephone Encounter (Signed)
[id number] - TREATMENT OF REFRACTORY NAUSEA   Called patient to follow up on our previous discussion regarding study participation. She is interested in enrolling on the study. Chemo Ed is scheduled 02/04/21; plan to meet with her then to sign consents and give her baseline questionnaires. Confirmed she has my information in case she needs to contact me before then.  Marjie Skiff Shanyiah Conde, RN, BSN, [facility] Clinical Research Nurse [facility] Direct Dial [phone]  Pager [phone] 01/18/2021 10:06 AM

## 2021-01-18 NOTE — Progress Notes (Signed)
DUE TO COVID-19 ONLY ONE VISITOR IS ALLOWED TO COME WITH YOU AND STAY IN THE WAITING ROOM ONLY DURING PRE OP AND PROCEDURE DAY OF SURGERY.   PCP - Maximiano Coss, NP Cardiologist - n/a Oncology - Dr Burr Medico  CT Chest x-ray - n/a EKG - 11/19/20 Stress Test - n/a ECHO - n/a Cardiac Cath - n/a  ICD Pacemaker/Loop - n/a  Sleep Study -  n/a CPAP - none  ERAS: Clear liquids til 6:30 AM DOS  STOP now taking any Aspirin (unless otherwise instructed by your surgeon), Aleve, Naproxen, Ibuprofen, Motrin, Advil, Goody's, BC's, all herbal medications, fish oil, and all vitamins.   Coronavirus Screening Covid test n/a Ambulatory Surgery

## 2021-01-19 ENCOUNTER — Encounter (HOSPITAL_COMMUNITY)
Admission: RE | Disposition: A | Discharge status: Home / Self Care | Payer: Self-pay | Source: Ambulatory Visit | Attending: General Surgery

## 2021-01-19 ENCOUNTER — Encounter (HOSPITAL_COMMUNITY): Payer: Self-pay | Admitting: General Surgery

## 2021-01-19 ENCOUNTER — Ambulatory Visit (HOSPITAL_COMMUNITY): Payer: BC Managed Care – PPO | Admitting: Anesthesiology

## 2021-01-19 ENCOUNTER — Encounter: Payer: Self-pay | Admitting: *Deleted

## 2021-01-19 ENCOUNTER — Ambulatory Visit (HOSPITAL_COMMUNITY)
Admission: RE | Admit: 2021-01-19 | Discharge: 2021-01-19 | Disposition: A | Discharge status: Home / Self Care | Payer: BC Managed Care – PPO | Source: Ambulatory Visit | Attending: General Surgery | Admitting: General Surgery

## 2021-01-19 ENCOUNTER — Telehealth: Payer: Self-pay | Admitting: *Deleted

## 2021-01-19 ENCOUNTER — Other Ambulatory Visit: Payer: Self-pay

## 2021-01-19 ENCOUNTER — Ambulatory Visit (HOSPITAL_COMMUNITY): Payer: BC Managed Care – PPO

## 2021-01-19 DIAGNOSIS — C773 Secondary and unspecified malignant neoplasm of axilla and upper limb lymph nodes: Secondary | ICD-10-CM | POA: Diagnosis not present

## 2021-01-19 DIAGNOSIS — C50412 Malignant neoplasm of upper-outer quadrant of left female breast: Secondary | ICD-10-CM | POA: Insufficient documentation

## 2021-01-19 DIAGNOSIS — M79621 Pain in right upper arm: Secondary | ICD-10-CM

## 2021-01-19 DIAGNOSIS — K219 Gastro-esophageal reflux disease without esophagitis: Secondary | ICD-10-CM | POA: Diagnosis not present

## 2021-01-19 DIAGNOSIS — K449 Diaphragmatic hernia without obstruction or gangrene: Secondary | ICD-10-CM | POA: Diagnosis not present

## 2021-01-19 DIAGNOSIS — F419 Anxiety disorder, unspecified: Secondary | ICD-10-CM | POA: Insufficient documentation

## 2021-01-19 DIAGNOSIS — Z419 Encounter for procedure for purposes other than remedying health state, unspecified: Secondary | ICD-10-CM

## 2021-01-19 DIAGNOSIS — Z95828 Presence of other vascular implants and grafts: Secondary | ICD-10-CM

## 2021-01-19 DIAGNOSIS — Z87891 Personal history of nicotine dependence: Secondary | ICD-10-CM | POA: Insufficient documentation

## 2021-01-19 DIAGNOSIS — N644 Mastodynia: Secondary | ICD-10-CM

## 2021-01-19 DIAGNOSIS — Z170 Estrogen receptor positive status [ER+]: Secondary | ICD-10-CM | POA: Diagnosis not present

## 2021-01-19 HISTORY — PX: PORTACATH PLACEMENT: SHX2246

## 2021-01-19 LAB — POCT PREGNANCY, URINE: Preg Test, Ur: NEGATIVE

## 2021-01-19 IMAGING — DX DG CHEST 1V PORT
1 series · 1 of 1 positions shown · non-contrast
Comparison: CT of the chest on [DATE]

CLINICAL DATA: Status post Port-A-Cath insertion.

EXAM:
PORTABLE CHEST 1 VIEW

[chest ap]
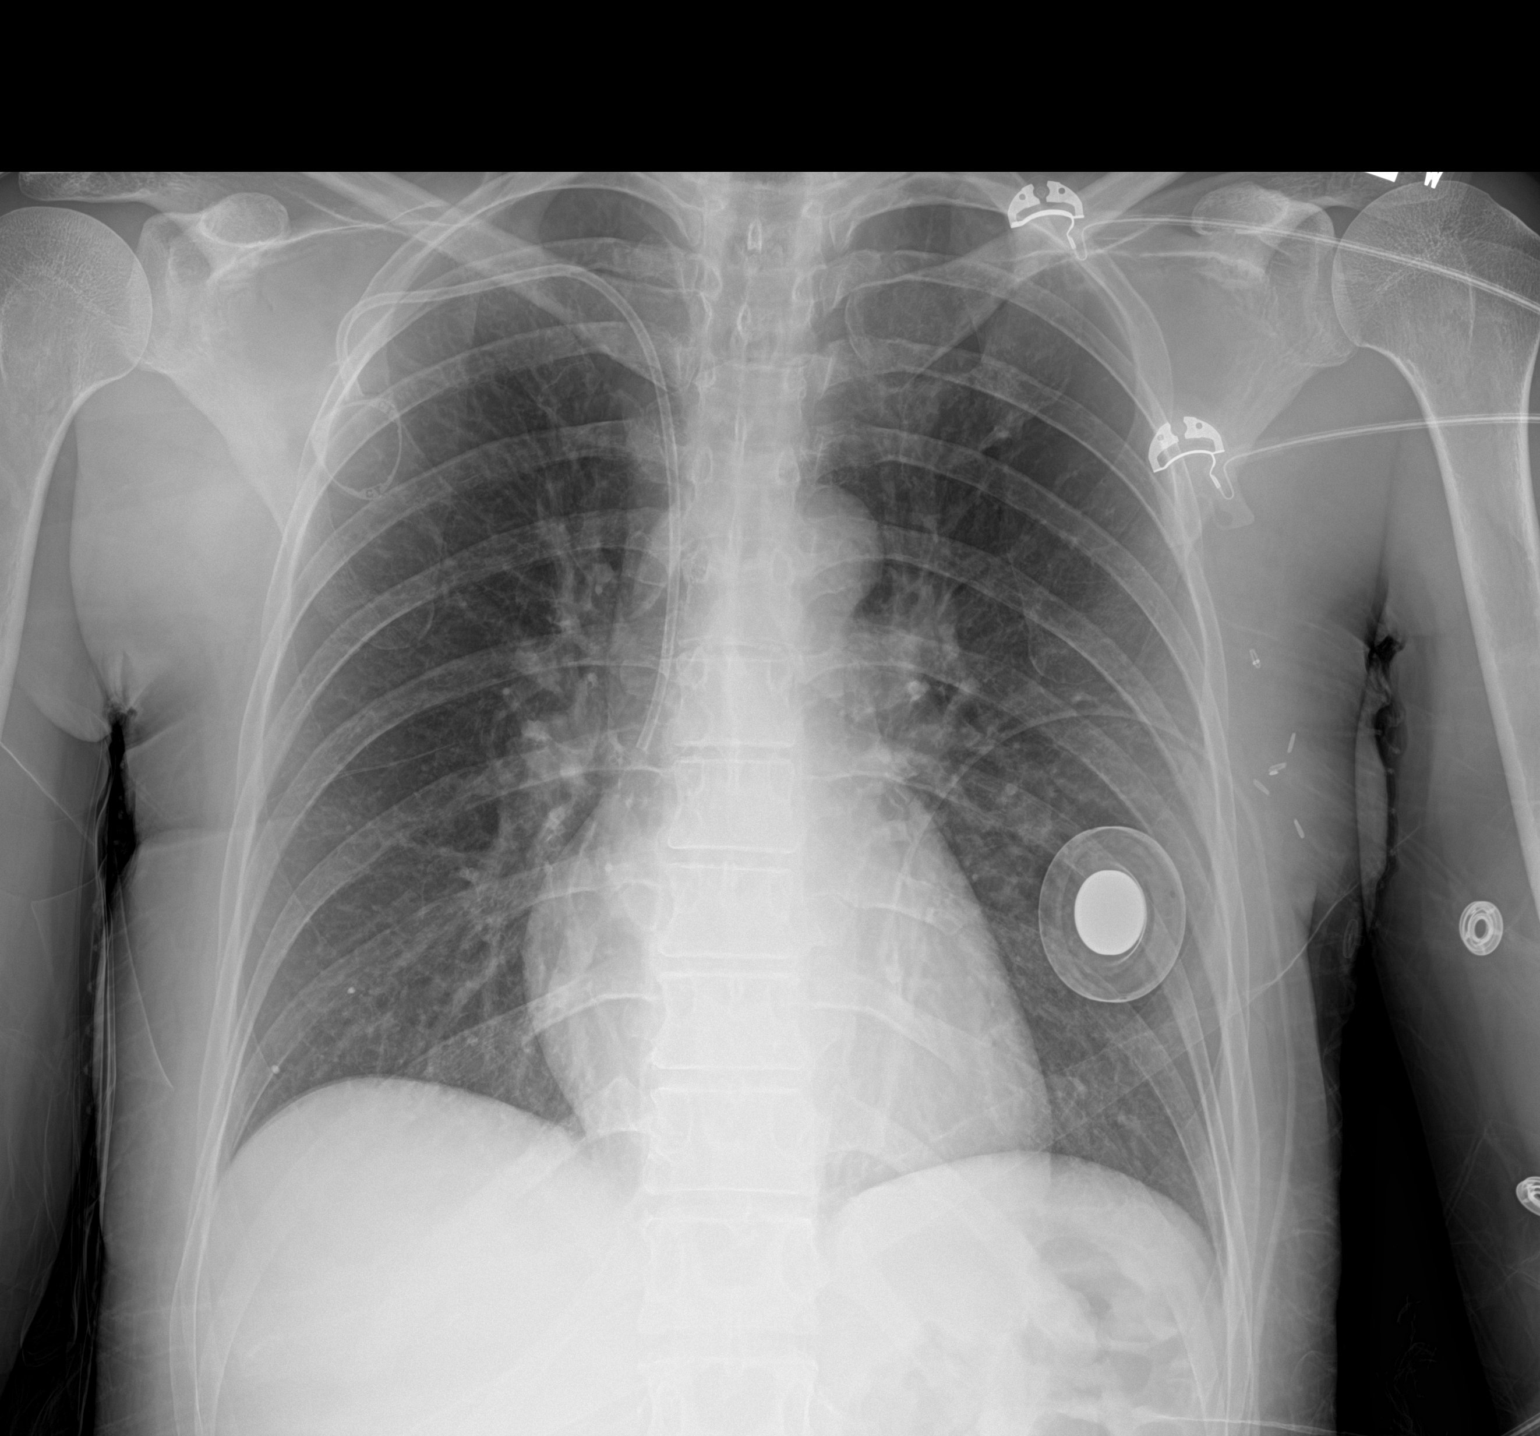

[1 of 1 positions shown; findings below may reference images not displayed]

FINDINGS: The heart size and mediastinal contours are within normal limits.
Right subclavian single-lumen Port-A-Cath has been placed with the
catheter tip in the lower SVC. There is no evidence of pulmonary
edema, consolidation, pneumothorax, nodule or pleural fluid. Tissue
expander present in the left chest wall. The visualized skeletal
structures are unremarkable.
IMPRESSION: Appropriate positioning of Port-A-Cath. No pneumothorax or other
acute findings.

## 2021-01-19 SURGERY — INSERTION, TUNNELED CENTRAL VENOUS DEVICE, WITH PORT
Anesthesia: General | Site: Breast | Laterality: Right

## 2021-01-19 MED ORDER — CHLORHEXIDINE GLUCONATE CLOTH 2 % EX PADS: 6.0000 | MEDICATED_PAD | Freq: Once | CUTANEOUS | Status: DC

## 2021-01-19 MED ORDER — PROPOFOL 10 MG/ML IV BOLUS
INTRAVENOUS | Status: DC | PRN
  Administered 2021-01-19: 180 mg via INTRAVENOUS
  Administered 2021-01-19: 40 mg via INTRAVENOUS

## 2021-01-19 MED ORDER — HEPARIN 6000 UNIT IRRIGATION SOLUTION
Status: DC | PRN
  Administered 2021-01-19: 1

## 2021-01-19 MED ORDER — HEPARIN SOD (PORK) LOCK FLUSH 100 UNIT/ML IV SOLN
INTRAVENOUS | Status: AC
  Filled 2021-01-19: qty 5

## 2021-01-19 MED ORDER — LACTATED RINGERS IV SOLN: INTRAVENOUS | Status: DC

## 2021-01-19 MED ORDER — ORAL CARE MOUTH RINSE: 15.0000 mL | Freq: Once | OROMUCOSAL | Status: AC

## 2021-01-19 MED ORDER — HEPARIN 6000 UNIT IRRIGATION SOLUTION
Status: AC
  Filled 2021-01-19: qty 500

## 2021-01-19 MED ORDER — PHENYLEPHRINE 40 MCG/ML (10ML) SYRINGE FOR IV PUSH (FOR BLOOD PRESSURE SUPPORT)
PREFILLED_SYRINGE | INTRAVENOUS | Status: DC | PRN
Start: 2021-01-19 — End: 2021-01-19
  Administered 2021-01-19: 40 ug via INTRAVENOUS

## 2021-01-19 MED ORDER — MIDAZOLAM HCL 2 MG/2ML IJ SOLN
INTRAMUSCULAR | Status: AC
  Filled 2021-01-19: qty 2

## 2021-01-19 MED ORDER — CHLORHEXIDINE GLUCONATE 0.12 % MT SOLN
OROMUCOSAL | Status: AC
  Administered 2021-01-19: 15 mL via OROMUCOSAL
  Filled 2021-01-19: qty 15

## 2021-01-19 MED ORDER — FENTANYL CITRATE (PF) 250 MCG/5ML IJ SOLN
INTRAMUSCULAR | Status: DC | PRN
  Administered 2021-01-19 (×2): 25 ug via INTRAVENOUS

## 2021-01-19 MED ORDER — GABAPENTIN 300 MG PO CAPS
ORAL_CAPSULE | ORAL | Status: AC
  Administered 2021-01-19: 300 mg via ORAL
  Filled 2021-01-19: qty 1

## 2021-01-19 MED ORDER — ENSURE PRE-SURGERY PO LIQD: 296.0000 mL | Freq: Once | ORAL | Status: DC

## 2021-01-19 MED ORDER — FENTANYL CITRATE (PF) 100 MCG/2ML IJ SOLN
INTRAMUSCULAR | Status: AC
  Filled 2021-01-19: qty 2

## 2021-01-19 MED ORDER — SCOPOLAMINE 1 MG/3DAYS TD PT72
1.0000 | MEDICATED_PATCH | TRANSDERMAL | Status: DC
  Filled 2021-01-19: qty 1

## 2021-01-19 MED ORDER — LIDOCAINE 2% (20 MG/ML) 5 ML SYRINGE
INTRAMUSCULAR | Status: DC | PRN
  Administered 2021-01-19: 80 mg via INTRAVENOUS

## 2021-01-19 MED ORDER — FENTANYL CITRATE (PF) 100 MCG/2ML IJ SOLN: 25.0000 ug | INTRAMUSCULAR | Status: DC | PRN

## 2021-01-19 MED ORDER — CHLORHEXIDINE GLUCONATE 0.12 % MT SOLN: 15.0000 mL | Freq: Once | OROMUCOSAL | Status: AC

## 2021-01-19 MED ORDER — FENTANYL CITRATE (PF) 250 MCG/5ML IJ SOLN
INTRAMUSCULAR | Status: AC
  Filled 2021-01-19: qty 5

## 2021-01-19 MED ORDER — HEPARIN SOD (PORK) LOCK FLUSH 100 UNIT/ML IV SOLN
INTRAVENOUS | Status: DC | PRN
  Administered 2021-01-19: 500 [IU]

## 2021-01-19 MED ORDER — ONDANSETRON HCL 4 MG/2ML IJ SOLN
INTRAMUSCULAR | Status: AC
  Filled 2021-01-19: qty 2

## 2021-01-19 MED ORDER — ACETAMINOPHEN 500 MG PO TABS: 1000.0000 mg | ORAL_TABLET | ORAL | Status: AC

## 2021-01-19 MED ORDER — 0.9 % SODIUM CHLORIDE (POUR BTL) OPTIME
TOPICAL | Status: DC | PRN
  Administered 2021-01-19: 1000 mL

## 2021-01-19 MED ORDER — LIDOCAINE HCL 1 % IJ SOLN
INTRAMUSCULAR | Status: DC | PRN
  Administered 2021-01-19: 10 mL via INTRADERMAL

## 2021-01-19 MED ORDER — CEFAZOLIN SODIUM-DEXTROSE 2-4 GM/100ML-% IV SOLN
2.0000 g | INTRAVENOUS | Status: AC
  Administered 2021-01-19: 2 g via INTRAVENOUS

## 2021-01-19 MED ORDER — DEXAMETHASONE SODIUM PHOSPHATE 10 MG/ML IJ SOLN
INTRAMUSCULAR | Status: DC | PRN
  Administered 2021-01-19: 10 mg via INTRAVENOUS

## 2021-01-19 MED ORDER — PROPOFOL 500 MG/50ML IV EMUL
INTRAVENOUS | Status: DC | PRN
  Administered 2021-01-19: 150 ug/kg/min via INTRAVENOUS

## 2021-01-19 MED ORDER — ACETAMINOPHEN 500 MG PO TABS
ORAL_TABLET | ORAL | Status: AC
  Administered 2021-01-19: 1000 mg via ORAL
  Filled 2021-01-19: qty 2

## 2021-01-19 MED ORDER — SCOPOLAMINE 1 MG/3DAYS TD PT72
MEDICATED_PATCH | TRANSDERMAL | Status: AC
  Administered 2021-01-19: 1.5 mg via TRANSDERMAL
  Filled 2021-01-19: qty 1

## 2021-01-19 MED ORDER — ONDANSETRON HCL 4 MG/2ML IJ SOLN
INTRAMUSCULAR | Status: DC | PRN
  Administered 2021-01-19: 4 mg via INTRAVENOUS

## 2021-01-19 MED ORDER — DEXAMETHASONE SODIUM PHOSPHATE 10 MG/ML IJ SOLN
INTRAMUSCULAR | Status: AC
  Filled 2021-01-19: qty 1

## 2021-01-19 MED ORDER — MIDAZOLAM HCL 2 MG/2ML IJ SOLN
INTRAMUSCULAR | Status: DC | PRN
  Administered 2021-01-19: 2 mg via INTRAVENOUS

## 2021-01-19 MED ORDER — CEFAZOLIN SODIUM-DEXTROSE 2-4 GM/100ML-% IV SOLN
INTRAVENOUS | Status: AC
  Filled 2021-01-19: qty 100

## 2021-01-19 MED ORDER — BUPIVACAINE-EPINEPHRINE (PF) 0.25% -1:200000 IJ SOLN
INTRAMUSCULAR | Status: AC
  Filled 2021-01-19: qty 30

## 2021-01-19 MED ORDER — LIDOCAINE HCL 1 % IJ SOLN
INTRAMUSCULAR | Status: AC
  Filled 2021-01-19: qty 20

## 2021-01-19 MED ORDER — GABAPENTIN 300 MG PO CAPS: 300.0000 mg | ORAL_CAPSULE | ORAL | Status: AC

## 2021-01-19 MED ORDER — DEXMEDETOMIDINE (PRECEDEX) IN NS 20 MCG/5ML (4 MCG/ML) IV SYRINGE
PREFILLED_SYRINGE | INTRAVENOUS | Status: DC | PRN
  Administered 2021-01-19 (×2): 4 ug via INTRAVENOUS

## 2021-01-19 MED ORDER — PROMETHAZINE HCL 25 MG/ML IJ SOLN: 6.2500 mg | INTRAMUSCULAR | Status: DC | PRN

## 2021-01-19 SURGICAL SUPPLY — 43 items
BAG COUNTER SPONGE SURGICOUNT (BAG) ×2 IMPLANT
BAG DECANTER FOR FLEXI CONT (MISCELLANEOUS) ×3 IMPLANT
BAG SURGICOUNT SPONGE COUNTING (BAG) ×1
CANISTER SUCT 3000ML PPV (MISCELLANEOUS) IMPLANT
CHLORAPREP W/TINT 26 (MISCELLANEOUS) ×3 IMPLANT
COVER SURGICAL LIGHT HANDLE (MISCELLANEOUS) ×3 IMPLANT
COVER TRANSDUCER ULTRASND GEL (DISPOSABLE) IMPLANT
DECANTER SPIKE VIAL GLASS SM (MISCELLANEOUS) ×4 IMPLANT
DERMABOND ADVANCED (GAUZE/BANDAGES/DRESSINGS) ×2
DERMABOND ADVANCED .7 DNX12 (GAUZE/BANDAGES/DRESSINGS) ×1 IMPLANT
DRAPE C-ARM 42X120 X-RAY (DRAPES) ×3 IMPLANT
DRAPE CHEST BREAST 15X10 FENES (DRAPES) ×3 IMPLANT
DRAPE WARM FLUID 44X44 (DRAPES) IMPLANT
ELECT COATED BLADE 2.86 ST (ELECTRODE) ×3 IMPLANT
ELECT REM PT RETURN 9FT ADLT (ELECTROSURGICAL) ×3
ELECTRODE REM PT RTRN 9FT ADLT (ELECTROSURGICAL) ×1 IMPLANT
GAUZE 4X4 16PLY ~~LOC~~+RFID DBL (SPONGE) ×1 IMPLANT
GEL ULTRASOUND 20GR AQUASONIC (MISCELLANEOUS) IMPLANT
GLOVE SURG ENC MOIS LTX SZ6 (GLOVE) ×3 IMPLANT
GLOVE SURG UNDER LTX SZ6.5 (GLOVE) ×3 IMPLANT
GOWN STRL REUS W/ TWL LRG LVL3 (GOWN DISPOSABLE) ×1 IMPLANT
GOWN STRL REUS W/TWL 2XL LVL3 (GOWN DISPOSABLE) ×3 IMPLANT
GOWN STRL REUS W/TWL LRG LVL3 (GOWN DISPOSABLE) ×2
KIT BASIN OR (CUSTOM PROCEDURE TRAY) ×3 IMPLANT
KIT PORT POWER 8FR ISP CVUE (Port) ×2 IMPLANT
KIT TURNOVER KIT B (KITS) ×3 IMPLANT
NEEDLE 22X1 1/2 (OR ONLY) (NEEDLE) ×3 IMPLANT
NS IRRIG 1000ML POUR BTL (IV SOLUTION) ×3 IMPLANT
PAD ARMBOARD 7.5X6 YLW CONV (MISCELLANEOUS) ×3 IMPLANT
PENCIL BUTTON HOLSTER BLD 10FT (ELECTRODE) ×3 IMPLANT
POSITIONER HEAD DONUT 9IN (MISCELLANEOUS) ×3 IMPLANT
SPONGE T-LAP 18X18 ~~LOC~~+RFID (SPONGE) ×2 IMPLANT
SUT MON AB 4-0 PC3 18 (SUTURE) ×3 IMPLANT
SUT PROLENE 2 0 SH DA (SUTURE) ×6 IMPLANT
SUT VIC AB 3-0 SH 27 (SUTURE) ×2
SUT VIC AB 3-0 SH 27X BRD (SUTURE) ×1 IMPLANT
SYR 5ML LUER SLIP (SYRINGE) ×3 IMPLANT
TOWEL GREEN STERILE (TOWEL DISPOSABLE) ×3 IMPLANT
TOWEL GREEN STERILE FF (TOWEL DISPOSABLE) ×3 IMPLANT
TRAY LAPAROSCOPIC MC (CUSTOM PROCEDURE TRAY) ×1 IMPLANT
TUBE CONNECTING 12'X1/4 (SUCTIONS)
TUBE CONNECTING 12X1/4 (SUCTIONS) IMPLANT
YANKAUER SUCT BULB TIP NO VENT (SUCTIONS) IMPLANT

## 2021-01-19 NOTE — Anesthesia Procedure Notes (Signed)
Procedure Name: LMA Insertion Date/Time: 01/19/2021 10:01 AM Performed by: Lorie Phenix, CRNA Pre-anesthesia Checklist: Patient identified, Emergency Drugs available, Suction available and Patient being monitored Patient Re-evaluated:Patient Re-evaluated prior to induction Oxygen Delivery Method: Circle System Utilized Preoxygenation: Pre-oxygenation with 100% oxygen Induction Type: IV induction Ventilation: Mask ventilation without difficulty LMA: LMA inserted LMA Size: 4.0 Number of attempts: 1 Placement Confirmation: positive ETCO2 Tube secured with: Tape Dental Injury: Teeth and Oropharynx as per pre-operative assessment

## 2021-01-19 NOTE — Discharge Instructions (Addendum)
[name] Office Phone Number [phone]   POST OP INSTRUCTIONS  Always review your discharge instruction sheet given to you by the facility where your surgery was performed.  IF YOU HAVE DISABILITY OR FAMILY LEAVE FORMS, YOU MUST BRING THEM TO THE OFFICE FOR PROCESSING.  DO NOT GIVE THEM TO YOUR DOCTOR.  A prescription for pain medication may be given to you upon discharge.  Take your pain medication as prescribed, if needed.  If narcotic pain medicine is not needed, then you may take acetaminophen (Tylenol) or ibuprofen (Advil) as needed. Take your usually prescribed medications unless otherwise directed If you need a refill on your pain medication, please contact your pharmacy.  They will contact our office to request authorization.  Prescriptions will not be filled after 5pm or on week-ends. You should eat very light the first 24 hours after surgery, such as soup, crackers, pudding, etc.  Resume your normal diet the day after surgery It is common to experience some constipation if taking pain medication after surgery.  Increasing fluid intake and taking a stool softener will usually help or prevent this problem from occurring.  A mild laxative (Milk of Magnesia or Miralax) should be taken according to package directions if there are no bowel movements after 48 hours. You may shower in 48 hours.  The surgical glue will flake off in 2-3 weeks.   ACTIVITIES:  No strenuous activity or heavy lifting until cleared by plastic surgery. You may drive when you no longer are taking prescription pain medication, you can comfortably wear a seatbelt, and you can safely maneuver your car and apply brakes. RETURN TO WORK:  __________to be determined.  _______________ Pamela Reyes should see your doctor in the office for a follow-up appointment approximately three-four weeks after your surgery.    WHEN TO CALL YOUR DOCTOR: Fever over 101.0 Nausea and/or vomiting. Extreme swelling or  bruising. Continued bleeding from incision. Increased pain, redness, or drainage from the incision.  The clinic staff is available to answer your questions during regular business hours.  Please dont hesitate to call and ask to speak to one of the nurses for clinical concerns.  If you have a medical emergency, go to the nearest emergency room or call 911.  A surgeon from [facility] is always on call at the hospital.  For further questions, please visit [email or web address]

## 2021-01-19 NOTE — Anesthesia Postprocedure Evaluation (Signed)
Anesthesia Post Note  Patient: GRACELYNNE BENEDICT  Procedure(s) Performed: INSERTION PORT-A-CATH (Right: Breast)     Patient location during evaluation: PACU Anesthesia Type: General Level of consciousness: awake and alert, awake and oriented Pain management: pain level controlled Vital Signs Assessment: post-procedure vital signs reviewed and stable Respiratory status: spontaneous breathing, nonlabored ventilation and respiratory function stable Cardiovascular status: stable and blood pressure returned to baseline Postop Assessment: no apparent nausea or vomiting Anesthetic complications: no   No notable events documented.  Last Vitals:  Vitals:   01/19/21 1119 01/19/21 1134  BP: 104/61 113/70  Pulse: 71 68  Resp: 14 15  Temp:  36.4 C  SpO2: 100% 100%    Last Pain:  Vitals:   01/19/21 1134  TempSrc:   PainSc: 0-No pain                 Catalina Gravel

## 2021-01-19 NOTE — Telephone Encounter (Signed)
Spoke with patient about her appts for chemo class and echo. Patient wants to keep these appts as is for 12/29.  She would like to start chemo 1/5 or 1/6 and I have placed schedule message for this.  She is also having some pain in her right axilla for which Dr. Barry Dienes has ordered an [place]. Informed her someone would to get this scheduled. Patient verbalized understanding.

## 2021-01-19 NOTE — Interval H&P Note (Signed)
History and Physical Interval Note:  01/19/2021 9:40 AM  [place]  has presented today for surgery, with the diagnosis of LEFT BREAST CANCER.  The various methods of treatment have been discussed with the patient and family. After consideration of risks, benefits and other options for treatment, the patient has consented to  Procedure(s): INSERTION PORT-A-CATH (N/A) as a surgical intervention. She was found to have 4/5 LN positive and will do chemotherapy as the next step.   The patient's history has been reviewed, patient examined, no change in status, stable for surgery.  I have reviewed the patient's chart and labs.  Questions were answered to the patient's satisfaction.     Stark Klein

## 2021-01-19 NOTE — Anesthesia Preprocedure Evaluation (Addendum)
Anesthesia Evaluation  Patient identified by MRN, date of birth, ID band Patient awake    Reviewed: Allergy & Precautions, NPO status , Patient's Chart, lab work & pertinent test results  History of Anesthesia Complications (+) PONV and history of anesthetic complications  Airway Mallampati: II  TM Distance: >3 FB Neck ROM: Full    Dental  (+) Teeth Intact, Dental Advisory Given   Pulmonary former smoker,    Pulmonary exam normal breath sounds clear to auscultation       Cardiovascular negative cardio ROS Normal cardiovascular exam Rhythm:Regular Rate:Normal     Neuro/Psych PSYCHIATRIC DISORDERS Anxiety negative neurological ROS     GI/Hepatic Neg liver ROS, hiatal hernia, GERD  Medicated,  Endo/Other  negative endocrine ROS  Renal/GU negative Renal ROS     Musculoskeletal negative musculoskeletal ROS (+)   Abdominal   Peds  Hematology negative hematology ROS (+)   Anesthesia Other Findings Day of surgery medications reviewed with the patient.   LEFT BREAST CANCER  Reproductive/Obstetrics                            Anesthesia Physical Anesthesia Plan  ASA: 2  Anesthesia Plan: General   Post-op Pain Management:    Induction: Intravenous  PONV Risk Score and Plan: 3 and Midazolam, Dexamethasone, Ondansetron, TIVA and Scopolamine patch - Pre-op  Airway Management Planned: LMA  Additional Equipment:   Intra-op Plan:   Post-operative Plan: Extubation in OR  Informed Consent: I have reviewed the patients History and Physical, chart, labs and discussed the procedure including the risks, benefits and alternatives for the proposed anesthesia with the patient or authorized representative who has indicated his/her understanding and acceptance.     Dental advisory given  Plan Discussed with: CRNA  Anesthesia Plan Comments:        Anesthesia Quick Evaluation

## 2021-01-19 NOTE — Transfer of Care (Signed)
Immediate Anesthesia Transfer of Care Note  Patient: Pamela Reyes  Procedure(s) Performed: INSERTION PORT-A-CATH (Right: Breast)  Patient Location: PACU  Anesthesia Type:General  Level of Consciousness: awake and alert   Airway & Oxygen Therapy: Patient Spontanous Breathing and Patient connected to face mask oxygen  Post-op Assessment: Report given to RN and Post -op Vital signs reviewed and stable  Post vital signs: Reviewed and stable  Last Vitals:  Vitals Value Taken Time  BP 104/48 01/19/21 1104  Temp    Pulse 79 01/19/21 1105  Resp 13 01/19/21 1105  SpO2 98 % 01/19/21 1105  Vitals shown include unvalidated device data.  Last Pain:  Vitals:   01/19/21 0800  TempSrc:   PainSc: 3          Complications: No notable events documented.

## 2021-01-19 NOTE — Op Note (Signed)
PREOPERATIVE DIAGNOSIS:  left breast cancer     POSTOPERATIVE DIAGNOSIS:  Same     PROCEDURE: Right subclavian port placement, Bard ClearVue Power Port, MRI safe, 8-French.      SURGEON:  Stark Klein, MD      ANESTHESIA:  General   FINDINGS:  Good venous return, easy flush, and tip of the catheter and   SVC 19 cm.      SPECIMEN:  None.      ESTIMATED BLOOD LOSS:  Minimal.      COMPLICATIONS:  None known.      PROCEDURE:  Pt was identified in the holding area and taken to   the operating room, where patient was placed supine on the operating room   table.  General anesthesia was induced.  Patient's arms were tucked and the upper   chest and neck were prepped and draped in sterile fashion.  Time-out was   performed according to the surgical safety check list.  When all was   correct, we continued.   Local anesthetic was administered over this   area at the angle of the clavicle.  The vein was accessed with 2 pass(es) of the needle. There was good venous return and the wire passed easily with no ectopy.   Fluoroscopy was used to confirm that the wire was in the vena cava.      The patient was placed back level and the area for the pocket was anethetized   with local anesthetic.  A 3-cm transverse incision was made with a #15   blade.  Cautery was used to divide the subcutaneous tissues down to the   pectoralis muscle.  An [employer] retractor was used to elevate the skin   while a pocket was created on top of the pectoralis fascia.  The port   was placed into the pocket to confirm that it was of adequate size.  The   catheter was preattached to the port.  The port was then secured to the   pectoralis fascia with four 2-0 Prolene sutures.  These were clamped and   not tied down yet.    The catheter was tunneled through to the wire exit   site.  The catheter was placed along the wire to determine what length it should be to be in the SVC.  The catheter was cut at 19 cm.  The  tunneler sheath and dilator were passed over the wire and the dilator and wire were removed.  The catheter was advanced through the tunneler sheath and the tunneler sheath was pulled away.  Care was taken to keep the catheter in the tunneler sheath as this occurred. This was advanced and the tunneler sheath was removed.  There was good venous   return and easy flush of the catheter.  The Prolene sutures were tied   down to the pectoral fascia.  The skin was reapproximated using 3-0   Vicryl interrupted deep dermal sutures.    Fluoroscopy was used to re-confirm good position of the catheter.  The skin   was then closed using 4-0 Monocryl in a subcuticular fashion.  The port was flushed with concentrated heparin flush as well.  The wounds were then cleaned, dried, and dressed with Dermabond.  The patient was awakened from anesthesia and taken to the PACU in stable condition.  Needle, sponge, and instrument counts were correct.               Stark Klein, MD

## 2021-01-20 ENCOUNTER — Encounter (HOSPITAL_COMMUNITY): Payer: Self-pay | Admitting: General Surgery

## 2021-01-21 ENCOUNTER — Encounter: Payer: Self-pay | Admitting: *Deleted

## 2021-01-21 ENCOUNTER — Encounter (HOSPITAL_COMMUNITY): Payer: Self-pay | Admitting: General Surgery

## 2021-01-22 ENCOUNTER — Other Ambulatory Visit: Payer: Self-pay | Admitting: General Surgery

## 2021-01-22 ENCOUNTER — Ambulatory Visit
Admission: RE | Admit: 2021-01-22 | Discharge: 2021-01-22 | Disposition: A | Discharge status: Home / Self Care | Payer: BC Managed Care – PPO | Source: Ambulatory Visit | Attending: General Surgery | Admitting: General Surgery

## 2021-01-22 ENCOUNTER — Other Ambulatory Visit: Payer: BC Managed Care – PPO

## 2021-01-22 DIAGNOSIS — M79621 Pain in right upper arm: Secondary | ICD-10-CM

## 2021-01-22 IMAGING — MG MM DIGITAL DIAGNOSTIC UNILAT*R* W/ TOMO W/ CAD
8 series · 8 of 24 positions shown · non-contrast
Comparison: Previous exam(s).

CLINICAL DATA: 40-year-old who underwent LEFT mastectomy and
sentinel node/targeted LEFT axillary node resection on [DATE],
presenting now with focal pain in the low RIGHT axilla. (The patient
presented with focal pain in the LEFT breast at the time of cancer
diagnosis).
TECHNIQUE: Right digital diagnostic mammography and breast tomosynthesis was
performed. The images were evaluated with computer-aided detection.;
Targeted ultrasound examination of the right axilla was performed.

[R MLO synth-2D (1 of 3)]
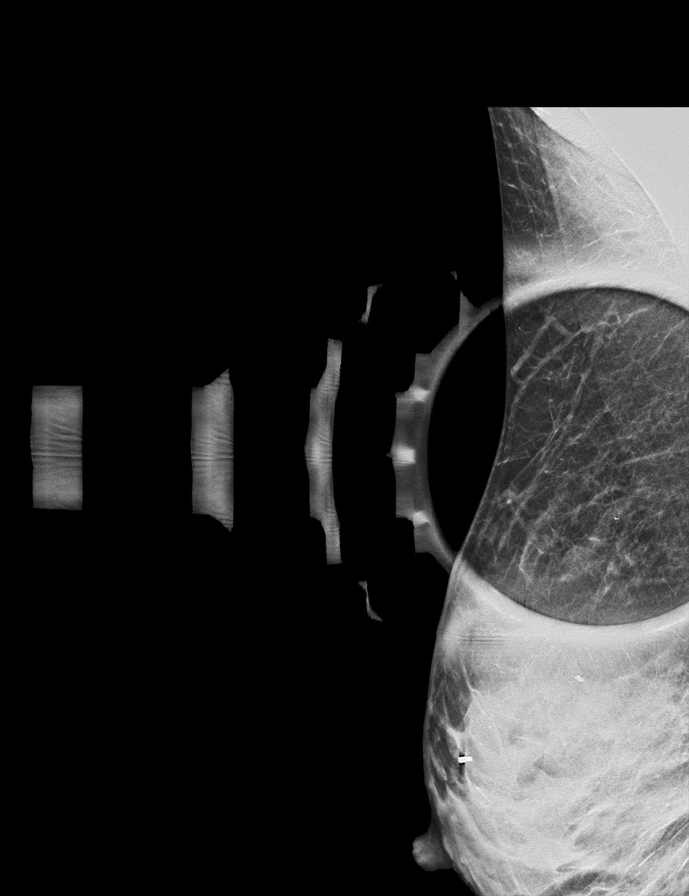

[R MLO synth-2D (2 of 3)]
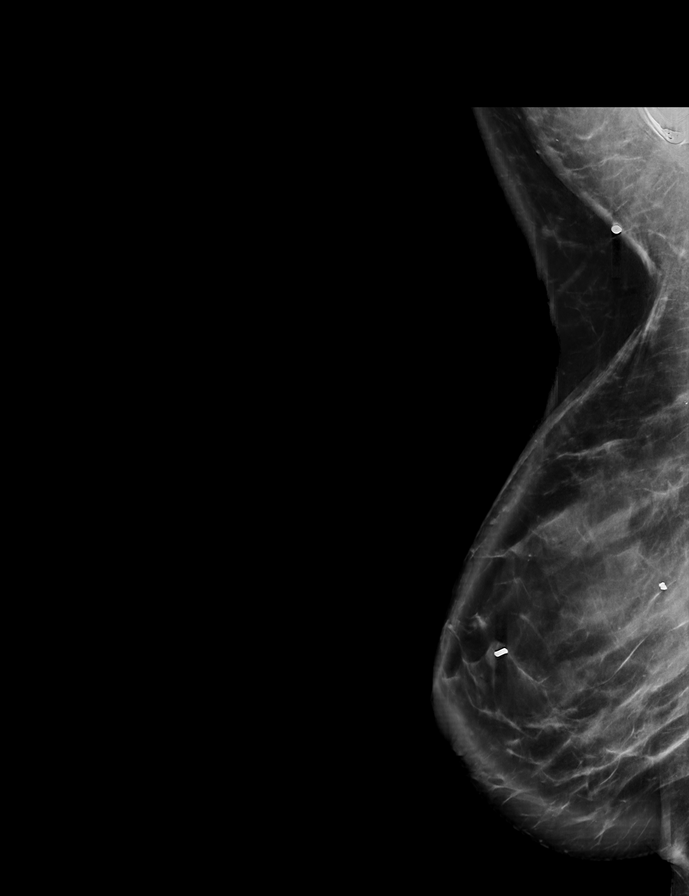

[R MLO synth-2D (3 of 3)]
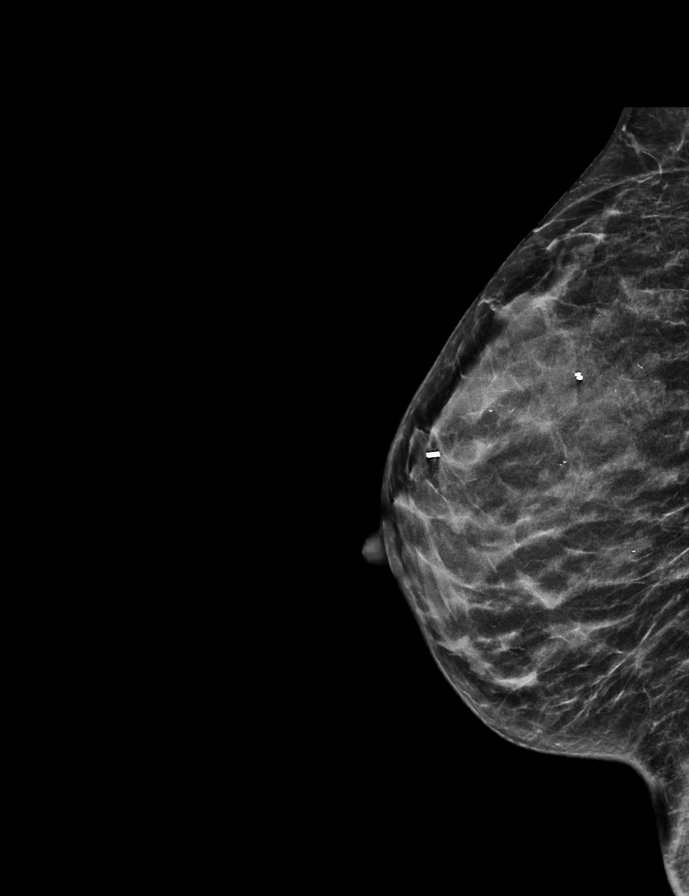

[R CC synth-2D]
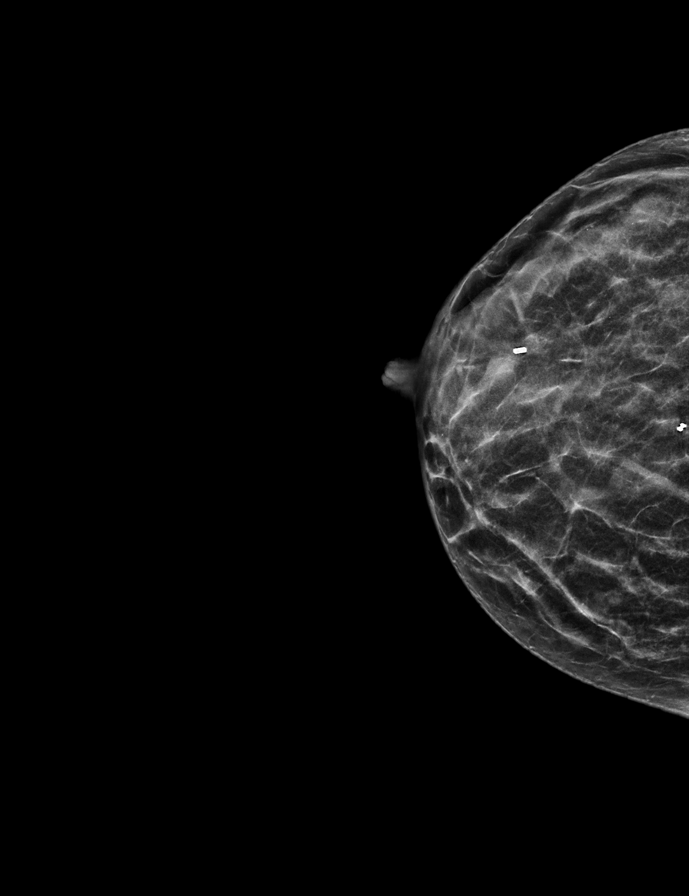

[R CC tomo · tomo slice 20/39.0]
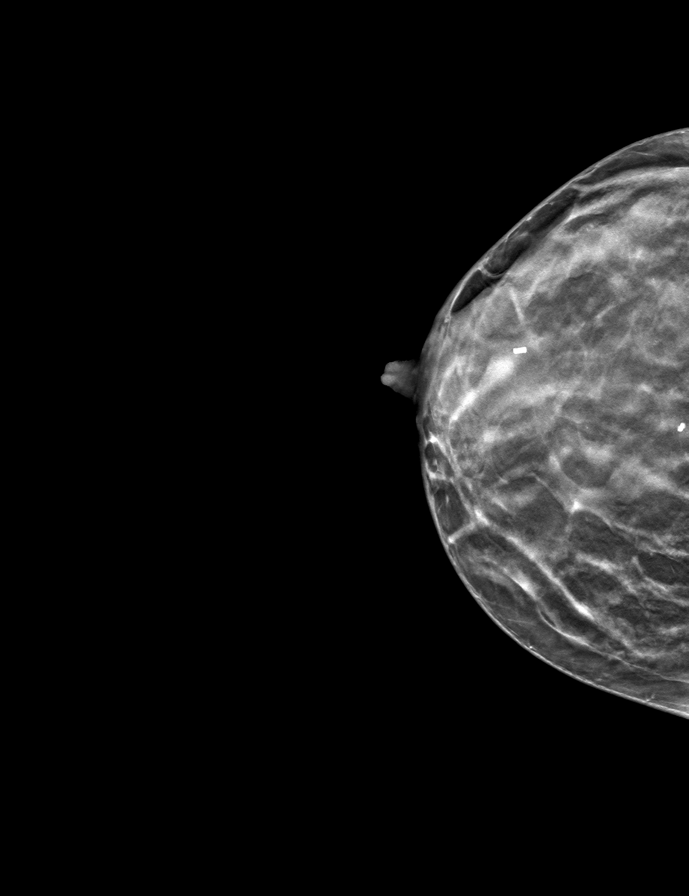

[R MLO tomo (1 of 3) · tomo slice 19/36.0]
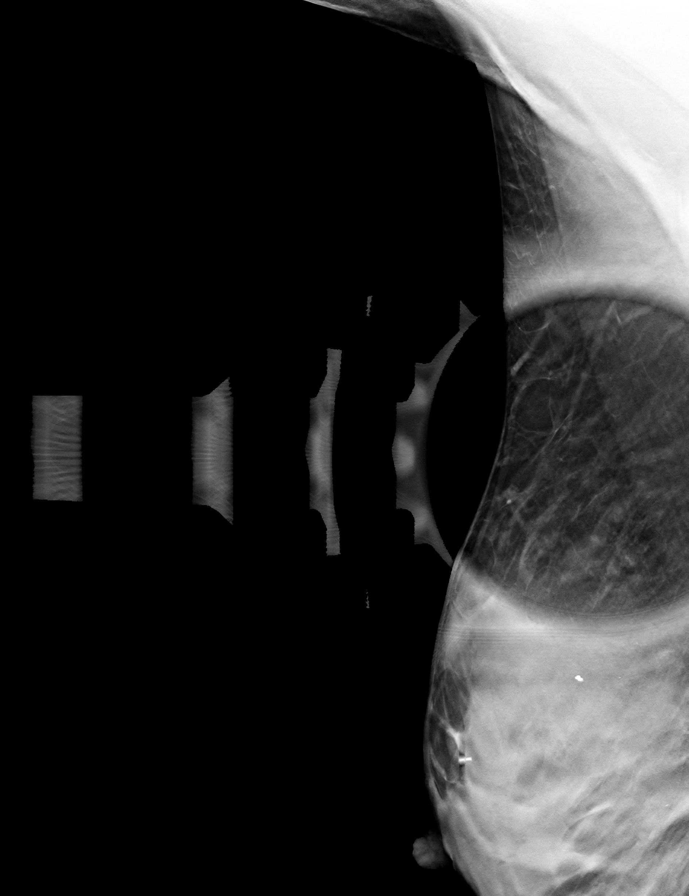

[R MLO tomo (2 of 3) · tomo slice 45/90.0]
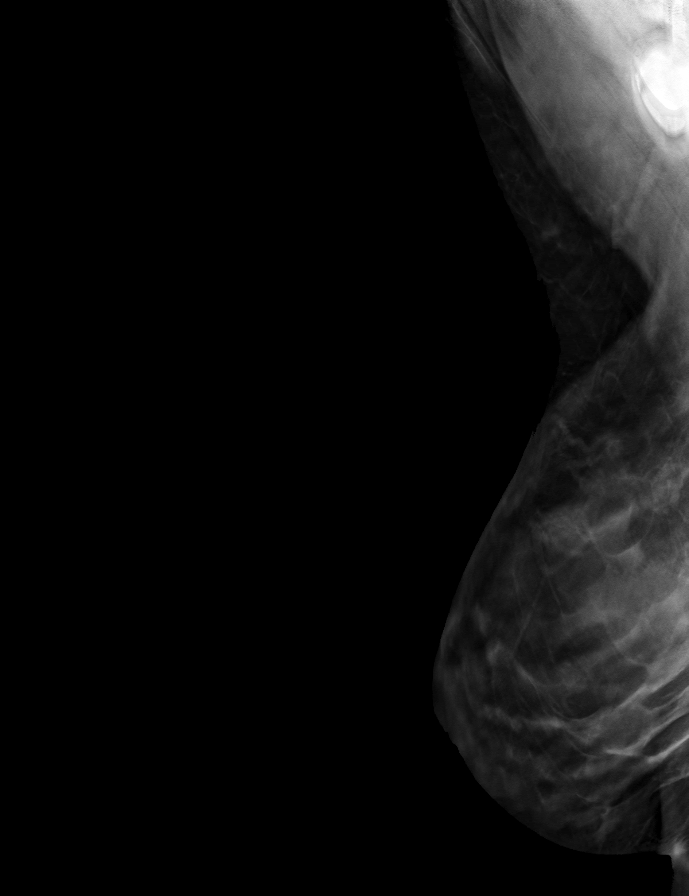

[R MLO tomo (3 of 3) · tomo slice 22/43.0]
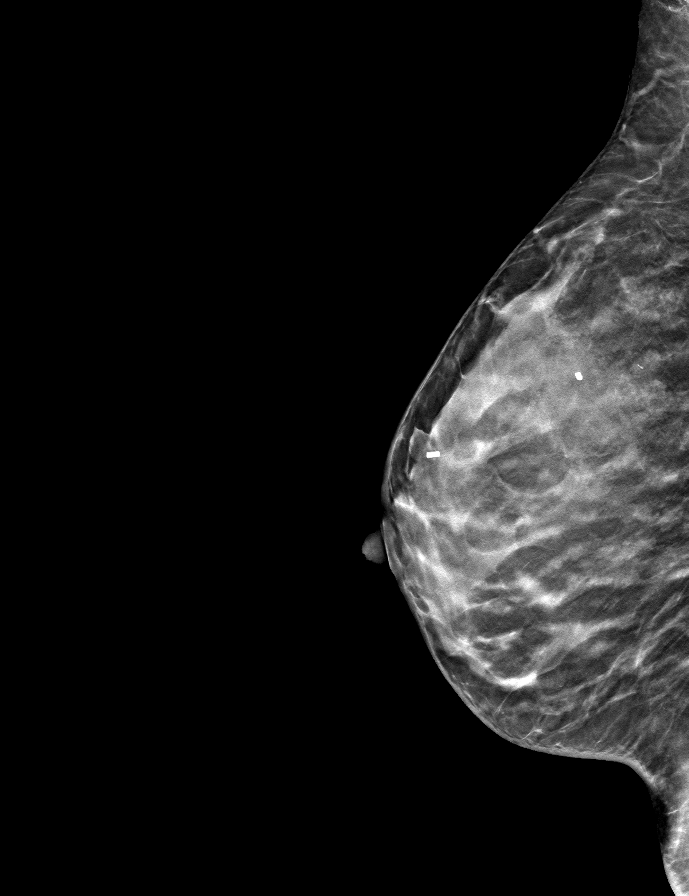

[8 of 24 positions shown; findings below may reference images not displayed]

MRI guided biopsy of a mass in the upper retroareolar RIGHT breast
at anterior depth on [DATE], pathology revealing fibroadenoma,
fibrocystic changes, PASH and focal chronic periductal inflammation
(cylinder clip).

A focus of non-mass enhancement in the upper RIGHT breast at
posterior depth could not be biopsied at that time due to technical
issues with the biopsy device, and a barbell shaped tissue marker
clip was placed at this location.

Strong family history of breast cancer in her sister diagnosed at
age 47, in 2 paternal aunts, and in her paternal grandmother.

EXAM:
DIGITAL DIAGNOSTIC UNILATERAL RIGHT MAMMOGRAM WITH TOMOSYNTHESIS AND
CAD; US AXILLARY RIGHT
ACR Breast Density Category d: The breast tissue is extremely dense,
which lowers the sensitivity of mammography.
FINDINGS: Full field CC and MLO views and a spot tangential view of the site
of focal pain were obtained.

No mammographic abnormality in the RIGHT axilla at the site of focal
pain. The Port-A-Cath port underlies this location.

Biopsy-proven fibroadenoma associated with the cylinder shaped
tissue marking clip in the slight upper outer retroareolar location
at anterior depth.

No mammographic abnormality in the upper breast at posterior depth,
near 12 o'clock location associated with the barbell tissue marking
clip, placed at the site of non-mass enhancement at the time of
prior MR biopsy. As mentioned, the non-mass enhancement was not
biopsied at that time due to a malfunction in the biopsy device.

No new or suspicious findings.

Targeted ultrasound is performed at the site of focal pain in the
low axilla, demonstrating a normal axillary fat pad. There is no
mass or pathologic lymphadenopathy.
IMPRESSION: 1. No mammographic evidence of malignancy involving the RIGHT
breast.
2. No mass or pathologic lymphadenopathy in the low RIGHT axilla at
the site of focal pain.
3. No mammographic abnormality in the upper breast at posterior
depth associated with the barbell clip placed at the site of
non-mass enhancement at the time of the prior MR breast biopsy. The
non-mass enhancement was not biopsied at that time due to a
malfunction in the biopsy device.

RECOMMENDATION:
1. Screening RIGHT mammogram in 1 year.
2. Breast MRI in 6 months to follow-up the non-mass enhancement in
the upper RIGHT breast at posterior depth.

The patient and I discussed short-term interval MRI follow-up of the
non-mass enhancement versus stereotactic tomosynthesis core needle
biopsy using the barbell clip as the target for the biopsy. She
wishes to pursue the short-term interval follow-up MRI in 6 months.

I have discussed the findings and recommendations with the patient.
If applicable, a reminder letter will be sent to the patient
regarding the next appointment.

BI-RADS CATEGORY  2: Benign.

## 2021-01-22 IMAGING — US US AXILLARY RIGHT
1 series · 6 of 6 positions shown · non-contrast
Comparison: Previous exam(s).

CLINICAL DATA: 40-year-old who underwent LEFT mastectomy and
sentinel node/targeted LEFT axillary node resection on [DATE],
presenting now with focal pain in the low RIGHT axilla. (The patient
presented with focal pain in the LEFT breast at the time of cancer
diagnosis).
TECHNIQUE: Right digital diagnostic mammography and breast tomosynthesis was
performed. The images were evaluated with computer-aided detection.;
Targeted ultrasound examination of the right axilla was performed.

[Series 1: us axillary right · 0.05mm/px · 6 of 6 slices shown]
[im 1/6]
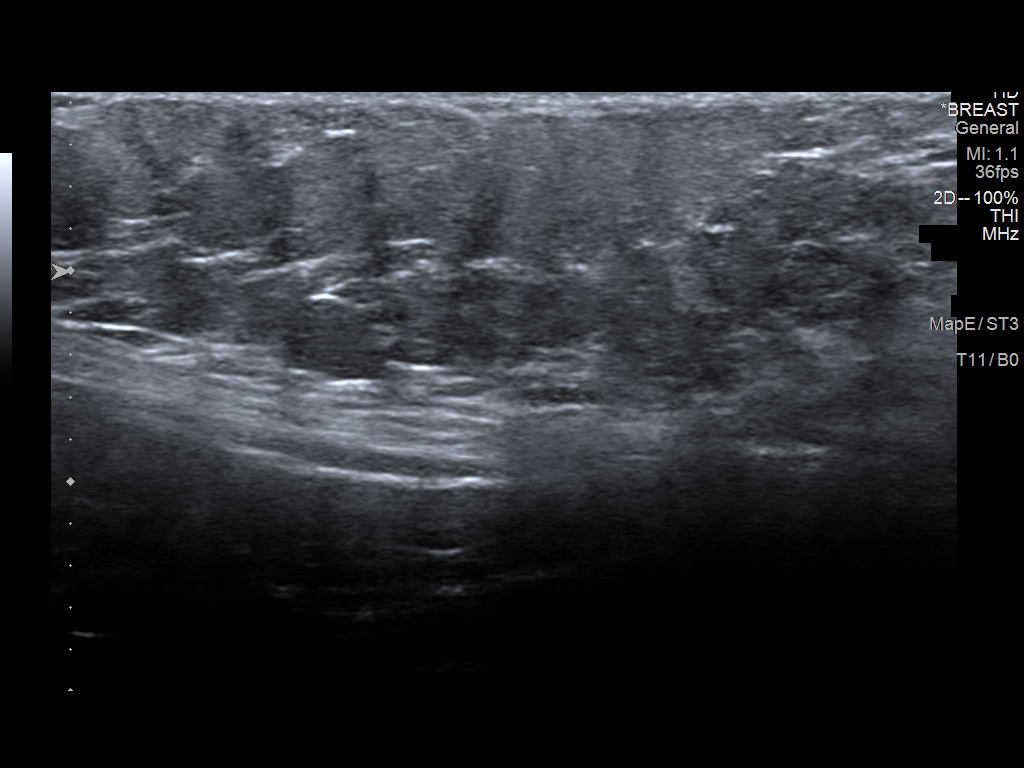
[im 2/6]
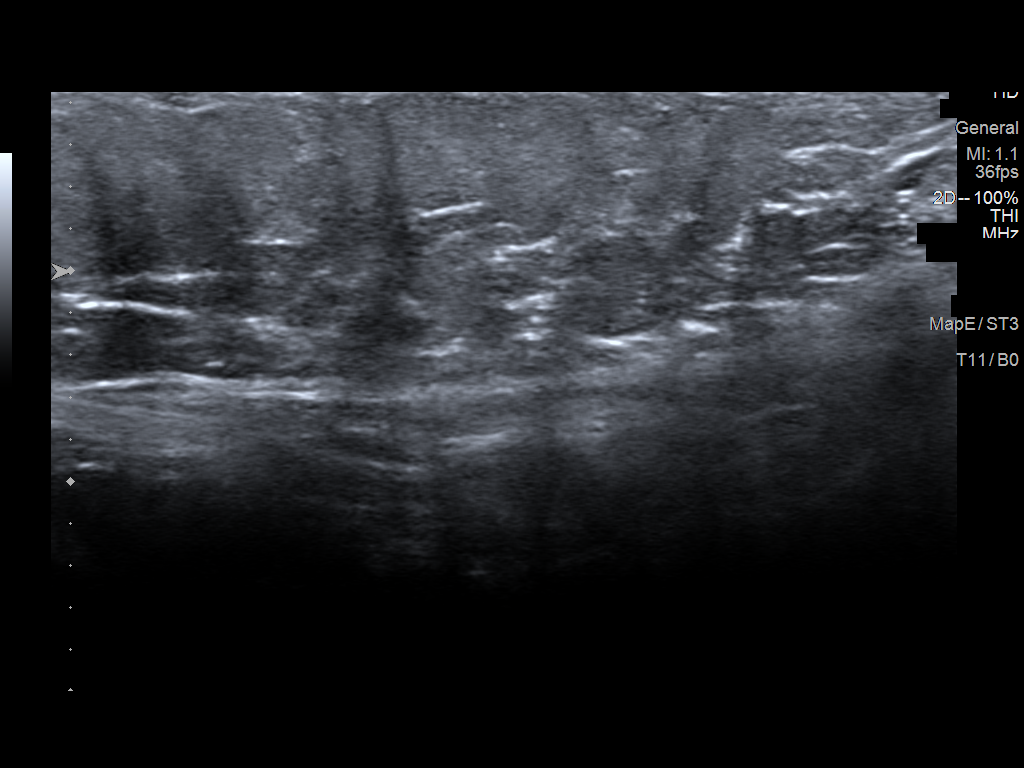
[im 3/6]
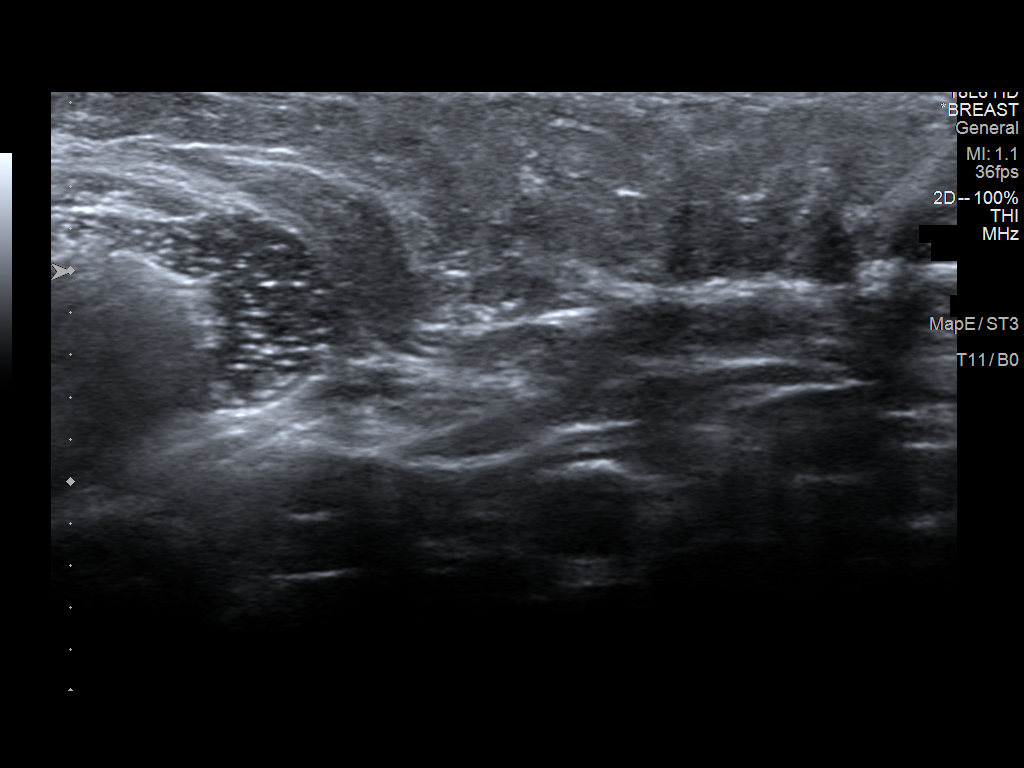
[im 4/6]
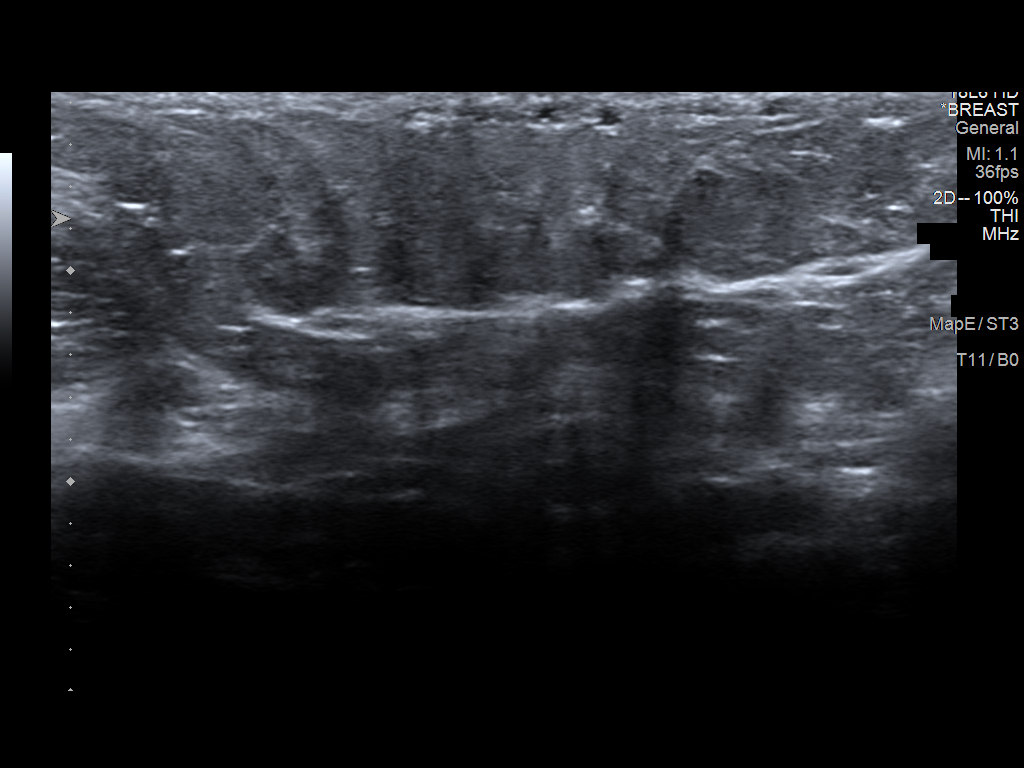
[im 5/6]
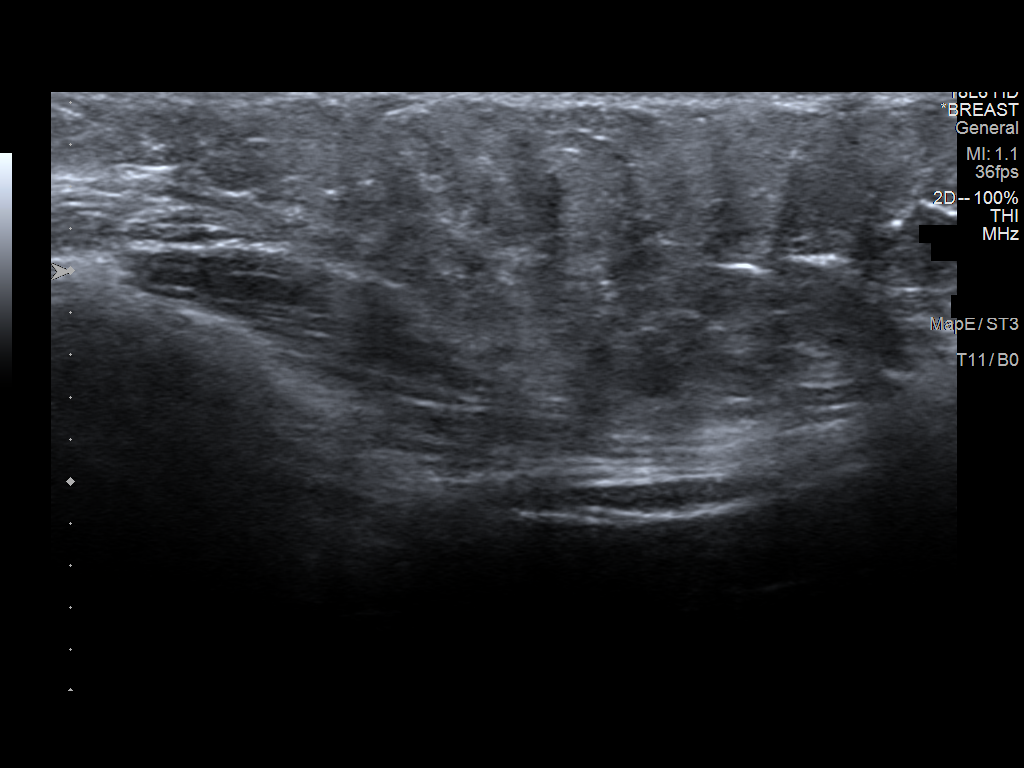
[im 6/6]
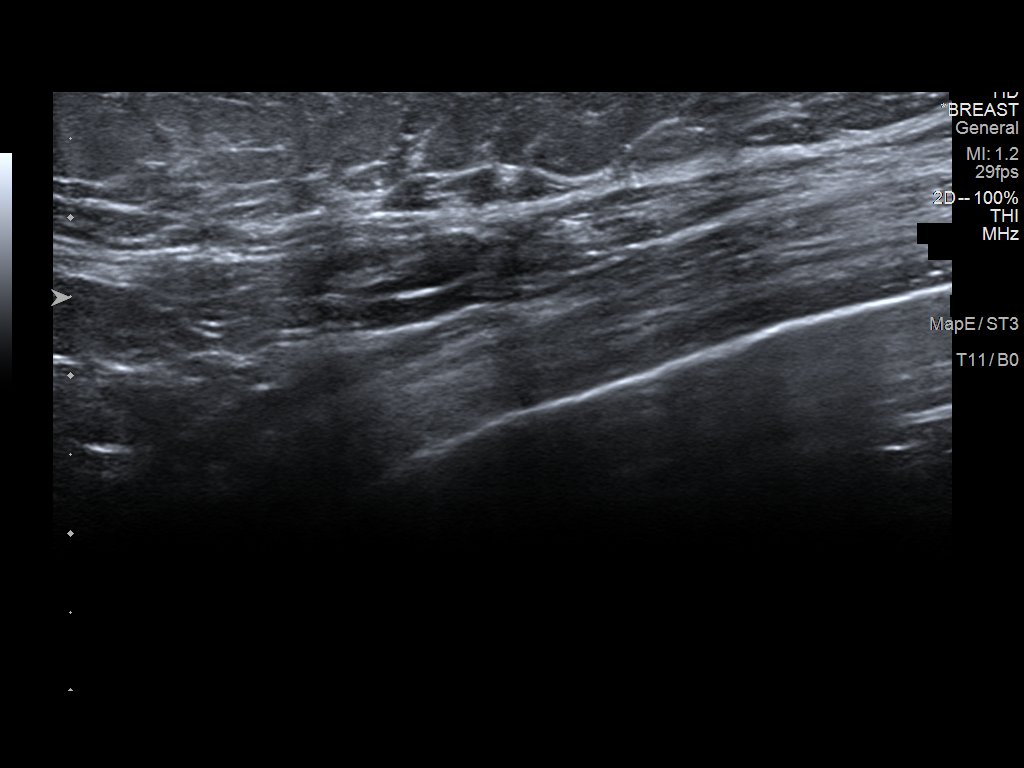

[6 of 6 positions shown; findings below may reference images not displayed]

MRI guided biopsy of a mass in the upper retroareolar RIGHT breast
at anterior depth on [DATE], pathology revealing fibroadenoma,
fibrocystic changes, PASH and focal chronic periductal inflammation
(cylinder clip).

A focus of non-mass enhancement in the upper RIGHT breast at
posterior depth could not be biopsied at that time due to technical
issues with the biopsy device, and a barbell shaped tissue marker
clip was placed at this location.

Strong family history of breast cancer in her sister diagnosed at
age 47, in 2 paternal aunts, and in her paternal grandmother.

EXAM:
DIGITAL DIAGNOSTIC UNILATERAL RIGHT MAMMOGRAM WITH TOMOSYNTHESIS AND
CAD; US AXILLARY RIGHT
ACR Breast Density Category d: The breast tissue is extremely dense,
which lowers the sensitivity of mammography.
FINDINGS: Full field CC and MLO views and a spot tangential view of the site
of focal pain were obtained.

No mammographic abnormality in the RIGHT axilla at the site of focal
pain. The Port-A-Cath port underlies this location.

Biopsy-proven fibroadenoma associated with the cylinder shaped
tissue marking clip in the slight upper outer retroareolar location
at anterior depth.

No mammographic abnormality in the upper breast at posterior depth,
near 12 o'clock location associated with the barbell tissue marking
clip, placed at the site of non-mass enhancement at the time of
prior MR biopsy. As mentioned, the non-mass enhancement was not
biopsied at that time due to a malfunction in the biopsy device.

No new or suspicious findings.

Targeted ultrasound is performed at the site of focal pain in the
low axilla, demonstrating a normal axillary fat pad. There is no
mass or pathologic lymphadenopathy.
IMPRESSION: 1. No mammographic evidence of malignancy involving the RIGHT
breast.
2. No mass or pathologic lymphadenopathy in the low RIGHT axilla at
the site of focal pain.
3. No mammographic abnormality in the upper breast at posterior
depth associated with the barbell clip placed at the site of
non-mass enhancement at the time of the prior MR breast biopsy. The
non-mass enhancement was not biopsied at that time due to a
malfunction in the biopsy device.

RECOMMENDATION:
1. Screening RIGHT mammogram in 1 year.
2. Breast MRI in 6 months to follow-up the non-mass enhancement in
the upper RIGHT breast at posterior depth.

The patient and I discussed short-term interval MRI follow-up of the
non-mass enhancement versus stereotactic tomosynthesis core needle
biopsy using the barbell clip as the target for the biopsy. She
wishes to pursue the short-term interval follow-up MRI in 6 months.

I have discussed the findings and recommendations with the patient.
If applicable, a reminder letter will be sent to the patient
regarding the next appointment.

BI-RADS CATEGORY  2: Benign.

## 2021-01-25 ENCOUNTER — Encounter: Payer: Self-pay | Admitting: *Deleted

## 2021-01-27 ENCOUNTER — Other Ambulatory Visit: Payer: Self-pay

## 2021-01-27 ENCOUNTER — Ambulatory Visit: Payer: BC Managed Care – PPO | Attending: Hematology

## 2021-01-27 DIAGNOSIS — R293 Abnormal posture: Secondary | ICD-10-CM | POA: Diagnosis present

## 2021-01-27 DIAGNOSIS — C50412 Malignant neoplasm of upper-outer quadrant of left female breast: Secondary | ICD-10-CM | POA: Insufficient documentation

## 2021-01-27 DIAGNOSIS — Z170 Estrogen receptor positive status [ER+]: Secondary | ICD-10-CM | POA: Diagnosis present

## 2021-01-27 DIAGNOSIS — R600 Localized edema: Secondary | ICD-10-CM | POA: Diagnosis present

## 2021-01-27 DIAGNOSIS — M25612 Stiffness of left shoulder, not elsewhere classified: Secondary | ICD-10-CM | POA: Insufficient documentation

## 2021-01-27 NOTE — Patient Instructions (Addendum)
° ° ° ° °   [facility]  [address], [address]  [place] [address]  [phone]  After Breast Cancer Class It is recommended you attend the ABC class to be educated on lymphedema risk reduction. This class is free of charge and lasts for 1 hour. It is a 1-time class. You will need to download the Webex app either on your phone or computer. We will send you a link the night before or the morning of the class. You should be able to click on that link to join the class. This is not a confidential class. You don't have to turn your camera on, but other participants may be able to see your email address. January 16  Scar massage;not until incisions healed You can begin gentle scar massage to you incision sites. Gently place one hand on the incision and move the skin (without sliding on the skin) in various directions. Do this for a few minutes and then you can gently massage either coconut oil or vitamin E cream into the scars.  Home exercise Program Continue doing the exercises you were given until you feel like you can do them without feeling any tightness at the end.   Walking Program Studies show that 30 minutes of walking per day (fast enough to elevate your heart rate) can significantly reduce the risk of a cancer recurrence. If you can't walk due to other medical reasons, we encourage you to find another activity you could do (like a stationary bike or water exercise).  Posture After breast cancer surgery, people frequently sit with rounded shoulders posture because it puts their incisions on slack and feels better. If you sit like this and scar tissue forms in that position, you can become very tight and have pain sitting or standing with good posture. Try to be aware of your posture and sit and stand up tall to heal properly.  Follow up PT: It is recommended you return every 3 months for the first 3 years following surgery to be assessed on the SOZO machine for an  L-Dex score. This helps prevent clinically significant lymphedema in 95% of patients. These follow up screens are 10 minute appointments that you are not billed for.

## 2021-01-27 NOTE — Therapy (Signed)
[facility] @ [facility] [place], [place], [address] Phone: [phone]   Fax:  [phone]  Physical Therapy Evaluation  Patient Details  Name: Pamela Reyes MRN: [id number] Date of Birth: [birth year] Referring Provider (PT): Dr. Burr Medico   Encounter Date: 01/27/2021   PT End of Session - 01/27/21 0956     Visit Number 2    Number of Visits 8    Date for PT Re-Evaluation 03/10/21    PT Start Time 0904    PT Stop Time 0948    PT Time Calculation (min) 44 min    Activity Tolerance Patient tolerated treatment well    Behavior During Therapy [facility] for tasks assessed/performed             Past Medical History:  Diagnosis Date   Anxiety    Breast cancer ([place])    Family history of breast cancer 11/11/2020   Fibroid 10/27/2020   Has 1 x .9 x .6 cm subserosal fibroid   History of hiatal hernia     Past Surgical History:  Procedure Laterality Date   BREAST RECONSTRUCTION WITH PLACEMENT OF TISSUE EXPANDER AND FLEX HD (ACELLULAR HYDRATED DERMIS) Left 01/06/2021   Procedure: LEFT BREAST RECONSTRUCTION WITH PLACEMENT OF TISSUE EXPANDER AND ACELLULAR  DERMIS;  Surgeon: Irene Limbo, MD;  Location: [place];  Service: Plastics;  Laterality: Left;   MASTECTOMY W/ SENTINEL NODE BIOPSY Left 01/06/2021   Procedure: LEFT MASTECTOMY WITH MAGTRACE;  Surgeon: Stark Klein, MD;  Location: [place];  Service: General;  Laterality: Left;   PORTACATH PLACEMENT Right 01/19/2021   Procedure: INSERTION PORT-A-CATH;  Surgeon: Stark Klein, MD;  Location: [place];  Service: General;  Laterality: Right;   RADIOACTIVE SEED GUIDED AXILLARY SENTINEL LYMPH NODE Left 01/06/2021   Procedure: RADIOACTIVE SEED GUIDED SENTINEL LYMPH NODE BIOPSY;  Surgeon: Stark Klein, MD;  Location: [place];  Service: General;  Laterality: Left;   SENTINEL NODE BIOPSY Left 01/06/2021   Procedure: LEFT AXILLARY SENTINEL LYMPH  NODE BIOPSY;  Surgeon: Stark Klein, MD;   Location: [place];  Service: General;  Laterality: Left;   WISDOM TOOTH EXTRACTION      There were no vitals filed for this visit.    Subjective Assessment - 01/27/21 0903     Subjective Drains came out last Wednesday. Plastic surgeon said to wait until her follow up to be more aggressive but she has been doing all of the post op exercises since last week.Incision seems to be healing up well.    Pertinent History Diagnosed 10/29/2020 and found by self exam. Biopsy revealed IDC ER, PR +, HER 2 -.  Pt underwent left mastectomy on 11/30/200 with expander. 4+/5 LN.  She will be having chemotherapy in January.    Patient Stated Goals Post-op reassessment    Currently in Pain? Yes    Pain Score 2     Pain Location Axilla    Pain Orientation Left    Pain Descriptors / Indicators Tightness;Aching;Burning    Pain Type Surgical pain    Pain Onset More than a month ago    Pain Frequency Intermittent    Aggravating Factors  using left UE    Pain Relieving Factors rest    Effect of Pain on Daily Activities decreased reaching/home activities                [facility] PT Assessment - 01/27/21 0001       Assessment   Medical Diagnosis Left  Breast Cancer    Referring Provider (PT) Dr. Burr Medico    Onset Date/Surgical Date 11/06/20    Hand Dominance Right      Precautions   Precaution Comments lymphedema risk      Balance Screen   Has the patient fallen in the past 6 months No    Has the patient had a decrease in activity level because of a fear of falling?  No    Is the patient reluctant to leave their home because of a fear of falling?  No      Prior Function   Level of Independence Independent      Cognition   Overall Cognitive Status Within Functional Limits for tasks assessed      Observation/Other Assessments   Observations mild left axillary swelling, incisions healing with scabs      AROM   Left Shoulder Extension 50 Degrees    Left Shoulder Flexion 131 Degrees    Left Shoulder  ABduction 125 Degrees    Left Shoulder Internal Rotation 55 Degrees    Left Shoulder External Rotation 94 Degrees               LYMPHEDEMA/ONCOLOGY QUESTIONNAIRE - 01/27/21 0001       Type   Cancer Type IDC      Surgeries   Mastectomy Date 01/06/21    Sentinel Lymph Node Biopsy Date 01/06/21    Number Lymph Nodes Removed 5   4+     Treatment   Active Chemotherapy Treatment --   starting in January   Past Chemotherapy Treatment No    Active Radiation Treatment --   will follow chemo   Current Hormone Treatment --   will take tamoxifen after radiation     What other symptoms do you have   Are you Having Heaviness or Tightness Yes    Are you having Pain Yes   yes, axillary region   Are you having pitting edema No    Is it Hard or Difficult finding clothes that fit No    Do you have infections No    Is there Decreased scar mobility No      Left Upper Extremity Lymphedema   15 cm Proximal to Olecranon Process 25.6 cm    10 cm Proximal to Olecranon Process 24 cm    Olecranon Process 22 cm    15 cm Proximal to Ulnar Styloid Process 21.5 cm    10 cm Proximal to Ulnar Styloid Process 19.5 cm    Just Proximal to Ulnar Styloid Process 15.1 cm    At Base of 2nd Digit 6.2 cm                   Quick Dash - 01/27/21 0001     Open a tight or new jar No difficulty    Do heavy household chores (wash walls, wash floors) Moderate difficulty    Carry a shopping bag or briefcase No difficulty    Wash your back Mild difficulty    Use a knife to cut food No difficulty    Recreational activities in which you take some force or impact through your arm, shoulder, or hand (golf, hammering, tennis) Mild difficulty    During the past week, to what extent has your arm, shoulder or hand problem interfered with your normal social activities with family, friends, neighbors, or groups? Not at all    During the past week, to what extent has your arm, shoulder or hand  problem limited your  work or other regular daily activities Slightly    Arm, shoulder, or hand pain. Mild    Tingling (pins and needles) in your arm, shoulder, or hand Mild    Difficulty Sleeping Mild difficulty    DASH Score 18.18 %              Objective measurements completed on examination: See above findings.                     PT Long Term Goals - 01/27/21 1223       PT LONG TERM GOAL #1   Title Pt will restore left shoulder ROM to pre surgery levels or within 5-10 degrees of pre-surgery levels    Time 6    Period Months    Status On-going    Target Date 03/10/21      PT LONG TERM GOAL #2   Title Pt will be independent in MLD prn to decrease left axillary swelling    Time 6    Period Weeks    Status New    Target Date 03/10/21      PT LONG TERM GOAL #3   Title Pt will be fit for compression sleeve/gauntlet for prophylactic reasons                    Plan - 01/27/21 0957     Clinical Impression Statement Pt returns for post op visit.  She is doing well overall with some limitations in left shoulder AROM.  THere is mild left axillary swelling. There is no edema in the left UE. Incisions are healing with scab and drain site has slight yellowish appearance, She is not yet ready for scar massage. She will see her plastic surgeon today and have it checked. We reviewed post op exercises and I showed her how to do supine flexion and stargazer in supine which was much easier for her and more tolerable on her right shoulder that has chronic pain.  She was set up for her SOZO screen, ABC class, and 1 follow up visit with me in January to recheck ROM and mild swelling.    Personal Factors and Comorbidities Comorbidity 1    Comorbidities intermittent Right shoulder pain    Stability/Clinical Decision Making Stable/Uncomplicated    Rehab Potential Excellent    PT Frequency 1x / week    PT Duration 6 weeks    PT Treatment/Interventions ADLs/Self Care Home  Management;Therapeutic exercise;Neuromuscular re-education;Manual techniques;Orthotic Fit/Training;Patient/family education;Manual lymph drainage;Scar mobilization;Passive range of motion    PT Next Visit Plan check shoulder ROM, swelling at axilla and instruct MLD prn,, give script for compression sleeve/gauntlet, STM prn, supine scap series?more visits prn    PT Home Exercise Plan 4 post op exercises    Recommended Other Services ABC Jan 16, SOZO end of Feb, follow up here in Jan.    Consulted and Agree with Plan of Care Patient             Patient will benefit from skilled therapeutic intervention in order to improve the following deficits and impairments:  Decreased knowledge of precautions, Postural dysfunction, Decreased strength, Pain, Decreased range of motion, Increased edema  Visit Diagnosis: Malignant neoplasm of upper-outer quadrant of left breast in female, estrogen receptor positive (HCC)  Abnormal posture  Stiffness of left shoulder, not elsewhere classified  Localized edema     Problem List Patient Active Problem List   Diagnosis Date Noted  Genetic testing 11/25/2020   Family history of breast cancer 11/11/2020   Malignant neoplasm of upper-outer quadrant of left breast in female, estrogen receptor positive ([place]) 11/10/2020   Fibroid 10/27/2020   LLQ pain 10/06/2020   Left breast mass 10/06/2020   Breast pain, left 10/06/2020    Claris Pong, PT 01/27/2021, 12:28 PM  [facility] @ [facility] [place], [place], [address] Phone: [phone]   Fax:  [phone]  Name: HOLLI RENGEL MRN: [id number] Date of Birth: 1980/09/24

## 2021-02-02 ENCOUNTER — Telehealth: Payer: Self-pay

## 2021-02-02 NOTE — Telephone Encounter (Signed)
[id number] - TREATMENT OF REFRACTORY NAUSEA   Called Ms Beebe to confirm appt with my colleage Cameo Windham for consents on Thursday. No answer, left voicemail with contact information for research.  Marjie Skiff Matthan Sledge, RN, BSN, [facility]   Her   [facility] Clinical Research Nurse [place] [phone]   Pager [phone] 02/02/2021 4:38 PM

## 2021-02-03 NOTE — Progress Notes (Signed)
Pharmacist Chemotherapy Monitoring - Initial Assessment    Anticipated start date: 02/11/21   The following has been reviewed per standard work regarding the patient's treatment regimen: The patient's diagnosis, treatment plan and drug doses, and organ/hematologic function Lab orders and baseline tests specific to treatment regimen  The treatment plan start date, drug sequencing, and pre-medications Prior authorization status  Patient's documented medication list, including drug-drug interaction screen and prescriptions for anti-emetics and supportive care specific to the treatment regimen The drug concentrations, fluid compatibility, administration routes, and timing of the medications to be used The patient's access for treatment and lifetime cumulative dose history, if applicable  The patient's medication allergies and previous infusion related reactions, if applicable   Changes made to treatment plan:  N/A  Follow up needed:  N/A  Benn Moulder, PharmD Pharmacy Resident  02/03/2021 2:32 PM

## 2021-02-04 ENCOUNTER — Encounter: Payer: Self-pay | Admitting: Nurse Practitioner

## 2021-02-04 ENCOUNTER — Inpatient Hospital Stay: Payer: BC Managed Care – PPO

## 2021-02-04 ENCOUNTER — Other Ambulatory Visit: Payer: Self-pay

## 2021-02-04 ENCOUNTER — Inpatient Hospital Stay: Payer: BC Managed Care – PPO | Admitting: Emergency Medicine

## 2021-02-04 ENCOUNTER — Ambulatory Visit (HOSPITAL_COMMUNITY)
Admission: RE | Admit: 2021-02-04 | Discharge: 2021-02-04 | Disposition: A | Discharge status: Home / Self Care | Payer: BC Managed Care – PPO | Source: Ambulatory Visit | Attending: Hematology | Admitting: Hematology

## 2021-02-04 ENCOUNTER — Other Ambulatory Visit: Payer: Self-pay | Admitting: Emergency Medicine

## 2021-02-04 DIAGNOSIS — Z170 Estrogen receptor positive status [ER+]: Secondary | ICD-10-CM

## 2021-02-04 DIAGNOSIS — Z0189 Encounter for other specified special examinations: Secondary | ICD-10-CM

## 2021-02-04 DIAGNOSIS — Z01818 Encounter for other preprocedural examination: Secondary | ICD-10-CM | POA: Diagnosis not present

## 2021-02-04 DIAGNOSIS — C50412 Malignant neoplasm of upper-outer quadrant of left female breast: Secondary | ICD-10-CM | POA: Insufficient documentation

## 2021-02-04 LAB — CBC WITH DIFFERENTIAL (CANCER CENTER ONLY)
Abs Immature Granulocytes: 0.02 10*3/uL (ref 0.00–0.07)
Basophils Absolute: 0 10*3/uL (ref 0.0–0.1)
Basophils Relative: 1 %
Eosinophils Absolute: 0.1 10*3/uL (ref 0.0–0.5)
Eosinophils Relative: 2 %
HCT: 41.8 % (ref 36.0–46.0)
Hemoglobin: 14 g/dL (ref 12.0–15.0)
Immature Granulocytes: 0 %
Lymphocytes Relative: 36 %
Lymphs Abs: 2 10*3/uL (ref 0.7–4.0)
MCH: 30 pg (ref 26.0–34.0)
MCHC: 33.5 g/dL (ref 30.0–36.0)
MCV: 89.7 fL (ref 80.0–100.0)
Monocytes Absolute: 0.3 10*3/uL (ref 0.1–1.0)
Monocytes Relative: 6 %
Neutro Abs: 3.1 10*3/uL (ref 1.7–7.7)
Neutrophils Relative %: 55 %
Platelet Count: 278 10*3/uL (ref 150–400)
RBC: 4.66 MIL/uL (ref 3.87–5.11)
RDW: 13.2 % (ref 11.5–15.5)
WBC Count: 5.6 10*3/uL (ref 4.0–10.5)
nRBC: 0 % (ref 0.0–0.2)

## 2021-02-04 LAB — CMP (CANCER CENTER ONLY)
ALT: 12 U/L (ref 0–44)
AST: 13 U/L — ABNORMAL LOW (ref 15–41)
Albumin: 4.7 g/dL (ref 3.5–5.0)
Alkaline Phosphatase: 33 U/L — ABNORMAL LOW (ref 38–126)
Anion gap: 9 (ref 5–15)
BUN: 9 mg/dL (ref 6–20)
CO2: 28 mmol/L (ref 22–32)
Calcium: 10.1 mg/dL (ref 8.9–10.3)
Chloride: 103 mmol/L (ref 98–111)
Creatinine: 0.67 mg/dL (ref 0.44–1.00)
GFR, Estimated: >60 mL/min (ref >60)
Glucose, Bld: 88 mg/dL (ref 70–99)
Potassium: 4.1 mmol/L (ref 3.5–5.1)
Sodium: 140 mmol/L (ref 135–145)
Total Bilirubin: 0.5 mg/dL (ref 0.3–1.2)
Total Protein: 7.9 g/dL (ref 6.5–8.1)

## 2021-02-04 LAB — ECHOCARDIOGRAM COMPLETE
Area-P 1/2: 3.66 cm2
S' Lateral: 2.7 cm

## 2021-02-04 LAB — RESEARCH LABS

## 2021-02-04 LAB — PREGNANCY, URINE: Preg Test, Ur: NEGATIVE

## 2021-02-04 NOTE — Progress Notes (Signed)
Met with patient at registration to introduce myself as Arboriculturist and to offer available resources.  Discussed one-time $1000 Radio broadcast assistant to assist with personal expenses while going through treatment.  Also, gave [employer] application if interested who assists patients whom live in [place] only.  Discussed available copay assistance if needed if insurance renews in January.  She has my card for any additional financial questions or concerns.

## 2021-02-04 NOTE — Research (Signed)
DCP-001: Use of a Clinical Trial Screening Tool to Address Cancer Health Disparities in the [facility] [place])   Patient Pamela Reyes was identified by this Clinical Research Nurse as a potential candidate for the above listed study.  This Clinical Research Nurse met with Pamela Reyes, [id number], on 02/04/21 in a manner and location that ensures patient privacy to discuss participation in the above listed research study.  Patient is Accompanied by partner Gerald Stabs .  A copy of the informed consent document and separate HIPAA Authorization was provided to the patient.  Patient reads, speaks, and understands [place].   Patient was provided with the business card of this Nurse and encouraged to contact the research team with any questions.  Approximately 67mnutes were spent with the patient reviewing the informed consent documents.  Patient was provided the option of taking informed consent documents home to review and was encouraged to review at their convenience with their support network, including other care providers. Patient took the consent documents home to review.  Will speak with patient at next appt regarding interest.  RWells Guiles'LDonell Sievert RN, BSN Clinical Research Nurse I 02/04/21 2:06 PM

## 2021-02-04 NOTE — Research (Signed)
[id number] - TREATMENT OF REFRACTORY NAUSEA  REGISTRATION  Patient Pamela Reyes, was registered to the above listed study, assigned study# [id number]. Randomization is not required for Cycle 1 of this study.  Wells Guiles 'Learta CoddingNeysa Bonito, RN, BSN Clinical Research Nurse I 02/04/21 3:24 PM

## 2021-02-04 NOTE — Research (Signed)
[id number] - TREATMENT OF REFRACTORY NAUSEA  CONSENT  Patient Pamela Reyes was identified by MD Burr Medico as a potential candidate for the above listed study.  This Clinical Research Nurse met with Pamela Reyes, Pamela Reyes on 02/04/21 in a manner and location that ensures patient privacy to discuss participation in the above listed research study.  Patient is Accompanied by partner Pamela Reyes .  Patient was previously provided with informed consent documents.  Patient confirmed they have read the informed consent documents.  As outlined in the informed consent form, this Nurse and Blanchie Dessert discussed the purpose of the research study, the investigational nature of the study, study procedures and requirements for study participation, potential risks and benefits of study participation, as well as alternatives to participation.  This study is blinded or double-blinded in Cycle 2. The patient understands participation is voluntary and they may withdraw from study participation at any time.  Each study arm was reviewed, and randomization discussed. (In regarding to Cycle 2)  Potential side effects were reviewed with patient as outlined in the consent form, and patient made aware there may be side effects not yet known. (Cycle 2) The chance of receiving placebo was discussed. (Cycle 2). Patient understands enrollment is pending full eligibility review.   Confidentiality and how the patient's information will be used as part of study participation were discussed.  Patient was informed there is not reimbursement provided for their time and effort spent on trial participation.  The patient is encouraged to discuss research study participation with their insurance provider to determine what costs they may incur as part of study participation, including research related injury.    All questions were answered to patient's satisfaction.  The informed consent and separate HIPAA Authorization was reviewed page by  page.  The patient's mental and emotional status is appropriate to provide informed consent, and the patient verbalizes an understanding of study participation.  Patient has agreed to participate in the above listed research study and has voluntarily signed the informed consent version date 08/24/18 ([facility] date 06/04/19) and separate HIPAA Authorization, version 04/25/17 ([facility] date 08/18/20)  on 02/04/21 at 1:19 PM.  The patient was provided with a copy of the signed informed consent form and separate HIPAA Authorization for their reference.  No study specific procedures were obtained prior to the signing of the informed consent document.  Approximately 45 minutes were spent with the patient reviewing the informed consent documents.  Patient was not requested to complete a Release of Information form.  Wells Guiles 'Learta CoddingNeysa Bonito, RN, BSN Clinical Research Nurse I 02/04/21 2:13 PM

## 2021-02-04 NOTE — Research (Signed)
[id number] - TREATMENT OF REFRACTORY NAUSEA  ELIGIBILITY CHECK  This Nurse has reviewed this patient's inclusion and exclusion criteria and confirmed Pamela Reyes is eligible for study participation.  Patient will continue with enrollment.  Menopausal status (women only): Pamela Reyes is pre-menopausal with LMP on 01/11/21.  Patient states that she utilizes at least one form of contraception and verbalizes understanding to avoid pregnancy during trial.  Patient's SOC urine pregnancy test today was negative.  Pt verbally confirmed today that she is able to give written informed consent.  Reviewed all exclusion criteria for Cycle 1 with patient who verbally denied all.  Reviewed medications, allergies, and history with patient and updated in chart.  Patient aware of lorazepam administration limitations, states she has not taken any since it was prescribed to her.  Patient had study blood biomarkers drawn today after signing consent.  Patient also had her [facility] labs that were originally due next week (02/11/21) drawn as well to prevent additional blood draw on D1C1 treatment (CMP and CBC).  Per MD Burr Medico (NP Regan Rakers on PAL) ok to draw these today and use for next week's chemotherapy admin as this is within 7 day window.  Noted in appt notes for chemo appt, lab appt cancelled for 02/11/21 as no longer necessary, pt aware.  Confirmed that patient would like to do electronic questionnaires.  Pt provided with backup Baseline paper questionnaires to complete if she does not receive them via email the day before chemo admin.  Will f/u with patient on D1C1 of chemo (02/11/21) to collect these as needed and provide her with backup Cycle 1 questionnaires and 4 day home record.  Wells Guiles 'Learta CoddingNeysa Bonito, RN, BSN Clinical Research Nurse I 02/04/21 2:48 PM

## 2021-02-04 NOTE — Research (Signed)
[id number] - TREATMENT OF REFRACTORY NAUSEA  This nurse has reviewed this patient's inclusion and exclusion criteria as a second review and confirms Pamela Reyes is eligible for study participation.  Patient may continue with enrollment.  Vickii Penna, RN, BSN, CPN Clinical Research Nurse I [phone]  02/04/2021 3:03 PM

## 2021-02-08 NOTE — Progress Notes (Signed)
[place]   [phone] [phone]   Clinic Follow up Note   Patient Care Team: Maximiano Coss, NP as PCP - General (Adult Health Nurse Practitioner) Mauro Kaufmann, RN as Oncology Nurse Navigator Rockwell Germany, RN as Oncology Nurse Navigator Stark Klein, MD as Consulting Physician (General Surgery) [name], MD as Consulting Physician (Hematology) Kyung Rudd, MD as Consulting Physician (Radiation Oncology) Estill Dooms, NP as Nurse Practitioner (Obstetrics and Gynecology) 02/11/2021  CHIEF COMPLAINT: Follow up left breast cancer   SUMMARY OF ONCOLOGIC HISTORY: Oncology History Overview Note   Cancer Staging  Malignant neoplasm of upper-outer quadrant of left breast in female, estrogen receptor positive ([place]) Staging form: Breast, AJCC 8th Edition - Clinical stage from 11/05/2020: Stage IIA (cT2, cN1, cM0, G1, ER+, PR+, HER2: Equivocal) - Signed by [name], MD on 11/10/2020 - Pathologic stage from 01/06/2021: Stage IB (pT3, pN2a, cM0, G2, ER+, PR+, HER2-) - Signed by [name], MD on 01/12/2021     Malignant neoplasm of upper-outer quadrant of left breast in female, estrogen receptor positive ([place])  10/28/2020 Mammogram   Bilateral Diagnostic Mammogram; Left Breast Ultrasound  IMPRESSION: 1. Suspicious palpable mass/hypoechoic area in the left breast at 12 o'clock retroareolar measuring 4.5 cm.   2.  Suspicious lymph node in the left axilla.   11/05/2020 Cancer Staging   Staging form: Breast, AJCC 8th Edition - Clinical stage from 11/05/2020: Stage IIA (cT2, cN1, cM0, G1, ER+, PR+, HER2: Equivocal) - Signed by [name], MD on 11/10/2020 Stage prefix: Initial diagnosis Histologic grading system: 3 grade system    11/05/2020 Pathology Results   Diagnosis 1. Breast, left, needle core biopsy, 12 o'clock subareolar, ribbon clip - INVASIVE MAMMARY CARCINOMA. SEE NOTE 1. Carcinoma measures 1.5 cm in greatest linear dimension and  appears grade 2. 1. Immunohistochemical stain for E-cadherin is positive in the tumor cells, consistent with a ductal phenotype. 1. PROGNOSTIC INDICATORS Results: The tumor cells are EQUIVOCAL for Her2 (2+). Her2 by FISH will be performed and results reported separately. Estrogen Receptor: 70%, POSITIVE, STRONG-MODERATE STAINING INTENSITY Progesterone Receptor: 80%, POSITIVE, MODERATE STAINING INTENSITY Proliferation Marker Ki67: 1%  2. Lymph node, needle/core biopsy, left axilla, tribell clip - METASTATIC CARCINOMA TO A LYMPH NODE. SEE NOTE 2. Largest contiguous focus of metastatic carcinoma measures 0.3 cm. 2. PROGNOSTIC INDICATORS Results: The tumor cells are EQUIVOCAL for Her2 (2+). Her2 by FISH will be performed and results reported separately. Estrogen Receptor: 90%, POSITIVE, STRONG STAINING INTENSITY Progesterone Receptor: 95%, POSITIVE, STRONG STAINING INTENSITY Proliferation Marker Ki67: 1%   11/10/2020 Initial Diagnosis   Malignant neoplasm of upper-outer quadrant of left breast in female, estrogen receptor positive ([place])   11/19/2020 Imaging   EXAM: CT CHEST, ABDOMEN, AND PELVIS WITH CONTRAST  IMPRESSION: 1. Left breast mass  as previously described. 2. Enhancing subcentimeter left axillary lymph nodes. 3. No evidence of distal metastatic disease. 4. Small hiatal hernia   11/19/2020 Imaging   EXAM: NUCLEAR MEDICINE WHOLE BODY BONE SCAN  IMPRESSION: No evidence of metastatic disease.   11/21/2020 Imaging   EXAM: BILATERAL BREAST MRI WITH AND WITHOUT CONTRAST  IMPRESSION: 1. Suspicious 1.3 centimeter mass in the anterior UPPER central RIGHT breast warranting tissue diagnosis. (Image 65 of series 7). 2. 4 millimeter possible satellite nodule posterior to the 1.3 centimeter mass in the RIGHT breast. (Image 63 of series 7). 3. Indeterminate oval mass in the UPPER central middle depth of the RIGHT breast warranting tissue diagnosis. (Image 60 of series  7). 4.  Large area of enhancement in the central portion of the LEFT breast, involving all quadrants. There is associated enhancement and retraction of the LEFT nipple, with significantly smaller size of the LEFT breast. Enhancement spans at least 6.5 centimeters. 5. Enlarged, previously biopsied LEFT axillary lymph node.   11/24/2020 Genetic Testing   Negative hereditary cancer genetic testing: no pathogenic variants detected in [facility] BRCAPlus STAT Panel or [facility] CustomNext-Cancer +RNAinsight Panel.  The report dates are November 24, 2020 and November 27, 2020, respectively.   The BRCAplus panel offered by [employer] and includes sequencing and deletion/duplication analysis for the following 8 genes: ATM, BRCA1, BRCA2, CDH1, CHEK2, PALB2, PTEN, and TP53.  The CustomNext-Cancer+RNAinsight panel offered by [facility] includes sequencing and rearrangement analysis for the following 47 genes:  APC, ATM, AXIN2, BARD1, BMPR1A, BRCA1, BRCA2, BRIP1, CDH1, CDK4, CDKN2A, CHEK2, DICER1, EPCAM, GREM1, HOXB13, MEN1, MLH1, MSH2, MSH3, MSH6, MUTYH, NBN, NF1, NF2, NTHL1, PALB2, PMS2, POLD1, POLE, PTEN, RAD51C, RAD51D, RECQL, RET, SDHA, SDHAF2, SDHB, SDHC, SDHD, SMAD4, SMARCA4, STK11, TP53, TSC1, TSC2, and VHL.  RNA data is routinely analyzed for use in variant interpretation for all genes.    11/27/2020 Pathology Results   Diagnosis Breast, right, needle core biopsy, anterior upper central - FIBROADENOMATOID AND FIBROCYSTIC CHANGES WITH CALCIFICATIONS - PSEUDOANGIOMATOUS STROMAL HYPERPLASIA - FOCAL PERIDUCTULAR CHRONIC INFLAMMATION - NO MALIGNANCY IDENTIFIED   01/06/2021 Cancer Staging   Staging form: Breast, AJCC 8th Edition - Pathologic stage from 01/06/2021: Stage IB (pT3, pN2a, cM0, G2, ER+, PR+, HER2-) - Signed by [name], MD on 01/12/2021 Stage prefix: Initial diagnosis Histologic grading system: 3 grade system Residual tumor (R): R0 - None    01/06/2021 Definitive Surgery   FINAL MICROSCOPIC  DIAGNOSIS:   A. LYMPH NODE, LEFT AXILLARY, SENTINEL, EXCISION:  - Metastatic carcinoma to a lymph node (1/1)  - Focus of metastatic carcinoma measures 1.5 cm without evidence of extranodal extension   B. 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 BREAST, LEFT INFERIOLATERAL MARGIN, EXCISION:  - Benign fibroadipose tissue  - Negative for carcinoma   D. LYMPH NODE, LEFT AXILLARY #1, SENTINEL, EXCISION:  - Metastatic carcinoma to a lymph node (1/1)  - Focus of metastatic carcinoma measures 1.4 cm without evidence of extranodal extension   E. LYMPH NODE, LEFT AXILLARY #2, SENTINEL, EXCISION:  - Metastatic carcinoma to a lymph node (1/1)  - Focus of metastatic carcinoma measures 0.5 cm without evidence of extranodal extension   F. LYMPH NODE, LEFT AXILLARY #3, SENTINEL, EXCISION:  - Metastatic carcinoma to a lymph node (1/1)  - Focus of metastatic carcinoma measures 0.9 cm without evidence of extranodal extension   G. LYMPH NODE, LEFT AXILLARY #4, SENTINEL, EXCISION:  - Benign fibroadipose tissue, negative for carcinoma  - Lymphoid tissue is not identified    02/11/2021 -  Chemotherapy   Patient is on Treatment Plan : BREAST ADJUVANT DOSE DENSE AC q14d / PACLitaxel q7d       CURRENT THERAPY: PENDING adjuvant chemo AC-T starting 02/11/21  INTERVAL HISTORY: Ms. Karwowski returns for follow up as scheduled. Last seen by med onc 01/13/21. She has been seeing plastics for expansion in the interim. She underwent PAC placement 01/20/21, echo, and chemo ed session. Also had PAP/pelvic yesterday to get that out  of the way. She is feeling well in general, anxious about the effects of chemo. She had a scab pulled off left breast incision, applying aquaphor and triple antibiotic ointment periodically. No pain,  drainage, redness, fever, chills. She denies any other specific complaints.    MEDICAL HISTORY:  Past Medical History:  Diagnosis Date   Anxiety    Breast cancer ([place])    Family history of breast cancer 11/11/2020   Fibroid 10/27/2020   Has 1 x .9 x .6 cm subserosal fibroid   History of hiatal hernia     SURGICAL HISTORY: Past Surgical History:  Procedure Laterality Date   BREAST RECONSTRUCTION WITH PLACEMENT OF TISSUE EXPANDER AND FLEX HD (ACELLULAR HYDRATED DERMIS) Left 01/06/2021   Procedure: LEFT BREAST RECONSTRUCTION WITH PLACEMENT OF TISSUE EXPANDER AND ACELLULAR  DERMIS;  Surgeon: Irene Limbo, MD;  Location: [facility];  Service: Plastics;  Laterality: Left;   MASTECTOMY W/ SENTINEL NODE BIOPSY Left 01/06/2021   Procedure: LEFT MASTECTOMY WITH MAGTRACE;  Surgeon: Stark Klein, MD;  Location: [facility];  Service: General;  Laterality: Left;   PORTACATH PLACEMENT Right 01/19/2021   Procedure: INSERTION PORT-A-CATH;  Surgeon: Stark Klein, MD;  Location: [facility];  Service: General;  Laterality: Right;   RADIOACTIVE SEED GUIDED AXILLARY SENTINEL LYMPH NODE Left 01/06/2021   Procedure: RADIOACTIVE SEED GUIDED SENTINEL LYMPH NODE BIOPSY;  Surgeon: Stark Klein, MD;  Location: [facility];  Service: General;  Laterality: Left;   SENTINEL NODE BIOPSY Left 01/06/2021   Procedure: LEFT AXILLARY SENTINEL LYMPH  NODE BIOPSY;  Surgeon: Stark Klein, MD;  Location: [facility];  Service: General;  Laterality: Left;   WISDOM TOOTH EXTRACTION      I have reviewed the social history and family history with the patient and they are unchanged from previous note.  ALLERGIES:  has No Known Allergies.  MEDICATIONS:  Current Outpatient Medications  Medication Sig Dispense Refill   acetaminophen (TYLENOL) 500 MG tablet Take 500-1,000 mg by mouth every 6 (six) hours as needed for moderate pain or headache.     Cholecalciferol (DIALYVITE VITAMIN D 5000) 125 MCG (5000 UT) capsule Take 5,000 Units by mouth  daily. Vit D and K supplement combined     dexamethasone (DECADRON) 4 MG tablet Take 1 tablet (4 mg total) by mouth daily. Take daily for 3 days after chemo. Take with food. 20 tablet 0   diphenhydrAMINE (BENADRYL) 25 MG tablet Take 25 mg by mouth at bedtime as needed for sleep.     famotidine (PEPCID) 10 MG tablet Take 10 mg by mouth daily as needed for heartburn or indigestion.     lidocaine-prilocaine (EMLA) cream Apply to affected area once 30 g 3   LORazepam (ATIVAN) 0.5 MG tablet Take 1 tablet (0.5 mg total) by mouth every 6 (six) hours as needed (Nausea or vomiting). 20 tablet 0   ondansetron (ZOFRAN) 8 MG tablet Take 1 tablet (8 mg total) by mouth 2 (two) times daily as needed. Start on the third day after chemotherapy. 30 tablet 1   prochlorperazine (COMPAZINE) 10 MG tablet Take 1 tablet (10 mg total) by mouth every 6 (six) hours as needed (Nausea or vomiting). 30 tablet 1   vitamin C (ASCORBIC ACID) 500 MG tablet Take 500 mg by mouth daily.     No current facility-administered medications for this visit.   Facility-Administered Medications Ordered in Other Visits  Medication Dose Route Frequency Provider Last Rate Last Admin   cyclophosphamide (CYTOXAN) 980 mg in sodium chloride  0.9 % 250 mL chemo infusion  600 mg/m2 (Treatment Plan Recorded) Intravenous Once [name], MD       DOXOrubicin (ADRIAMYCIN) chemo injection 98 mg  60 mg/m2 (Treatment Plan Recorded) Intravenous Once [name], MD       heparin lock flush 100 unit/mL  500 Units Intracatheter Once PRN [name], MD       sodium chloride flush (NS) 0.9 % injection 10 mL  10 mL Intracatheter PRN [name], MD        PHYSICAL EXAMINATION: ECOG PERFORMANCE STATUS: 0 - Asymptomatic  Vitals:   02/11/21 0910  BP: 118/65  Pulse: 93  Resp: 18  Temp: 98.4 F (36.9 C)  SpO2: 100%   Filed Weights   02/11/21 0910  Weight: 132 lb 8 oz (60.1 kg)    GENERAL:alert, no distress and comfortable SKIN: no rash  EYES: sclera  clear LUNGS: with normal breathing effort HEART:  no lower extremity edema NEURO: alert & oriented x 3 with fluent speech, no focal motor/sensory deficits PAC without erythema Breast : see image    LABORATORY DATA:  I have reviewed the data as listed CBC Latest Ref Rng & Units 02/04/2021 01/04/2021 11/11/2020  WBC 4.0 - 10.5 K/uL 5.6 5.6 4.5  Hemoglobin 12.0 - 15.0 g/dL 14.0 14.5 14.5  Hematocrit 36.0 - 46.0 % 41.8 43.5 42.4  Platelets 150 - 400 K/uL 278 246 238     CMP Latest Ref Rng & Units 02/04/2021 11/11/2020 10/23/2020  Glucose 70 - 99 mg/dL 88 124(H) 86  BUN 6 - 20 mg/dL '9 16 16  ' Creatinine 0.44 - 1.00 mg/dL 0.67 0.82 0.72  Sodium 135 - 145 mmol/L 140 141 138  Potassium 3.5 - 5.1 mmol/L 4.1 3.9 4.3  Chloride 98 - 111 mmol/L 103 107 103  CO2 22 - 32 mmol/L '28 23 22  ' Calcium 8.9 - 10.3 mg/dL 10.1 9.3 9.6  Total Protein 6.5 - 8.1 g/dL 7.9 7.3 7.2  Total Bilirubin 0.3 - 1.2 mg/dL 0.5 0.4 0.4  Alkaline Phos 38 - 126 U/L 33(L) 26(L) 24(L)  AST 15 - 41 U/L 13(L) 13(L) 10  ALT 0 - 44 U/L '12 12 9      ' RADIOGRAPHIC STUDIES: I have personally reviewed the radiological images as listed and agreed with the findings in the report. No results found.   ASSESSMENT & PLAN: EZME DUCH is a 41 y.o. female with    1. 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 The mass has grown significantly since 2017. Biopsy on 11/05/20 confirmed invasive ductal carcinoma, with metastatic involvement of one lymph node. -staging CT CAP and bone scan on 11/19/20 showed no distant metastasis. -she underwent left mastectomy on 01/06/21 under Dr. Barry Dienes. Pathology showed 6.5 cm invasive ductal carcinoma, grade 2, involving dermis of nipple without epidermal involvement. Margins were negative, but all 4 lymph nodes showed  metastatic disease. -Due to the large tumor and 4 positive lymph nodes, she is at high risk for recurrence. ALND was discussed, the tentative plan is to do that after adjuvant chemo.  -She consented to AC-T. We reviewed potential side effects, symptom management, red flags to call/return, and I answered her questions.  -Ms. Podgorski appears well. She has fresh granulation tissue to left breast incision. No signs of infection. I recommend triple antibiotic ointment BID and monitor closely.  She knows to watch for wound opening or signs of infection. -baseline echo is normal, EF 60-65% with normal GLS. Urine preg negative and labs from 02/04/21 adequate for today's treatment. She will proceed with C1 AC today as planned, GCSF on day 3 -she will return 1/12 for toxicity check as scheduled, or sooner if needed.  -She is on study for refractory nausea, followed by Donell Sievert, RN   2. Genetics -given her young age and strong family history of breast cancer, we recommend genetic testing. -She reports her sister, who was diagnosed at age 54 and has since recovered, had negative genetic testing. -results were negative.   3. Anxiety and stress -We will reach out to our social worker and chaplain for support and counseling -she is ok today, ready to begin but anxious about how she will feel in the next few days. Validated feelings.  4. Health maintenance -She had pap/pelvic yesterday 02/10/21 -she takes vitamin C, D, B complex, and pre/probiotic for GI health. Ok to continue  -cryotherapy with chemo  Disposition:  Ms. Dimario appears well. Healing left mastectomy incision without signs of infection, continue triple antibiotic ointment BID and monitor. Recent labs reviewed. She is ready to begin adjuvant chemo AC-T starting today. We again reviewed the potential side effects, symptom management, red flags/call/return precautions, and treatment course.   She will return for GCSF on 1/7, toxicity check 1/12,  and f/up with next cycle in 2 weeks. She knows to call sooner if needed, we reviewed ways to get in touch with our clinic.   Other questions answered. Breast image and plan reviewed with Dr. Burr Medico.   No barriers to learning was detected. I spent 20 minutes counseling the patient face to face. The total time spent in the appointment was 30 minutes and more than 50% was on counseling, review of test results, and coordination of care.      Alla Feeling, NP 02/11/21

## 2021-02-10 ENCOUNTER — Ambulatory Visit: Payer: BC Managed Care – PPO | Attending: Hematology

## 2021-02-10 ENCOUNTER — Other Ambulatory Visit: Payer: Self-pay

## 2021-02-10 ENCOUNTER — Encounter: Payer: Self-pay | Admitting: Adult Health

## 2021-02-10 ENCOUNTER — Other Ambulatory Visit (HOSPITAL_COMMUNITY)
Admission: RE | Admit: 2021-02-10 | Discharge: 2021-02-10 | Disposition: A | Discharge status: Home / Self Care | Payer: BC Managed Care – PPO | Source: Ambulatory Visit | Attending: Adult Health | Admitting: Adult Health

## 2021-02-10 ENCOUNTER — Ambulatory Visit (INDEPENDENT_AMBULATORY_CARE_PROVIDER_SITE_OTHER): Payer: BC Managed Care – PPO | Admitting: Adult Health

## 2021-02-10 VITALS — BP 111/69 | HR 92 | Ht 64.0 in | Wt 131.8 lb

## 2021-02-10 DIAGNOSIS — Z170 Estrogen receptor positive status [ER+]: Secondary | ICD-10-CM

## 2021-02-10 DIAGNOSIS — R293 Abnormal posture: Secondary | ICD-10-CM | POA: Insufficient documentation

## 2021-02-10 DIAGNOSIS — C50412 Malignant neoplasm of upper-outer quadrant of left female breast: Secondary | ICD-10-CM

## 2021-02-10 DIAGNOSIS — M25612 Stiffness of left shoulder, not elsewhere classified: Secondary | ICD-10-CM | POA: Diagnosis present

## 2021-02-10 DIAGNOSIS — Z01419 Encounter for gynecological examination (general) (routine) without abnormal findings: Secondary | ICD-10-CM

## 2021-02-10 DIAGNOSIS — R600 Localized edema: Secondary | ICD-10-CM | POA: Diagnosis present

## 2021-02-10 DIAGNOSIS — Z1211 Encounter for screening for malignant neoplasm of colon: Secondary | ICD-10-CM | POA: Diagnosis not present

## 2021-02-10 HISTORY — DX: Encounter for screening for malignant neoplasm of colon: Z12.11

## 2021-02-10 HISTORY — DX: Encounter for gynecological examination (general) (routine) without abnormal findings: Z01.419

## 2021-02-10 LAB — HEMOCCULT GUIAC POC 1CARD (OFFICE): Fecal Occult Blood, POC: NEGATIVE

## 2021-02-10 MED FILL — Dexamethasone Sodium Phosphate Inj 100 MG/10ML: INTRAMUSCULAR | Qty: 1 | Status: AC

## 2021-02-10 MED FILL — Fosaprepitant Dimeglumine For IV Infusion 150 MG (Base Eq): INTRAVENOUS | Qty: 5 | Status: AC

## 2021-02-10 NOTE — Progress Notes (Signed)
Patient ID: Pamela Reyes, female   DOB: 12-27-1980, 41 y.o.   MRN: [id number] History of Present Illness: Pamela Reyes is a 41 year old white female,with DP, G0P0, in for a well woman gyn exam and pap. She was first seen i8/30/22 for breast pain and had breast cancer has had left mastectomy and has expander in now, she ill be getting chemo and radiation. She had negative genetic testing. PCP is Pamela Coss NP    Current Medications, Allergies, Past Medical History, Past Surgical History, Family History and Social History were reviewed in [employer] record.     Review of Systems: Patient denies any headaches, hearing loss, fatigue, blurred vision, shortness of breath, chest pain, abdominal pain, problems with bowel movements, urination, or intercourse. No joint pain or mood swings.     Physical Exam:BP 111/69 (BP Location: Right Arm, Patient Position: Sitting, Cuff Size: Normal)    Pulse 92    Ht 5\' 4"  (1.626 m)    Wt 131 lb 12.8 oz (59.8 kg)    LMP 02/02/2021 (Exact Date)    BMI 22.62 kg/m   General:  Well developed, well nourished, no acute distress Skin:  Warm and dry Neck:  Midline trachea, normal thyroid, good ROM, no lymphadenopathy Lungs; Clear to auscultation bilaterally Breast:  No dominant palpable mass, retraction, or nipple discharge, on the right, on left expander in place, scar still healing  Cardiovascular: Regular rate and rhythm Abdomen:  Soft, non tender, no hepatosplenomegaly Pelvic:  External genitalia is normal in appearance, no lesions.  The vagina is normal in appearance. Urethra has no lesions or masses. The cervix is smooth., pap with HR HPV genotyping performed.Marland Kitchen  Uterus is felt to be normal size, shape, and contour.  No adnexal masses or tenderness noted.Bladder is non tender, no masses felt. Rectal: Good sphincter tone, no polyps, or hemorrhoids felt.  Hemoccult negative. Extremities/musculoskeletal:  No swelling or varicosities noted, no  clubbing or cyanosis Psych:  No mood changes, alert and cooperative,seems happy AA is 0 Fall risk is low Depression screen [facility] 2/9 02/10/2021 10/23/2020 10/06/2020  Decreased Interest 0 0 0  Down, Depressed, Hopeless 1 0 0  PHQ - 2 Score 1 0 0  Altered sleeping 1 0 0  Tired, decreased energy 1 0 1  Change in appetite 0 0 0  Feeling bad or failure about yourself  0 0 0  Trouble concentrating 1 0 0  Moving slowly or fidgety/restless 0 0 0  Suicidal thoughts 0 0 0  PHQ-9 Score 4 0 1  Difficult doing work/chores - Not difficult at all -    GAD 7 : Generalized Anxiety Score 02/10/2021 10/06/2020  Nervous, Anxious, on Edge 1 1  Control/stop worrying 1 1  Worry too much - different things 1 0  Trouble relaxing 1 0  Restless 0 0  Easily annoyed or irritable 1 0  Afraid - awful might happen 1 1  Total GAD 7 Score 6 3    Upstream - 02/10/21 1436       Pregnancy Intention Screening   Does the patient want to become pregnant in the next year? No    Does the patient's partner want to become pregnant in the next year? No    Would the patient like to discuss contraceptive options today? Yes      Contraception Wrap Up   Current Method Female Condom    End Method Female Condom    Contraception Counseling Provided Yes  She may have ovaries removed, but if not is interested in having tubes removed.  Examination chaperoned by Marcelino Scot RN    Impression and Plan: 1. Encounter for gynecological examination with Papanicolaou smear of cervix Pap sent Physical in 1 year Pap in 3 if normal   2. Encounter for screening fecal occult blood testing  3. Malignant neoplasm of upper-outer quadrant of left breast in female, estrogen receptor positive ([place])

## 2021-02-10 NOTE — Therapy (Signed)
[facility] @ [facility] [place], [place], [address] Phone: [phone]   Fax:  [phone]  Physical Therapy Treatment  Patient Details  Name: Pamela Reyes MRN: [id number] Date of Birth: 1980/12/21 Referring Provider (PT): Dr. Burr Medico   Encounter Date: 02/10/2021   PT End of Session - 02/10/21 1345     Visit Number 3    Number of Visits 8    Date for PT Re-Evaluation 03/10/21    PT Start Time 1300    PT Stop Time 1343    PT Time Calculation (min) 43 min    Activity Tolerance Patient tolerated treatment well    Behavior During Therapy [facility] for tasks assessed/performed             Past Medical History:  Diagnosis Date   Anxiety    Breast cancer ([place])    Family history of breast cancer 11/11/2020   Fibroid 10/27/2020   Has 1 x .9 x .6 cm subserosal fibroid   History of hiatal hernia     Past Surgical History:  Procedure Laterality Date   BREAST RECONSTRUCTION WITH PLACEMENT OF TISSUE EXPANDER AND FLEX HD (ACELLULAR HYDRATED DERMIS) Left 01/06/2021   Procedure: LEFT BREAST RECONSTRUCTION WITH PLACEMENT OF TISSUE EXPANDER AND ACELLULAR  DERMIS;  Surgeon: Irene Limbo, MD;  Location: [facility];  Service: Plastics;  Laterality: Left;   MASTECTOMY W/ SENTINEL NODE BIOPSY Left 01/06/2021   Procedure: LEFT MASTECTOMY WITH MAGTRACE;  Surgeon: Stark Klein, MD;  Location: [facility];  Service: General;  Laterality: Left;   PORTACATH PLACEMENT Right 01/19/2021   Procedure: INSERTION PORT-A-CATH;  Surgeon: Stark Klein, MD;  Location: [facility];  Service: General;  Laterality: Right;   RADIOACTIVE SEED GUIDED AXILLARY SENTINEL LYMPH NODE Left 01/06/2021   Procedure: RADIOACTIVE SEED GUIDED SENTINEL LYMPH NODE BIOPSY;  Surgeon: Stark Klein, MD;  Location: [facility];  Service: General;  Laterality: Left;   SENTINEL NODE BIOPSY Left 01/06/2021   Procedure: LEFT AXILLARY SENTINEL LYMPH  NODE BIOPSY;  Surgeon: Stark Klein, MD;   Location: [facility];  Service: General;  Laterality: Left;   WISDOM TOOTH EXTRACTION      There were no vitals filed for this visit.   Subjective Assessment - 02/10/21 1300     Subjective Starting chemo tomorrow and radiation after that.ROM is doing really well. I have a little swelling in my arm pit but I'm not sure its sweling or tissue.    Pertinent History Diagnosed 10/29/2020 and found by self exam. Biopsy revealed IDC ER, PR +, HER 2 -.  Pt underwent left mastectomy on 11/30/200 with expander. 4+/5 LN.  She will be having chemotherapy in January.    Currently in Pain? Yes    Pain Score 3     Pain Location Axilla    Pain Orientation Left    Pain Descriptors / Indicators Aching    Pain Type Surgical pain    Pain Onset More than a month ago    Pain Frequency Intermittent    Multiple Pain Sites No                [facility] PT Assessment - 02/10/21 0001       Assessment   Medical Diagnosis Left Breast Cancer    Referring Provider (PT) Dr. Burr Medico    Onset Date/Surgical Date 11/06/20    Hand Dominance Right      Observation/Other Assessments   Observations T junction still healing slightly and pt  is using aquaphor on it. Area of apparent swelling likely more just tissue and expander related. Several very small cords noted in left axillary region but not interfering with motion      AROM   Left Shoulder Extension 55 Degrees    Left Shoulder Flexion 155 Degrees    Left Shoulder ABduction 153 Degrees    Left Shoulder External Rotation 100 Degrees               LYMPHEDEMA/ONCOLOGY QUESTIONNAIRE - 02/10/21 0001       Type   Cancer Type IDC      Surgeries   Mastectomy Date 01/06/21    Sentinel Lymph Node Biopsy Date 01/06/21    Number Lymph Nodes Removed 5   4+     Treatment   Active Chemotherapy Treatment --   starting tomorrow     Right Upper Extremity Lymphedema   15 cm Proximal to Olecranon Process 27.5 cm    10 cm Proximal to Olecranon Process 26.1 cm     Olecranon Process 23.3 cm    15 cm Proximal to Ulnar Styloid Process 22.8 cm    10 cm Proximal to Ulnar Styloid Process 20.5 cm    Just Proximal to Ulnar Styloid Process 15.4 cm    At Base of 2nd Digit 6.3 cm      Left Upper Extremity Lymphedema   15 cm Proximal to Olecranon Process 26.7 cm    10 cm Proximal to Olecranon Process 25.2 cm    Olecranon Process 22.6 cm    15 cm Proximal to Ulnar Styloid Process 21.8 cm    10 cm Proximal to Ulnar Styloid Process 19.8 cm    Just Proximal to Ulnar Styloid Process 14.9 cm    At Base of 2nd Digit 6.3 cm                                      PT Long Term Goals - 02/10/21 1339       PT LONG TERM GOAL #1   Title Pt will restore left shoulder ROM to pre surgery levels or within 5-10 degrees of pre-surgery levels    Baseline still limited for abd but improved quickly today with wall slides    Time 6    Period Months    Status Partially Met    Target Date 02/10/21      PT LONG TERM GOAL #2   Title Pt will be independent in MLD prn to decrease left axillary swelling    Time 6    Period Weeks    Status Deferred    Target Date 04/06/21      PT LONG TERM GOAL #3   Title Pt will be fit for compression sleeve/gauntlet for prophylactic reasons    Baseline given script today    Period Weeks    Status Partially Met    Target Date 02/10/21                   Plan - 02/10/21 1345     Clinical Impression Statement Pt reassessed after having 1 post op visit.  She will start chemo tomorrow.  She has returned to normal ROM except for abduction still limited.  She has recently resumed her exercises after the holiday and feels comfortable working on them independently.  She will do the ABC class on Jan. 16. Her incision is still  healing at the T junction but does not appear infected. She has 3-4 small cords in the left axillary region but not affecting her ROM.  There are no concerns about lymphedema at this time.She  has partially met most goals and MLD goal was deferred as axillary region does not appear to change in size and is likely more tissue related than swelling.  Pt feels comfortable being released to her HEP.  She was given her script to get a compression sleeve. She will continue her HEP for ROM. She knows to call with any questions or concerns.    Personal Factors and Comorbidities Comorbidity 1    Comorbidities intermittent Right shoulder pain    Stability/Clinical Decision Making Stable/Uncomplicated    Rehab Potential Excellent    PT Frequency 1x / week    PT Duration 6 weeks    PT Treatment/Interventions ADLs/Self Care Home Management;Therapeutic exercise;Neuromuscular re-education;Manual techniques;Orthotic Fit/Training;Patient/family education;Manual lymph drainage;Scar mobilization;Passive range of motion    PT Next Visit Plan DC to independent management    PT Home Exercise Plan 4 post op exercises    Consulted and Agree with Plan of Care Patient             Patient will benefit from skilled therapeutic intervention in order to improve the following deficits and impairments:  Decreased knowledge of precautions, Postural dysfunction, Decreased strength, Pain, Decreased range of motion, Increased edema  Visit Diagnosis: Malignant neoplasm of upper-outer quadrant of left breast in female, estrogen receptor positive ([facility])  Abnormal posture  Stiffness of left shoulder, not elsewhere classified  Localized edema     Problem List Patient Active Problem List   Diagnosis Date Noted   Genetic testing 11/25/2020   Family history of breast cancer 11/11/2020   Malignant neoplasm of upper-outer quadrant of left breast in female, estrogen receptor positive ([place]) 11/10/2020   Fibroid 10/27/2020   LLQ pain 10/06/2020   Left breast mass 10/06/2020   Breast pain, left 10/06/2020  PHYSICAL THERAPY DISCHARGE SUMMARY  Visits from Start of Care: 3  Current functional level related to  goals / functional outcomes: Partially met 2 goals, 1 goal defered   Remaining deficits: Mild limitation in shoulder abduction. Several small cords in left axillary region    Education / Equipment: Pt will purchase compression sleeve/gauntlet   Patient agrees to discharge. Patient goals were partially met. Patient is being discharged due to  ability to continue to work on ROM independently.   Claris Pong, PT 02/10/2021, 1:52 PM  [facility] @ [facility] [place], [place], [address] Phone: [phone]   Fax:  [phone]  Name: Pamela Reyes MRN: [id number] Date of Birth: [birth year]

## 2021-02-11 ENCOUNTER — Inpatient Hospital Stay: Payer: BC Managed Care – PPO | Attending: Hematology | Admitting: Nurse Practitioner

## 2021-02-11 ENCOUNTER — Inpatient Hospital Stay: Payer: BC Managed Care – PPO

## 2021-02-11 ENCOUNTER — Encounter: Payer: Self-pay | Admitting: Nurse Practitioner

## 2021-02-11 ENCOUNTER — Inpatient Hospital Stay: Payer: BC Managed Care – PPO | Admitting: Emergency Medicine

## 2021-02-11 ENCOUNTER — Encounter: Payer: Self-pay | Admitting: *Deleted

## 2021-02-11 ENCOUNTER — Other Ambulatory Visit: Payer: BC Managed Care – PPO

## 2021-02-11 VITALS — BP 118/65 | HR 93 | Temp 98.4°F | Resp 18 | Wt 132.5 lb

## 2021-02-11 DIAGNOSIS — Z5189 Encounter for other specified aftercare: Secondary | ICD-10-CM | POA: Insufficient documentation

## 2021-02-11 DIAGNOSIS — Z803 Family history of malignant neoplasm of breast: Secondary | ICD-10-CM | POA: Diagnosis not present

## 2021-02-11 DIAGNOSIS — C50412 Malignant neoplasm of upper-outer quadrant of left female breast: Secondary | ICD-10-CM

## 2021-02-11 DIAGNOSIS — F419 Anxiety disorder, unspecified: Secondary | ICD-10-CM | POA: Diagnosis not present

## 2021-02-11 DIAGNOSIS — Z9012 Acquired absence of left breast and nipple: Secondary | ICD-10-CM | POA: Insufficient documentation

## 2021-02-11 DIAGNOSIS — Z170 Estrogen receptor positive status [ER+]: Secondary | ICD-10-CM | POA: Insufficient documentation

## 2021-02-11 DIAGNOSIS — C773 Secondary and unspecified malignant neoplasm of axilla and upper limb lymph nodes: Secondary | ICD-10-CM | POA: Diagnosis not present

## 2021-02-11 MED ORDER — PALONOSETRON HCL INJECTION 0.25 MG/5ML
0.2500 mg | Freq: Once | INTRAVENOUS | Status: AC
  Administered 2021-02-11: 0.25 mg via INTRAVENOUS

## 2021-02-11 MED ORDER — SODIUM CHLORIDE 0.9 % IV SOLN
600.0000 mg/m2 | Freq: Once | INTRAVENOUS | Status: AC
  Administered 2021-02-11: 980 mg via INTRAVENOUS
  Filled 2021-02-11: qty 49

## 2021-02-11 MED ORDER — SODIUM CHLORIDE 0.9 % IV SOLN
10.0000 mg | Freq: Once | INTRAVENOUS | Status: AC
  Administered 2021-02-11: 10 mg via INTRAVENOUS
  Filled 2021-02-11: qty 10

## 2021-02-11 MED ORDER — SODIUM CHLORIDE 0.9 % IV SOLN: Freq: Once | INTRAVENOUS | Status: AC

## 2021-02-11 MED ORDER — SODIUM CHLORIDE 0.9% FLUSH
10.0000 mL | INTRAVENOUS | Status: DC | PRN
  Administered 2021-02-11: 10 mL

## 2021-02-11 MED ORDER — DOXORUBICIN HCL CHEMO IV INJECTION 2 MG/ML
60.0000 mg/m2 | Freq: Once | INTRAVENOUS | Status: AC
  Administered 2021-02-11: 98 mg via INTRAVENOUS
  Filled 2021-02-11: qty 49

## 2021-02-11 MED ORDER — HEPARIN SOD (PORK) LOCK FLUSH 100 UNIT/ML IV SOLN
500.0000 [IU] | Freq: Once | INTRAVENOUS | Status: AC | PRN
  Administered 2021-02-11: 500 [IU]

## 2021-02-11 MED ORDER — SODIUM CHLORIDE 0.9 % IV SOLN
150.0000 mg | Freq: Once | INTRAVENOUS | Status: AC
  Administered 2021-02-11: 150 mg via INTRAVENOUS
  Filled 2021-02-11: qty 150

## 2021-02-11 NOTE — Research (Signed)
DCP-001: Use of a Clinical Trial Screening Tool to Address Cancer Health Disparities in the [place])  CONSENT SIGNING  Patient Pamela Reyes was identified by this Clinical Research Nurse as a potential candidate for the above listed study.  This Clinical Research Nurse met with SHIZA THELEN, [id number] on 02/11/21 in a manner and location that ensures patient privacy to discuss participation in the above listed research study.  Patient is Unaccompanied.  Patient was previously provided with informed consent documents.  Patient has not yet read the informed consent documents and so documents were reviewed page by page today.  As outlined in the informed consent form, this Nurse and Blanchie Dessert discussed the purpose of the research study, the investigational nature of the study, study procedures and requirements for study participation, potential risks and benefits of study participation, as well as alternatives to participation.  This study is not blinded or double-blinded. The patient understands participation is voluntary and they may withdraw from study participation at any time.  This study does not involve randomization.  This study does not involve an investigational drug or device. This study does not involve a placebo. Patient understands enrollment is pending full eligibility review.   Confidentiality and how the patient's information will be used as part of study participation were discussed.  Patient was informed there is not reimbursement provided for their time and effort spent on trial participation.  The patient is encouraged to discuss research study participation with their insurance provider to determine what costs they may incur as part of study participation, including research related injury.    All questions were answered to patient's satisfaction.  The informed consent and separate HIPAA Authorization was reviewed page by page.  The  patient's mental and emotional status is appropriate to provide informed consent, and the patient verbalizes an understanding of study participation.  Patient has agreed to participate in the above listed research study and has voluntarily signed the informed consent version date 07/31/2018 ([facility] date 11/06/2020) and separate HIPAA Authorization, version date 01/23/2019 ([facility] date 12/02/20)  on 02/11/21 at 10:08 AM.  The patient was provided with a copy of the signed informed consent form and separate HIPAA Authorization for their reference.  No study specific procedures were obtained prior to the signing of the informed consent document.  Approximately 30 minutes were spent with the patient reviewing the informed consent documents.  Patient was not requested to complete a Release of Information form.  Wells Guiles 'Learta CoddingNeysa Bonito, RN, BSN Clinical Research Nurse I 02/11/21 10:38 AM

## 2021-02-11 NOTE — Patient Instructions (Signed)
[facility]  Discharge Instructions: Thank you for choosing [facility] to provide your oncology and hematology care.   If you have a lab appointment with the [facility], please go directly to the [facility] and check in at the registration area.   Wear comfortable clothing and clothing appropriate for easy access to any Portacath or PICC line.   We strive to give you quality time with your provider. You may need to reschedule your appointment if you arrive late (15 or more minutes).  Arriving late affects you and other patients whose appointments are after yours.  Also, if you miss three or more appointments without notifying the office, you may be dismissed from the clinic at the providers discretion.      For prescription refill requests, have your pharmacy contact our office and allow 72 hours for refills to be completed.    Today you received the following chemotherapy and/or immunotherapy agents: Doxorubicin and cytoxan      To help prevent nausea and vomiting after your treatment, we encourage you to take your nausea medication as directed.  BELOW ARE SYMPTOMS THAT SHOULD BE REPORTED IMMEDIATELY: *FEVER GREATER THAN 100.4 F (38 C) OR HIGHER *CHILLS OR SWEATING *NAUSEA AND VOMITING THAT IS NOT CONTROLLED WITH YOUR NAUSEA MEDICATION *UNUSUAL SHORTNESS OF BREATH *UNUSUAL BRUISING OR BLEEDING *URINARY PROBLEMS (pain or burning when urinating, or frequent urination) *BOWEL PROBLEMS (unusual diarrhea, constipation, pain near the anus) TENDERNESS IN MOUTH AND THROAT WITH OR WITHOUT PRESENCE OF ULCERS (sore throat, sores in mouth, or a toothache) UNUSUAL RASH, SWELLING OR PAIN  UNUSUAL VAGINAL DISCHARGE OR ITCHING   Items with * indicate a potential emergency and should be followed up as soon as possible or go to the Emergency Department if any problems should occur.  Please show the CHEMOTHERAPY ALERT CARD or IMMUNOTHERAPY ALERT CARD  at check-in to the Emergency Department and triage nurse.  Should you have questions after your visit or need to cancel or reschedule your appointment, please contact [facility]  Dept: [phone]  and follow the prompts.  Office hours are 8:00 a.m. to 4:30 p.m. Monday - Friday. Please note that voicemails left after 4:00 p.m. may not be returned until the following business day.  We are closed weekends and major holidays. You have access to a nurse at all times for urgent questions. Please call the main number to the clinic Dept: [phone] and follow the prompts.   For any non-urgent questions, you may also contact your provider using MyChart. We now offer e-Visits for anyone 69 and older to request care online for non-urgent symptoms. For details visit [email or web address].   Also download the MyChart app! Go to the app store, search "MyChart", open the app, select [facility], and log in with your MyChart username and password.  Due to Covid, a mask is required upon entering the hospital/clinic. If you do not have a mask, one will be given to you upon arrival. For doctor visits, patients may have 1 support person aged 52 or older with them. For treatment visits, patients cannot have anyone with them due to current Covid guidelines and our immunocompromised population.   Doxorubicin injection What is this medication? DOXORUBICIN (dox oh ROO bi sin) is a chemotherapy drug. It is used to treat many kinds of cancer like leukemia, lymphoma, neuroblastoma, sarcoma, and Wilms' tumor. It is also used to treat bladder cancer, breast cancer, lung cancer, ovarian cancer,  stomach cancer, and thyroid cancer. This medicine may be used for other purposes; ask your health care provider or pharmacist if you have questions. COMMON BRAND NAME(S): Adriamycin, Adriamycin PFS, Adriamycin RDF, Rubex What should I tell my care team before I take this medication? They need to know if  you have any of these conditions: heart disease history of low blood counts caused by a medicine liver disease recent or ongoing radiation therapy an unusual or allergic reaction to doxorubicin, other chemotherapy agents, other medicines, foods, dyes, or preservatives pregnant or trying to get pregnant breast-feeding How should I use this medication? This drug is given as an infusion into a vein. It is administered in a hospital or clinic by a specially trained health care professional. If you have pain, swelling, burning or any unusual feeling around the site of your injection, tell your health care professional right away. Talk to your pediatrician regarding the use of this medicine in children. Special care may be needed. Overdosage: If you think you have taken too much of this medicine contact a poison control center or emergency room at once. NOTE: This medicine is only for you. Do not share this medicine with others. What if I miss a dose? It is important not to miss your dose. Call your doctor or health care professional if you are unable to keep an appointment. What may interact with this medication? This medicine may interact with the following medications: 6-mercaptopurine paclitaxel phenytoin St. John's Wort trastuzumab verapamil This list may not describe all possible interactions. Give your health care provider a list of all the medicines, herbs, non-prescription drugs, or dietary supplements you use. Also tell them if you smoke, drink alcohol, or use illegal drugs. Some items may interact with your medicine. What should I watch for while using this medication? This drug may make you feel generally unwell. This is not uncommon, as chemotherapy can affect healthy cells as well as cancer cells. Report any side effects. Continue your course of treatment even though you feel ill unless your doctor tells you to stop. There is a maximum amount of this medicine you should receive  throughout your life. The amount depends on the medical condition being treated and your overall health. Your doctor will watch how much of this medicine you receive in your lifetime. Tell your doctor if you have taken this medicine before. You may need blood work done while you are taking this medicine. Your urine may turn red for a few days after your dose. This is not blood. If your urine is dark or brown, call your doctor. In some cases, you may be given additional medicines to help with side effects. Follow all directions for their use. Call your doctor or health care professional for advice if you get a fever, chills or sore throat, or other symptoms of a cold or flu. Do not treat yourself. This drug decreases your body's ability to fight infections. Try to avoid being around people who are sick. This medicine may increase your risk to bruise or bleed. Call your doctor or health care professional if you notice any unusual bleeding. Talk to your doctor about your risk of cancer. You may be more at risk for certain types of cancers if you take this medicine. Do not become pregnant while taking this medicine or for 6 months after stopping it. Women should inform their doctor if they wish to become pregnant or think they might be pregnant. Men should not father a child while  taking this medicine and for 6 months after stopping it. There is a potential for serious side effects to an unborn child. Talk to your health care professional or pharmacist for more information. Do not breast-feed an infant while taking this medicine. This medicine has caused ovarian failure in some women and reduced sperm counts in some men This medicine may interfere with the ability to have a child. Talk with your doctor or health care professional if you are concerned about your fertility. This medicine may cause a decrease in Co-Enzyme Q-10. You should make sure that you get enough Co-Enzyme Q-10 while you are taking this  medicine. Discuss the foods you eat and the vitamins you take with your health care professional. What side effects may I notice from receiving this medication? Side effects that you should report to your doctor or health care professional as soon as possible: allergic reactions like skin rash, itching or hives, swelling of the face, lips, or tongue breathing problems chest pain fast or irregular heartbeat low blood counts - this medicine may decrease the number of white blood cells, red blood cells and platelets. You may be at increased risk for infections and bleeding. pain, redness, or irritation at site where injected signs of infection - fever or chills, cough, sore throat, pain or difficulty passing urine signs of decreased platelets or bleeding - bruising, pinpoint red spots on the skin, black, tarry stools, blood in the urine swelling of the ankles, feet, hands tiredness weakness Side effects that usually do not require medical attention (report to your doctor or health care professional if they continue or are bothersome): diarrhea hair loss mouth sores nail discoloration or damage nausea red colored urine vomiting This list may not describe all possible side effects. Call your doctor for medical advice about side effects. You may report side effects to FDA at 1-800-FDA-1088. Where should I keep my medication? This drug is given in a hospital or clinic and will not be stored at home. NOTE: This sheet is a summary. It may not cover all possible information. If you have questions about this medicine, talk to your doctor, pharmacist, or health care provider.  2022 Elsevier/Gold Standard (2016-09-29 00:00:00)  Cyclophosphamide Injection What is this medication? CYCLOPHOSPHAMIDE (sye kloe FOSS fa mide) is a chemotherapy drug. It slows the growth of cancer cells. This medicine is used to treat many types of cancer like lymphoma, myeloma, leukemia, breast cancer, and ovarian cancer,  to name a few. This medicine may be used for other purposes; ask your health care provider or pharmacist if you have questions. COMMON BRAND NAME(S): Cytoxan, Neosar What should I tell my care team before I take this medication? They need to know if you have any of these conditions: heart disease history of irregular heartbeat infection kidney disease liver disease low blood counts, like white cells, platelets, or red blood cells on hemodialysis recent or ongoing radiation therapy scarring or thickening of the lungs trouble passing urine an unusual or allergic reaction to cyclophosphamide, other medicines, foods, dyes, or preservatives pregnant or trying to get pregnant breast-feeding How should I use this medication? This drug is usually given as an injection into a vein or muscle or by infusion into a vein. It is administered in a hospital or clinic by a specially trained health care professional. Talk to your pediatrician regarding the use of this medicine in children. Special care may be needed. Overdosage: If you think you have taken too much of this medicine contact  a poison control center or emergency room at once. NOTE: This medicine is only for you. Do not share this medicine with others. What if I miss a dose? It is important not to miss your dose. Call your doctor or health care professional if you are unable to keep an appointment. What may interact with this medication? amphotericin B azathioprine certain antivirals for HIV or hepatitis certain medicines for blood pressure, heart disease, irregular heart beat certain medicines that treat or prevent blood clots like warfarin certain other medicines for cancer cyclosporine etanercept indomethacin medicines that relax muscles for surgery medicines to increase blood counts metronidazole This list may not describe all possible interactions. Give your health care provider a list of all the medicines, herbs,  non-prescription drugs, or dietary supplements you use. Also tell them if you smoke, drink alcohol, or use illegal drugs. Some items may interact with your medicine. What should I watch for while using this medication? Your condition will be monitored carefully while you are receiving this medicine. You may need blood work done while you are taking this medicine. Drink water or other fluids as directed. Urinate often, even at night. Some products may contain alcohol. Ask your health care professional if this medicine contains alcohol. Be sure to tell all health care professionals you are taking this medicine. Certain medicines, like metronidazole and disulfiram, can cause an unpleasant reaction when taken with alcohol. The reaction includes flushing, headache, nausea, vomiting, sweating, and increased thirst. The reaction can last from 30 minutes to several hours. Do not become pregnant while taking this medicine or for 1 year after stopping it. Women should inform their health care professional if they wish to become pregnant or think they might be pregnant. Men should not father a child while taking this medicine and for 4 months after stopping it. There is potential for serious side effects to an unborn child. Talk to your health care professional for more information. Do not breast-feed an infant while taking this medicine or for 1 week after stopping it. This medicine has caused ovarian failure in some women. This medicine may make it more difficult to get pregnant. Talk to your health care professional if you are concerned about your fertility. This medicine has caused decreased sperm counts in some men. This may make it more difficult to father a child. Talk to your health care professional if you are concerned about your fertility. Call your health care professional for advice if you get a fever, chills, or sore throat, or other symptoms of a cold or flu. Do not treat yourself. This medicine  decreases your body's ability to fight infections. Try to avoid being around people who are sick. Avoid taking medicines that contain aspirin, acetaminophen, ibuprofen, naproxen, or ketoprofen unless instructed by your health care professional. These medicines may hide a fever. Talk to your health care professional about your risk of cancer. You may be more at risk for certain types of cancer if you take this medicine. If you are going to need surgery or other procedure, tell your health care professional that you are using this medicine. Be careful brushing or flossing your teeth or using a toothpick because you may get an infection or bleed more easily. If you have any dental work done, tell your dentist you are receiving this medicine. What side effects may I notice from receiving this medication? Side effects that you should report to your doctor or health care professional as soon as possible: allergic reactions like  skin rash, itching or hives, swelling of the face, lips, or tongue breathing problems nausea, vomiting signs and symptoms of bleeding such as bloody or black, tarry stools; red or dark brown urine; spitting up blood or brown material that looks like coffee grounds; red spots on the skin; unusual bruising or bleeding from the eyes, gums, or nose signs and symptoms of heart failure like fast, irregular heartbeat, sudden weight gain; swelling of the ankles, feet, hands signs and symptoms of infection like fever; chills; cough; sore throat; pain or trouble passing urine signs and symptoms of kidney injury like trouble passing urine or change in the amount of urine signs and symptoms of liver injury like dark yellow or brown urine; general ill feeling or flu-like symptoms; light-colored stools; loss of appetite; nausea; right upper belly pain; unusually weak or tired; yellowing of the eyes or skin Side effects that usually do not require medical attention (report to your doctor or health  care professional if they continue or are bothersome): confusion decreased hearing diarrhea facial flushing hair loss headache loss of appetite missed menstrual periods signs and symptoms of low red blood cells or anemia such as unusually weak or tired; feeling faint or lightheaded; falls skin discoloration This list may not describe all possible side effects. Call your doctor for medical advice about side effects. You may report side effects to FDA at 1-800-FDA-1088. Where should I keep my medication? This drug is given in a hospital or clinic and will not be stored at home. NOTE: This sheet is a summary. It may not cover all possible information. If you have questions about this medicine, talk to your doctor, pharmacist, or health care provider.  2022 Elsevier/Gold Standard (2020-10-13 00:00:00)

## 2021-02-11 NOTE — Research (Signed)
[id number] - TREATMENT OF REFRACTORY NAUSEA  D1C1  Verified that patient completed Baseline questionnaires online.  Patient provided with 4 day home record to fill out and return via mail.  Patient also provided with back up versions of Cycle 1 questionnaires with instructions to complete and return these if she does not receive email questionnaires within next few days.  Pt aware to expect f/u phone call on Monday (02/15/21) per protocol.  Pt denies any questions or concerns at this time.  Wells Guiles 'Learta CoddingNeysa Bonito, RN, BSN Clinical Research Nurse I 02/11/21 10:52 AM

## 2021-02-13 ENCOUNTER — Other Ambulatory Visit: Payer: Self-pay

## 2021-02-13 ENCOUNTER — Inpatient Hospital Stay: Payer: BC Managed Care – PPO

## 2021-02-13 VITALS — BP 119/57 | HR 81 | Temp 97.6°F | Resp 16

## 2021-02-13 DIAGNOSIS — C50412 Malignant neoplasm of upper-outer quadrant of left female breast: Secondary | ICD-10-CM | POA: Diagnosis not present

## 2021-02-13 MED ORDER — PEGFILGRASTIM-BMEZ 6 MG/0.6ML ~~LOC~~ SOSY
6.0000 mg | PREFILLED_SYRINGE | Freq: Once | SUBCUTANEOUS | Status: AC
  Administered 2021-02-13: 6 mg via SUBCUTANEOUS

## 2021-02-15 ENCOUNTER — Telehealth: Payer: Self-pay | Admitting: Emergency Medicine

## 2021-02-15 LAB — CYTOLOGY - PAP
Comment: NEGATIVE
Diagnosis: NEGATIVE
High risk HPV: NEGATIVE

## 2021-02-15 NOTE — Telephone Encounter (Signed)
[id number] - TREATMENT OF REFRACTORY NAUSEA  DAY 4 CYCLE 1 PHONE CALL  Contacted the patient per protocol for Day 4 of Cycle 1.  The patient reports that she completed and mailed back her 4 Day Home Record yesterday, as well as completed the [place] for Cycle 1.  The patient reports that she experience moderate to severe nausea (Grade 2) in the days following her treatment, with a score of at least 4 on her 4 day home record.  The patient denies any vomiting.  Reports taking some of her nausea medication for this.  The patient does not report any further symptoms besides a general feeling of fatigue (Grade 1).  Both of these adverse events are unrelated to study questionnaires or blood draw, no reporting or action required at this time.  The patient is interested in moving on to Cycle 2 of study.  Eligibility check pending- if eligible the patient will be randomized for Cycle 2.  Wells Guiles 'Learta CoddingNeysa Bonito, RN, BSN Clinical Research Nurse I 02/15/21 1:42 PM

## 2021-02-18 ENCOUNTER — Encounter: Payer: Self-pay | Admitting: *Deleted

## 2021-02-18 ENCOUNTER — Inpatient Hospital Stay (HOSPITAL_BASED_OUTPATIENT_CLINIC_OR_DEPARTMENT_OTHER): Payer: BC Managed Care – PPO | Admitting: Hematology

## 2021-02-18 ENCOUNTER — Other Ambulatory Visit: Payer: Self-pay

## 2021-02-18 ENCOUNTER — Encounter: Payer: Self-pay | Admitting: Hematology

## 2021-02-18 ENCOUNTER — Encounter: Payer: Self-pay | Admitting: Emergency Medicine

## 2021-02-18 ENCOUNTER — Inpatient Hospital Stay: Payer: BC Managed Care – PPO

## 2021-02-18 VITALS — BP 99/63 | HR 95 | Temp 97.9°F | Resp 17 | Ht 64.0 in | Wt 130.2 lb

## 2021-02-18 DIAGNOSIS — C50412 Malignant neoplasm of upper-outer quadrant of left female breast: Secondary | ICD-10-CM

## 2021-02-18 DIAGNOSIS — Z170 Estrogen receptor positive status [ER+]: Secondary | ICD-10-CM | POA: Diagnosis not present

## 2021-02-18 DIAGNOSIS — Z95828 Presence of other vascular implants and grafts: Secondary | ICD-10-CM

## 2021-02-18 LAB — CMP (CANCER CENTER ONLY)
ALT: 9 U/L (ref 0–44)
AST: 8 U/L — ABNORMAL LOW (ref 15–41)
Albumin: 4.2 g/dL (ref 3.5–5.0)
Alkaline Phosphatase: 55 U/L (ref 38–126)
Anion gap: 7 (ref 5–15)
BUN: 11 mg/dL (ref 6–20)
CO2: 30 mmol/L (ref 22–32)
Calcium: 10.1 mg/dL (ref 8.9–10.3)
Chloride: 101 mmol/L (ref 98–111)
Creatinine: 0.52 mg/dL (ref 0.44–1.00)
GFR, Estimated: >60 mL/min (ref >60)
Glucose, Bld: 86 mg/dL (ref 70–99)
Potassium: 4.1 mmol/L (ref 3.5–5.1)
Sodium: 138 mmol/L (ref 135–145)
Total Bilirubin: 0.3 mg/dL (ref 0.3–1.2)
Total Protein: 6.9 g/dL (ref 6.5–8.1)

## 2021-02-18 LAB — CBC WITH DIFFERENTIAL (CANCER CENTER ONLY)
Abs Immature Granulocytes: 0.13 10*3/uL — ABNORMAL HIGH (ref 0.00–0.07)
Basophils Absolute: 0.1 10*3/uL (ref 0.0–0.1)
Basophils Relative: 2 %
Eosinophils Absolute: 0.2 10*3/uL (ref 0.0–0.5)
Eosinophils Relative: 6 %
HCT: 36.9 % (ref 36.0–46.0)
Hemoglobin: 12.7 g/dL (ref 12.0–15.0)
Immature Granulocytes: 3 %
Lymphocytes Relative: 33 %
Lymphs Abs: 1.2 10*3/uL (ref 0.7–4.0)
MCH: 30.7 pg (ref 26.0–34.0)
MCHC: 34.4 g/dL (ref 30.0–36.0)
MCV: 89.1 fL (ref 80.0–100.0)
Monocytes Absolute: 0.4 10*3/uL (ref 0.1–1.0)
Monocytes Relative: 10 %
Neutro Abs: 1.7 10*3/uL (ref 1.7–7.7)
Neutrophils Relative %: 46 %
Platelet Count: 179 10*3/uL (ref 150–400)
RBC: 4.14 MIL/uL (ref 3.87–5.11)
RDW: 12.8 % (ref 11.5–15.5)
Smear Review: NORMAL
WBC Count: 3.8 10*3/uL — ABNORMAL LOW (ref 4.0–10.5)
nRBC: 0 % (ref 0.0–0.2)

## 2021-02-18 MED ORDER — HEPARIN SOD (PORK) LOCK FLUSH 100 UNIT/ML IV SOLN
500.0000 [IU] | Freq: Once | INTRAVENOUS | Status: AC
  Administered 2021-02-18: 500 [IU] via INTRAVENOUS

## 2021-02-18 MED ORDER — SODIUM CHLORIDE 0.9% FLUSH
10.0000 mL | INTRAVENOUS | Status: DC | PRN
  Administered 2021-02-18: 10 mL via INTRAVENOUS

## 2021-02-18 NOTE — Research (Signed)
[id number] - TREATMENT OF REFRACTORY NAUSEA  CYCLE 2 ELIGIBILITY CHECK  This Nurse has reviewed this patient's inclusion and exclusion criteria and confirmed MARGA GRAMAJO is eligible for Cycle 2 of study participation.  Patient will continue with randomization on Cycle 2.  Patient scored a 3/7 nausea score at least once on 4 day home record.  No changes in medications or allergies since last infusion visit or since [facility] visit today other than Asorbic Acid supplement which patient requested to be d/c on 02/15/2021.   Patient took Ativan (benzodiazepine) for PRN nausea management only 3 times since last chemo administration.  Patient is scheduled to receive the same chemo regimen for Cycle 2 as Cycle 1 per MD [facility] note today.  Patient did not receive Akynzeo during Cycle 1.    Patient still meets all inclusion and exclusion criteria for Cycle 1.  Wells Guiles 'Learta CoddingNeysa Bonito, RN, BSN Clinical Research Nurse I 02/18/21 1:35 PM

## 2021-02-18 NOTE — Progress Notes (Signed)
[place]   [phone] [phone]   Clinic Follow up Note   Patient Care Team: Maximiano Coss, NP as PCP - General (Adult Health Nurse Practitioner) Mauro Kaufmann, RN as Oncology Nurse Navigator Rockwell Germany, RN as Oncology Nurse Navigator Stark Klein, MD as Consulting Physician (General Surgery) [name], MD as Consulting Physician (Hematology) Kyung Rudd, MD as Consulting Physician (Radiation Oncology) Estill Dooms, NP as Nurse Practitioner (Obstetrics and Gynecology)  Date of Service:  02/18/2021  CHIEF COMPLAINT: f/u of left breast cancer, toxicity check  CURRENT THERAPY:  Adjuvant AC-T q14d, starting 02/11/21  ASSESSMENT & PLAN:  Pamela Reyes is a 41 y.o. female with   1. 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 The mass has grown significantly since 2017. Biopsy on 11/05/20 confirmed invasive ductal carcinoma, with metastatic involvement of one lymph node. -staging CT CAP and bone scan on 11/19/20 showed no distant metastasis -she underwent left mastectomy on 01/06/21 under Dr. Barry Dienes. Pathology showed 6.5 cm invasive ductal carcinoma, grade 2, involving dermis of nipple without epidermal involvement. Margins were negative, but all 4 lymph nodes showed metastatic disease. -Due to the large tumor and 4 positive lymph nodes, the recommendation is for AC-T. She began chemo on 02/11/21. -baseline echo 02/04/21 is normal, EF 60-65% with normal GLS.  -she tolerated cycle 1 relatively well overall; she experienced intense nausea (without vomiting) and fatigue. We discussed the use of dexamethasone, nausea medicine, and the clinical study. -she will return in one week for cycle 2.   2. Genetics -she has a strong family history of breast cancer. She  reports her sister, who was diagnosed at age 33 and has since recovered, had negative genetic testing. -results were negative.   3. Anxiety and stress -she has been connected with our social worker   4. Health maintenance -She had pap/pelvic yesterday 02/10/21 -she takes vitamin C, D, B complex, and pre/probiotic for GI health. Ok to continue  -cryotherapy with chemo   PLAN: -lab, flush, f/u, and AC-T next week and every 2 weeks as scheduled   No problem-specific Assessment & Plan notes found for this encounter.   SUMMARY OF ONCOLOGIC HISTORY: Oncology History Overview Note   Cancer Staging  Malignant neoplasm of upper-outer quadrant of left breast in female, estrogen receptor positive ([place]) Staging form: Breast, AJCC 8th Edition - Clinical stage from 11/05/2020: Stage IIA (cT2, cN1, cM0, G1, ER+, PR+, HER2: Equivocal) - Signed by [name], MD on 11/10/2020 - Pathologic stage from 01/06/2021: Stage IB (pT3, pN2a, cM0, G2, ER+, PR+, HER2-) - Signed by [name], MD on 01/12/2021     Malignant neoplasm of upper-outer quadrant of left breast in female, estrogen receptor positive ([place])  10/28/2020 Mammogram   Bilateral Diagnostic Mammogram; Left Breast Ultrasound  IMPRESSION: 1. Suspicious palpable mass/hypoechoic area in the left breast at 12 o'clock retroareolar measuring 4.5 cm.   2.  Suspicious lymph node in the left axilla.   11/05/2020 Cancer Staging   Staging form: Breast, AJCC 8th Edition - Clinical stage from 11/05/2020: Stage IIA (cT2, cN1, cM0, G1, ER+, PR+, HER2: Equivocal) - Signed by [name], MD on 11/10/2020 Stage prefix: Initial diagnosis Histologic grading system: 3 grade system    11/05/2020 Pathology Results   Diagnosis 1. Breast, left, needle core biopsy, 12 o'clock subareolar, ribbon clip -  INVASIVE MAMMARY CARCINOMA. SEE NOTE 1. Carcinoma measures 1.5 cm in greatest linear dimension and appears grade 2. 1. Immunohistochemical stain for E-cadherin is  positive in the tumor cells, consistent with a ductal phenotype. 1. PROGNOSTIC INDICATORS Results: The tumor cells are EQUIVOCAL for Her2 (2+). Her2 by FISH will be performed and results reported separately. Estrogen Receptor: 70%, POSITIVE, STRONG-MODERATE STAINING INTENSITY Progesterone Receptor: 80%, POSITIVE, MODERATE STAINING INTENSITY Proliferation Marker Ki67: 1%  2. Lymph node, needle/core biopsy, left axilla, tribell clip - METASTATIC CARCINOMA TO A LYMPH NODE. SEE NOTE 2. Largest contiguous focus of metastatic carcinoma measures 0.3 cm. 2. PROGNOSTIC INDICATORS Results: The tumor cells are EQUIVOCAL for Her2 (2+). Her2 by FISH will be performed and results reported separately. Estrogen Receptor: 90%, POSITIVE, STRONG STAINING INTENSITY Progesterone Receptor: 95%, POSITIVE, STRONG STAINING INTENSITY Proliferation Marker Ki67: 1%   11/10/2020 Initial Diagnosis   Malignant neoplasm of upper-outer quadrant of left breast in female, estrogen receptor positive ([place])   11/19/2020 Imaging   EXAM: CT CHEST, ABDOMEN, AND PELVIS WITH CONTRAST  IMPRESSION: 1. Left breast mass  as previously described. 2. Enhancing subcentimeter left axillary lymph nodes. 3. No evidence of distal metastatic disease. 4. Small hiatal hernia   11/19/2020 Imaging   EXAM: NUCLEAR MEDICINE WHOLE BODY BONE SCAN  IMPRESSION: No evidence of metastatic disease.   11/21/2020 Imaging   EXAM: BILATERAL BREAST MRI WITH AND WITHOUT CONTRAST  IMPRESSION: 1. Suspicious 1.3 centimeter mass in the anterior UPPER central RIGHT breast warranting tissue diagnosis. (Image 65 of series 7). 2. 4 millimeter possible satellite nodule posterior to the 1.3 centimeter mass in the RIGHT breast. (Image 63 of series 7). 3. Indeterminate oval mass in the UPPER central middle depth of the RIGHT breast warranting tissue diagnosis. (Image 60 of series 7). 4. Large area of enhancement in the central portion of the  LEFT breast, involving all quadrants. There is associated enhancement and retraction of the LEFT nipple, with significantly smaller size of the LEFT breast. Enhancement spans at least 6.5 centimeters. 5. Enlarged, previously biopsied LEFT axillary lymph node.   11/24/2020 Genetic Testing   Negative hereditary cancer genetic testing: no pathogenic variants detected in [facility] BRCAPlus STAT Panel or [facility] CustomNext-Cancer +RNAinsight Panel.  The report dates are November 24, 2020 and November 27, 2020, respectively.   The BRCAplus panel offered by [employer] and includes sequencing and deletion/duplication analysis for the following 8 genes: ATM, BRCA1, BRCA2, CDH1, CHEK2, PALB2, PTEN, and TP53.  The CustomNext-Cancer+RNAinsight panel offered by [facility] includes sequencing and rearrangement analysis for the following 47 genes:  APC, ATM, AXIN2, BARD1, BMPR1A, BRCA1, BRCA2, BRIP1, CDH1, CDK4, CDKN2A, CHEK2, DICER1, EPCAM, GREM1, HOXB13, MEN1, MLH1, MSH2, MSH3, MSH6, MUTYH, NBN, NF1, NF2, NTHL1, PALB2, PMS2, POLD1, POLE, PTEN, RAD51C, RAD51D, RECQL, RET, SDHA, SDHAF2, SDHB, SDHC, SDHD, SMAD4, SMARCA4, STK11, TP53, TSC1, TSC2, and VHL.  RNA data is routinely analyzed for use in variant interpretation for all genes.    11/27/2020 Pathology Results   Diagnosis Breast, right, needle core biopsy, anterior upper central - FIBROADENOMATOID AND FIBROCYSTIC CHANGES WITH CALCIFICATIONS - PSEUDOANGIOMATOUS STROMAL HYPERPLASIA - FOCAL PERIDUCTULAR CHRONIC INFLAMMATION - NO MALIGNANCY IDENTIFIED   01/06/2021 Cancer Staging   Staging form: Breast, AJCC 8th Edition - Pathologic stage from 01/06/2021: Stage IB (pT3, pN2a, cM0, G2, ER+, PR+, HER2-) - Signed by [name], MD on 01/12/2021 Stage prefix: Initial diagnosis Histologic grading system: 3 grade system Residual tumor (R): R0 - None    01/06/2021 Definitive Surgery  FINAL MICROSCOPIC DIAGNOSIS:   A. 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 BREAST, LEFT INFERIOLATERAL MARGIN, EXCISION:  - Benign fibroadipose tissue  - Negative for carcinoma   D. LYMPH NODE, LEFT AXILLARY #1, SENTINEL, EXCISION:  - Metastatic carcinoma to a lymph node (1/1)  - Focus of metastatic carcinoma measures 1.4 cm without evidence of extranodal extension   E. LYMPH NODE, LEFT AXILLARY #2, SENTINEL, EXCISION:  - Metastatic carcinoma to a lymph node (1/1)  - Focus of metastatic carcinoma measures 0.5 cm without evidence of extranodal extension   F. LYMPH NODE, LEFT AXILLARY #3, SENTINEL, EXCISION:  - Metastatic carcinoma to a lymph node (1/1)  - Focus of metastatic carcinoma measures 0.9 cm without evidence of extranodal extension   G. LYMPH NODE, LEFT AXILLARY #4, SENTINEL, EXCISION:  - Benign fibroadipose tissue, negative for carcinoma  - Lymphoid tissue is not identified    02/11/2021 -  Chemotherapy   Patient is on Treatment Plan : BREAST ADJUVANT DOSE DENSE AC q14d / PACLitaxel q7d        INTERVAL HISTORY:  Pamela Reyes is here for a follow up of breast cancer. She was last seen by NP Lacie on 02/12/20. She presents to the clinic alone. She reports she had a rough time several days after infusion but notes she is recovering. She reports she had "level 9" nausea but denies vomiting; she notes a lot of "dry-heaving." She also notes some external vaginal itching that just started yesterday. She denies abnormal discharge or internal itching.   All other systems were reviewed with the patient and are negative.  MEDICAL HISTORY:  Past Medical History:   Diagnosis Date   Anxiety    Breast cancer ([place])    Family history of breast cancer 11/11/2020   Fibroid 10/27/2020   Has 1 x .9 x .6 cm subserosal fibroid   History of hiatal hernia     SURGICAL HISTORY: Past Surgical History:  Procedure Laterality Date   BREAST RECONSTRUCTION WITH PLACEMENT OF TISSUE EXPANDER AND FLEX HD (ACELLULAR HYDRATED DERMIS) Left 01/06/2021   Procedure: LEFT BREAST RECONSTRUCTION WITH PLACEMENT OF TISSUE EXPANDER AND ACELLULAR  DERMIS;  Surgeon: Irene Limbo, MD;  Location: [facility];  Service: Plastics;  Laterality: Left;   MASTECTOMY W/ SENTINEL NODE BIOPSY Left 01/06/2021   Procedure: LEFT MASTECTOMY WITH MAGTRACE;  Surgeon: Stark Klein, MD;  Location: [facility];  Service: General;  Laterality: Left;   PORTACATH PLACEMENT Right 01/19/2021   Procedure: INSERTION PORT-A-CATH;  Surgeon: Stark Klein, MD;  Location: [facility];  Service: General;  Laterality: Right;   RADIOACTIVE SEED GUIDED AXILLARY SENTINEL LYMPH NODE Left 01/06/2021   Procedure: RADIOACTIVE SEED GUIDED SENTINEL LYMPH NODE BIOPSY;  Surgeon: Stark Klein, MD;  Location: [facility];  Service: General;  Laterality: Left;   SENTINEL NODE BIOPSY Left 01/06/2021   Procedure: LEFT AXILLARY SENTINEL LYMPH  NODE BIOPSY;  Surgeon: Stark Klein, MD;  Location: [facility];  Service: General;  Laterality: Left;   WISDOM TOOTH EXTRACTION      I have reviewed the social history and family history  with the patient and they are unchanged from previous note.  ALLERGIES:  has No Known Allergies.  MEDICATIONS:  Current Outpatient Medications  Medication Sig Dispense Refill   acetaminophen (TYLENOL) 500 MG tablet Take 500-1,000 mg by mouth every 6 (six) hours as needed for moderate pain or headache.     Cholecalciferol (DIALYVITE VITAMIN D 5000) 125 MCG (5000 UT) capsule Take 5,000 Units by mouth daily. Vit D and K supplement combined     dexamethasone (DECADRON) 4 MG tablet Take 1 tablet (4 mg total) by mouth daily. Take  daily for 3 days after chemo. Take with food. 20 tablet 0   diphenhydrAMINE (BENADRYL) 25 MG tablet Take 25 mg by mouth at bedtime as needed for sleep.     famotidine (PEPCID) 10 MG tablet Take 10 mg by mouth daily as needed for heartburn or indigestion.     lidocaine-prilocaine (EMLA) cream Apply to affected area once 30 g 3   LORazepam (ATIVAN) 0.5 MG tablet Take 1 tablet (0.5 mg total) by mouth every 6 (six) hours as needed (Nausea or vomiting). 20 tablet 0   ondansetron (ZOFRAN) 8 MG tablet Take 1 tablet (8 mg total) by mouth 2 (two) times daily as needed. Start on the third day after chemotherapy. 30 tablet 1   prochlorperazine (COMPAZINE) 10 MG tablet Take 1 tablet (10 mg total) by mouth every 6 (six) hours as needed (Nausea or vomiting). 30 tablet 1   No current facility-administered medications for this visit.    PHYSICAL EXAMINATION: ECOG PERFORMANCE STATUS: 1 - Symptomatic but completely ambulatory  Vitals:   02/18/21 0906  BP: 99/63  Pulse: 95  Resp: 17  Temp: 97.9 F (36.6 C)  SpO2: 100%   Wt Readings from Last 3 Encounters:  02/18/21 130 lb 3.2 oz (59.1 kg)  02/11/21 132 lb 8 oz (60.1 kg)  02/10/21 131 lb 12.8 oz (59.8 kg)     GENERAL:alert, no distress and comfortable SKIN: skin color normal, no rashes or significant lesions EYES: normal, Conjunctiva are pink and non-injected, sclera clear  NEURO: alert & oriented x 3 with fluent speech  LABORATORY DATA:  I have reviewed the data as listed CBC Latest Ref Rng & Units 02/18/2021 02/04/2021 01/04/2021  WBC 4.0 - 10.5 K/uL 3.8(L) 5.6 5.6  Hemoglobin 12.0 - 15.0 g/dL 12.7 14.0 14.5  Hematocrit 36.0 - 46.0 % 36.9 41.8 43.5  Platelets 150 - 400 K/uL 179 278 246     CMP Latest Ref Rng & Units 02/18/2021 02/04/2021 11/11/2020  Glucose 70 - 99 mg/dL 86 88 124(H)  BUN 6 - 20 mg/dL _0 Creatinine 0.44 - 1.00 mg/dL 0.52 0.67 0.82  Sodium 135 - 145 mmol/L 138 140 141  Potassium 3.5 - 5.1 mmol/L 4.1 4.1 3.9   Chloride 98 - 111 mmol/L 101 103 107  CO2 22 - 32 mmol/L _1 Calcium 8.9 - 10.3 mg/dL 10.1 10.1 9.3  Total Protein 6.5 - 8.1 g/dL 6.9 7.9 7.3  Total Bilirubin 0.3 - 1.2 mg/dL 0.3 0.5 0.4  Alkaline Phos 38 - 126 U/L 55 33(L) 26(L)  AST 15 - 41 U/L 8(L) 13(L) 13(L)  ALT 0 - 44 U/L _2 RADIOGRAPHIC STUDIES: I have personally reviewed the radiological images as listed and agreed with the findings in the report. No results found.    No orders of the defined types were placed in this encounter.  All questions were answered.  The patient knows to call the clinic with any problems, questions or concerns. No barriers to learning was detected. The total time spent in the appointment was 25 minutes.     [name], MD 02/18/2021   I, Wilburn Mylar, am acting as scribe for [name], MD.   I have reviewed the above documentation for accuracy and completeness, and I agree with the above.

## 2021-02-18 NOTE — Research (Signed)
[id number] - TREATMENT OF REFRACTORY NAUSEA  This Nurse has reviewed this patient's inclusion and exclusion criteria as a second review and confirms PENI RUPARD is eligible for study participation in Cycle 2 of this trial.  Patient may continue with enrollment.  Vickii Penna, RN, BSN, CPN Clinical Research Nurse I [phone]  02/18/2021 1:40 PM

## 2021-02-18 NOTE — Research (Signed)
[id number] - TREATMENT OF REFRACTORY NAUSEA  RANDOMIZATION CYCLE 2  Patient MIIA BLANKS is randomized to Arm C.  Stratification criteria were confirmed by this clinical research Nurse and clinical research Nurse  Vickii Penna verified the stratification factors used for randomization.  As part of the assigned treatment arm, patient JAMIRRA CURNOW will receive Arm C treatment. MD will be notified of patient assignment; treatment will begin 02/25/2021.  Per study protocol, patient KAYCE CHISMAR will not be notified of their treatment assignment. Patient RONNA HERSKOWITZ is successfully enrolled in the above study.  Stratification Factors: - NCORP- SCOR - Presence of vomiting-- No - Whether the participant received doxorubicin at Cycle 1 - Yes - Chemotherapy setting- Laurel 'Learta CoddingNeysa Bonito, RN, BSN Clinical Research Nurse I 02/18/21 2:05 PM

## 2021-02-19 ENCOUNTER — Encounter: Payer: Self-pay | Admitting: Hematology

## 2021-02-24 ENCOUNTER — Encounter: Payer: Self-pay | Admitting: Hematology

## 2021-02-24 ENCOUNTER — Telehealth: Payer: Self-pay | Admitting: Emergency Medicine

## 2021-02-24 ENCOUNTER — Telehealth: Payer: Self-pay

## 2021-02-24 MED FILL — Fosaprepitant Dimeglumine For IV Infusion 150 MG (Base Eq): INTRAVENOUS | Qty: 5 | Status: AC

## 2021-02-24 MED FILL — Dexamethasone Sodium Phosphate Inj 100 MG/10ML: INTRAMUSCULAR | Qty: 1 | Status: AC

## 2021-02-24 NOTE — Telephone Encounter (Signed)
Pt called yesterday 02/23/2021 stating that she is experiencing severe vaginal itching.  Pt stated its embarrassing and wants to know what can she take or do to get this resolved.  Pt stated she's currently applying Vagisil to the area with some relief. Pt would like Dr. Ernestina Penna or Regan Rakers Burton's NP opinion on what she should do.  Sent Staff message to Cira Rue, NP and Dr. Burr Medico regarding pt.

## 2021-02-24 NOTE — Telephone Encounter (Signed)
[id number] - TREATMENT OF REFRACTORY NAUSEA  Reminder call for patient to complete her online or paper questionnaires before visits tomorrow.  No answer, left VM with this information and call back info for any questions/concerns before then.  Wells Guiles 'Learta CoddingNeysa Bonito, RN, BSN Clinical Research Nurse I 02/24/21 3:25 PM

## 2021-02-25 ENCOUNTER — Inpatient Hospital Stay: Payer: BC Managed Care – PPO

## 2021-02-25 ENCOUNTER — Other Ambulatory Visit: Payer: Self-pay | Admitting: Emergency Medicine

## 2021-02-25 ENCOUNTER — Inpatient Hospital Stay (HOSPITAL_BASED_OUTPATIENT_CLINIC_OR_DEPARTMENT_OTHER): Payer: BC Managed Care – PPO | Admitting: Hematology

## 2021-02-25 ENCOUNTER — Inpatient Hospital Stay: Payer: BC Managed Care – PPO | Admitting: Emergency Medicine

## 2021-02-25 ENCOUNTER — Other Ambulatory Visit: Payer: Self-pay

## 2021-02-25 VITALS — BP 121/66 | HR 85 | Temp 98.9°F | Resp 16 | Ht 64.0 in | Wt 135.5 lb

## 2021-02-25 DIAGNOSIS — C50412 Malignant neoplasm of upper-outer quadrant of left female breast: Secondary | ICD-10-CM

## 2021-02-25 DIAGNOSIS — Z95828 Presence of other vascular implants and grafts: Secondary | ICD-10-CM

## 2021-02-25 DIAGNOSIS — Z170 Estrogen receptor positive status [ER+]: Secondary | ICD-10-CM

## 2021-02-25 LAB — CMP (CANCER CENTER ONLY)
ALT: 29 U/L (ref 0–44)
AST: 16 U/L (ref 15–41)
Albumin: 4.3 g/dL (ref 3.5–5.0)
Alkaline Phosphatase: 55 U/L (ref 38–126)
Anion gap: 8 (ref 5–15)
BUN: 16 mg/dL (ref 6–20)
CO2: 25 mmol/L (ref 22–32)
Calcium: 9.6 mg/dL (ref 8.9–10.3)
Chloride: 106 mmol/L (ref 98–111)
Creatinine: 0.63 mg/dL (ref 0.44–1.00)
GFR, Estimated: >60 mL/min (ref >60)
Glucose, Bld: 86 mg/dL (ref 70–99)
Potassium: 4.1 mmol/L (ref 3.5–5.1)
Sodium: 139 mmol/L (ref 135–145)
Total Bilirubin: 0.2 mg/dL — ABNORMAL LOW (ref 0.3–1.2)
Total Protein: 7 g/dL (ref 6.5–8.1)

## 2021-02-25 LAB — CBC WITH DIFFERENTIAL (CANCER CENTER ONLY)
Abs Immature Granulocytes: 0.89 10*3/uL — ABNORMAL HIGH (ref 0.00–0.07)
Basophils Absolute: 0.1 10*3/uL (ref 0.0–0.1)
Basophils Relative: 1 %
Eosinophils Absolute: 0 10*3/uL (ref 0.0–0.5)
Eosinophils Relative: 0 %
HCT: 36.6 % (ref 36.0–46.0)
Hemoglobin: 12.3 g/dL (ref 12.0–15.0)
Immature Granulocytes: 8 %
Lymphocytes Relative: 20 %
Lymphs Abs: 2.2 10*3/uL (ref 0.7–4.0)
MCH: 30.4 pg (ref 26.0–34.0)
MCHC: 33.6 g/dL (ref 30.0–36.0)
MCV: 90.4 fL (ref 80.0–100.0)
Monocytes Absolute: 0.8 10*3/uL (ref 0.1–1.0)
Monocytes Relative: 7 %
Neutro Abs: 7 10*3/uL (ref 1.7–7.7)
Neutrophils Relative %: 64 %
Platelet Count: 252 10*3/uL (ref 150–400)
RBC: 4.05 MIL/uL (ref 3.87–5.11)
RDW: 13.6 % (ref 11.5–15.5)
WBC Count: 10.9 10*3/uL — ABNORMAL HIGH (ref 4.0–10.5)
nRBC: 0 % (ref 0.0–0.2)

## 2021-02-25 MED ORDER — INV-DEXAMETHASONE 4 MG PO TAB URCC 16070 BLISTER CARD 2: 2.0000 | ORAL_TABLET | Freq: Every day | ORAL | 0 refills | Status: DC

## 2021-02-25 MED ORDER — INV-PROCHLORPERAZINE MALEATE/PLACEBO 10 MG CAP URCC 16070
ORAL_TABLET | Freq: Once | ORAL | Status: AC
  Filled 2021-02-25: qty 1

## 2021-02-25 MED ORDER — FLUCONAZOLE 100 MG PO TABS: 100.0000 mg | ORAL_TABLET | Freq: Every day | ORAL | 0 refills | Status: DC

## 2021-02-25 MED ORDER — SODIUM CHLORIDE 0.9% FLUSH
10.0000 mL | INTRAVENOUS | Status: DC | PRN
  Administered 2021-02-25: 10 mL

## 2021-02-25 MED ORDER — INV-OLANZAPINE/PLACEBO 10 MG CAP URCC 16070 BLISTER CARD 2: 1.0000 | ORAL_CAPSULE | Freq: Every day | ORAL | 0 refills | Status: DC

## 2021-02-25 MED ORDER — SODIUM CHLORIDE 0.9% FLUSH
10.0000 mL | INTRAVENOUS | Status: DC | PRN
  Administered 2021-02-25: 10 mL via INTRAVENOUS

## 2021-02-25 MED ORDER — HEPARIN SOD (PORK) LOCK FLUSH 100 UNIT/ML IV SOLN
500.0000 [IU] | Freq: Once | INTRAVENOUS | Status: AC | PRN
  Administered 2021-02-25: 500 [IU]

## 2021-02-25 MED ORDER — DOXORUBICIN HCL CHEMO IV INJECTION 2 MG/ML
60.0000 mg/m2 | Freq: Once | INTRAVENOUS | Status: AC
  Administered 2021-02-25: 98 mg via INTRAVENOUS
  Filled 2021-02-25: qty 49

## 2021-02-25 MED ORDER — SODIUM CHLORIDE 0.9 % IV SOLN
600.0000 mg/m2 | Freq: Once | INTRAVENOUS | Status: AC
  Administered 2021-02-25: 980 mg via INTRAVENOUS
  Filled 2021-02-25: qty 49

## 2021-02-25 MED ORDER — SODIUM CHLORIDE 0.9 % IV SOLN: Freq: Once | INTRAVENOUS | Status: AC

## 2021-02-25 MED ORDER — INV-PROCHLORPERAZINE MALEATE/PLACEBO 10 MG CAP URCC 16070 BLISTER CARD 2: 1.0000 | ORAL_CAPSULE | Freq: Three times a day (TID) | ORAL | 0 refills | Status: DC

## 2021-02-25 NOTE — Research (Signed)
[id number] - TREATMENT OF REFRACTORY NAUSEA  CYCLE 2 DAY 1   Patient arrives today Unaccompanied for the DAY 1 CYCLE 2 visit.   PROs: Per study protocol, all PROs required for this visit were completed prior to other study activities and completeness has been verified.  Completed online via [employer], verified by this RN.  Provided back up copies of Cycle 2 PROs in case she does not receive email to complete online.  Also provided drug diary and 4 day home record to return in person or via mail after 4 days have been completed.   LABS: Labs collected today for standard of care reviewed, patient ok with MD to proceed with treatment today.  Labs are unrelated to Cycle 1 of study.   MEDICATION REVIEW: Patient reviews and verifies the current medication list is correct.  MD/PROVIDER VISIT: Patient saw Dr Burr Medico per standard of care today before infusion visit.   ADVERSE EVENTS: Patient Pamela Reyes reports resolution of previously reported fatigue and nausea by 02/17/2021.  Patient does not report any other AE's at this time.  MEDICATION ADMINISTRATION:  Received both blister packs from [facility] who confirmed all patient information was correct. Administered study-provided medications at the following times:  -1425- Palonosetron capsule 300 mg PO  -1505-Dexamethasone pills 12 mg PO  -1535-BOTH Prochlorperazine/placebo and olanzapine/placebo 10 mg capsules  1537-Adriamycin administered  Distributed take-home blister pack to patient along with administration instructions including prohibited medications while on study drug.  Patient verbalized understanding of all instructions.  Pt aware to bring or mail back blister pack once completed day 4.  DISPOSITION: Upon completion off all study requirements, patient remained in infusion suite to complete chemotherapy administration.  Pt aware to expect call on Monday (03/01/21) to f/u per protocol.  The patient was thanked for their time and  continued voluntary participation in this study. Patient Pamela Reyes has been provided direct contact information and is encouraged to contact this Nurse for any needs or questions.  Wells Guiles 'Learta CoddingNeysa Bonito, RN, BSN Clinical Research Nurse I 02/25/21 4:13 PM

## 2021-02-25 NOTE — Patient Instructions (Signed)
[facility]  Discharge Instructions: Thank you for choosing [facility] to provide your oncology and hematology care.   If you have a lab appointment with the [facility], please go directly to the [facility] and check in at the registration area.   Wear comfortable clothing and clothing appropriate for easy access to any Portacath or PICC line.   We strive to give you quality time with your provider. You may need to reschedule your appointment if you arrive late (15 or more minutes).  Arriving late affects you and other patients whose appointments are after yours.  Also, if you miss three or more appointments without notifying the office, you may be dismissed from the clinic at the providers discretion.      For prescription refill requests, have your pharmacy contact our office and allow 72 hours for refills to be completed.    Today you received the following chemotherapy and/or immunotherapy agents: Doxorubicin and cytoxan      To help prevent nausea and vomiting after your treatment, we encourage you to take your nausea medication as directed.  BELOW ARE SYMPTOMS THAT SHOULD BE REPORTED IMMEDIATELY: *FEVER GREATER THAN 100.4 F (38 C) OR HIGHER *CHILLS OR SWEATING *NAUSEA AND VOMITING THAT IS NOT CONTROLLED WITH YOUR NAUSEA MEDICATION *UNUSUAL SHORTNESS OF BREATH *UNUSUAL BRUISING OR BLEEDING *URINARY PROBLEMS (pain or burning when urinating, or frequent urination) *BOWEL PROBLEMS (unusual diarrhea, constipation, pain near the anus) TENDERNESS IN MOUTH AND THROAT WITH OR WITHOUT PRESENCE OF ULCERS (sore throat, sores in mouth, or a toothache) UNUSUAL RASH, SWELLING OR PAIN  UNUSUAL VAGINAL DISCHARGE OR ITCHING   Items with * indicate a potential emergency and should be followed up as soon as possible or go to the Emergency Department if any problems should occur.  Please show the CHEMOTHERAPY ALERT CARD or IMMUNOTHERAPY ALERT CARD  at check-in to the Emergency Department and triage nurse.  Should you have questions after your visit or need to cancel or reschedule your appointment, please contact [facility]  Dept: [phone]  and follow the prompts.  Office hours are 8:00 a.m. to 4:30 p.m. Monday - Friday. Please note that voicemails left after 4:00 p.m. may not be returned until the following business day.  We are closed weekends and major holidays. You have access to a nurse at all times for urgent questions. Please call the main number to the clinic Dept: [phone] and follow the prompts.   For any non-urgent questions, you may also contact your provider using MyChart. We now offer e-Visits for anyone 45 and older to request care online for non-urgent symptoms. For details visit [email or web address].   Also download the MyChart app! Go to the app store, search "MyChart", open the app, select [facility], and log in with your MyChart username and password.  Due to Covid, a mask is required upon entering the hospital/clinic. If you do not have a mask, one will be given to you upon arrival. For doctor visits, patients may have 1 support person aged 60 or older with them. For treatment visits, patients cannot have anyone with them due to current Covid guidelines and our immunocompromised population.   Doxorubicin injection What is this medication? DOXORUBICIN (dox oh ROO bi sin) is a chemotherapy drug. It is used to treat many kinds of cancer like leukemia, lymphoma, neuroblastoma, sarcoma, and Wilms' tumor. It is also used to treat bladder cancer, breast cancer, lung cancer, ovarian cancer,  stomach cancer, and thyroid cancer. This medicine may be used for other purposes; ask your health care provider or pharmacist if you have questions. COMMON BRAND NAME(S): Adriamycin, Adriamycin PFS, Adriamycin RDF, Rubex What should I tell my care team before I take this medication? They need to know if  you have any of these conditions: heart disease history of low blood counts caused by a medicine liver disease recent or ongoing radiation therapy an unusual or allergic reaction to doxorubicin, other chemotherapy agents, other medicines, foods, dyes, or preservatives pregnant or trying to get pregnant breast-feeding How should I use this medication? This drug is given as an infusion into a vein. It is administered in a hospital or clinic by a specially trained health care professional. If you have pain, swelling, burning or any unusual feeling around the site of your injection, tell your health care professional right away. Talk to your pediatrician regarding the use of this medicine in children. Special care may be needed. Overdosage: If you think you have taken too much of this medicine contact a poison control center or emergency room at once. NOTE: This medicine is only for you. Do not share this medicine with others. What if I miss a dose? It is important not to miss your dose. Call your doctor or health care professional if you are unable to keep an appointment. What may interact with this medication? This medicine may interact with the following medications: 6-mercaptopurine paclitaxel phenytoin [facility]. John's Wort trastuzumab verapamil This list may not describe all possible interactions. Give your health care provider a list of all the medicines, herbs, non-prescription drugs, or dietary supplements you use. Also tell them if you smoke, drink alcohol, or use illegal drugs. Some items may interact with your medicine. What should I watch for while using this medication? This drug may make you feel generally unwell. This is not uncommon, as chemotherapy can affect healthy cells as well as cancer cells. Report any side effects. Continue your course of treatment even though you feel ill unless your doctor tells you to stop. There is a maximum amount of this medicine you should receive  throughout your life. The amount depends on the medical condition being treated and your overall health. Your doctor will watch how much of this medicine you receive in your lifetime. Tell your doctor if you have taken this medicine before. You may need blood work done while you are taking this medicine. Your urine may turn red for a few days after your dose. This is not blood. If your urine is dark or brown, call your doctor. In some cases, you may be given additional medicines to help with side effects. Follow all directions for their use. Call your doctor or health care professional for advice if you get a fever, chills or sore throat, or other symptoms of a cold or flu. Do not treat yourself. This drug decreases your body's ability to fight infections. Try to avoid being around people who are sick. This medicine may increase your risk to bruise or bleed. Call your doctor or health care professional if you notice any unusual bleeding. Talk to your doctor about your risk of cancer. You may be more at risk for certain types of cancers if you take this medicine. Do not become pregnant while taking this medicine or for 6 months after stopping it. Women should inform their doctor if they wish to become pregnant or think they might be pregnant. Men should not father a child while  taking this medicine and for 6 months after stopping it. There is a potential for serious side effects to an unborn child. Talk to your health care professional or pharmacist for more information. Do not breast-feed an infant while taking this medicine. This medicine has caused ovarian failure in some women and reduced sperm counts in some men This medicine may interfere with the ability to have a child. Talk with your doctor or health care professional if you are concerned about your fertility. This medicine may cause a decrease in Co-Enzyme Q-10. You should make sure that you get enough Co-Enzyme Q-10 while you are taking this  medicine. Discuss the foods you eat and the vitamins you take with your health care professional. What side effects may I notice from receiving this medication? Side effects that you should report to your doctor or health care professional as soon as possible: allergic reactions like skin rash, itching or hives, swelling of the face, lips, or tongue breathing problems chest pain fast or irregular heartbeat low blood counts - this medicine may decrease the number of white blood cells, red blood cells and platelets. You may be at increased risk for infections and bleeding. pain, redness, or irritation at site where injected signs of infection - fever or chills, cough, sore throat, pain or difficulty passing urine signs of decreased platelets or bleeding - bruising, pinpoint red spots on the skin, black, tarry stools, blood in the urine swelling of the ankles, feet, hands tiredness weakness Side effects that usually do not require medical attention (report to your doctor or health care professional if they continue or are bothersome): diarrhea hair loss mouth sores nail discoloration or damage nausea red colored urine vomiting This list may not describe all possible side effects. Call your doctor for medical advice about side effects. You may report side effects to FDA at 1-800-FDA-1088. Where should I keep my medication? This drug is given in a hospital or clinic and will not be stored at home. NOTE: This sheet is a summary. It may not cover all possible information. If you have questions about this medicine, talk to your doctor, pharmacist, or health care provider.  2022 Elsevier/Gold Standard (2016-09-29 00:00:00)  Cyclophosphamide Injection What is this medication? CYCLOPHOSPHAMIDE (sye kloe FOSS fa mide) is a chemotherapy drug. It slows the growth of cancer cells. This medicine is used to treat many types of cancer like lymphoma, myeloma, leukemia, breast cancer, and ovarian cancer,  to name a few. This medicine may be used for other purposes; ask your health care provider or pharmacist if you have questions. COMMON BRAND NAME(S): Cytoxan, Neosar What should I tell my care team before I take this medication? They need to know if you have any of these conditions: heart disease history of irregular heartbeat infection kidney disease liver disease low blood counts, like white cells, platelets, or red blood cells on hemodialysis recent or ongoing radiation therapy scarring or thickening of the lungs trouble passing urine an unusual or allergic reaction to cyclophosphamide, other medicines, foods, dyes, or preservatives pregnant or trying to get pregnant breast-feeding How should I use this medication? This drug is usually given as an injection into a vein or muscle or by infusion into a vein. It is administered in a hospital or clinic by a specially trained health care professional. Talk to your pediatrician regarding the use of this medicine in children. Special care may be needed. Overdosage: If you think you have taken too much of this medicine contact  a poison control center or emergency room at once. NOTE: This medicine is only for you. Do not share this medicine with others. What if I miss a dose? It is important not to miss your dose. Call your doctor or health care professional if you are unable to keep an appointment. What may interact with this medication? amphotericin B azathioprine certain antivirals for HIV or hepatitis certain medicines for blood pressure, heart disease, irregular heart beat certain medicines that treat or prevent blood clots like warfarin certain other medicines for cancer cyclosporine etanercept indomethacin medicines that relax muscles for surgery medicines to increase blood counts metronidazole This list may not describe all possible interactions. Give your health care provider a list of all the medicines, herbs,  non-prescription drugs, or dietary supplements you use. Also tell them if you smoke, drink alcohol, or use illegal drugs. Some items may interact with your medicine. What should I watch for while using this medication? Your condition will be monitored carefully while you are receiving this medicine. You may need blood work done while you are taking this medicine. Drink water or other fluids as directed. Urinate often, even at night. Some products may contain alcohol. Ask your health care professional if this medicine contains alcohol. Be sure to tell all health care professionals you are taking this medicine. Certain medicines, like metronidazole and disulfiram, can cause an unpleasant reaction when taken with alcohol. The reaction includes flushing, headache, nausea, vomiting, sweating, and increased thirst. The reaction can last from 30 minutes to several hours. Do not become pregnant while taking this medicine or for 1 year after stopping it. Women should inform their health care professional if they wish to become pregnant or think they might be pregnant. Men should not father a child while taking this medicine and for 4 months after stopping it. There is potential for serious side effects to an unborn child. Talk to your health care professional for more information. Do not breast-feed an infant while taking this medicine or for 1 week after stopping it. This medicine has caused ovarian failure in some women. This medicine may make it more difficult to get pregnant. Talk to your health care professional if you are concerned about your fertility. This medicine has caused decreased sperm counts in some men. This may make it more difficult to father a child. Talk to your health care professional if you are concerned about your fertility. Call your health care professional for advice if you get a fever, chills, or sore throat, or other symptoms of a cold or flu. Do not treat yourself. This medicine  decreases your body's ability to fight infections. Try to avoid being around people who are sick. Avoid taking medicines that contain aspirin, acetaminophen, ibuprofen, naproxen, or ketoprofen unless instructed by your health care professional. These medicines may hide a fever. Talk to your health care professional about your risk of cancer. You may be more at risk for certain types of cancer if you take this medicine. If you are going to need surgery or other procedure, tell your health care professional that you are using this medicine. Be careful brushing or flossing your teeth or using a toothpick because you may get an infection or bleed more easily. If you have any dental work done, tell your dentist you are receiving this medicine. What side effects may I notice from receiving this medication? Side effects that you should report to your doctor or health care professional as soon as possible: allergic reactions like  skin rash, itching or hives, swelling of the face, lips, or tongue breathing problems nausea, vomiting signs and symptoms of bleeding such as bloody or black, tarry stools; red or dark brown urine; spitting up blood or brown material that looks like coffee grounds; red spots on the skin; unusual bruising or bleeding from the eyes, gums, or nose signs and symptoms of heart failure like fast, irregular heartbeat, sudden weight gain; swelling of the ankles, feet, hands signs and symptoms of infection like fever; chills; cough; sore throat; pain or trouble passing urine signs and symptoms of kidney injury like trouble passing urine or change in the amount of urine signs and symptoms of liver injury like dark yellow or brown urine; general ill feeling or flu-like symptoms; light-colored stools; loss of appetite; nausea; right upper belly pain; unusually weak or tired; yellowing of the eyes or skin Side effects that usually do not require medical attention (report to your doctor or health  care professional if they continue or are bothersome): confusion decreased hearing diarrhea facial flushing hair loss headache loss of appetite missed menstrual periods signs and symptoms of low red blood cells or anemia such as unusually weak or tired; feeling faint or lightheaded; falls skin discoloration This list may not describe all possible side effects. Call your doctor for medical advice about side effects. You may report side effects to FDA at 1-800-FDA-1088. Where should I keep my medication? This drug is given in a hospital or clinic and will not be stored at home. NOTE: This sheet is a summary. It may not cover all possible information. If you have questions about this medicine, talk to your doctor, pharmacist, or health care provider.  2022 Elsevier/Gold Standard (2020-10-13 00:00:00)

## 2021-02-25 NOTE — Progress Notes (Signed)
[place]   Telephone:([phone] Fax:([phone]) [phone]   Clinic Follow up Note   Patient Care Team: Maximiano Coss, NP as PCP - General (Adult Health Nurse Practitioner) Mauro Kaufmann, RN as Oncology Nurse Navigator Rockwell Germany, RN as Oncology Nurse Navigator Stark Klein, MD as Consulting Physician (General Surgery) [name], MD as Consulting Physician (Hematology) Kyung Rudd, MD as Consulting Physician (Radiation Oncology) Estill Dooms, NP as Nurse Practitioner (Obstetrics and Gynecology)  Date of Service:  02/25/2021  CHIEF COMPLAINT: f/u of left breast cancer  CURRENT THERAPY:  Adjuvant AC-T q14d, starting 02/11/21  ASSESSMENT & PLAN:  Pamela Reyes is a 41 y.o. female with   1. 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 The mass has grown significantly since 2017. Biopsy on 11/05/20 confirmed invasive ductal carcinoma, with metastatic involvement of one lymph node. -staging CT CAP and bone scan on 11/19/20 showed no distant metastasis -she underwent left mastectomy on 01/06/21 under Dr. Barry Dienes. Pathology showed 6.5 cm invasive ductal carcinoma, grade 2, involving dermis of nipple without epidermal involvement. Margins were negative, but all 4 lymph nodes showed metastatic disease. -Due to the large tumor and 4 positive lymph nodes, the recommendation is for AC-T. She began chemo on 02/11/21. -baseline echo 02/04/21 is normal, EF 60-65% with normal GLS.  -she tolerated cycle 1 relatively well overall; she experienced intense nausea (without vomiting) and fatigue. She was able to recover well. -Labs reviewed, overall stable, adequate to proceed with treatment today.  2. Vaginal itching -external only, developed about a week after infusion. -she has been using vagisil but  continues to have itching. -I will prescribe diflucan for her.   3. Genetics -she has a strong family history of breast cancer. She reports her sister, who was diagnosed at age 38 and has since recovered, had negative genetic testing. -results were negative.   4. Anxiety and stress -she has been connected with our social worker -additional anxiety from steroid   5. Health maintenance -She had pap/pelvic 02/10/21 -she takes vitamin C, D, B complex, and pre/probiotic for GI health. Ok to continue  -cryotherapy with chemo     PLAN: -proceed with cycle 2 AC today -I called in diflucan for vaginal itchiness  -lab, flush, f/u, and AC every 2 weeks as scheduled for 2 more cycles    No problem-specific Assessment & Plan notes found for this encounter.   SUMMARY OF ONCOLOGIC HISTORY: Oncology History Overview Note   Cancer Staging  Malignant neoplasm of upper-outer quadrant of left breast in female, estrogen receptor positive ([place]) Staging form: Breast, AJCC 8th Edition - Clinical stage from 11/05/2020: Stage IIA (cT2, cN1, cM0, G1, ER+, PR+, HER2: Equivocal) - Signed by [name], MD on 11/10/2020 - Pathologic stage from 01/06/2021: Stage IB (pT3, pN2a, cM0, G2, ER+, PR+, HER2-) - Signed by [name], MD on 01/12/2021     Malignant neoplasm of upper-outer quadrant of left breast in female, estrogen receptor positive ([place])  10/28/2020 Mammogram   Bilateral Diagnostic Mammogram; Left Breast Ultrasound  IMPRESSION: 1. Suspicious palpable mass/hypoechoic area in the left breast at 12 o'clock retroareolar measuring 4.5 cm.   2.  Suspicious lymph node in the left axilla.   11/05/2020 Cancer Staging   Staging form: Breast, AJCC 8th Edition - Clinical stage from 11/05/2020: Stage IIA (cT2, cN1, cM0, G1,  ER+, PR+, HER2: Equivocal) - Signed by [name], MD on 11/10/2020 Stage prefix: Initial diagnosis Histologic grading system: 3 grade system    11/05/2020 Pathology Results   Diagnosis 1.  Breast, left, needle core biopsy, 12 o'clock subareolar, ribbon clip - INVASIVE MAMMARY CARCINOMA. SEE NOTE 1. Carcinoma measures 1.5 cm in greatest linear dimension and appears grade 2. 1. Immunohistochemical stain for E-cadherin is positive in the tumor cells, consistent with a ductal phenotype. 1. PROGNOSTIC INDICATORS Results: The tumor cells are EQUIVOCAL for Her2 (2+). Her2 by FISH will be performed and results reported separately. Estrogen Receptor: 70%, POSITIVE, STRONG-MODERATE STAINING INTENSITY Progesterone Receptor: 80%, POSITIVE, MODERATE STAINING INTENSITY Proliferation Marker Ki67: 1%  2. Lymph node, needle/core biopsy, left axilla, tribell clip - METASTATIC CARCINOMA TO A LYMPH NODE. SEE NOTE 2. Largest contiguous focus of metastatic carcinoma measures 0.3 cm. 2. PROGNOSTIC INDICATORS Results: The tumor cells are EQUIVOCAL for Her2 (2+). Her2 by FISH will be performed and results reported separately. Estrogen Receptor: 90%, POSITIVE, STRONG STAINING INTENSITY Progesterone Receptor: 95%, POSITIVE, STRONG STAINING INTENSITY Proliferation Marker Ki67: 1%   11/10/2020 Initial Diagnosis   Malignant neoplasm of upper-outer quadrant of left breast in female, estrogen receptor positive ([place])   11/19/2020 Imaging   EXAM: CT CHEST, ABDOMEN, AND PELVIS WITH CONTRAST  IMPRESSION: 1. Left breast mass  as previously described. 2. Enhancing subcentimeter left axillary lymph nodes. 3. No evidence of distal metastatic disease. 4. Small hiatal hernia   11/19/2020 Imaging   EXAM: NUCLEAR MEDICINE WHOLE BODY BONE SCAN  IMPRESSION: No evidence of metastatic disease.   11/21/2020 Imaging   EXAM: BILATERAL BREAST MRI WITH AND WITHOUT CONTRAST  IMPRESSION: 1. Suspicious 1.3 centimeter mass in the anterior UPPER central RIGHT breast warranting tissue diagnosis. (Image 65 of series 7). 2. 4 millimeter possible satellite nodule posterior to the 1.3 centimeter mass in the RIGHT  breast. (Image 63 of series 7). 3. Indeterminate oval mass in the UPPER central middle depth of the RIGHT breast warranting tissue diagnosis. (Image 60 of series 7). 4. Large area of enhancement in the central portion of the LEFT breast, involving all quadrants. There is associated enhancement and retraction of the LEFT nipple, with significantly smaller size of the LEFT breast. Enhancement spans at least 6.5 centimeters. 5. Enlarged, previously biopsied LEFT axillary lymph node.   11/24/2020 Genetic Testing   Negative hereditary cancer genetic testing: no pathogenic variants detected in [facility] BRCAPlus STAT Panel or [facility] CustomNext-Cancer +RNAinsight Panel.  The report dates are November 24, 2020 and November 27, 2020, respectively.   The BRCAplus panel offered by [employer] and includes sequencing and deletion/duplication analysis for the following 8 genes: ATM, BRCA1, BRCA2, CDH1, CHEK2, PALB2, PTEN, and TP53.  The CustomNext-Cancer+RNAinsight panel offered by [facility] includes sequencing and rearrangement analysis for the following 47 genes:  APC, ATM, AXIN2, BARD1, BMPR1A, BRCA1, BRCA2, BRIP1, CDH1, CDK4, CDKN2A, CHEK2, DICER1, EPCAM, GREM1, HOXB13, MEN1, MLH1, MSH2, MSH3, MSH6, MUTYH, NBN, NF1, NF2, NTHL1, PALB2, PMS2, POLD1, POLE, PTEN, RAD51C, RAD51D, RECQL, RET, SDHA, SDHAF2, SDHB, SDHC, SDHD, SMAD4, SMARCA4, STK11, TP53, TSC1, TSC2, and VHL.  RNA data is routinely analyzed for use in variant interpretation for all genes.    11/27/2020 Pathology Results   Diagnosis Breast, right, needle core biopsy, anterior upper central - FIBROADENOMATOID AND FIBROCYSTIC CHANGES WITH CALCIFICATIONS - PSEUDOANGIOMATOUS STROMAL HYPERPLASIA - FOCAL PERIDUCTULAR CHRONIC INFLAMMATION - NO MALIGNANCY IDENTIFIED   01/06/2021 Cancer Staging   Staging form: Breast, AJCC 8th Edition - Pathologic stage  from 01/06/2021: Stage IB (pT3, pN2a, cM0, G2, ER+, PR+, HER2-) - Signed by [name], MD on  01/12/2021 Stage prefix: Initial diagnosis Histologic grading system: 3 grade system Residual tumor (R): R0 - None    01/06/2021 Definitive Surgery   FINAL MICROSCOPIC DIAGNOSIS:   A. LYMPH NODE, LEFT AXILLARY, SENTINEL, EXCISION:  - Metastatic carcinoma to a lymph node (1/1)  - Focus of metastatic carcinoma measures 1.5 cm without evidence of extranodal extension   B. 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 BREAST, LEFT INFERIOLATERAL MARGIN, EXCISION:  - Benign fibroadipose tissue  - Negative for carcinoma   D. LYMPH NODE, LEFT AXILLARY #1, SENTINEL, EXCISION:  - Metastatic carcinoma to a lymph node (1/1)  - Focus of metastatic carcinoma measures 1.4 cm without evidence of extranodal extension   E. LYMPH NODE, LEFT AXILLARY #2, SENTINEL, EXCISION:  - Metastatic carcinoma to a lymph node (1/1)  - Focus of metastatic carcinoma measures 0.5 cm without evidence of extranodal extension   F. LYMPH NODE, LEFT AXILLARY #3, SENTINEL, EXCISION:  - Metastatic carcinoma to a lymph node (1/1)  - Focus of metastatic carcinoma measures 0.9 cm without evidence of extranodal extension   G. LYMPH NODE, LEFT AXILLARY #4, SENTINEL, EXCISION:  - Benign fibroadipose tissue, negative for carcinoma  - Lymphoid tissue is not identified    02/11/2021 -  Chemotherapy   Patient is on Treatment Plan : BREAST ADJUVANT DOSE DENSE AC q14d / PACLitaxel q7d        INTERVAL HISTORY:  Pamela Reyes is here for a follow up of breast cancer. She was last seen by me on 02/18/21. She presents to the clinic alone. She reports she has recovered well following treatment. She notes she was almost completely better after about 7 days. She reports continued vaginal itching with some white discharge. She denies rash and  notes the itching is all external.   All other systems were reviewed with the patient and are negative.  MEDICAL HISTORY:  Past Medical History:  Diagnosis Date   Anxiety    Breast cancer ([place])    Family history of breast cancer 11/11/2020   Fibroid 10/27/2020   Has 1 x .9 x .6 cm subserosal fibroid   History of hiatal hernia     SURGICAL HISTORY: Past Surgical History:  Procedure Laterality Date   BREAST RECONSTRUCTION WITH PLACEMENT OF TISSUE EXPANDER AND FLEX HD (ACELLULAR HYDRATED DERMIS) Left 01/06/2021   Procedure: LEFT BREAST RECONSTRUCTION WITH PLACEMENT OF TISSUE EXPANDER AND ACELLULAR  DERMIS;  Surgeon: Irene Limbo, MD;  Location: [facility];  Service: Plastics;  Laterality: Left;   MASTECTOMY W/ SENTINEL NODE BIOPSY Left 01/06/2021   Procedure: LEFT MASTECTOMY WITH MAGTRACE;  Surgeon: Stark Klein, MD;  Location: [facility];  Service: General;  Laterality: Left;   PORTACATH PLACEMENT Right 01/19/2021   Procedure: INSERTION PORT-A-CATH;  Surgeon: Stark Klein, MD;  Location: [facility];  Service: General;  Laterality: Right;   RADIOACTIVE SEED GUIDED AXILLARY SENTINEL LYMPH NODE Left 01/06/2021   Procedure: RADIOACTIVE SEED GUIDED SENTINEL LYMPH NODE BIOPSY;  Surgeon: Stark Klein, MD;  Location: [facility];  Service: General;  Laterality: Left;   SENTINEL NODE BIOPSY Left 01/06/2021   Procedure: LEFT AXILLARY SENTINEL LYMPH  NODE BIOPSY;  Surgeon: Stark Klein,  MD;  Location: [facility];  Service: General;  Laterality: Left;   WISDOM TOOTH EXTRACTION      I have reviewed the social history and family history with the patient and they are unchanged from previous note.  ALLERGIES:  has No Known Allergies.  MEDICATIONS:  Current Outpatient Medications  Medication Sig Dispense Refill   acetaminophen (TYLENOL) 500 MG tablet Take 500-1,000 mg by mouth every 6 (six) hours as needed for moderate pain or headache.     Cholecalciferol (DIALYVITE VITAMIN D 5000) 125 MCG (5000 UT) capsule Take  5,000 Units by mouth daily. Vit D and K supplement combined     dexamethasone (DECADRON) 4 MG tablet Take 1 tablet (4 mg total) by mouth daily. Take daily for 3 days after chemo. Take with food. 20 tablet 0   diphenhydrAMINE (BENADRYL) 25 MG tablet Take 25 mg by mouth at bedtime as needed for sleep.     famotidine (PEPCID) 10 MG tablet Take 10 mg by mouth daily as needed for heartburn or indigestion.     lidocaine-prilocaine (EMLA) cream Apply to affected area once 30 g 3   LORazepam (ATIVAN) 0.5 MG tablet Take 1 tablet (0.5 mg total) by mouth every 6 (six) hours as needed (Nausea or vomiting). 20 tablet 0   ondansetron (ZOFRAN) 8 MG tablet Take 1 tablet (8 mg total) by mouth 2 (two) times daily as needed. Start on the third day after chemotherapy. 30 tablet 1   prochlorperazine (COMPAZINE) 10 MG tablet Take 1 tablet (10 mg total) by mouth every 6 (six) hours as needed (Nausea or vomiting). 30 tablet 1   No current facility-administered medications for this visit.    PHYSICAL EXAMINATION: ECOG PERFORMANCE STATUS: 1 - Symptomatic but completely ambulatory  Vitals:   02/25/21 1338  BP: 121/66  Pulse: 85  Resp: 16  Temp: 98.9 F (37.2 C)  SpO2: 100%   Wt Readings from Last 3 Encounters:  02/25/21 135 lb 8 oz (61.5 kg)  02/18/21 130 lb 3.2 oz (59.1 kg)  02/11/21 132 lb 8 oz (60.1 kg)     GENERAL:alert, no distress and comfortable SKIN: skin color normal, no rashes or significant lesions EYES: normal, Conjunctiva are pink and non-injected, sclera clear  NEURO: alert & oriented x 3 with fluent speech  LABORATORY DATA:  I have reviewed the data as listed CBC Latest Ref Rng & Units 02/25/2021 02/18/2021 02/04/2021  WBC 4.0 - 10.5 K/uL 10.9(H) 3.8(L) 5.6  Hemoglobin 12.0 - 15.0 g/dL 12.3 12.7 14.0  Hematocrit 36.0 - 46.0 % 36.6 36.9 41.8  Platelets 150 - 400 K/uL 252 179 278     CMP Latest Ref Rng & Units 02/18/2021 02/04/2021 11/11/2020  Glucose 70 - 99 mg/dL 86 88 124(H)  BUN 6 -  20 mg/dL '11 9 16  ' Creatinine 0.44 - 1.00 mg/dL 0.52 0.67 0.82  Sodium 135 - 145 mmol/L 138 140 141  Potassium 3.5 - 5.1 mmol/L 4.1 4.1 3.9  Chloride 98 - 111 mmol/L 101 103 107  CO2 22 - 32 mmol/L '30 28 23  ' Calcium 8.9 - 10.3 mg/dL 10.1 10.1 9.3  Total Protein 6.5 - 8.1 g/dL 6.9 7.9 7.3  Total Bilirubin 0.3 - 1.2 mg/dL 0.3 0.5 0.4  Alkaline Phos 38 - 126 U/L 55 33(L) 26(L)  AST 15 - 41 U/L 8(L) 13(L) 13(L)  ALT 0 - 44 U/L '9 12 12      ' RADIOGRAPHIC STUDIES: I have personally reviewed the radiological images as listed  and agreed with the findings in the report. No results found.    No orders of the defined types were placed in this encounter.  All questions were answered. The patient knows to call the clinic with any problems, questions or concerns. No barriers to learning was detected. The total time spent in the appointment was 30 minutes.     [name], MD 02/25/2021   I, Wilburn Mylar, am acting as scribe for [name], MD.   I have reviewed the above documentation for accuracy and completeness, and I agree with the above.

## 2021-02-27 ENCOUNTER — Other Ambulatory Visit: Payer: Self-pay

## 2021-02-27 ENCOUNTER — Inpatient Hospital Stay: Payer: BC Managed Care – PPO

## 2021-02-27 VITALS — BP 131/63 | HR 68 | Temp 97.3°F | Resp 16

## 2021-02-27 DIAGNOSIS — Z170 Estrogen receptor positive status [ER+]: Secondary | ICD-10-CM

## 2021-02-27 DIAGNOSIS — C50412 Malignant neoplasm of upper-outer quadrant of left female breast: Secondary | ICD-10-CM | POA: Diagnosis not present

## 2021-02-27 MED ORDER — PEGFILGRASTIM-BMEZ 6 MG/0.6ML ~~LOC~~ SOSY
6.0000 mg | PREFILLED_SYRINGE | Freq: Once | SUBCUTANEOUS | Status: AC
  Administered 2021-02-27: 6 mg via SUBCUTANEOUS

## 2021-02-28 ENCOUNTER — Encounter: Payer: Self-pay | Admitting: Hematology

## 2021-03-01 ENCOUNTER — Other Ambulatory Visit: Payer: Self-pay | Admitting: Emergency Medicine

## 2021-03-01 ENCOUNTER — Telehealth: Payer: Self-pay | Admitting: Emergency Medicine

## 2021-03-01 NOTE — Telephone Encounter (Signed)
[id number] - TREATMENT OF REFRACTORY NAUSEA  CYCLE 2 DAY 4   Contacted patient today for Day 4 of Cycle 2 f/u phone call, confirmed patient identity using two identifiers.   PROs: Patient states she completed her 4 day home record and drug diary and will bring them in at her next appt to be collected by Research Nurse on 03/11/21.  Pt completed her online surveys via [employer], verified completion.   ADVERSE EVENTS: Patient CARYS MALINA reports moderate to severe fatigue (Grade 2) starting the day after her chemotherapy (02/26/21).  Patient states she did not have any nausea or vomiting since her chemo admin.  Patient does not report any other AE's at this time.  Per MD Burr Medico fatigue is r/t chemo and unrelated to study drug, no action or reporting necessary at this time.  ADVERSE EVENT LOGMICAELLA GITTO [id number]  03/01/2021  [facility] 16070 Cycle 2  Event Grade Onset Date Resolved Date Drug Name Attribution Treatment Comments  Fatigue Grade 2 02/26/2021 Ongoing Olanzapine/placebo Prochlorperazine/placebo Netupitant/palonosetron Unrelated Rest R/t chemo   MEDICATION: Patient reports only have 1 blue pill left, the noon dose for day 1 (patient started her chemo that afternoon).  Patient is aware to bring blister pack back on 03/11/21 appt.  Patient states that she did not have to take any additional medications for any side effects besides study-provided medications.  MD Burr Medico aware.    The patient was thanked for their time and voluntary participation in this study. Patient RYLYN ZAWISTOWSKI has been provided direct contact information and is encouraged to contact this Nurse for any needs or questions.  Wells Guiles 'Learta CoddingNeysa Bonito, RN, BSN Clinical Research Nurse I 03/01/21 12:00 PM

## 2021-03-03 ENCOUNTER — Other Ambulatory Visit: Payer: Self-pay | Admitting: Hematology

## 2021-03-10 MED FILL — Fosaprepitant Dimeglumine For IV Infusion 150 MG (Base Eq): INTRAVENOUS | Qty: 5 | Status: AC

## 2021-03-10 MED FILL — Dexamethasone Sodium Phosphate Inj 100 MG/10ML: INTRAMUSCULAR | Qty: 1 | Status: AC

## 2021-03-11 ENCOUNTER — Other Ambulatory Visit: Payer: Self-pay

## 2021-03-11 ENCOUNTER — Inpatient Hospital Stay: Payer: BC Managed Care – PPO

## 2021-03-11 ENCOUNTER — Telehealth: Payer: Self-pay | Admitting: Emergency Medicine

## 2021-03-11 ENCOUNTER — Encounter: Payer: Self-pay | Admitting: Hematology

## 2021-03-11 ENCOUNTER — Inpatient Hospital Stay: Payer: BC Managed Care – PPO | Attending: Hematology

## 2021-03-11 ENCOUNTER — Inpatient Hospital Stay (HOSPITAL_BASED_OUTPATIENT_CLINIC_OR_DEPARTMENT_OTHER): Payer: BC Managed Care – PPO | Admitting: Hematology

## 2021-03-11 VITALS — BP 128/67 | HR 95 | Temp 98.4°F | Resp 16 | Ht 64.0 in | Wt 136.3 lb

## 2021-03-11 DIAGNOSIS — C773 Secondary and unspecified malignant neoplasm of axilla and upper limb lymph nodes: Secondary | ICD-10-CM | POA: Insufficient documentation

## 2021-03-11 DIAGNOSIS — C50412 Malignant neoplasm of upper-outer quadrant of left female breast: Secondary | ICD-10-CM

## 2021-03-11 DIAGNOSIS — Z5111 Encounter for antineoplastic chemotherapy: Secondary | ICD-10-CM | POA: Diagnosis not present

## 2021-03-11 DIAGNOSIS — Z170 Estrogen receptor positive status [ER+]: Secondary | ICD-10-CM

## 2021-03-11 DIAGNOSIS — G629 Polyneuropathy, unspecified: Secondary | ICD-10-CM | POA: Insufficient documentation

## 2021-03-11 DIAGNOSIS — Z5189 Encounter for other specified aftercare: Secondary | ICD-10-CM | POA: Insufficient documentation

## 2021-03-11 DIAGNOSIS — F419 Anxiety disorder, unspecified: Secondary | ICD-10-CM | POA: Insufficient documentation

## 2021-03-11 DIAGNOSIS — Z803 Family history of malignant neoplasm of breast: Secondary | ICD-10-CM | POA: Diagnosis not present

## 2021-03-11 DIAGNOSIS — Z95828 Presence of other vascular implants and grafts: Secondary | ICD-10-CM

## 2021-03-11 LAB — CBC WITH DIFFERENTIAL (CANCER CENTER ONLY)
Abs Immature Granulocytes: 1.47 10*3/uL — ABNORMAL HIGH (ref 0.00–0.07)
Basophils Absolute: 0.1 10*3/uL (ref 0.0–0.1)
Basophils Relative: 1 %
Eosinophils Absolute: 0 10*3/uL (ref 0.0–0.5)
Eosinophils Relative: 0 %
HCT: 37.2 % (ref 36.0–46.0)
Hemoglobin: 12.5 g/dL (ref 12.0–15.0)
Immature Granulocytes: 12 %
Lymphocytes Relative: 12 %
Lymphs Abs: 1.6 10*3/uL (ref 0.7–4.0)
MCH: 30.5 pg (ref 26.0–34.0)
MCHC: 33.6 g/dL (ref 30.0–36.0)
MCV: 90.7 fL (ref 80.0–100.0)
Monocytes Absolute: 0.8 10*3/uL (ref 0.1–1.0)
Monocytes Relative: 6 %
Neutro Abs: 8.6 10*3/uL — ABNORMAL HIGH (ref 1.7–7.7)
Neutrophils Relative %: 69 %
Platelet Count: 250 10*3/uL (ref 150–400)
RBC: 4.1 MIL/uL (ref 3.87–5.11)
RDW: 14.7 % (ref 11.5–15.5)
Smear Review: NORMAL
WBC Count: 12.6 10*3/uL — ABNORMAL HIGH (ref 4.0–10.5)
nRBC: 0 % (ref 0.0–0.2)

## 2021-03-11 LAB — CMP (CANCER CENTER ONLY)
ALT: 50 U/L — ABNORMAL HIGH (ref 0–44)
AST: 25 U/L (ref 15–41)
Albumin: 4.4 g/dL (ref 3.5–5.0)
Alkaline Phosphatase: 69 U/L (ref 38–126)
Anion gap: 6 (ref 5–15)
BUN: 17 mg/dL (ref 6–20)
CO2: 29 mmol/L (ref 22–32)
Calcium: 9.9 mg/dL (ref 8.9–10.3)
Chloride: 105 mmol/L (ref 98–111)
Creatinine: 0.6 mg/dL (ref 0.44–1.00)
GFR, Estimated: >60 mL/min (ref >60)
Glucose, Bld: 89 mg/dL (ref 70–99)
Potassium: 4.1 mmol/L (ref 3.5–5.1)
Sodium: 140 mmol/L (ref 135–145)
Total Bilirubin: 0.2 mg/dL — ABNORMAL LOW (ref 0.3–1.2)
Total Protein: 7.1 g/dL (ref 6.5–8.1)

## 2021-03-11 MED ORDER — SODIUM CHLORIDE 0.9 % IV SOLN
10.0000 mg | Freq: Once | INTRAVENOUS | Status: AC
  Administered 2021-03-11: 10 mg via INTRAVENOUS
  Filled 2021-03-11: qty 10

## 2021-03-11 MED ORDER — PALONOSETRON HCL INJECTION 0.25 MG/5ML
0.2500 mg | Freq: Once | INTRAVENOUS | Status: AC
  Administered 2021-03-11: 0.25 mg via INTRAVENOUS
  Filled 2021-03-11: qty 5

## 2021-03-11 MED ORDER — SODIUM CHLORIDE 0.9 % IV SOLN
150.0000 mg | Freq: Once | INTRAVENOUS | Status: AC
  Administered 2021-03-11: 150 mg via INTRAVENOUS
  Filled 2021-03-11: qty 150

## 2021-03-11 MED ORDER — DOXORUBICIN HCL CHEMO IV INJECTION 2 MG/ML
60.0000 mg/m2 | Freq: Once | INTRAVENOUS | Status: AC
  Administered 2021-03-11: 98 mg via INTRAVENOUS
  Filled 2021-03-11: qty 49

## 2021-03-11 MED ORDER — SODIUM CHLORIDE 0.9 % IV SOLN
600.0000 mg/m2 | Freq: Once | INTRAVENOUS | Status: AC
  Administered 2021-03-11: 980 mg via INTRAVENOUS
  Filled 2021-03-11: qty 49

## 2021-03-11 MED ORDER — HEPARIN SOD (PORK) LOCK FLUSH 100 UNIT/ML IV SOLN
500.0000 [IU] | Freq: Once | INTRAVENOUS | Status: AC | PRN
  Administered 2021-03-11: 500 [IU]

## 2021-03-11 MED ORDER — SODIUM CHLORIDE 0.9% FLUSH
10.0000 mL | INTRAVENOUS | Status: DC | PRN
  Administered 2021-03-11: 10 mL

## 2021-03-11 MED ORDER — SODIUM CHLORIDE 0.9% FLUSH
10.0000 mL | Freq: Once | INTRAVENOUS | Status: AC
  Administered 2021-03-11: 10 mL via INTRAVENOUS

## 2021-03-11 MED ORDER — SODIUM CHLORIDE 0.9 % IV SOLN: Freq: Once | INTRAVENOUS | Status: AC

## 2021-03-11 NOTE — Telephone Encounter (Signed)
[id number] - TREATMENT OF REFRACTORY NAUSEA  STUDY DRUG AND PAPER FORMS RETURN (CYCLE 2)  Patient returns Cycle 2 study drug today, 1 blue capsule remaining.  Returned study blister pack to pharmacy for destruction, confirmed patient information and drug count with Raul Del, PharmD.  Patient returns Cycle 2 medication diary and 4 day home record today.  Confirmed completion with patient.  Confirmed completion Cycle 2 forms online Haematologist) as well.  Patient denies any questions/concerns at this time.  No further f/u indicated per protocol.  Wells Guiles 'Learta CoddingNeysa Bonito, RN, BSN Clinical Research Nurse I 03/11/21 12:13 PM

## 2021-03-11 NOTE — Patient Instructions (Signed)
[facility]  Discharge Instructions: Thank you for choosing [facility] to provide your oncology and hematology care.   If you have a lab appointment with the [facility], please go directly to the [facility] and check in at the registration area.   Wear comfortable clothing and clothing appropriate for easy access to any Portacath or PICC line.   We strive to give you quality time with your provider. You may need to reschedule your appointment if you arrive late (15 or more minutes).  Arriving late affects you and other patients whose appointments are after yours.  Also, if you miss three or more appointments without notifying the office, you may be dismissed from the clinic at the providers discretion.      For prescription refill requests, have your pharmacy contact our office and allow 72 hours for refills to be completed.    Today you received the following chemotherapy and/or immunotherapy agents doxorubicin, cytoxan       To help prevent nausea and vomiting after your treatment, we encourage you to take your nausea medication as directed.  BELOW ARE SYMPTOMS THAT SHOULD BE REPORTED IMMEDIATELY: *FEVER GREATER THAN 100.4 F (38 C) OR HIGHER *CHILLS OR SWEATING *NAUSEA AND VOMITING THAT IS NOT CONTROLLED WITH YOUR NAUSEA MEDICATION *UNUSUAL SHORTNESS OF BREATH *UNUSUAL BRUISING OR BLEEDING *URINARY PROBLEMS (pain or burning when urinating, or frequent urination) *BOWEL PROBLEMS (unusual diarrhea, constipation, pain near the anus) TENDERNESS IN MOUTH AND THROAT WITH OR WITHOUT PRESENCE OF ULCERS (sore throat, sores in mouth, or a toothache) UNUSUAL RASH, SWELLING OR PAIN  UNUSUAL VAGINAL DISCHARGE OR ITCHING   Items with * indicate a potential emergency and should be followed up as soon as possible or go to the Emergency Department if any problems should occur.  Please show the CHEMOTHERAPY ALERT CARD or IMMUNOTHERAPY ALERT CARD at  check-in to the Emergency Department and triage nurse.  Should you have questions after your visit or need to cancel or reschedule your appointment, please contact [facility]  Dept: [phone]  and follow the prompts.  Office hours are 8:00 a.m. to 4:30 p.m. Monday - Friday. Please note that voicemails left after 4:00 p.m. may not be returned until the following business day.  We are closed weekends and major holidays. You have access to a nurse at all times for urgent questions. Please call the main number to the clinic Dept: [phone] and follow the prompts.   For any non-urgent questions, you may also contact your provider using MyChart. We now offer e-Visits for anyone 38 and older to request care online for non-urgent symptoms. For details visit [email or web address].   Also download the MyChart app! Go to the app store, search "MyChart", open the app, select [facility], and log in with your MyChart username and password.  Due to Covid, a mask is required upon entering the hospital/clinic. If you do not have a mask, one will be given to you upon arrival. For doctor visits, patients may have 1 support person aged 40 or older with them. For treatment visits, patients cannot have anyone with them due to current Covid guidelines and our immunocompromised population.

## 2021-03-11 NOTE — Progress Notes (Addendum)
[place]   [phone] [phone]   Clinic Follow up Note   Patient Care Team: Maximiano Coss, NP as PCP - General (Adult Health Nurse Practitioner) Mauro Kaufmann, RN as Oncology Nurse Navigator Rockwell Germany, RN as Oncology Nurse Navigator Stark Klein, MD as Consulting Physician (General Surgery) [name], MD as Consulting Physician (Hematology) Kyung Rudd, MD as Consulting Physician (Radiation Oncology) Estill Dooms, NP as Nurse Practitioner (Obstetrics and Gynecology)  Date of Service:  03/11/2021  CHIEF COMPLAINT: f/u of left breast cancer  CURRENT THERAPY:  Adjuvant AC-T q14d, starting 02/11/21  ASSESSMENT & PLAN:  Pamela Reyes is a 41 y.o. female with   1. 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 The mass has grown significantly since 2017. Biopsy on 11/05/20 confirmed invasive ductal carcinoma, with metastatic involvement of one lymph node. -staging CT CAP and bone scan on 11/19/20 showed no distant metastasis -she underwent left mastectomy on 01/06/21 under Dr. Barry Dienes. Pathology showed 6.5 cm invasive ductal carcinoma, grade 2, involving dermis of nipple without epidermal involvement. Margins were negative, but all 4 lymph nodes showed metastatic disease. -Due to the large tumor and 4 positive lymph nodes, the recommendation is for AC-T. She began chemo on 02/11/21. -she tolerated cycle 1 relatively well overall; she experienced intense nausea (without vomiting) and fatigue. She was able to recover well. She tolerated cycle 2 overall much better with no nausea. -Labs reviewed, overall stable, adequate to proceed with treatment today.  2. Peripheral Neuropathy, G1 -present prior to breast cancer diagnosis. -possibly alcohol-related from prior use,  discontinued alcohol with cancer diagnosis. -very mild, present intermittently and predominantly in her left hand and foot -I recommend her to start B complex and folic acid supplement  -will monitor on chemo   3. Genetics -she has a strong family history of breast cancer. She reports her sister, who was diagnosed at age 49 and has since recovered, had negative genetic testing. -results were negative.   4. Anxiety and stress -she has been connected with our social worker -additional anxiety from steroid   5. Health maintenance -She had pap/pelvic 02/10/21 -she takes vitamin C, D, B complex, and pre/probiotic for GI health. Ok to continue      PLAN: -proceed with cycle 3 AC today -lab, flush, f/u, and AC in 2 weeks as scheduled -will scheduled her weekly Taxol starting in 4 weeks  -she is on [id number] trial for refractory nausea, followed by research nurse   No problem-specific Assessment & Plan notes found for this encounter.   SUMMARY OF ONCOLOGIC HISTORY: Oncology History Overview Note   Cancer Staging  Malignant neoplasm of upper-outer quadrant of left breast in female, estrogen receptor positive ([place]) Staging form: Breast, AJCC 8th Edition - Clinical stage from 11/05/2020: Stage IIA (cT2, cN1, cM0, G1, ER+, PR+, HER2: Equivocal) - Signed by [name], MD on 11/10/2020 - Pathologic stage from 01/06/2021: Stage IB (pT3, pN2a, cM0, G2, ER+, PR+, HER2-) - Signed by [name], MD on 01/12/2021     Malignant neoplasm of upper-outer quadrant of left breast in female, estrogen receptor positive ([place])  10/28/2020 Mammogram   Bilateral Diagnostic Mammogram; Left Breast Ultrasound  IMPRESSION: 1. Suspicious palpable mass/hypoechoic area in the left breast at 12 o'clock retroareolar measuring 4.5 cm.   2.  Suspicious lymph node in the  left axilla.   11/05/2020 Cancer Staging   Staging form: Breast, AJCC 8th Edition - Clinical stage from 11/05/2020: Stage IIA (cT2, cN1, cM0, G1, ER+,  PR+, HER2: Equivocal) - Signed by [name], MD on 11/10/2020 Stage prefix: Initial diagnosis Histologic grading system: 3 grade system    11/05/2020 Pathology Results   Diagnosis 1. Breast, left, needle core biopsy, 12 o'clock subareolar, ribbon clip - INVASIVE MAMMARY CARCINOMA. SEE NOTE 1. Carcinoma measures 1.5 cm in greatest linear dimension and appears grade 2. 1. Immunohistochemical stain for E-cadherin is positive in the tumor cells, consistent with a ductal phenotype. 1. PROGNOSTIC INDICATORS Results: The tumor cells are EQUIVOCAL for Her2 (2+). Her2 by FISH will be performed and results reported separately. Estrogen Receptor: 70%, POSITIVE, STRONG-MODERATE STAINING INTENSITY Progesterone Receptor: 80%, POSITIVE, MODERATE STAINING INTENSITY Proliferation Marker Ki67: 1%  2. Lymph node, needle/core biopsy, left axilla, tribell clip - METASTATIC CARCINOMA TO A LYMPH NODE. SEE NOTE 2. Largest contiguous focus of metastatic carcinoma measures 0.3 cm. 2. PROGNOSTIC INDICATORS Results: The tumor cells are EQUIVOCAL for Her2 (2+). Her2 by FISH will be performed and results reported separately. Estrogen Receptor: 90%, POSITIVE, STRONG STAINING INTENSITY Progesterone Receptor: 95%, POSITIVE, STRONG STAINING INTENSITY Proliferation Marker Ki67: 1%   11/10/2020 Initial Diagnosis   Malignant neoplasm of upper-outer quadrant of left breast in female, estrogen receptor positive ([place])   11/19/2020 Imaging   EXAM: CT CHEST, ABDOMEN, AND PELVIS WITH CONTRAST  IMPRESSION: 1. Left breast mass  as previously described. 2. Enhancing subcentimeter left axillary lymph nodes. 3. No evidence of distal metastatic disease. 4. Small hiatal hernia   11/19/2020 Imaging   EXAM: NUCLEAR MEDICINE WHOLE BODY BONE SCAN  IMPRESSION: No evidence of metastatic disease.   11/21/2020 Imaging   EXAM: BILATERAL BREAST MRI WITH AND WITHOUT CONTRAST  IMPRESSION: 1. Suspicious 1.3 centimeter mass in  the anterior UPPER central RIGHT breast warranting tissue diagnosis. (Image 65 of series 7). 2. 4 millimeter possible satellite nodule posterior to the 1.3 centimeter mass in the RIGHT breast. (Image 63 of series 7). 3. Indeterminate oval mass in the UPPER central middle depth of the RIGHT breast warranting tissue diagnosis. (Image 60 of series 7). 4. Large area of enhancement in the central portion of the LEFT breast, involving all quadrants. There is associated enhancement and retraction of the LEFT nipple, with significantly smaller size of the LEFT breast. Enhancement spans at least 6.5 centimeters. 5. Enlarged, previously biopsied LEFT axillary lymph node.   11/24/2020 Genetic Testing   Negative hereditary cancer genetic testing: no pathogenic variants detected in [facility] BRCAPlus STAT Panel or [facility] CustomNext-Cancer +RNAinsight Panel.  The report dates are November 24, 2020 and November 27, 2020, respectively.   The BRCAplus panel offered by [employer] and includes sequencing and deletion/duplication analysis for the following 8 genes: ATM, BRCA1, BRCA2, CDH1, CHEK2, PALB2, PTEN, and TP53.  The CustomNext-Cancer+RNAinsight panel offered by [facility] includes sequencing and rearrangement analysis for the following 47 genes:  APC, ATM, AXIN2, BARD1, BMPR1A, BRCA1, BRCA2, BRIP1, CDH1, CDK4, CDKN2A, CHEK2, DICER1, EPCAM, GREM1, HOXB13, MEN1, MLH1, MSH2, MSH3, MSH6, MUTYH, NBN, NF1, NF2, NTHL1, PALB2, PMS2, POLD1, POLE, PTEN, RAD51C, RAD51D, RECQL, RET, SDHA, SDHAF2, SDHB, SDHC, SDHD, SMAD4, SMARCA4, STK11, TP53, TSC1, TSC2, and VHL.  RNA data is routinely analyzed for use in variant interpretation for all genes.    11/27/2020 Pathology Results   Diagnosis Breast, right, needle core biopsy, anterior upper central - FIBROADENOMATOID AND FIBROCYSTIC CHANGES WITH CALCIFICATIONS - PSEUDOANGIOMATOUS STROMAL  HYPERPLASIA - FOCAL PERIDUCTULAR CHRONIC INFLAMMATION - NO MALIGNANCY IDENTIFIED    01/06/2021 Cancer Staging   Staging form: Breast, AJCC 8th Edition - Pathologic stage from 01/06/2021: Stage IB (pT3, pN2a, cM0, G2, ER+, PR+, HER2-) - Signed by [name], MD on 01/12/2021 Stage prefix: Initial diagnosis Histologic grading system: 3 grade system Residual tumor (R): R0 - None    01/06/2021 Definitive Surgery   FINAL MICROSCOPIC DIAGNOSIS:   A. LYMPH NODE, LEFT AXILLARY, SENTINEL, EXCISION:  - Metastatic carcinoma to a lymph node (1/1)  - Focus of metastatic carcinoma measures 1.5 cm without evidence of extranodal extension   B. 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 BREAST, LEFT INFERIOLATERAL MARGIN, EXCISION:  - Benign fibroadipose tissue  - Negative for carcinoma   D. LYMPH NODE, LEFT AXILLARY #1, SENTINEL, EXCISION:  - Metastatic carcinoma to a lymph node (1/1)  - Focus of metastatic carcinoma measures 1.4 cm without evidence of extranodal extension   E. LYMPH NODE, LEFT AXILLARY #2, SENTINEL, EXCISION:  - Metastatic carcinoma to a lymph node (1/1)  - Focus of metastatic carcinoma measures 0.5 cm without evidence of extranodal extension   F. LYMPH NODE, LEFT AXILLARY #3, SENTINEL, EXCISION:  - Metastatic carcinoma to a lymph node (1/1)  - Focus of metastatic carcinoma measures 0.9 cm without evidence of extranodal extension   G. LYMPH NODE, LEFT AXILLARY #4, SENTINEL, EXCISION:  - Benign fibroadipose tissue, negative for carcinoma  - Lymphoid tissue is not identified    02/11/2021 -  Chemotherapy   Patient is on Treatment Plan : BREAST ADJUVANT DOSE DENSE AC q14d / PACLitaxel q7d        INTERVAL HISTORY:  Pamela Reyes is here for a follow up of breast cancer. She was last seen by me on 02/25/21. She presents to the clinic alone. Research nurse Learta Codding was  present during our visit. She reports she had no nausea with her last cycle. She also reports intermittent neuropathy to her hands and feet (mainly her left side) that was present prior to her breast cancer diagnosis. She denies any function deficits.   All other systems were reviewed with the patient and are negative.  MEDICAL HISTORY:  Past Medical History:  Diagnosis Date   Anxiety    Breast cancer ([place])    Family history of breast cancer 11/11/2020   Fibroid 10/27/2020   Has 1 x .9 x .6 cm subserosal fibroid   History of hiatal hernia     SURGICAL HISTORY: Past Surgical History:  Procedure Laterality Date   BREAST RECONSTRUCTION WITH PLACEMENT OF TISSUE EXPANDER AND FLEX HD (ACELLULAR HYDRATED DERMIS) Left 01/06/2021   Procedure: LEFT BREAST RECONSTRUCTION WITH PLACEMENT OF TISSUE EXPANDER AND ACELLULAR  DERMIS;  Surgeon: Irene Limbo, MD;  Location: [facility];  Service: Plastics;  Laterality: Left;   MASTECTOMY W/ SENTINEL NODE BIOPSY Left 01/06/2021   Procedure: LEFT MASTECTOMY WITH MAGTRACE;  Surgeon: Stark Klein, MD;  Location: [facility];  Service: General;  Laterality: Left;   PORTACATH PLACEMENT Right 01/19/2021   Procedure: INSERTION PORT-A-CATH;  Surgeon: Stark Klein, MD;  Location: [facility];  Service: General;  Laterality: Right;   RADIOACTIVE SEED GUIDED AXILLARY SENTINEL LYMPH NODE Left 01/06/2021   Procedure: RADIOACTIVE SEED GUIDED SENTINEL LYMPH NODE BIOPSY;  Surgeon:  Stark Klein, MD;  Location: [facility];  Service: General;  Laterality: Left;   SENTINEL NODE BIOPSY Left 01/06/2021   Procedure: LEFT AXILLARY SENTINEL LYMPH  NODE BIOPSY;  Surgeon: Stark Klein, MD;  Location: [facility];  Service: General;  Laterality: Left;   WISDOM TOOTH EXTRACTION      I have reviewed the social history and family history with the patient and they are unchanged from previous note.  ALLERGIES:  has No Known Allergies.  MEDICATIONS:  Current Outpatient Medications  Medication Sig  Dispense Refill   acetaminophen (TYLENOL) 500 MG tablet Take 500-1,000 mg by mouth every 6 (six) hours as needed for moderate pain or headache.     Cholecalciferol (DIALYVITE VITAMIN D 5000) 125 MCG (5000 UT) capsule Take 5,000 Units by mouth daily. Vit D and K supplement combined     dexamethasone (DECADRON) 4 MG tablet Take 1 tablet (4 mg total) by mouth daily. Take daily for 3 days after chemo. Take with food. 20 tablet 0   diphenhydrAMINE (BENADRYL) 25 MG tablet Take 25 mg by mouth at bedtime as needed for sleep.     famotidine (PEPCID) 10 MG tablet Take 10 mg by mouth daily as needed for heartburn or indigestion.     fluconazole (DIFLUCAN) 100 MG tablet Take 1 tablet (100 mg total) by mouth daily. 1 tablet 0   lidocaine-prilocaine (EMLA) cream Apply to affected area once 30 g 3   loratadine (CLARITIN) 10 MG tablet Take 10 mg by mouth daily.     LORazepam (ATIVAN) 0.5 MG tablet Take 1 tablet (0.5 mg total) by mouth every 6 (six) hours as needed (Nausea or vomiting). 20 tablet 0   ondansetron (ZOFRAN) 8 MG tablet Take 1 tablet (8 mg total) by mouth 2 (two) times daily as needed. Start on the third day after chemotherapy. 30 tablet 1   prochlorperazine (COMPAZINE) 10 MG tablet Take 1 tablet (10 mg total) by mouth every 6 (six) hours as needed (Nausea or vomiting). 30 tablet 1   No current facility-administered medications for this visit.    PHYSICAL EXAMINATION: ECOG PERFORMANCE STATUS: 1 - Symptomatic but completely ambulatory  Vitals:   03/11/21 1021  BP: 128/67  Pulse: 95  Resp: 16  Temp: 98.4 F (36.9 C)  SpO2: 100%   Wt Readings from Last 3 Encounters:  03/11/21 136 lb 4.8 oz (61.8 kg)  02/25/21 135 lb 8 oz (61.5 kg)  02/18/21 130 lb 3.2 oz (59.1 kg)     GENERAL:alert, no distress and comfortable SKIN: skin color normal, no rashes or significant lesions EYES: normal, Conjunctiva are pink and non-injected, sclera clear  NEURO: alert & oriented x 3 with fluent  speech  LABORATORY DATA:  I have reviewed the data as listed CBC Latest Ref Rng & Units 03/11/2021 02/25/2021 02/18/2021  WBC 4.0 - 10.5 K/uL 12.6(H) 10.9(H) 3.8(L)  Hemoglobin 12.0 - 15.0 g/dL 12.5 12.3 12.7  Hematocrit 36.0 - 46.0 % 37.2 36.6 36.9  Platelets 150 - 400 K/uL 250 252 179     CMP Latest Ref Rng & Units 03/11/2021 02/25/2021 02/18/2021  Glucose 70 - 99 mg/dL 89 86 86  BUN 6 - 20 mg/dL _0 Creatinine 0.44 - 1.00 mg/dL 0.60 0.63 0.52  Sodium 135 - 145 mmol/L 140 139 138  Potassium 3.5 - 5.1 mmol/L 4.1 4.1 4.1  Chloride 98 - 111 mmol/L 105 106 101  CO2 22 - 32 mmol/L _1 Calcium 8.9 - 10.3  mg/dL 9.9 9.6 10.1  Total Protein 6.5 - 8.1 g/dL 7.1 7.0 6.9  Total Bilirubin 0.3 - 1.2 mg/dL 0.2(L) 0.2(L) 0.3  Alkaline Phos 38 - 126 U/L 69 55 55  AST 15 - 41 U/L 25 16 8(L)  ALT 0 - 44 U/L 50(H) 29 9      RADIOGRAPHIC STUDIES: I have personally reviewed the radiological images as listed and agreed with the findings in the report. No results found.    Orders Placed This Encounter  Procedures   Vitamin B12    Standing Status:   Future    Standing Expiration Date:   03/11/2022   Methylmalonic acid, serum    Standing Status:   Future    Standing Expiration Date:   03/11/2022   All questions were answered. The patient knows to call the clinic with any problems, questions or concerns. No barriers to learning was detected. The total time spent in the appointment was 30 minutes.     [name], MD 03/11/2021   I, Wilburn Mylar, am acting as scribe for [name], MD.   I have reviewed the above documentation for accuracy and completeness, and I agree with the above.

## 2021-03-12 ENCOUNTER — Other Ambulatory Visit: Payer: Self-pay

## 2021-03-12 ENCOUNTER — Telehealth: Payer: Self-pay | Admitting: Hematology

## 2021-03-12 NOTE — Telephone Encounter (Signed)
Scheduled follow-up appointments per 2/2 los. Patient is aware. °

## 2021-03-13 ENCOUNTER — Inpatient Hospital Stay: Payer: BC Managed Care – PPO

## 2021-03-13 ENCOUNTER — Other Ambulatory Visit: Payer: Self-pay

## 2021-03-13 VITALS — BP 128/63 | HR 68 | Temp 98.1°F | Resp 16 | Ht 64.0 in

## 2021-03-13 DIAGNOSIS — Z170 Estrogen receptor positive status [ER+]: Secondary | ICD-10-CM

## 2021-03-13 DIAGNOSIS — C50412 Malignant neoplasm of upper-outer quadrant of left female breast: Secondary | ICD-10-CM

## 2021-03-13 DIAGNOSIS — Z5111 Encounter for antineoplastic chemotherapy: Secondary | ICD-10-CM | POA: Diagnosis not present

## 2021-03-13 MED ORDER — PEGFILGRASTIM-BMEZ 6 MG/0.6ML ~~LOC~~ SOSY
6.0000 mg | PREFILLED_SYRINGE | Freq: Once | SUBCUTANEOUS | Status: AC
  Administered 2021-03-13: 6 mg via SUBCUTANEOUS
  Filled 2021-03-13: qty 0.6

## 2021-03-13 NOTE — Patient Instructions (Signed)

## 2021-03-24 MED FILL — Fosaprepitant Dimeglumine For IV Infusion 150 MG (Base Eq): INTRAVENOUS | Qty: 5 | Status: AC

## 2021-03-24 MED FILL — Dexamethasone Sodium Phosphate Inj 100 MG/10ML: INTRAMUSCULAR | Qty: 1 | Status: AC

## 2021-03-25 ENCOUNTER — Inpatient Hospital Stay: Payer: BC Managed Care – PPO

## 2021-03-25 ENCOUNTER — Other Ambulatory Visit: Payer: Self-pay

## 2021-03-25 ENCOUNTER — Inpatient Hospital Stay (HOSPITAL_BASED_OUTPATIENT_CLINIC_OR_DEPARTMENT_OTHER): Payer: BC Managed Care – PPO | Admitting: Hematology

## 2021-03-25 ENCOUNTER — Encounter: Payer: Self-pay | Admitting: Hematology

## 2021-03-25 ENCOUNTER — Encounter: Payer: Self-pay | Admitting: *Deleted

## 2021-03-25 VITALS — BP 106/73 | HR 94 | Temp 99.0°F | Resp 18 | Ht 64.0 in | Wt 139.1 lb

## 2021-03-25 DIAGNOSIS — Z95828 Presence of other vascular implants and grafts: Secondary | ICD-10-CM

## 2021-03-25 DIAGNOSIS — Z170 Estrogen receptor positive status [ER+]: Secondary | ICD-10-CM

## 2021-03-25 DIAGNOSIS — G629 Polyneuropathy, unspecified: Secondary | ICD-10-CM

## 2021-03-25 DIAGNOSIS — C50412 Malignant neoplasm of upper-outer quadrant of left female breast: Secondary | ICD-10-CM

## 2021-03-25 DIAGNOSIS — Z5111 Encounter for antineoplastic chemotherapy: Secondary | ICD-10-CM | POA: Diagnosis not present

## 2021-03-25 HISTORY — DX: Presence of other vascular implants and grafts: Z95.828

## 2021-03-25 LAB — CBC WITH DIFFERENTIAL (CANCER CENTER ONLY)
Abs Immature Granulocytes: 2.41 10*3/uL — ABNORMAL HIGH (ref 0.00–0.07)
Basophils Absolute: 0 10*3/uL (ref 0.0–0.1)
Basophils Relative: 0 %
Eosinophils Absolute: 0 10*3/uL (ref 0.0–0.5)
Eosinophils Relative: 0 %
HCT: 35.6 % — ABNORMAL LOW (ref 36.0–46.0)
Hemoglobin: 12.2 g/dL (ref 12.0–15.0)
Immature Granulocytes: 16 %
Lymphocytes Relative: 10 %
Lymphs Abs: 1.5 10*3/uL (ref 0.7–4.0)
MCH: 31.5 pg (ref 26.0–34.0)
MCHC: 34.3 g/dL (ref 30.0–36.0)
MCV: 92 fL (ref 80.0–100.0)
Monocytes Absolute: 1.3 10*3/uL — ABNORMAL HIGH (ref 0.1–1.0)
Monocytes Relative: 8 %
Neutro Abs: 10.2 10*3/uL — ABNORMAL HIGH (ref 1.7–7.7)
Neutrophils Relative %: 66 %
Platelet Count: 253 10*3/uL (ref 150–400)
RBC: 3.87 MIL/uL (ref 3.87–5.11)
RDW: 15.7 % — ABNORMAL HIGH (ref 11.5–15.5)
Smear Review: NORMAL
WBC Count: 15.4 10*3/uL — ABNORMAL HIGH (ref 4.0–10.5)
nRBC: 0 % (ref 0.0–0.2)

## 2021-03-25 LAB — CMP (CANCER CENTER ONLY)
ALT: 21 U/L (ref 0–44)
AST: 18 U/L (ref 15–41)
Albumin: 4.3 g/dL (ref 3.5–5.0)
Alkaline Phosphatase: 75 U/L (ref 38–126)
Anion gap: 6 (ref 5–15)
BUN: 21 mg/dL — ABNORMAL HIGH (ref 6–20)
CO2: 28 mmol/L (ref 22–32)
Calcium: 9.5 mg/dL (ref 8.9–10.3)
Chloride: 104 mmol/L (ref 98–111)
Creatinine: 0.59 mg/dL (ref 0.44–1.00)
GFR, Estimated: >60 mL/min (ref >60)
Glucose, Bld: 84 mg/dL (ref 70–99)
Potassium: 4 mmol/L (ref 3.5–5.1)
Sodium: 138 mmol/L (ref 135–145)
Total Bilirubin: 0.3 mg/dL (ref 0.3–1.2)
Total Protein: 7 g/dL (ref 6.5–8.1)

## 2021-03-25 LAB — VITAMIN B12: Vitamin B-12: 4970 pg/mL — ABNORMAL HIGH (ref 180–914)

## 2021-03-25 MED ORDER — SODIUM CHLORIDE 0.9 % IV SOLN
600.0000 mg/m2 | Freq: Once | INTRAVENOUS | Status: AC
  Administered 2021-03-25: 980 mg via INTRAVENOUS
  Filled 2021-03-25: qty 49

## 2021-03-25 MED ORDER — SODIUM CHLORIDE 0.9% FLUSH
10.0000 mL | Freq: Once | INTRAVENOUS | Status: AC
  Administered 2021-03-25: 10 mL

## 2021-03-25 MED ORDER — SODIUM CHLORIDE 0.9 % IV SOLN
10.0000 mg | Freq: Once | INTRAVENOUS | Status: AC
  Administered 2021-03-25: 10 mg via INTRAVENOUS
  Filled 2021-03-25: qty 10

## 2021-03-25 MED ORDER — DOXORUBICIN HCL CHEMO IV INJECTION 2 MG/ML
60.0000 mg/m2 | Freq: Once | INTRAVENOUS | Status: AC
  Administered 2021-03-25: 98 mg via INTRAVENOUS
  Filled 2021-03-25: qty 49

## 2021-03-25 MED ORDER — SODIUM CHLORIDE 0.9% FLUSH
10.0000 mL | INTRAVENOUS | Status: DC | PRN
  Administered 2021-03-25: 10 mL

## 2021-03-25 MED ORDER — PALONOSETRON HCL INJECTION 0.25 MG/5ML
0.2500 mg | Freq: Once | INTRAVENOUS | Status: AC
  Administered 2021-03-25: 0.25 mg via INTRAVENOUS
  Filled 2021-03-25: qty 5

## 2021-03-25 MED ORDER — SODIUM CHLORIDE 0.9 % IV SOLN
150.0000 mg | Freq: Once | INTRAVENOUS | Status: AC
  Administered 2021-03-25: 150 mg via INTRAVENOUS
  Filled 2021-03-25: qty 150

## 2021-03-25 MED ORDER — SODIUM CHLORIDE 0.9 % IV SOLN: Freq: Once | INTRAVENOUS | Status: AC

## 2021-03-25 MED ORDER — HEPARIN SOD (PORK) LOCK FLUSH 100 UNIT/ML IV SOLN
500.0000 [IU] | Freq: Once | INTRAVENOUS | Status: AC | PRN
  Administered 2021-03-25: 500 [IU]

## 2021-03-25 NOTE — Patient Instructions (Signed)
[facility] MEDICAL [facility]COLOGY  Discharge Instructions: °Thank you for choosing [facility] to provide your oncology and hematology care.  ° °If you have a lab appointment with the Cancer Center, please go directly to the Cancer Center and check in at the registration area. °  °Wear comfortable clothing and clothing appropriate for easy access to any Portacath or PICC line.  ° °We strive to give you quality time with your provider. You may need to reschedule your appointment if you arrive late (15 or more minutes).  Arriving late affects you and other patients whose appointments are after yours.  Also, if you miss three or more appointments without notifying the office, you may be dismissed from the clinic at the provider’s discretion.    °  °For prescription refill requests, have your pharmacy contact our office and allow 72 hours for refills to be completed.   ° °Today you received the following chemotherapy and/or immunotherapy agents: doxorubicin and  cyclophosphamide   °  °To help prevent nausea and vomiting after your treatment, we encourage you to take your nausea medication as directed. ° °BELOW ARE SYMPTOMS THAT SHOULD BE REPORTED IMMEDIATELY: °*FEVER GREATER THAN 100.4 F (38 °C) OR HIGHER °*CHILLS OR SWEATING °*NAUSEA AND VOMITING THAT IS NOT CONTROLLED WITH YOUR NAUSEA MEDICATION °*UNUSUAL SHORTNESS OF BREATH °*UNUSUAL BRUISING OR BLEEDING °*URINARY PROBLEMS (pain or burning when urinating, or frequent urination) °*BOWEL PROBLEMS (unusual diarrhea, constipation, pain near the anus) °TENDERNESS IN MOUTH AND THROAT WITH OR WITHOUT PRESENCE OF ULCERS (sore throat, sores in mouth, or a toothache) °UNUSUAL RASH, SWELLING OR PAIN  °UNUSUAL VAGINAL DISCHARGE OR ITCHING  ° °Items with * indicate a potential emergency and should be followed up as soon as possible or go to the Emergency Department if any problems should occur. ° °Please show the CHEMOTHERAPY ALERT CARD or IMMUNOTHERAPY  ALERT CARD at check-in to the Emergency Department and triage nurse. ° °Should you have questions after your visit or need to cancel or reschedule your appointment, please contact [facility]  Dept: [phone]  and follow the prompts.  Office hours are 8:00 a.m. to 4:30 p.m. Monday - Friday. Please note that voicemails left after 4:00 p.m. may not be returned until the following business day.  We are closed weekends and major holidays. You have access to a nurse at all times for urgent questions. Please call the main number to the clinic Dept: [phone] and follow the prompts. ° ° °For any non-urgent questions, you may also contact your provider using MyChart. We now offer e-Visits for anyone 18 and older to request care online for non-urgent symptoms. For details visit [email or web address]. °  °Also download the MyChart app! Go to the app store, search "MyChart", open the app, select [facility], and log in with your MyChart username and password. ° °Due to Covid, a mask is required upon entering the hospital/clinic. If you do not have a mask, one will be given to you upon arrival. For doctor visits, patients may have 1 support person aged 18 or older with them. For treatment visits, patients cannot have anyone with them due to current Covid guidelines and our immunocompromised population.  ° °

## 2021-03-25 NOTE — Progress Notes (Signed)
[place]   [phone] [phone]   Clinic Follow up Note   Patient Care Team: Maximiano Coss, NP as PCP - General (Adult Health Nurse Practitioner) Mauro Kaufmann, RN as Oncology Nurse Navigator Rockwell Germany, RN as Oncology Nurse Navigator Stark Klein, MD as Consulting Physician (General Surgery) [name], MD as Consulting Physician (Hematology) Kyung Rudd, MD as Consulting Physician (Radiation Oncology) Estill Dooms, NP as Nurse Practitioner (Obstetrics and Gynecology)  Date of Service:  03/25/2021  CHIEF COMPLAINT: f/u of left breast cancer  CURRENT THERAPY:  Adjuvant AC q14d, starting 02/11/21  ASSESSMENT & PLAN:  Pamela Reyes is a 41 y.o. female with   1. 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 The mass has grown significantly since 2017. Biopsy on 11/05/20 confirmed invasive ductal carcinoma, with metastatic involvement of one lymph node. -staging CT CAP and bone scan on 11/19/20 showed no distant metastasis -she underwent left mastectomy on 01/06/21 under Dr. Barry Dienes. Pathology showed 6.5 cm invasive ductal carcinoma, grade 2, involving dermis of nipple without epidermal involvement. Margins were negative, but all 4 lymph nodes showed metastatic disease. -Due to the large tumor and 4 positive lymph nodes, the recommendation is for AC-T. She began chemo on 02/11/21. -she tolerated cycle 1 relatively well overall; she experienced intense nausea (without vomiting) and fatigue. She was able to recover well. She has tolerated subsequent cycles well overall. -Labs reviewed, overall stable, adequate to proceed with final cycle AC-T today.  -She will move to weekly taxol in 2 weeks, plan to give for 12 weeks. I briefly reviewed her treatment plan, including  radiation, antiestrogen therapy, ovarian suppression, and adjuvant Verzenio.  -Dr. Barry Dienes plan to do ALND after she completes chemo and before her radiation   2. Peripheral Neuropathy, G1 -present prior to breast cancer diagnosis. -possibly alcohol-related from prior use, discontinued alcohol with cancer diagnosis. -very mild, present intermittently and predominantly in her left hand and foot -she has started B complex and is using cryotherapy and compression stockings during chemo -will monitor on chemo   3. Genetics -she has a strong family history of breast cancer. She reports her sister, who was diagnosed at age 58 and has since recovered, had negative genetic testing. -genetic testing performed 11/11/20, results were negative.   4. Anxiety and stress -she has been connected with our social worker -additional anxiety from steroid, will decrease to 2 mg   5. Health maintenance -She had pap/pelvic 02/10/21 -she takes vitamin C, D, B complex, and pre/probiotic for GI health. Ok to continue      PLAN: -proceed with final cycle AC today with GCSF on day 3 -lab, flush, and taxol weekly as scheduled, fist dose in 2 weeks  -f/u with first taxol then with every other treatment   No problem-specific Assessment & Plan notes found for this encounter.   SUMMARY OF ONCOLOGIC HISTORY: Oncology History Overview Note   Cancer Staging  Malignant neoplasm of upper-outer quadrant of left breast in female, estrogen receptor positive ([place]) Staging form: Breast, AJCC 8th Edition - Clinical stage from 11/05/2020: Stage IIA (cT2, cN1, cM0, G1, ER+, PR+, HER2: Equivocal) - Signed by [name], MD on 11/10/2020 - Pathologic stage from 01/06/2021: Stage IB (pT3, pN2a, cM0, G2, ER+, PR+, HER2-) - Signed by [name], MD on 01/12/2021  Malignant neoplasm of upper-outer quadrant of left breast in female, estrogen receptor positive ([place])  10/28/2020 Mammogram   Bilateral Diagnostic Mammogram; Left Breast  Ultrasound  IMPRESSION: 1. Suspicious palpable mass/hypoechoic area in the left breast at 12 o'clock retroareolar measuring 4.5 cm.   2.  Suspicious lymph node in the left axilla.   11/05/2020 Cancer Staging   Staging form: Breast, AJCC 8th Edition - Clinical stage from 11/05/2020: Stage IIA (cT2, cN1, cM0, G1, ER+, PR+, HER2: Equivocal) - Signed by [name], MD on 11/10/2020 Stage prefix: Initial diagnosis Histologic grading system: 3 grade system    11/05/2020 Pathology Results   Diagnosis 1. Breast, left, needle core biopsy, 12 o'clock subareolar, ribbon clip - INVASIVE MAMMARY CARCINOMA. SEE NOTE 1. Carcinoma measures 1.5 cm in greatest linear dimension and appears grade 2. 1. Immunohistochemical stain for E-cadherin is positive in the tumor cells, consistent with a ductal phenotype. 1. PROGNOSTIC INDICATORS Results: The tumor cells are EQUIVOCAL for Her2 (2+). Her2 by FISH will be performed and results reported separately. Estrogen Receptor: 70%, POSITIVE, STRONG-MODERATE STAINING INTENSITY Progesterone Receptor: 80%, POSITIVE, MODERATE STAINING INTENSITY Proliferation Marker Ki67: 1%  2. Lymph node, needle/core biopsy, left axilla, tribell clip - METASTATIC CARCINOMA TO A LYMPH NODE. SEE NOTE 2. Largest contiguous focus of metastatic carcinoma measures 0.3 cm. 2. PROGNOSTIC INDICATORS Results: The tumor cells are EQUIVOCAL for Her2 (2+). Her2 by FISH will be performed and results reported separately. Estrogen Receptor: 90%, POSITIVE, STRONG STAINING INTENSITY Progesterone Receptor: 95%, POSITIVE, STRONG STAINING INTENSITY Proliferation Marker Ki67: 1%   11/10/2020 Initial Diagnosis   Malignant neoplasm of upper-outer quadrant of left breast in female, estrogen receptor positive ([place])   11/19/2020 Imaging   EXAM: CT CHEST, ABDOMEN, AND PELVIS WITH CONTRAST  IMPRESSION: 1. Left breast mass  as previously described. 2. Enhancing subcentimeter left axillary lymph  nodes. 3. No evidence of distal metastatic disease. 4. Small hiatal hernia   11/19/2020 Imaging   EXAM: NUCLEAR MEDICINE WHOLE BODY BONE SCAN  IMPRESSION: No evidence of metastatic disease.   11/21/2020 Imaging   EXAM: BILATERAL BREAST MRI WITH AND WITHOUT CONTRAST  IMPRESSION: 1. Suspicious 1.3 centimeter mass in the anterior UPPER central RIGHT breast warranting tissue diagnosis. (Image 65 of series 7). 2. 4 millimeter possible satellite nodule posterior to the 1.3 centimeter mass in the RIGHT breast. (Image 63 of series 7). 3. Indeterminate oval mass in the UPPER central middle depth of the RIGHT breast warranting tissue diagnosis. (Image 60 of series 7). 4. Large area of enhancement in the central portion of the LEFT breast, involving all quadrants. There is associated enhancement and retraction of the LEFT nipple, with significantly smaller size of the LEFT breast. Enhancement spans at least 6.5 centimeters. 5. Enlarged, previously biopsied LEFT axillary lymph node.   11/24/2020 Genetic Testing   Negative hereditary cancer genetic testing: no pathogenic variants detected in [facility] BRCAPlus STAT Panel or [facility] CustomNext-Cancer +RNAinsight Panel.  The report dates are November 24, 2020 and November 27, 2020, respectively.   The BRCAplus panel offered by [employer] and includes sequencing and deletion/duplication analysis for the following 8 genes: ATM, BRCA1, BRCA2, CDH1, CHEK2, PALB2, PTEN, and TP53.  The CustomNext-Cancer+RNAinsight panel offered by [facility] includes sequencing and rearrangement analysis for the following 47 genes:  APC, ATM, AXIN2, BARD1, BMPR1A, BRCA1, BRCA2, BRIP1, CDH1, CDK4, CDKN2A, CHEK2, DICER1, EPCAM, GREM1, HOXB13, MEN1, MLH1, MSH2, MSH3, MSH6, MUTYH, NBN, NF1, NF2, NTHL1, PALB2, PMS2, POLD1, POLE, PTEN, RAD51C, RAD51D, RECQL, RET, SDHA, SDHAF2,  SDHB, SDHC, SDHD, SMAD4, SMARCA4, STK11, TP53, TSC1, TSC2, and VHL.  RNA data is routinely analyzed for  use in variant interpretation for all genes.    11/27/2020 Pathology Results   Diagnosis Breast, right, needle core biopsy, anterior upper central - FIBROADENOMATOID AND FIBROCYSTIC CHANGES WITH CALCIFICATIONS - PSEUDOANGIOMATOUS STROMAL HYPERPLASIA - FOCAL PERIDUCTULAR CHRONIC INFLAMMATION - NO MALIGNANCY IDENTIFIED   01/06/2021 Cancer Staging   Staging form: Breast, AJCC 8th Edition - Pathologic stage from 01/06/2021: Stage IB (pT3, pN2a, cM0, G2, ER+, PR+, HER2-) - Signed by [name], MD on 01/12/2021 Stage prefix: Initial diagnosis Histologic grading system: 3 grade system Residual tumor (R): R0 - None    01/06/2021 Definitive Surgery   FINAL MICROSCOPIC DIAGNOSIS:   A. LYMPH NODE, LEFT AXILLARY, SENTINEL, EXCISION:  - Metastatic carcinoma to a lymph node (1/1)  - Focus of metastatic carcinoma measures 1.5 cm without evidence of extranodal extension   B. 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 BREAST, LEFT INFERIOLATERAL MARGIN, EXCISION:  - Benign fibroadipose tissue  - Negative for carcinoma   D. LYMPH NODE, LEFT AXILLARY #1, SENTINEL, EXCISION:  - Metastatic carcinoma to a lymph node (1/1)  - Focus of metastatic carcinoma measures 1.4 cm without evidence of extranodal extension   E. LYMPH NODE, LEFT AXILLARY #2, SENTINEL, EXCISION:  - Metastatic carcinoma to a lymph node (1/1)  - Focus of metastatic carcinoma measures 0.5 cm without evidence of extranodal extension   F. LYMPH NODE, LEFT AXILLARY #3, SENTINEL, EXCISION:  - Metastatic carcinoma to a lymph node (1/1)  - Focus of metastatic carcinoma measures 0.9 cm without evidence of extranodal extension   G. LYMPH NODE, LEFT AXILLARY #4, SENTINEL, EXCISION:  - Benign fibroadipose tissue, negative for carcinoma  -  Lymphoid tissue is not identified    02/11/2021 -  Chemotherapy   Patient is on Treatment Plan : BREAST ADJUVANT DOSE DENSE AC q14d / PACLitaxel q7d        INTERVAL HISTORY:  Pamela Reyes is here for a follow up of breast cancer. She was last seen by me on 03/11/21. She presents to the clinic alone. She reports last cycle was tough. She reports agitation on days 2 and 3, as well as nausea. She endorses using benadryl to sleep and relax. She denies rash.   All other systems were reviewed with the patient and are negative.  MEDICAL HISTORY:  Past Medical History:  Diagnosis Date   Anxiety    Breast cancer ([place])    Family history of breast cancer 11/11/2020   Fibroid 10/27/2020   Has 1 x .9 x .6 cm subserosal fibroid   History of hiatal hernia     SURGICAL HISTORY: Past Surgical History:  Procedure Laterality Date   BREAST RECONSTRUCTION WITH PLACEMENT OF TISSUE EXPANDER AND FLEX HD (ACELLULAR HYDRATED DERMIS) Left 01/06/2021   Procedure: LEFT BREAST RECONSTRUCTION WITH PLACEMENT OF TISSUE EXPANDER AND ACELLULAR  DERMIS;  Surgeon: Irene Limbo, MD;  Location: [facility];  Service: Plastics;  Laterality: Left;   MASTECTOMY W/ SENTINEL NODE BIOPSY Left 01/06/2021   Procedure: LEFT MASTECTOMY WITH MAGTRACE;  Surgeon: Stark Klein, MD;  Location: [facility];  Service: General;  Laterality: Left;   PORTACATH PLACEMENT Right 01/19/2021   Procedure: INSERTION PORT-A-CATH;  Surgeon: Barry Dienes,  Dorris Fetch, MD;  Location: [facility];  Service: General;  Laterality: Right;   RADIOACTIVE SEED GUIDED AXILLARY SENTINEL LYMPH NODE Left 01/06/2021   Procedure: RADIOACTIVE SEED GUIDED SENTINEL LYMPH NODE BIOPSY;  Surgeon: Stark Klein, MD;  Location: [facility];  Service: General;  Laterality: Left;   SENTINEL NODE BIOPSY Left 01/06/2021   Procedure: LEFT AXILLARY SENTINEL LYMPH  NODE BIOPSY;  Surgeon: Stark Klein, MD;  Location: [facility];  Service: General;  Laterality: Left;   WISDOM TOOTH EXTRACTION      I have  reviewed the social history and family history with the patient and they are unchanged from previous note.  ALLERGIES:  has No Known Allergies.  MEDICATIONS:  Current Outpatient Medications  Medication Sig Dispense Refill   acetaminophen (TYLENOL) 500 MG tablet Take 500-1,000 mg by mouth every 6 (six) hours as needed for moderate pain or headache.     Cholecalciferol (DIALYVITE VITAMIN D 5000) 125 MCG (5000 UT) capsule Take 5,000 Units by mouth daily. Vit D and K supplement combined     dexamethasone (DECADRON) 4 MG tablet Take 1 tablet (4 mg total) by mouth daily. Take daily for 3 days after chemo. Take with food. 20 tablet 0   diphenhydrAMINE (BENADRYL) 25 MG tablet Take 25 mg by mouth at bedtime as needed for sleep.     famotidine (PEPCID) 10 MG tablet Take 10 mg by mouth daily as needed for heartburn or indigestion.     fluconazole (DIFLUCAN) 100 MG tablet Take 1 tablet (100 mg total) by mouth daily. 1 tablet 0   lidocaine-prilocaine (EMLA) cream Apply to affected area once 30 g 3   loratadine (CLARITIN) 10 MG tablet Take 10 mg by mouth daily.     LORazepam (ATIVAN) 0.5 MG tablet Take 1 tablet (0.5 mg total) by mouth every 6 (six) hours as needed (Nausea or vomiting). 20 tablet 0   ondansetron (ZOFRAN) 8 MG tablet Take 1 tablet (8 mg total) by mouth 2 (two) times daily as needed. Start on the third day after chemotherapy. 30 tablet 1   prochlorperazine (COMPAZINE) 10 MG tablet Take 1 tablet (10 mg total) by mouth every 6 (six) hours as needed (Nausea or vomiting). 30 tablet 1   No current facility-administered medications for this visit.   Facility-Administered Medications Ordered in Other Visits  Medication Dose Route Frequency Provider Last Rate Last Admin   cyclophosphamide (CYTOXAN) 980 mg in sodium chloride 0.9 % 250 mL chemo infusion  600 mg/m2 (Treatment Plan Recorded) Intravenous Once [name], MD       DOXOrubicin (ADRIAMYCIN) chemo injection 98 mg  60 mg/m2 (Treatment Plan  Recorded) Intravenous Once [name], MD   98 mg at 03/25/21 1138   heparin lock flush 100 unit/mL  500 Units Intracatheter Once PRN [name], MD       sodium chloride flush (NS) 0.9 % injection 10 mL  10 mL Intracatheter PRN [name], MD        PHYSICAL EXAMINATION: ECOG PERFORMANCE STATUS: 1 - Symptomatic but completely ambulatory  Vitals:   03/25/21 0941  BP: 106/73  Pulse: 94  Resp: 18  Temp: 99 F (37.2 C)  SpO2: 100%   Wt Readings from Last 3 Encounters:  03/25/21 139 lb 1.6 oz (63.1 kg)  03/11/21 136 lb 4.8 oz (61.8 kg)  02/25/21 135 lb 8 oz (61.5 kg)     GENERAL:alert, no distress and comfortable SKIN: skin color normal, no rashes or significant lesions EYES: normal, Conjunctiva are  pink and non-injected, sclera clear  NEURO: alert & oriented x 3 with fluent speech  LABORATORY DATA:  I have reviewed the data as listed CBC Latest Ref Rng & Units 03/25/2021 03/11/2021 02/25/2021  WBC 4.0 - 10.5 K/uL 15.4(H) 12.6(H) 10.9(H)  Hemoglobin 12.0 - 15.0 g/dL 12.2 12.5 12.3  Hematocrit 36.0 - 46.0 % 35.6(L) 37.2 36.6  Platelets 150 - 400 K/uL 253 250 252     CMP Latest Ref Rng & Units 03/25/2021 03/11/2021 02/25/2021  Glucose 70 - 99 mg/dL 84 89 86  BUN 6 - 20 mg/dL 21(H) 17 16  Creatinine 0.44 - 1.00 mg/dL 0.59 0.60 0.63  Sodium 135 - 145 mmol/L 138 140 139  Potassium 3.5 - 5.1 mmol/L 4.0 4.1 4.1  Chloride 98 - 111 mmol/L 104 105 106  CO2 22 - 32 mmol/L _0 Calcium 8.9 - 10.3 mg/dL 9.5 9.9 9.6  Total Protein 6.5 - 8.1 g/dL 7.0 7.1 7.0  Total Bilirubin 0.3 - 1.2 mg/dL 0.3 0.2(L) 0.2(L)  Alkaline Phos 38 - 126 U/L 75 69 55  AST 15 - 41 U/L _1 ALT 0 - 44 U/L 21 50(H) 29      RADIOGRAPHIC STUDIES: I have personally reviewed the radiological images as listed and agreed with the findings in the report. No results found.    No orders of the defined types were placed in this encounter.  All questions were answered. The patient knows to call the clinic with  any problems, questions or concerns. No barriers to learning was detected. The total time spent in the appointment was 30 minutes.     [name], MD 03/25/2021   I, Wilburn Mylar, am acting as scribe for [name], MD.   I have reviewed the above documentation for accuracy and completeness, and I agree with the above.

## 2021-03-26 ENCOUNTER — Telehealth: Payer: Self-pay | Admitting: Hematology

## 2021-03-26 NOTE — Telephone Encounter (Signed)
Scheduled follow-up appointment per 2/16 los. Patient is aware.

## 2021-03-27 ENCOUNTER — Other Ambulatory Visit: Payer: Self-pay

## 2021-03-27 ENCOUNTER — Inpatient Hospital Stay: Payer: BC Managed Care – PPO

## 2021-03-27 VITALS — BP 111/50 | HR 85 | Temp 98.2°F | Resp 17

## 2021-03-27 DIAGNOSIS — Z5111 Encounter for antineoplastic chemotherapy: Secondary | ICD-10-CM | POA: Diagnosis not present

## 2021-03-27 DIAGNOSIS — C50412 Malignant neoplasm of upper-outer quadrant of left female breast: Secondary | ICD-10-CM

## 2021-03-27 MED ORDER — PEGFILGRASTIM-BMEZ 6 MG/0.6ML ~~LOC~~ SOSY
6.0000 mg | PREFILLED_SYRINGE | Freq: Once | SUBCUTANEOUS | Status: AC
  Administered 2021-03-27: 6 mg via SUBCUTANEOUS
  Filled 2021-03-27: qty 0.6

## 2021-03-27 NOTE — Patient Instructions (Signed)

## 2021-03-31 LAB — METHYLMALONIC ACID, SERUM: Methylmalonic Acid, Quantitative: 181 nmol/L (ref 0–378)

## 2021-04-03 ENCOUNTER — Other Ambulatory Visit: Payer: Self-pay | Admitting: Hematology

## 2021-04-05 ENCOUNTER — Other Ambulatory Visit: Payer: Self-pay

## 2021-04-05 ENCOUNTER — Ambulatory Visit: Payer: BC Managed Care – PPO | Attending: Hematology

## 2021-04-05 VITALS — Wt 138.1 lb

## 2021-04-05 DIAGNOSIS — Z483 Aftercare following surgery for neoplasm: Secondary | ICD-10-CM | POA: Insufficient documentation

## 2021-04-05 NOTE — Therapy (Signed)
[facility] @ [address] [place], [place], [address] Phone: [phone]   Fax:  [phone]  Physical Therapy Treatment  Patient Details  Name: Pamela Reyes MRN: [id number] Date of Birth: 1980/10/18 Referring Provider (PT): Dr. Burr Medico   Encounter Date: 04/05/2021   PT End of Session - 04/05/21 1020     Visit Number 3   # unchanged due to screen only   PT Start Time 1019    PT Stop Time 1023    PT Time Calculation (min) 4 min    Activity Tolerance Patient tolerated treatment well    Behavior During Therapy [facility] for tasks assessed/performed             Past Medical History:  Diagnosis Date   Anxiety    Breast cancer ([place])    Family history of breast cancer 11/11/2020   Fibroid 10/27/2020   Has 1 x .9 x .6 cm subserosal fibroid   History of hiatal hernia     Past Surgical History:  Procedure Laterality Date   BREAST RECONSTRUCTION WITH PLACEMENT OF TISSUE EXPANDER AND FLEX HD (ACELLULAR HYDRATED DERMIS) Left 01/06/2021   Procedure: LEFT BREAST RECONSTRUCTION WITH PLACEMENT OF TISSUE EXPANDER AND ACELLULAR  DERMIS;  Surgeon: Irene Limbo, MD;  Location: [facility];  Service: Plastics;  Laterality: Left;   MASTECTOMY W/ SENTINEL NODE BIOPSY Left 01/06/2021   Procedure: LEFT MASTECTOMY WITH MAGTRACE;  Surgeon: Stark Klein, MD;  Location: [facility];  Service: General;  Laterality: Left;   PORTACATH PLACEMENT Right 01/19/2021   Procedure: INSERTION PORT-A-CATH;  Surgeon: Stark Klein, MD;  Location: [facility];  Service: General;  Laterality: Right;   RADIOACTIVE SEED GUIDED AXILLARY SENTINEL LYMPH NODE Left 01/06/2021   Procedure: RADIOACTIVE SEED GUIDED SENTINEL LYMPH NODE BIOPSY;  Surgeon: Stark Klein, MD;  Location: [facility];  Service: General;  Laterality: Left;   SENTINEL NODE BIOPSY Left 01/06/2021   Procedure: LEFT AXILLARY SENTINEL LYMPH  NODE BIOPSY;  Surgeon: Stark Klein, MD;  Location: [facility];  Service: General;   Laterality: Left;   WISDOM TOOTH EXTRACTION      Vitals:   04/05/21 1020  Weight: 138 lb 2 oz (62.7 kg)     Subjective Assessment - 04/05/21 1020     Subjective Pt returns for her 3 month L-Dex screen.    Pertinent History Diagnosed 10/29/2020 and found by self exam. Biopsy revealed IDC ER, PR +, HER 2 -.  Pt underwent left mastectomy on 01/06/2021 with expander. 4+/5 LN.  She will be having chemotherapy in January.                    L-DEX FLOWSHEETS - 04/05/21 1000       L-DEX LYMPHEDEMA SCREENING   Measurement Type Unilateral    L-DEX MEASUREMENT EXTREMITY Upper Extremity    POSITION  Standing    DOMINANT SIDE Right    At Risk Side Left    BASELINE SCORE (UNILATERAL) -0.2    L-DEX SCORE (UNILATERAL) -1.5    VALUE CHANGE (UNILAT) -1.3                                     PT Long Term Goals - 02/10/21 1339       PT LONG TERM GOAL #1   Title Pt will restore left shoulder ROM to pre surgery levels or within 5-10 degrees of  pre-surgery levels    Baseline still limited for abd but improved quickly today with wall slides    Time 6    Period Months    Status Partially Met    Target Date 02/10/21      PT LONG TERM GOAL #2   Title Pt will be independent in MLD prn to decrease left axillary swelling    Time 6    Period Weeks    Status Deferred    Target Date 04/06/21      PT LONG TERM GOAL #3   Title Pt will be fit for compression sleeve/gauntlet for prophylactic reasons    Baseline given script today    Period Weeks    Status Partially Met    Target Date 02/10/21                   Plan - 04/05/21 1024     Clinical Impression Statement Pt returns for her 3 month L-Dex screen. Her change from baseline of -1.3 is WNLs so no further treatment is required at this time except to cont every 3 month L-Dex screens which pt is agreeable to.    PT Treatment/Interventions Other (comment)    PT Next Visit Plan Cont every 3 month  L-Dex screens for up to 2 years from her SLNB (~01/07/2023)    Consulted and Agree with Plan of Care Patient             Patient will benefit from skilled therapeutic intervention in order to improve the following deficits and impairments:     Visit Diagnosis: Aftercare following surgery for neoplasm     Problem List Patient Active Problem List   Diagnosis Date Noted   Port-A-Cath in place 03/25/2021   Encounter for screening fecal occult blood testing 02/10/2021   Encounter for gynecological examination with Papanicolaou smear of cervix 02/10/2021   Genetic testing 11/25/2020   Family history of breast cancer 11/11/2020   Malignant neoplasm of upper-outer quadrant of left breast in female, estrogen receptor positive ([place]) 11/10/2020   Fibroid 10/27/2020   LLQ pain 10/06/2020   Left breast mass 10/06/2020   Breast pain, left 10/06/2020    Pamela Reyes, PTA 04/05/2021, 10:26 AM  [facility] @ [address] [place], [place], [address] Phone: [phone]   Fax:  [phone]  Name: Pamela Reyes MRN: [id number] Date of Birth: 1980-07-15

## 2021-04-06 ENCOUNTER — Encounter (HOSPITAL_COMMUNITY): Payer: Self-pay

## 2021-04-08 ENCOUNTER — Inpatient Hospital Stay: Payer: BC Managed Care – PPO | Attending: Hematology

## 2021-04-08 ENCOUNTER — Encounter: Payer: Self-pay | Admitting: Hematology

## 2021-04-08 ENCOUNTER — Inpatient Hospital Stay (HOSPITAL_BASED_OUTPATIENT_CLINIC_OR_DEPARTMENT_OTHER): Payer: BC Managed Care – PPO | Admitting: Hematology

## 2021-04-08 ENCOUNTER — Other Ambulatory Visit: Payer: Self-pay

## 2021-04-08 ENCOUNTER — Inpatient Hospital Stay: Payer: BC Managed Care – PPO

## 2021-04-08 VITALS — BP 112/55 | HR 78 | Temp 98.6°F | Resp 16 | Ht 64.0 in | Wt 139.4 lb

## 2021-04-08 DIAGNOSIS — Z170 Estrogen receptor positive status [ER+]: Secondary | ICD-10-CM

## 2021-04-08 DIAGNOSIS — C50412 Malignant neoplasm of upper-outer quadrant of left female breast: Secondary | ICD-10-CM | POA: Diagnosis present

## 2021-04-08 DIAGNOSIS — Z5111 Encounter for antineoplastic chemotherapy: Secondary | ICD-10-CM | POA: Insufficient documentation

## 2021-04-08 DIAGNOSIS — Z95828 Presence of other vascular implants and grafts: Secondary | ICD-10-CM

## 2021-04-08 LAB — CMP (CANCER CENTER ONLY)
ALT: 16 U/L (ref 0–44)
AST: 16 U/L (ref 15–41)
Albumin: 4.3 g/dL (ref 3.5–5.0)
Alkaline Phosphatase: 79 U/L (ref 38–126)
Anion gap: 5 (ref 5–15)
BUN: 18 mg/dL (ref 6–20)
CO2: 28 mmol/L (ref 22–32)
Calcium: 9.6 mg/dL (ref 8.9–10.3)
Chloride: 105 mmol/L (ref 98–111)
Creatinine: 0.59 mg/dL (ref 0.44–1.00)
GFR, Estimated: >60 mL/min (ref >60)
Glucose, Bld: 90 mg/dL (ref 70–99)
Potassium: 4.1 mmol/L (ref 3.5–5.1)
Sodium: 138 mmol/L (ref 135–145)
Total Bilirubin: 0.3 mg/dL (ref 0.3–1.2)
Total Protein: 6.9 g/dL (ref 6.5–8.1)

## 2021-04-08 LAB — CBC WITH DIFFERENTIAL (CANCER CENTER ONLY)
Abs Immature Granulocytes: 1.39 10*3/uL — ABNORMAL HIGH (ref 0.00–0.07)
Basophils Absolute: 0.1 10*3/uL (ref 0.0–0.1)
Basophils Relative: 1 %
Eosinophils Absolute: 0.1 10*3/uL (ref 0.0–0.5)
Eosinophils Relative: 1 %
HCT: 35.7 % — ABNORMAL LOW (ref 36.0–46.0)
Hemoglobin: 12.1 g/dL (ref 12.0–15.0)
Immature Granulocytes: 11 %
Lymphocytes Relative: 11 %
Lymphs Abs: 1.4 10*3/uL (ref 0.7–4.0)
MCH: 31.2 pg (ref 26.0–34.0)
MCHC: 33.9 g/dL (ref 30.0–36.0)
MCV: 92 fL (ref 80.0–100.0)
Monocytes Absolute: 1.1 10*3/uL — ABNORMAL HIGH (ref 0.1–1.0)
Monocytes Relative: 9 %
Neutro Abs: 8.5 10*3/uL — ABNORMAL HIGH (ref 1.7–7.7)
Neutrophils Relative %: 67 %
Platelet Count: 250 10*3/uL (ref 150–400)
RBC: 3.88 MIL/uL (ref 3.87–5.11)
RDW: 16.3 % — ABNORMAL HIGH (ref 11.5–15.5)
WBC Count: 12.6 10*3/uL — ABNORMAL HIGH (ref 4.0–10.5)
nRBC: 0 % (ref 0.0–0.2)

## 2021-04-08 MED ORDER — SODIUM CHLORIDE 0.9% FLUSH
10.0000 mL | Freq: Once | INTRAVENOUS | Status: AC
  Administered 2021-04-08: 10 mL

## 2021-04-08 MED ORDER — PACLITAXEL PROTEIN-BOUND CHEMO INJECTION 100 MG
100.0000 mg/m2 | Freq: Once | INTRAVENOUS | Status: AC
  Administered 2021-04-08: 175 mg via INTRAVENOUS
  Filled 2021-04-08: qty 35

## 2021-04-08 MED ORDER — SODIUM CHLORIDE 0.9 % IV SOLN: Freq: Once | INTRAVENOUS | Status: AC

## 2021-04-08 MED ORDER — SODIUM CHLORIDE 0.9% FLUSH
10.0000 mL | INTRAVENOUS | Status: DC | PRN
  Administered 2021-04-08: 10 mL

## 2021-04-08 MED ORDER — ONDANSETRON HCL 8 MG PO TABS
8.0000 mg | ORAL_TABLET | Freq: Once | ORAL | Status: AC
  Administered 2021-04-08: 8 mg via ORAL
  Filled 2021-04-08: qty 1

## 2021-04-08 MED ORDER — HEPARIN SOD (PORK) LOCK FLUSH 100 UNIT/ML IV SOLN
500.0000 [IU] | Freq: Once | INTRAVENOUS | Status: AC | PRN
  Administered 2021-04-08: 500 [IU]

## 2021-04-08 NOTE — Progress Notes (Signed)
[place]   [phone] [phone]   Clinic Follow up Note   Patient Care Team: Maximiano Coss, NP as PCP - General (Adult Health Nurse Practitioner) Mauro Kaufmann, RN as Oncology Nurse Navigator Rockwell Germany, RN as Oncology Nurse Navigator Stark Klein, MD as Consulting Physician (General Surgery) [name], MD as Consulting Physician (Hematology) Kyung Rudd, MD as Consulting Physician (Radiation Oncology) Estill Dooms, NP as Nurse Practitioner (Obstetrics and Gynecology)  Date of Service:  04/08/2021  CHIEF COMPLAINT: f/u of left breast cancer  CURRENT THERAPY:  Abraxane, weekly, starting 04/08/21  ASSESSMENT & PLAN:  Pamela Reyes is a 41 y.o. female with   1. 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 The mass has grown significantly since 2017. Biopsy on 11/05/20 confirmed invasive ductal carcinoma, with metastatic involvement of one lymph node. -staging CT CAP and bone scan on 11/19/20 showed no distant metastasis -she underwent left mastectomy on 01/06/21 under Dr. Barry Dienes. Pathology showed 6.5 cm invasive ductal carcinoma, grade 2, involving dermis of nipple without epidermal involvement. Margins were negative, but all 4 lymph nodes showed metastatic disease. -Due to the large tumor and 4 positive lymph nodes, the recommendation is for AC-T. She began chemo on 02/11/21. -she tolerated cycle 1 relatively well overall; she experienced intense nausea (without vomiting) and fatigue. She was able to recover well. She tolerated subsequent cycles well overall. She completed 4 cycles of AC-T on 03/25/21 -her insurance has approved Abraxane, so she is scheduled to begin this today. Plan for 12 weekly treatments.  I again reviewed the potential side effect and  management, encouraged her to use ice pack for her hands to decrease neuropathy -labs reviewed, overall stable and adequate to start Abraxane today, will do $Remov'100mg'WwonDq$ /m2. I will see her every other treatment.   2. Peripheral Neuropathy, G1 -present prior to breast cancer diagnosis. -possibly alcohol-related from prior use, discontinued alcohol with cancer diagnosis. -very mild, present intermittently and predominantly in her left hand and foot -she has started B complex -will monitor on chemo   3. Genetics -she has a strong family history of breast cancer. She reports her sister, who was diagnosed at age 93 and has since recovered, had negative genetic testing. -genetic testing performed 11/11/20, results were negative.   4. Anxiety and stress -she has been connected with our social worker -additional anxiety from steroid, will decrease to 2 mg   5. Health maintenance -She had pap/pelvic 02/10/21 -she takes vitamin C, D, B complex, and pre/probiotic for GI health. Ok to continue      PLAN: -proceed with first Abraxane today -lab and Abraxane weekly -f/u in 2 weeks   No problem-specific Assessment & Plan notes found for this encounter.   SUMMARY OF ONCOLOGIC HISTORY: Oncology History Overview Note   Cancer Staging  Malignant neoplasm of upper-outer quadrant of left breast in female, estrogen receptor positive ([place]) Staging form: Breast, AJCC 8th Edition - Clinical stage from 11/05/2020: Stage IIA (cT2, cN1, cM0, G1, ER+, PR+, HER2: Equivocal) - Signed by [name], MD on 11/10/2020 - Pathologic stage from 01/06/2021: Stage IB (pT3, pN2a, cM0, G2, ER+, PR+, HER2-) - Signed by [name], MD on 01/12/2021     Malignant neoplasm of upper-outer quadrant of left breast in female, estrogen receptor positive ([place])  10/28/2020 Mammogram   Bilateral Diagnostic  Mammogram; Left Breast Ultrasound  IMPRESSION: 1. Suspicious palpable mass/hypoechoic area in the left breast at 12 o'clock  retroareolar measuring 4.5 cm.   2.  Suspicious lymph node in the left axilla.   11/05/2020 Cancer Staging   Staging form: Breast, AJCC 8th Edition - Clinical stage from 11/05/2020: Stage IIA (cT2, cN1, cM0, G1, ER+, PR+, HER2: Equivocal) - Signed by [name], MD on 11/10/2020 Stage prefix: Initial diagnosis Histologic grading system: 3 grade system    11/05/2020 Pathology Results   Diagnosis 1. Breast, left, needle core biopsy, 12 o'clock subareolar, ribbon clip - INVASIVE MAMMARY CARCINOMA. SEE NOTE 1. Carcinoma measures 1.5 cm in greatest linear dimension and appears grade 2. 1. Immunohistochemical stain for E-cadherin is positive in the tumor cells, consistent with a ductal phenotype. 1. PROGNOSTIC INDICATORS Results: The tumor cells are EQUIVOCAL for Her2 (2+). Her2 by FISH will be performed and results reported separately. Estrogen Receptor: 70%, POSITIVE, STRONG-MODERATE STAINING INTENSITY Progesterone Receptor: 80%, POSITIVE, MODERATE STAINING INTENSITY Proliferation Marker Ki67: 1%  2. Lymph node, needle/core biopsy, left axilla, tribell clip - METASTATIC CARCINOMA TO A LYMPH NODE. SEE NOTE 2. Largest contiguous focus of metastatic carcinoma measures 0.3 cm. 2. PROGNOSTIC INDICATORS Results: The tumor cells are EQUIVOCAL for Her2 (2+). Her2 by FISH will be performed and results reported separately. Estrogen Receptor: 90%, POSITIVE, STRONG STAINING INTENSITY Progesterone Receptor: 95%, POSITIVE, STRONG STAINING INTENSITY Proliferation Marker Ki67: 1%   11/10/2020 Initial Diagnosis   Malignant neoplasm of upper-outer quadrant of left breast in female, estrogen receptor positive ([place])   11/19/2020 Imaging   EXAM: CT CHEST, ABDOMEN, AND PELVIS WITH CONTRAST  IMPRESSION: 1. Left breast mass  as previously described. 2. Enhancing subcentimeter left axillary lymph nodes. 3. No evidence of distal metastatic disease. 4. Small hiatal hernia   11/19/2020 Imaging    EXAM: NUCLEAR MEDICINE WHOLE BODY BONE SCAN  IMPRESSION: No evidence of metastatic disease.   11/21/2020 Imaging   EXAM: BILATERAL BREAST MRI WITH AND WITHOUT CONTRAST  IMPRESSION: 1. Suspicious 1.3 centimeter mass in the anterior UPPER central RIGHT breast warranting tissue diagnosis. (Image 65 of series 7). 2. 4 millimeter possible satellite nodule posterior to the 1.3 centimeter mass in the RIGHT breast. (Image 63 of series 7). 3. Indeterminate oval mass in the UPPER central middle depth of the RIGHT breast warranting tissue diagnosis. (Image 60 of series 7). 4. Large area of enhancement in the central portion of the LEFT breast, involving all quadrants. There is associated enhancement and retraction of the LEFT nipple, with significantly smaller size of the LEFT breast. Enhancement spans at least 6.5 centimeters. 5. Enlarged, previously biopsied LEFT axillary lymph node.   11/24/2020 Genetic Testing   Negative hereditary cancer genetic testing: no pathogenic variants detected in [facility] BRCAPlus STAT Panel or [facility] CustomNext-Cancer +RNAinsight Panel.  The report dates are November 24, 2020 and November 27, 2020, respectively.   The BRCAplus panel offered by [employer] and includes sequencing and deletion/duplication analysis for the following 8 genes: ATM, BRCA1, BRCA2, CDH1, CHEK2, PALB2, PTEN, and TP53.  The CustomNext-Cancer+RNAinsight panel offered by [facility] includes sequencing and rearrangement analysis for the following 47 genes:  APC, ATM, AXIN2, BARD1, BMPR1A, BRCA1, BRCA2, BRIP1, CDH1, CDK4, CDKN2A, CHEK2, DICER1, EPCAM, GREM1, HOXB13, MEN1, MLH1, MSH2, MSH3, MSH6, MUTYH, NBN, NF1, NF2, NTHL1, PALB2, PMS2, POLD1, POLE, PTEN, RAD51C, RAD51D, RECQL, RET, SDHA, SDHAF2, SDHB, SDHC, SDHD, SMAD4, SMARCA4, STK11, TP53, TSC1, TSC2, and VHL.  RNA data is routinely analyzed for use in variant  interpretation for all genes.    11/27/2020 Pathology Results   Diagnosis Breast,  right, needle core biopsy, anterior upper central - FIBROADENOMATOID AND FIBROCYSTIC CHANGES WITH CALCIFICATIONS - PSEUDOANGIOMATOUS STROMAL HYPERPLASIA - FOCAL PERIDUCTULAR CHRONIC INFLAMMATION - NO MALIGNANCY IDENTIFIED   01/06/2021 Cancer Staging   Staging form: Breast, AJCC 8th Edition - Pathologic stage from 01/06/2021: Stage IB (pT3, pN2a, cM0, G2, ER+, PR+, HER2-) - Signed by [name], MD on 01/12/2021 Stage prefix: Initial diagnosis Histologic grading system: 3 grade system Residual tumor (R): R0 - None    01/06/2021 Definitive Surgery   FINAL MICROSCOPIC DIAGNOSIS:   A. LYMPH NODE, LEFT AXILLARY, SENTINEL, EXCISION:  - Metastatic carcinoma to a lymph node (1/1)  - Focus of metastatic carcinoma measures 1.5 cm without evidence of extranodal extension   B. 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 BREAST, LEFT INFERIOLATERAL MARGIN, EXCISION:  - Benign fibroadipose tissue  - Negative for carcinoma   D. LYMPH NODE, LEFT AXILLARY #1, SENTINEL, EXCISION:  - Metastatic carcinoma to a lymph node (1/1)  - Focus of metastatic carcinoma measures 1.4 cm without evidence of extranodal extension   E. LYMPH NODE, LEFT AXILLARY #2, SENTINEL, EXCISION:  - Metastatic carcinoma to a lymph node (1/1)  - Focus of metastatic carcinoma measures 0.5 cm without evidence of extranodal extension   F. LYMPH NODE, LEFT AXILLARY #3, SENTINEL, EXCISION:  - Metastatic carcinoma to a lymph node (1/1)  - Focus of metastatic carcinoma measures 0.9 cm without evidence of extranodal extension   G. LYMPH NODE, LEFT AXILLARY #4, SENTINEL, EXCISION:  - Benign fibroadipose tissue, negative for carcinoma  - Lymphoid tissue is not identified    02/11/2021 -  Chemotherapy   Patient is on Treatment Plan : BREAST  ADJUVANT DOSE DENSE AC q14d / PACLitaxel q7d        INTERVAL HISTORY:  Pamela Reyes is here for a follow up of breast cancer. She was last seen by me on 03/25/21. She presents to the clinic alone. She reports she is doing well overall, tolerated her last cycle of AC-T well. She notes her neuropathy is overall improved.   All other systems were reviewed with the patient and are negative.  MEDICAL HISTORY:  Past Medical History:  Diagnosis Date   Anxiety    Breast cancer ([place])    Family history of breast cancer 11/11/2020   Fibroid 10/27/2020   Has 1 x .9 x .6 cm subserosal fibroid   History of hiatal hernia     SURGICAL HISTORY: Past Surgical History:  Procedure Laterality Date   BREAST RECONSTRUCTION WITH PLACEMENT OF TISSUE EXPANDER AND FLEX HD (ACELLULAR HYDRATED DERMIS) Left 01/06/2021   Procedure: LEFT BREAST RECONSTRUCTION WITH PLACEMENT OF TISSUE EXPANDER AND ACELLULAR  DERMIS;  Surgeon: Irene Limbo, MD;  Location: [facility];  Service: Plastics;  Laterality: Left;   MASTECTOMY W/ SENTINEL NODE BIOPSY Left 01/06/2021   Procedure: LEFT MASTECTOMY WITH MAGTRACE;  Surgeon: Stark Klein, MD;  Location: [facility];  Service: General;  Laterality: Left;   PORTACATH PLACEMENT Right 01/19/2021   Procedure: INSERTION PORT-A-CATH;  Surgeon: Stark Klein, MD;  Location: [facility];  Service: General;  Laterality: Right;   RADIOACTIVE SEED GUIDED AXILLARY SENTINEL LYMPH NODE Left 01/06/2021   Procedure: RADIOACTIVE SEED GUIDED  SENTINEL LYMPH NODE BIOPSY;  Surgeon: Stark Klein, MD;  Location: [facility];  Service: General;  Laterality: Left;   SENTINEL NODE BIOPSY Left 01/06/2021   Procedure: LEFT AXILLARY SENTINEL LYMPH  NODE BIOPSY;  Surgeon: Stark Klein, MD;  Location: [facility];  Service: General;  Laterality: Left;   WISDOM TOOTH EXTRACTION      I have reviewed the social history and family history with the patient and they are unchanged from previous note.  ALLERGIES:  has No Known  Allergies.  MEDICATIONS:  Current Outpatient Medications  Medication Sig Dispense Refill   acetaminophen (TYLENOL) 500 MG tablet Take 500-1,000 mg by mouth every 6 (six) hours as needed for moderate pain or headache.     Cholecalciferol (DIALYVITE VITAMIN D 5000) 125 MCG (5000 UT) capsule Take 5,000 Units by mouth daily. Vit D and K supplement combined     dexamethasone (DECADRON) 4 MG tablet Take 1 tablet (4 mg total) by mouth daily. Take daily for 3 days after chemo. Take with food. 20 tablet 0   diphenhydrAMINE (BENADRYL) 25 MG tablet Take 25 mg by mouth at bedtime as needed for sleep.     famotidine (PEPCID) 10 MG tablet Take 10 mg by mouth daily as needed for heartburn or indigestion.     fluconazole (DIFLUCAN) 100 MG tablet Take 1 tablet (100 mg total) by mouth daily. 1 tablet 0   lidocaine-prilocaine (EMLA) cream Apply to affected area once 30 g 3   loratadine (CLARITIN) 10 MG tablet Take 10 mg by mouth daily.     LORazepam (ATIVAN) 0.5 MG tablet Take 1 tablet (0.5 mg total) by mouth every 6 (six) hours as needed (Nausea or vomiting). 20 tablet 0   ondansetron (ZOFRAN) 8 MG tablet Take 1 tablet (8 mg total) by mouth 2 (two) times daily as needed. Start on the third day after chemotherapy. 30 tablet 1   prochlorperazine (COMPAZINE) 10 MG tablet Take 1 tablet (10 mg total) by mouth every 6 (six) hours as needed (Nausea or vomiting). 30 tablet 1   No current facility-administered medications for this visit.    PHYSICAL EXAMINATION: ECOG PERFORMANCE STATUS: 1 - Symptomatic but completely ambulatory  Vitals:   04/08/21 1208  BP: (!) 112/55  Pulse: 78  Resp: 16  Temp: 98.6 F (37 C)  SpO2: 100%   Wt Readings from Last 3 Encounters:  04/08/21 139 lb 6.4 oz (63.2 kg)  04/05/21 138 lb 2 oz (62.7 kg)  03/25/21 139 lb 1.6 oz (63.1 kg)     GENERAL:alert, no distress and comfortable SKIN: skin color normal, no rashes or significant lesions EYES: normal, Conjunctiva are pink and  non-injected, sclera clear  NEURO: alert & oriented x 3 with fluent speech  LABORATORY DATA:  I have reviewed the data as listed CBC Latest Ref Rng & Units 04/08/2021 03/25/2021 03/11/2021  WBC 4.0 - 10.5 K/uL 12.6(H) 15.4(H) 12.6(H)  Hemoglobin 12.0 - 15.0 g/dL 12.1 12.2 12.5  Hematocrit 36.0 - 46.0 % 35.7(L) 35.6(L) 37.2  Platelets 150 - 400 K/uL 250 253 250     CMP Latest Ref Rng & Units 04/08/2021 03/25/2021 03/11/2021  Glucose 70 - 99 mg/dL 90 84 89  BUN 6 - 20 mg/dL 18 21(H) 17  Creatinine 0.44 - 1.00 mg/dL 0.59 0.59 0.60  Sodium 135 - 145 mmol/L 138 138 140  Potassium 3.5 - 5.1 mmol/L 4.1 4.0 4.1  Chloride 98 - 111 mmol/L 105 104 105  CO2 22 - 32 mmol/L 28  28 29  Calcium 8.9 - 10.3 mg/dL 9.6 9.5 9.9  Total Protein 6.5 - 8.1 g/dL 6.9 7.0 7.1  Total Bilirubin 0.3 - 1.2 mg/dL 0.3 0.3 0.2(L)  Alkaline Phos 38 - 126 U/L 79 75 69  AST 15 - 41 U/L _0 ALT 0 - 44 U/L 16 21 50(H)      RADIOGRAPHIC STUDIES: I have personally reviewed the radiological images as listed and agreed with the findings in the report. No results found.    No orders of the defined types were placed in this encounter.  All questions were answered. The patient knows to call the clinic with any problems, questions or concerns. No barriers to learning was detected. The total time spent in the appointment was 30 minutes.     [name], MD 04/08/2021   I, Wilburn Mylar, am acting as scribe for [name], MD.   I have reviewed the above documentation for accuracy and completeness, and I agree with the above.

## 2021-04-08 NOTE — Patient Instructions (Signed)
[facility]  Discharge Instructions: Thank you for choosing [facility] to provide your oncology and hematology care.   If you have a lab appointment with the [facility], please go directly to the [facility] and check in at the registration area.   Wear comfortable clothing and clothing appropriate for easy access to any Portacath or PICC line.   We strive to give you quality time with your provider. You may need to reschedule your appointment if you arrive late (15 or more minutes).  Arriving late affects you and other patients whose appointments are after yours.  Also, if you miss three or more appointments without notifying the office, you may be dismissed from the clinic at the providers discretion.      For prescription refill requests, have your pharmacy contact our office and allow 72 hours for refills to be completed.    Today you received the following chemotherapy and/or immunotherapy agents Abraxane      To help prevent nausea and vomiting after your treatment, we encourage you to take your nausea medication as directed.  BELOW ARE SYMPTOMS THAT SHOULD BE REPORTED IMMEDIATELY: *FEVER GREATER THAN 100.4 F (38 C) OR HIGHER *CHILLS OR SWEATING *NAUSEA AND VOMITING THAT IS NOT CONTROLLED WITH YOUR NAUSEA MEDICATION *UNUSUAL SHORTNESS OF BREATH *UNUSUAL BRUISING OR BLEEDING *URINARY PROBLEMS (pain or burning when urinating, or frequent urination) *BOWEL PROBLEMS (unusual diarrhea, constipation, pain near the anus) TENDERNESS IN MOUTH AND THROAT WITH OR WITHOUT PRESENCE OF ULCERS (sore throat, sores in mouth, or a toothache) UNUSUAL RASH, SWELLING OR PAIN  UNUSUAL VAGINAL DISCHARGE OR ITCHING   Items with * indicate a potential emergency and should be followed up as soon as possible or go to the Emergency Department if any problems should occur.  Please show the CHEMOTHERAPY ALERT CARD or IMMUNOTHERAPY ALERT CARD at check-in to  the Emergency Department and triage nurse.  Should you have questions after your visit or need to cancel or reschedule your appointment, please contact [facility]  Dept: [phone]  and follow the prompts.  Office hours are 8:00 a.m. to 4:30 p.m. Monday - Friday. Please note that voicemails left after 4:00 p.m. may not be returned until the following business day.  We are closed weekends and major holidays. You have access to a nurse at all times for urgent questions. Please call the main number to the clinic Dept: [phone] and follow the prompts.   For any non-urgent questions, you may also contact your provider using MyChart. We now offer e-Visits for anyone 32 and older to request care online for non-urgent symptoms. For details visit [email or web address].   Also download the MyChart app! Go to the app store, search "MyChart", open the app, select [facility], and log in with your MyChart username and password.  Due to Covid, a mask is required upon entering the hospital/clinic. If you do not have a mask, one will be given to you upon arrival. For doctor visits, patients may have 1 support person aged 51 or older with them. For treatment visits, patients cannot have anyone with them due to current Covid guidelines and our immunocompromised population.   Nanoparticle Albumin-Bound Paclitaxel injection What is this medication? NANOPARTICLE ALBUMIN-BOUND PACLITAXEL (Na no PAHR ti kuhl  al BYOO muhn-bound  PAK li TAX el) is a chemotherapy drug. It targets fast dividing cells, like cancer cells, and causes these cells to die. This medicine is used to treat  advanced breast cancer, lung cancer, and pancreatic cancer. This medicine may be used for other purposes; ask your health care provider or pharmacist if you have questions. COMMON BRAND NAME(S): Abraxane What should I tell my care team before I take this medication? They need to know if you have any of these  conditions: kidney disease liver disease low blood counts, like low white cell, platelet, or red cell counts lung or breathing disease, like asthma tingling of the fingers or toes, or other nerve disorder an unusual or allergic reaction to paclitaxel, albumin, other chemotherapy, other medicines, foods, dyes, or preservatives pregnant or trying to get pregnant breast-feeding How should I use this medication? This drug is given as an infusion into a vein. It is administered in a hospital or clinic by a specially trained health care professional. Talk to your pediatrician regarding the use of this medicine in children. Special care may be needed. Overdosage: If you think you have taken too much of this medicine contact a poison control center or emergency room at once. NOTE: This medicine is only for you. Do not share this medicine with others. What if I miss a dose? It is important not to miss your dose. Call your doctor or health care professional if you are unable to keep an appointment. What may interact with this medication? This medicine may interact with the following medications: antiviral medicines for hepatitis, HIV or AIDS certain antibiotics like erythromycin and clarithromycin certain medicines for fungal infections like ketoconazole and itraconazole certain medicines for seizures like carbamazepine, phenobarbital, phenytoin gemfibrozil nefazodone rifampin St. John's wort This list may not describe all possible interactions. Give your health care provider a list of all the medicines, herbs, non-prescription drugs, or dietary supplements you use. Also tell them if you smoke, drink alcohol, or use illegal drugs. Some items may interact with your medicine. What should I watch for while using this medication? Your condition will be monitored carefully while you are receiving this medicine. You will need important blood work done while you are taking this medicine. This medicine  can cause serious allergic reactions. If you experience allergic reactions like skin rash, itching or hives, swelling of the face, lips, or tongue, tell your doctor or health care professional right away. In some cases, you may be given additional medicines to help with side effects. Follow all directions for their use. This drug may make you feel generally unwell. This is not uncommon, as chemotherapy can affect healthy cells as well as cancer cells. Report any side effects. Continue your course of treatment even though you feel ill unless your doctor tells you to stop. Call your doctor or health care professional for advice if you get a fever, chills or sore throat, or other symptoms of a cold or flu. Do not treat yourself. This drug decreases your body's ability to fight infections. Try to avoid being around people who are sick. This medicine may increase your risk to bruise or bleed. Call your doctor or health care professional if you notice any unusual bleeding. Be careful brushing and flossing your teeth or using a toothpick because you may get an infection or bleed more easily. If you have any dental work done, tell your dentist you are receiving this medicine. Avoid taking products that contain aspirin, acetaminophen, ibuprofen, naproxen, or ketoprofen unless instructed by your doctor. These medicines may hide a fever. Do not become pregnant while taking this medicine or for 6 months after stopping it. Women should inform their  doctor if they wish to become pregnant or think they might be pregnant. Men should not father a child while taking this medicine or for 3 months after stopping it. There is a potential for serious side effects to an unborn child. Talk to your health care professional or pharmacist for more information. Do not breast-feed an infant while taking this medicine or for 2 weeks after stopping it. This medicine may interfere with the ability to get pregnant or to father a child. You  should talk to your doctor or health care professional if you are concerned about your fertility. What side effects may I notice from receiving this medication? Side effects that you should report to your doctor or health care professional as soon as possible: allergic reactions like skin rash, itching or hives, swelling of the face, lips, or tongue breathing problems changes in vision fast, irregular heartbeat low blood pressure mouth sores pain, tingling, numbness in the hands or feet signs of decreased platelets or bleeding - bruising, pinpoint red spots on the skin, black, tarry stools, blood in the urine signs of decreased red blood cells - unusually weak or tired, feeling faint or lightheaded, falls signs of infection - fever or chills, cough, sore throat, pain or difficulty passing urine signs and symptoms of liver injury like dark yellow or brown urine; general ill feeling or flu-like symptoms; light-colored stools; loss of appetite; nausea; right upper belly pain; unusually weak or tired; yellowing of the eyes or skin swelling of the ankles, feet, hands unusually slow heartbeat Side effects that usually do not require medical attention (report to your doctor or health care professional if they continue or are bothersome): diarrhea hair loss loss of appetite nausea, vomiting tiredness This list may not describe all possible side effects. Call your doctor for medical advice about side effects. You may report side effects to FDA at 1-800-FDA-1088. Where should I keep my medication? This drug is given in a hospital or clinic and will not be stored at home. NOTE: This sheet is a summary. It may not cover all possible information. If you have questions about this medicine, talk to your doctor, pharmacist, or health care provider.  2022 Elsevier/Gold Standard (2016-09-27 00:00:00)

## 2021-04-15 ENCOUNTER — Other Ambulatory Visit: Payer: Self-pay

## 2021-04-15 ENCOUNTER — Inpatient Hospital Stay: Payer: BC Managed Care – PPO

## 2021-04-15 VITALS — BP 105/70 | HR 76 | Temp 98.5°F | Resp 16 | Wt 139.5 lb

## 2021-04-15 DIAGNOSIS — Z95828 Presence of other vascular implants and grafts: Secondary | ICD-10-CM

## 2021-04-15 DIAGNOSIS — Z170 Estrogen receptor positive status [ER+]: Secondary | ICD-10-CM

## 2021-04-15 DIAGNOSIS — Z5111 Encounter for antineoplastic chemotherapy: Secondary | ICD-10-CM | POA: Diagnosis not present

## 2021-04-15 DIAGNOSIS — C50412 Malignant neoplasm of upper-outer quadrant of left female breast: Secondary | ICD-10-CM

## 2021-04-15 LAB — CBC WITH DIFFERENTIAL (CANCER CENTER ONLY)
Abs Immature Granulocytes: 0.07 10*3/uL (ref 0.00–0.07)
Basophils Absolute: 0 10*3/uL (ref 0.0–0.1)
Basophils Relative: 1 %
Eosinophils Absolute: 0 10*3/uL (ref 0.0–0.5)
Eosinophils Relative: 0 %
HCT: 31.9 % — ABNORMAL LOW (ref 36.0–46.0)
Hemoglobin: 11.2 g/dL — ABNORMAL LOW (ref 12.0–15.0)
Immature Granulocytes: 2 %
Lymphocytes Relative: 19 %
Lymphs Abs: 0.8 10*3/uL (ref 0.7–4.0)
MCH: 31.7 pg (ref 26.0–34.0)
MCHC: 35.1 g/dL (ref 30.0–36.0)
MCV: 90.4 fL (ref 80.0–100.0)
Monocytes Absolute: 0.4 10*3/uL (ref 0.1–1.0)
Monocytes Relative: 9 %
Neutro Abs: 3.1 10*3/uL (ref 1.7–7.7)
Neutrophils Relative %: 69 %
Platelet Count: 309 10*3/uL (ref 150–400)
RBC: 3.53 MIL/uL — ABNORMAL LOW (ref 3.87–5.11)
RDW: 15.6 % — ABNORMAL HIGH (ref 11.5–15.5)
WBC Count: 4.5 10*3/uL (ref 4.0–10.5)
nRBC: 0 % (ref 0.0–0.2)

## 2021-04-15 LAB — CMP (CANCER CENTER ONLY)
ALT: 59 U/L — ABNORMAL HIGH (ref 0–44)
AST: 35 U/L (ref 15–41)
Albumin: 4.3 g/dL (ref 3.5–5.0)
Alkaline Phosphatase: 49 U/L (ref 38–126)
Anion gap: 5 (ref 5–15)
BUN: 14 mg/dL (ref 6–20)
CO2: 28 mmol/L (ref 22–32)
Calcium: 9.6 mg/dL (ref 8.9–10.3)
Chloride: 106 mmol/L (ref 98–111)
Creatinine: 1.03 mg/dL — ABNORMAL HIGH (ref 0.44–1.00)
GFR, Estimated: >60 mL/min (ref >60)
Glucose, Bld: 85 mg/dL (ref 70–99)
Potassium: 4 mmol/L (ref 3.5–5.1)
Sodium: 139 mmol/L (ref 135–145)
Total Bilirubin: 0.3 mg/dL (ref 0.3–1.2)
Total Protein: 6.5 g/dL (ref 6.5–8.1)

## 2021-04-15 MED ORDER — SODIUM CHLORIDE 0.9% FLUSH
10.0000 mL | INTRAVENOUS | Status: DC | PRN
  Administered 2021-04-15: 16:00:00 10 mL

## 2021-04-15 MED ORDER — SODIUM CHLORIDE 0.9 % IV SOLN: Freq: Once | INTRAVENOUS | Status: AC

## 2021-04-15 MED ORDER — SODIUM CHLORIDE 0.9% FLUSH
10.0000 mL | Freq: Once | INTRAVENOUS | Status: AC
  Administered 2021-04-15: 14:00:00 10 mL

## 2021-04-15 MED ORDER — ONDANSETRON HCL 8 MG PO TABS
8.0000 mg | ORAL_TABLET | Freq: Once | ORAL | Status: AC
  Administered 2021-04-15: 15:00:00 8 mg via ORAL
  Filled 2021-04-15: qty 1

## 2021-04-15 MED ORDER — PACLITAXEL PROTEIN-BOUND CHEMO INJECTION 100 MG
100.0000 mg/m2 | Freq: Once | INTRAVENOUS | Status: AC
  Administered 2021-04-15: 15:00:00 175 mg via INTRAVENOUS
  Filled 2021-04-15: qty 35

## 2021-04-15 MED ORDER — HEPARIN SOD (PORK) LOCK FLUSH 100 UNIT/ML IV SOLN
500.0000 [IU] | Freq: Once | INTRAVENOUS | Status: AC | PRN
  Administered 2021-04-15: 16:00:00 500 [IU]

## 2021-04-15 NOTE — Patient Instructions (Signed)
Nanoparticle Albumin-Bound Paclitaxel injection What is this medication? NANOPARTICLE ALBUMIN-BOUND PACLITAXEL (Na no PAHR ti kuhl  al BYOO muhn-bound  PAK li TAX el) is a chemotherapy drug. It targets fast dividing cells, like cancer cells, and causes these cells to die. This medicine is used to treat advanced breast cancer, lung cancer, and pancreatic cancer. This medicine may be used for other purposes; ask your health care provider or pharmacist if you have questions. COMMON BRAND NAME(S): Abraxane What should I tell my care team before I take this medication? They need to know if you have any of these conditions: kidney disease liver disease low blood counts, like low white cell, platelet, or red cell counts lung or breathing disease, like asthma tingling of the fingers or toes, or other nerve disorder an unusual or allergic reaction to paclitaxel, albumin, other chemotherapy, other medicines, foods, dyes, or preservatives pregnant or trying to get pregnant breast-feeding How should I use this medication? This drug is given as an infusion into a vein. It is administered in a hospital or clinic by a specially trained health care professional. Talk to your pediatrician regarding the use of this medicine in children. Special care may be needed. Overdosage: If you think you have taken too much of this medicine contact a poison control center or emergency room at once. NOTE: This medicine is only for you. Do not share this medicine with others. What if I miss a dose? It is important not to miss your dose. Call your doctor or health care professional if you are unable to keep an appointment. What may interact with this medication? This medicine may interact with the following medications: antiviral medicines for hepatitis, HIV or AIDS certain antibiotics like erythromycin and clarithromycin certain medicines for fungal infections like ketoconazole and itraconazole certain medicines for  seizures like carbamazepine, phenobarbital, phenytoin gemfibrozil nefazodone rifampin St. John's wort This list may not describe all possible interactions. Give your health care provider a list of all the medicines, herbs, non-prescription drugs, or dietary supplements you use. Also tell them if you smoke, drink alcohol, or use illegal drugs. Some items may interact with your medicine. What should I watch for while using this medication? Your condition will be monitored carefully while you are receiving this medicine. You will need important blood work done while you are taking this medicine. This medicine can cause serious allergic reactions. If you experience allergic reactions like skin rash, itching or hives, swelling of the face, lips, or tongue, tell your doctor or health care professional right away. In some cases, you may be given additional medicines to help with side effects. Follow all directions for their use. This drug may make you feel generally unwell. This is not uncommon, as chemotherapy can affect healthy cells as well as cancer cells. Report any side effects. Continue your course of treatment even though you feel ill unless your doctor tells you to stop. Call your doctor or health care professional for advice if you get a fever, chills or sore throat, or other symptoms of a cold or flu. Do not treat yourself. This drug decreases your body's ability to fight infections. Try to avoid being around people who are sick. This medicine may increase your risk to bruise or bleed. Call your doctor or health care professional if you notice any unusual bleeding. Be careful brushing and flossing your teeth or using a toothpick because you may get an infection or bleed more easily. If you have any dental work done, tell   your dentist you are receiving this medicine. ?Avoid taking products that contain aspirin, acetaminophen, ibuprofen, naproxen, or ketoprofen unless instructed by your doctor. These  medicines may hide a fever. ?Do not become pregnant while taking this medicine or for 6 months after stopping it. Women should inform their doctor if they wish to become pregnant or think they might be pregnant. Men should not father a child while taking this medicine or for 3 months after stopping it. There is a potential for serious side effects to an unborn child. Talk to your health care professional or pharmacist for more information. ?Do not breast-feed an infant while taking this medicine or for 2 weeks after stopping it. ?This medicine may interfere with the ability to get pregnant or to father a child. You should talk to your doctor or health care professional if you are concerned about your fertility. ?What side effects may I notice from receiving this medication? ?Side effects that you should report to your doctor or health care professional as soon as possible: ?allergic reactions like skin rash, itching or hives, swelling of the face, lips, or tongue ?breathing problems ?changes in vision ?fast, irregular heartbeat ?low blood pressure ?mouth sores ?pain, tingling, numbness in the hands or feet ?signs of decreased platelets or bleeding - bruising, pinpoint red spots on the skin, black, tarry stools, blood in the urine ?signs of decreased red blood cells - unusually weak or tired, feeling faint or lightheaded, falls ?signs of infection - fever or chills, cough, sore throat, pain or difficulty passing urine ?signs and symptoms of liver injury like dark yellow or brown urine; general ill feeling or flu-like symptoms; light-colored stools; loss of appetite; nausea; right upper belly pain; unusually weak or tired; yellowing of the eyes or skin ?swelling of the ankles, feet, hands ?unusually slow heartbeat ?Side effects that usually do not require medical attention (report to your doctor or health care professional if they continue or are bothersome): ?diarrhea ?hair loss ?loss of appetite ?nausea,  vomiting ?tiredness ?This list may not describe all possible side effects. Call your doctor for medical advice about side effects. You may report side effects to FDA at 1-800-FDA-1088. ?Where should I keep my medication? ?This drug is given in a hospital or clinic and will not be stored at home. ?NOTE: This sheet is a summary. It may not cover all possible information. If you have questions about this medicine, talk to your doctor, pharmacist, or health care provider. ?? 2022 Elsevier/Gold Standard (2016-09-27 00:00:00) ? ?

## 2021-04-16 ENCOUNTER — Encounter: Payer: Self-pay | Admitting: Licensed Clinical Social Worker

## 2021-04-16 ENCOUNTER — Encounter: Payer: Self-pay | Admitting: Hematology

## 2021-04-16 NOTE — Progress Notes (Signed)
[facility] CSW Progress Note ? ?Clinical Social Worker contacted patient by phone to discuss patients concerns regarding anxiety and depression.  CSW will meet with pt 3/14 in person to assess and provide supportive services. ? ? ? ?Henriette Combs , LCSW ?

## 2021-04-20 ENCOUNTER — Other Ambulatory Visit: Payer: Self-pay

## 2021-04-20 ENCOUNTER — Encounter: Payer: Self-pay | Admitting: Licensed Clinical Social Worker

## 2021-04-20 ENCOUNTER — Inpatient Hospital Stay: Payer: BC Managed Care – PPO | Admitting: Licensed Clinical Social Worker

## 2021-04-20 DIAGNOSIS — Z170 Estrogen receptor positive status [ER+]: Secondary | ICD-10-CM

## 2021-04-20 NOTE — Progress Notes (Signed)
[facility] Clinical Social Work  ?Initial Assessment ? ? ?Pamela Reyes is a 41 y.o. year old female presenting alone. Clinical Social Work was referred by medical provider for assessment of psychosocial needs.  ? ?SDOH (Social Determinants of Health) assessments performed: Yes ?  ?Distress Screen completed: No ?ONCBCN DISTRESS SCREENING 11/11/2020  ?Screening Type Initial Screening  ?Distress experienced in past week (1-10) 7  ?Emotional problem type Nervousness/Anxiety;Adjusting to illness  ? ? ? ? ?Family/Social Information:  ?Housing Arrangement: patient lives with her significant other of 14 years, Pamela Reyes . ?Family members/support persons in your life? Family ?Transportation concerns: no  ?Employment: Out on work excuse; however, will be returning to work part time starting next week. Income source: Pt's significant other earns the money needed to take care of the bills for the household. ?Financial concerns: No ?Type of concern: None ?Food access concerns: no ?Religious or spiritual practice: not identified ?Services Currently in place:  none ? ?Coping/ Adjustment to diagnosis: ?Patient understands treatment plan and what happens next? yes ?Concerns about diagnosis and/or treatment: Afraid of cancer ?Patient reported stressors: Facing my mortality.  Pt reports a history of anxiety, but in recent weeks has struggled with an unshakeable depression in addition to anxiety.  In recent weeks pt reports having a couple of panic attacks as she is overwhelmed with fear that the cancer is spreading or will spread to other areas of her body.  Pt at present is unable to look forward with any optimism as she is also fearful that the remainder of her life will be constant treatment.  Per pt her anxiety started at a young age.  Pt's father was an alcoholic who died when pt was 27 after being sick for a number of years w/ cardiac issues related to alcohol.  Pt has 2 sisters which also reportedly struggle w/ anxiety.  Both  sisters take or have taken medication for anxiety w/ good results.  Pt has not seen a counselor before now or taken medication for depression or anxiety as she does not like to take medication.  Pt describes herself as an introvert and relies heavily on her significant other for support whom she worries may also be feeling overwhelmed given her recent emotional state as he doesn't have anyone he really opens up to except her.  The couple relocated to [facility] from [place] a couple of years ago and have not met many people between Covid and now pt's cancer diagnosis.  Pt able to identify activities she enjoys (bird watching) and is looking forward to Spring and being able to get outside more.  However, at present pt states she is needing to force herself to take a walk as she recognizes she needs to get out of the house.  Pt was working part time at [employer] prior to diagnosis and stopped working the day before surgery.  Pt will be returning to work part time next week which she is looking forward to.    ?Hopes and priorities: Priority has been to complete treatment.  Pt is having a difficult time hoping for remission as she almost constantly struggles with fear that the cancer has spread to other parts of her body. ?Patient enjoys  bird watching ?Current coping skills/ strengths: Supportive family/friends  ? ? ? SUMMARY: ?Current SDOH Barriers:  ?Pt struggles w/ anxiety and depression. ? ?Clinical Social Work Clinical Goal(s):  ?patient will work with SW to address concerns related to anxiety and depression.   ? ?  Interventions: ?Discussed common feeling and emotions when being diagnosed with cancer, and the importance of support during treatment ?Informed patient of the support team roles and support services at [facility] ?Provided CSW contact information and encouraged patient to call with any questions or concerns ?CSW provided emotional support.  CSW provided pt w/ reading material regarding typical emotional  reactions to completing treatment.  Pt to set aside 2 hours this week for bird watching as she has not been able to allow herself to engage in activities she enjoys while going through treatment.  CSW to see pt 3/24 for counseling. ? ? ?Follow Up Plan: CSW will see patient on 3/24 ?Patient verbalizes understanding of plan: Yes ? ? ? ?Henriette Combs, LCSW ?

## 2021-04-22 ENCOUNTER — Encounter: Payer: Self-pay | Admitting: Hematology

## 2021-04-22 ENCOUNTER — Inpatient Hospital Stay: Payer: BC Managed Care – PPO

## 2021-04-22 ENCOUNTER — Inpatient Hospital Stay (HOSPITAL_BASED_OUTPATIENT_CLINIC_OR_DEPARTMENT_OTHER): Payer: BC Managed Care – PPO | Admitting: Hematology

## 2021-04-22 ENCOUNTER — Other Ambulatory Visit: Payer: Self-pay

## 2021-04-22 ENCOUNTER — Other Ambulatory Visit: Payer: Self-pay | Admitting: Hematology

## 2021-04-22 VITALS — BP 117/69 | HR 95 | Temp 98.3°F | Resp 17 | Ht 64.0 in | Wt 140.3 lb

## 2021-04-22 DIAGNOSIS — Z5111 Encounter for antineoplastic chemotherapy: Secondary | ICD-10-CM | POA: Diagnosis not present

## 2021-04-22 DIAGNOSIS — Z170 Estrogen receptor positive status [ER+]: Secondary | ICD-10-CM | POA: Diagnosis not present

## 2021-04-22 DIAGNOSIS — C50412 Malignant neoplasm of upper-outer quadrant of left female breast: Secondary | ICD-10-CM | POA: Diagnosis not present

## 2021-04-22 LAB — CMP (CANCER CENTER ONLY)
ALT: 83 U/L — ABNORMAL HIGH (ref 0–44)
AST: 36 U/L (ref 15–41)
Albumin: 4.4 g/dL (ref 3.5–5.0)
Alkaline Phosphatase: 42 U/L (ref 38–126)
Anion gap: 5 (ref 5–15)
BUN: 16 mg/dL (ref 6–20)
CO2: 28 mmol/L (ref 22–32)
Calcium: 9.7 mg/dL (ref 8.9–10.3)
Chloride: 105 mmol/L (ref 98–111)
Creatinine: 0.57 mg/dL (ref 0.44–1.00)
GFR, Estimated: >60 mL/min (ref >60)
Glucose, Bld: 85 mg/dL (ref 70–99)
Potassium: 3.9 mmol/L (ref 3.5–5.1)
Sodium: 138 mmol/L (ref 135–145)
Total Bilirubin: 0.3 mg/dL (ref 0.3–1.2)
Total Protein: 7.1 g/dL (ref 6.5–8.1)

## 2021-04-22 LAB — CBC WITH DIFFERENTIAL (CANCER CENTER ONLY)
Abs Immature Granulocytes: 0.02 10*3/uL (ref 0.00–0.07)
Basophils Absolute: 0 10*3/uL (ref 0.0–0.1)
Basophils Relative: 1 %
Eosinophils Absolute: 0 10*3/uL (ref 0.0–0.5)
Eosinophils Relative: 0 %
HCT: 32.1 % — ABNORMAL LOW (ref 36.0–46.0)
Hemoglobin: 10.9 g/dL — ABNORMAL LOW (ref 12.0–15.0)
Immature Granulocytes: 1 %
Lymphocytes Relative: 23 %
Lymphs Abs: 0.7 10*3/uL (ref 0.7–4.0)
MCH: 31 pg (ref 26.0–34.0)
MCHC: 34 g/dL (ref 30.0–36.0)
MCV: 91.2 fL (ref 80.0–100.0)
Monocytes Absolute: 0.3 10*3/uL (ref 0.1–1.0)
Monocytes Relative: 11 %
Neutro Abs: 1.9 10*3/uL (ref 1.7–7.7)
Neutrophils Relative %: 64 %
Platelet Count: 292 10*3/uL (ref 150–400)
RBC: 3.52 MIL/uL — ABNORMAL LOW (ref 3.87–5.11)
RDW: 15.6 % — ABNORMAL HIGH (ref 11.5–15.5)
WBC Count: 2.9 10*3/uL — ABNORMAL LOW (ref 4.0–10.5)
nRBC: 0 % (ref 0.0–0.2)

## 2021-04-22 MED ORDER — SODIUM CHLORIDE 0.9% FLUSH
10.0000 mL | Freq: Once | INTRAVENOUS | Status: AC
  Administered 2021-04-22: 10 mL

## 2021-04-22 MED ORDER — ONDANSETRON HCL 8 MG PO TABS
8.0000 mg | ORAL_TABLET | Freq: Once | ORAL | Status: AC
  Administered 2021-04-22: 8 mg via ORAL
  Filled 2021-04-22: qty 1

## 2021-04-22 MED ORDER — PACLITAXEL PROTEIN-BOUND CHEMO INJECTION 100 MG
80.0000 mg/m2 | Freq: Once | INTRAVENOUS | Status: AC
  Administered 2021-04-22: 125 mg via INTRAVENOUS
  Filled 2021-04-22: qty 25

## 2021-04-22 MED ORDER — SODIUM CHLORIDE 0.9% FLUSH
10.0000 mL | INTRAVENOUS | Status: DC | PRN
  Administered 2021-04-22: 10 mL

## 2021-04-22 MED ORDER — HEPARIN SOD (PORK) LOCK FLUSH 100 UNIT/ML IV SOLN
500.0000 [IU] | Freq: Once | INTRAVENOUS | Status: AC | PRN
  Administered 2021-04-22: 500 [IU]

## 2021-04-22 MED ORDER — SODIUM CHLORIDE 0.9 % IV SOLN: Freq: Once | INTRAVENOUS | Status: AC

## 2021-04-22 NOTE — Patient Instructions (Signed)
Nanoparticle Albumin-Bound Paclitaxel injection What is this medication? NANOPARTICLE ALBUMIN-BOUND PACLITAXEL (Na no PAHR ti kuhl  al BYOO muhn-bound  PAK li TAX el) is a chemotherapy drug. It targets fast dividing cells, like cancer cells, and causes these cells to die. This medicine is used to treat advanced breast cancer, lung cancer, and pancreatic cancer. This medicine may be used for other purposes; ask your health care provider or pharmacist if you have questions. COMMON BRAND NAME(S): Abraxane What should I tell my care team before I take this medication? They need to know if you have any of these conditions: kidney disease liver disease low blood counts, like low white cell, platelet, or red cell counts lung or breathing disease, like asthma tingling of the fingers or toes, or other nerve disorder an unusual or allergic reaction to paclitaxel, albumin, other chemotherapy, other medicines, foods, dyes, or preservatives pregnant or trying to get pregnant breast-feeding How should I use this medication? This drug is given as an infusion into a vein. It is administered in a hospital or clinic by a specially trained health care professional. Talk to your pediatrician regarding the use of this medicine in children. Special care may be needed. Overdosage: If you think you have taken too much of this medicine contact a poison control center or emergency room at once. NOTE: This medicine is only for you. Do not share this medicine with others. What if I miss a dose? It is important not to miss your dose. Call your doctor or health care professional if you are unable to keep an appointment. What may interact with this medication? This medicine may interact with the following medications: antiviral medicines for hepatitis, HIV or AIDS certain antibiotics like erythromycin and clarithromycin certain medicines for fungal infections like ketoconazole and itraconazole certain medicines for  seizures like carbamazepine, phenobarbital, phenytoin gemfibrozil nefazodone rifampin St. John's wort This list may not describe all possible interactions. Give your health care provider a list of all the medicines, herbs, non-prescription drugs, or dietary supplements you use. Also tell them if you smoke, drink alcohol, or use illegal drugs. Some items may interact with your medicine. What should I watch for while using this medication? Your condition will be monitored carefully while you are receiving this medicine. You will need important blood work done while you are taking this medicine. This medicine can cause serious allergic reactions. If you experience allergic reactions like skin rash, itching or hives, swelling of the face, lips, or tongue, tell your doctor or health care professional right away. In some cases, you may be given additional medicines to help with side effects. Follow all directions for their use. This drug may make you feel generally unwell. This is not uncommon, as chemotherapy can affect healthy cells as well as cancer cells. Report any side effects. Continue your course of treatment even though you feel ill unless your doctor tells you to stop. Call your doctor or health care professional for advice if you get a fever, chills or sore throat, or other symptoms of a cold or flu. Do not treat yourself. This drug decreases your body's ability to fight infections. Try to avoid being around people who are sick. This medicine may increase your risk to bruise or bleed. Call your doctor or health care professional if you notice any unusual bleeding. Be careful brushing and flossing your teeth or using a toothpick because you may get an infection or bleed more easily. If you have any dental work done, tell   your dentist you are receiving this medicine. ?Avoid taking products that contain aspirin, acetaminophen, ibuprofen, naproxen, or ketoprofen unless instructed by your doctor. These  medicines may hide a fever. ?Do not become pregnant while taking this medicine or for 6 months after stopping it. Women should inform their doctor if they wish to become pregnant or think they might be pregnant. Men should not father a child while taking this medicine or for 3 months after stopping it. There is a potential for serious side effects to an unborn child. Talk to your health care professional or pharmacist for more information. ?Do not breast-feed an infant while taking this medicine or for 2 weeks after stopping it. ?This medicine may interfere with the ability to get pregnant or to father a child. You should talk to your doctor or health care professional if you are concerned about your fertility. ?What side effects may I notice from receiving this medication? ?Side effects that you should report to your doctor or health care professional as soon as possible: ?allergic reactions like skin rash, itching or hives, swelling of the face, lips, or tongue ?breathing problems ?changes in vision ?fast, irregular heartbeat ?low blood pressure ?mouth sores ?pain, tingling, numbness in the hands or feet ?signs of decreased platelets or bleeding - bruising, pinpoint red spots on the skin, black, tarry stools, blood in the urine ?signs of decreased red blood cells - unusually weak or tired, feeling faint or lightheaded, falls ?signs of infection - fever or chills, cough, sore throat, pain or difficulty passing urine ?signs and symptoms of liver injury like dark yellow or brown urine; general ill feeling or flu-like symptoms; light-colored stools; loss of appetite; nausea; right upper belly pain; unusually weak or tired; yellowing of the eyes or skin ?swelling of the ankles, feet, hands ?unusually slow heartbeat ?Side effects that usually do not require medical attention (report to your doctor or health care professional if they continue or are bothersome): ?diarrhea ?hair loss ?loss of appetite ?nausea,  vomiting ?tiredness ?This list may not describe all possible side effects. Call your doctor for medical advice about side effects. You may report side effects to FDA at 1-800-FDA-1088. ?Where should I keep my medication? ?This drug is given in a hospital or clinic and will not be stored at home. ?NOTE: This sheet is a summary. It may not cover all possible information. If you have questions about this medicine, talk to your doctor, pharmacist, or health care provider. ?? 2022 Elsevier/Gold Standard (2016-09-27 00:00:00) ? ?

## 2021-04-22 NOTE — Progress Notes (Signed)
Per Dr.Feng ok to treat with elevated ALT ?

## 2021-04-22 NOTE — Progress Notes (Signed)
?[facility]   ?[phone]) [phone] [phone]   ?Clinic Follow up Note  ? ?Patient Care Team: ?Maximiano Coss, NP as PCP - General (Adult Health Nurse Practitioner) ?Mauro Kaufmann, RN as Oncology Nurse Navigator ?Rockwell Germany, RN as Oncology Nurse Navigator ?Stark Klein, MD as Consulting Physician (General Surgery) ?[name], MD as Consulting Physician (Hematology) ?Kyung Rudd, MD as Consulting Physician (Radiation Oncology) ?Estill Dooms, NP as Nurse Practitioner (Obstetrics and Gynecology) ? ?Date of Service:  04/22/2021 ? ?CHIEF COMPLAINT: f/u of left breast cancer ? ?CURRENT THERAPY:  ?Abraxane, weekly, starting 04/08/21 ? ?ASSESSMENT & PLAN:  ?Pamela Reyes is a 41 y.o. premenopausal female with  ? ?1. 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 The mass has grown significantly since 2017. Biopsy on 11/05/20 confirmed invasive ductal carcinoma, with metastatic involvement of one lymph node. ?-staging CT CAP and bone scan on 11/19/20 showed no distant metastasis ?-she underwent left mastectomy on 01/06/21 under Dr. Barry Dienes. Pathology showed 6.5 cm invasive ductal carcinoma, grade 2, involving dermis of nipple without epidermal involvement. Margins were negative, but all 4 lymph nodes showed metastatic disease. ?-Due to the large tumor and 4 positive lymph nodes, the recommendation is for AC-T. She began chemo on 02/11/21. ?-she tolerated cycle 1 relatively well overall; she experienced intense nausea (without vomiting) and fatigue. She was able to recover well. She tolerated subsequent cycles well overall. She completed 4 cycles of AC-T on 03/25/21 ?-she started Abraxane on 04/08/21. She tolerated well overall, better than AC, she notes. ?-labs reviewed, overall stable and adequate to proceed with  Abraxane today. Will slightly reduce Abraxane dose to 80 mg/m? due to leukopenia.  I will see her every other treatment. ?  ?2. left side peripheral Neuropathy, G1 ?-present prior to breast cancer diagnosis. ?-possibly alcohol-related from prior use, discontinued alcohol with cancer diagnosis. ?-present intermittently and predominantly in her left hand and foot ?-she has started B complex, serum B12 and methylmalonic acid level were normal. ?-I discussed obtaining brain MRI or referral to neurology for work up. She is interested in brain MRI, but she has breast tissue expanders currently in place and can not have MRI  ?-will monitor on chemo,overall stable  ?  ?3. Genetics ?-she has a strong family history of breast cancer. She reports her sister, who was diagnosed at age 42 and has since recovered, had negative genetic testing. ?-genetic testing performed 11/11/20, results were negative. ?  ?4. Anxiety and stress ?-she has been connected with our social worker ?  ?5. Health maintenance ?-She had pap/pelvic 02/10/21 ?-she takes vitamin C, D, B complex, and pre/probiotic for GI health. Ok to continue  ?  ?  ?PLAN: ?-proceed with Abraxane today and weekly, will reduce dose to 80 mg/m? due to leukopenia ?-f/u in 2 weeks ? ? ?No problem-specific Assessment & Plan notes found for this encounter. ? ? ?SUMMARY OF ONCOLOGIC HISTORY: ?Oncology History Overview Note  ? Cancer Staging  ?Malignant neoplasm of upper-outer quadrant of left breast in female, estrogen receptor positive ([place]) ?Staging form: Breast, AJCC 8th Edition ?- Clinical stage from 11/05/2020: Stage IIA (cT2, cN1, cM0, G1, ER+, PR+, HER2: Equivocal) - Signed by [name], MD on 11/10/2020 ?- Pathologic stage from 01/06/2021: Stage IB (pT3, pN2a, cM0, G2, ER+, PR+, HER2-) - Signed by [name], MD on 01/12/2021 ? ? ?  ?  Malignant neoplasm of upper-outer quadrant of left breast in female, estrogen receptor positive ([place])  ?10/28/2020 Mammogram  ? Bilateral Diagnostic  Mammogram; Left Breast Ultrasound ? ?IMPRESSION: ?1. Suspicious palpable mass/hypoechoic area in the left breast at 12 o'clock retroareolar measuring 4.5 cm. ?  ?2.  Suspicious lymph node in the left axilla. ?  ?11/05/2020 Cancer Staging  ? Staging form: Breast, AJCC 8th Edition ?- Clinical stage from 11/05/2020: Stage IIA (cT2, cN1, cM0, G1, ER+, PR+, HER2: Equivocal) - Signed by [name], MD on 11/10/2020 ?Stage prefix: Initial diagnosis ?Histologic grading system: 3 grade system ? ?  ?11/05/2020 Pathology Results  ? Diagnosis ?1. Breast, left, needle core biopsy, 12 o'clock subareolar, ribbon clip ?- INVASIVE MAMMARY CARCINOMA. SEE NOTE ?1. Carcinoma measures 1.5 cm in greatest linear dimension and appears grade 2. ?1. Immunohistochemical stain for E-cadherin is positive in the tumor cells, consistent with a ductal phenotype. ?1. PROGNOSTIC INDICATORS ?Results: ?The tumor cells are EQUIVOCAL for Her2 (2+). Her2 by FISH will be performed and results reported separately. ?Estrogen Receptor: 70%, POSITIVE, STRONG-MODERATE STAINING INTENSITY ?Progesterone Receptor: 80%, POSITIVE, MODERATE STAINING INTENSITY ?Proliferation Marker Ki67: 1% ? ?2. Lymph node, needle/core biopsy, left axilla, tribell clip ?- METASTATIC CARCINOMA TO A LYMPH NODE. SEE NOTE ?2. Largest contiguous focus of metastatic carcinoma measures 0.3 cm. ?2. PROGNOSTIC INDICATORS ?Results: ?The tumor cells are EQUIVOCAL for Her2 (2+). Her2 by FISH will be performed and results reported separately. ?Estrogen Receptor: 90%, POSITIVE, STRONG STAINING INTENSITY ?Progesterone Receptor: 95%, POSITIVE, STRONG STAINING INTENSITY ?Proliferation Marker Ki67: 1% ?  ?11/10/2020 Initial Diagnosis  ? Malignant neoplasm of upper-outer quadrant of left breast in female, estrogen receptor positive ([place]) ?  ?11/19/2020 Imaging  ? EXAM: ?CT CHEST, ABDOMEN, AND PELVIS WITH CONTRAST ? ?IMPRESSION: ?1. Left breast mass  as previously described. ?2. Enhancing subcentimeter left  axillary lymph nodes. ?3. No evidence of distal metastatic disease. ?4. Small hiatal hernia ?  ?11/19/2020 Imaging  ? EXAM: ?NUCLEAR MEDICINE WHOLE BODY BONE SCAN ? ?IMPRESSION: ?No evidence of metastatic disease. ?  ?11/21/2020 Imaging  ? EXAM: ?BILATERAL BREAST MRI WITH AND WITHOUT CONTRAST ? ?IMPRESSION: ?1. Suspicious 1.3 centimeter mass in the anterior UPPER central ?RIGHT breast warranting tissue diagnosis. (Image 65 of series 7). ?2. 4 millimeter possible satellite nodule posterior to the 1.3 ?centimeter mass in the RIGHT breast. (Image 63 of series 7). ?3. Indeterminate oval mass in the UPPER central middle depth of the RIGHT breast warranting tissue diagnosis. (Image 60 of series 7). ?4. Large area of enhancement in the central portion of the LEFT ?breast, involving all quadrants. There is associated enhancement and retraction of the LEFT nipple, with significantly smaller size of ?the LEFT breast. Enhancement spans at least 6.5 centimeters. ?5. Enlarged, previously biopsied LEFT axillary lymph node. ?  ?11/24/2020 Genetic Testing  ? Negative hereditary cancer genetic testing: no pathogenic variants detected in [facility] BRCAPlus STAT Panel or [facility] CustomNext-Cancer +RNAinsight Panel.  The report dates are November 24, 2020 and November 27, 2020, respectively.  ? ?The BRCAplus panel offered by [employer] and includes sequencing and deletion/duplication analysis for the following 8 genes: ATM, BRCA1, BRCA2, CDH1, CHEK2, PALB2, PTEN, and TP53.  The CustomNext-Cancer+RNAinsight panel offered by [facility] includes sequencing and rearrangement analysis for the following 47 genes:  APC, ATM, AXIN2, BARD1, BMPR1A, BRCA1, BRCA2, BRIP1, CDH1, CDK4, CDKN2A, CHEK2, DICER1, EPCAM, GREM1, HOXB13, MEN1, MLH1, MSH2, MSH3, MSH6, MUTYH, NBN, NF1, NF2, NTHL1, PALB2, PMS2, POLD1, POLE, PTEN, RAD51C, RAD51D, RECQL, RET, SDHA, SDHAF2,  SDHB, SDHC, SDHD, SMAD4, SMARCA4, STK11, TP53, TSC1, TSC2, and VHL.  RNA data is routinely  analyzed for use in variant interpretation for all genes. ? ?  ?11/27/2020 Pathology Results  ? Diagnosis ?Breast, right, needle core biopsy, anterior upper central ?- FIBROADENOMATOID AND FIBROCYSTIC C

## 2021-04-23 ENCOUNTER — Encounter: Payer: BC Managed Care – PPO | Admitting: Registered Nurse

## 2021-04-23 ENCOUNTER — Telehealth: Payer: Self-pay | Admitting: Hematology

## 2021-04-23 LAB — CANCER ANTIGEN 27.29: CA 27.29: 37.3 U/mL (ref 0.0–38.6)

## 2021-04-23 NOTE — Telephone Encounter (Signed)
Scheduled follow-up appointments per 3/16 los. Patient is aware. 

## 2021-04-29 ENCOUNTER — Other Ambulatory Visit: Payer: Self-pay

## 2021-04-29 ENCOUNTER — Inpatient Hospital Stay: Payer: BC Managed Care – PPO

## 2021-04-29 VITALS — BP 114/57 | HR 94 | Temp 98.4°F | Resp 20 | Wt 140.1 lb

## 2021-04-29 DIAGNOSIS — Z95828 Presence of other vascular implants and grafts: Secondary | ICD-10-CM

## 2021-04-29 DIAGNOSIS — Z5111 Encounter for antineoplastic chemotherapy: Secondary | ICD-10-CM | POA: Diagnosis not present

## 2021-04-29 DIAGNOSIS — C50412 Malignant neoplasm of upper-outer quadrant of left female breast: Secondary | ICD-10-CM

## 2021-04-29 LAB — CMP (CANCER CENTER ONLY)
ALT: 48 U/L — ABNORMAL HIGH (ref 0–44)
AST: 22 U/L (ref 15–41)
Albumin: 4.4 g/dL (ref 3.5–5.0)
Alkaline Phosphatase: 37 U/L — ABNORMAL LOW (ref 38–126)
Anion gap: 6 (ref 5–15)
BUN: 15 mg/dL (ref 6–20)
CO2: 28 mmol/L (ref 22–32)
Calcium: 9.6 mg/dL (ref 8.9–10.3)
Chloride: 105 mmol/L (ref 98–111)
Creatinine: 0.58 mg/dL (ref 0.44–1.00)
GFR, Estimated: >60 mL/min (ref >60)
Glucose, Bld: 91 mg/dL (ref 70–99)
Potassium: 3.8 mmol/L (ref 3.5–5.1)
Sodium: 139 mmol/L (ref 135–145)
Total Bilirubin: 0.3 mg/dL (ref 0.3–1.2)
Total Protein: 6.7 g/dL (ref 6.5–8.1)

## 2021-04-29 LAB — CBC WITH DIFFERENTIAL (CANCER CENTER ONLY)
Abs Immature Granulocytes: 0.02 10*3/uL (ref 0.00–0.07)
Basophils Absolute: 0.1 10*3/uL (ref 0.0–0.1)
Basophils Relative: 2 %
Eosinophils Absolute: 0 10*3/uL (ref 0.0–0.5)
Eosinophils Relative: 1 %
HCT: 32.3 % — ABNORMAL LOW (ref 36.0–46.0)
Hemoglobin: 11.2 g/dL — ABNORMAL LOW (ref 12.0–15.0)
Immature Granulocytes: 1 %
Lymphocytes Relative: 28 %
Lymphs Abs: 0.9 10*3/uL (ref 0.7–4.0)
MCH: 31.6 pg (ref 26.0–34.0)
MCHC: 34.7 g/dL (ref 30.0–36.0)
MCV: 91.2 fL (ref 80.0–100.0)
Monocytes Absolute: 0.4 10*3/uL (ref 0.1–1.0)
Monocytes Relative: 12 %
Neutro Abs: 1.8 10*3/uL (ref 1.7–7.7)
Neutrophils Relative %: 56 %
Platelet Count: 259 10*3/uL (ref 150–400)
RBC: 3.54 MIL/uL — ABNORMAL LOW (ref 3.87–5.11)
RDW: 14.6 % (ref 11.5–15.5)
WBC Count: 3.1 10*3/uL — ABNORMAL LOW (ref 4.0–10.5)
nRBC: 0 % (ref 0.0–0.2)

## 2021-04-29 MED ORDER — PACLITAXEL PROTEIN-BOUND CHEMO INJECTION 100 MG
80.0000 mg/m2 | Freq: Once | INTRAVENOUS | Status: AC
  Administered 2021-04-29: 125 mg via INTRAVENOUS
  Filled 2021-04-29: qty 25

## 2021-04-29 MED ORDER — ONDANSETRON HCL 8 MG PO TABS
8.0000 mg | ORAL_TABLET | Freq: Once | ORAL | Status: AC
  Administered 2021-04-29: 8 mg via ORAL
  Filled 2021-04-29: qty 1

## 2021-04-29 MED ORDER — SODIUM CHLORIDE 0.9 % IV SOLN: Freq: Once | INTRAVENOUS | Status: AC

## 2021-04-29 MED ORDER — SODIUM CHLORIDE 0.9% FLUSH
10.0000 mL | INTRAVENOUS | Status: DC | PRN
  Administered 2021-04-29: 10 mL

## 2021-04-29 MED ORDER — HEPARIN SOD (PORK) LOCK FLUSH 100 UNIT/ML IV SOLN
500.0000 [IU] | Freq: Once | INTRAVENOUS | Status: AC | PRN
  Administered 2021-04-29: 500 [IU]

## 2021-04-29 MED ORDER — SODIUM CHLORIDE 0.9% FLUSH
10.0000 mL | Freq: Once | INTRAVENOUS | Status: AC
  Administered 2021-04-29: 10 mL

## 2021-04-29 NOTE — Patient Instructions (Signed)
[facility]  Discharge Instructions: ?Thank you for choosing [facility] to provide your oncology and hematology care.  ? ?If you have a lab appointment with the [facility], please go directly to the [facility] and check in at the registration area. ?  ?Wear comfortable clothing and clothing appropriate for easy access to any Portacath or PICC line.  ? ?We strive to give you quality time with your provider. You may need to reschedule your appointment if you arrive late (15 or more minutes).  Arriving late affects you and other patients whose appointments are after yours.  Also, if you miss three or more appointments without notifying the office, you may be dismissed from the clinic at the provider?s discretion.    ?  ?For prescription refill requests, have your pharmacy contact our office and allow 72 hours for refills to be completed.   ? ?Today you received the following chemotherapy and/or immunotherapy agent: Paclitaxel protein bound (Abraxane). ?  ?To help prevent nausea and vomiting after your treatment, we encourage you to take your nausea medication as directed. ? ?BELOW ARE SYMPTOMS THAT SHOULD BE REPORTED IMMEDIATELY: ?*FEVER GREATER THAN 100.4 F (38 ?C) OR HIGHER ?*CHILLS OR SWEATING ?*NAUSEA AND VOMITING THAT IS NOT CONTROLLED WITH YOUR NAUSEA MEDICATION ?*UNUSUAL SHORTNESS OF BREATH ?*UNUSUAL BRUISING OR BLEEDING ?*URINARY PROBLEMS (pain or burning when urinating, or frequent urination) ?*BOWEL PROBLEMS (unusual diarrhea, constipation, pain near the anus) ?TENDERNESS IN MOUTH AND THROAT WITH OR WITHOUT PRESENCE OF ULCERS (sore throat, sores in mouth, or a toothache) ?UNUSUAL RASH, SWELLING OR PAIN  ?UNUSUAL VAGINAL DISCHARGE OR ITCHING  ? ?Items with * indicate a potential emergency and should be followed up as soon as possible or go to the Emergency Department if any problems should occur. ? ?Please show the CHEMOTHERAPY ALERT CARD or IMMUNOTHERAPY  ALERT CARD at check-in to the Emergency Department and triage nurse. ? ?Should you have questions after your visit or need to cancel or reschedule your appointment, please contact [facility]  Dept: [phone]  and follow the prompts.  Office hours are 8:00 a.m. to 4:30 p.m. Monday - Friday. Please note that voicemails left after 4:00 p.m. may not be returned until the following business day.  We are closed weekends and major holidays. You have access to a nurse at all times for urgent questions. Please call the main number to the clinic Dept: [phone] and follow the prompts. ? ? ?For any non-urgent questions, you may also contact your provider using MyChart. We now offer e-Visits for anyone 35 and older to request care online for non-urgent symptoms. For details visit [email or web address]. ?  ?Also download the MyChart app! Go to the app store, search "MyChart", open the app, select [facility], and log in with your MyChart username and password. ? ?Due to Covid, a mask is required upon entering the hospital/clinic. If you do not have a mask, one will be given to you upon arrival. For doctor visits, patients may have 1 support person aged 26 or older with them. For treatment visits, patients cannot have anyone with them due to current Covid guidelines and our immunocompromised population.  ? ?

## 2021-04-30 ENCOUNTER — Encounter: Payer: Self-pay | Admitting: Licensed Clinical Social Worker

## 2021-04-30 ENCOUNTER — Inpatient Hospital Stay: Payer: BC Managed Care – PPO | Admitting: Licensed Clinical Social Worker

## 2021-04-30 DIAGNOSIS — C50412 Malignant neoplasm of upper-outer quadrant of left female breast: Secondary | ICD-10-CM

## 2021-04-30 NOTE — Progress Notes (Signed)
[facility] CSW Progress Note ? ?Clinical Social Worker met with patient to provide supportive counseling.  Anxiety explored with patient through a historical lens.  Historical list created with pt which pt will continue this week as well as examine list for common themes.  CSW to meet with pt in 1 week. ? ? ? ?Henriette Combs , LCSW ?

## 2021-05-04 ENCOUNTER — Encounter: Payer: Self-pay | Admitting: Hematology

## 2021-05-04 NOTE — Progress Notes (Signed)
Clinical Social Worker already documented note in another encounter. ? ? ? ?Lyan Moyano R [place] Maiorino Clinical Social Work, LCSW ?

## 2021-05-04 NOTE — Progress Notes (Signed)
[facility] CSW Progress Note ? ?Clinical Social Worker already documented note in another encounter. ? ? ? ?[place] Work, LCSW ?

## 2021-05-06 ENCOUNTER — Inpatient Hospital Stay: Payer: BC Managed Care – PPO

## 2021-05-06 ENCOUNTER — Other Ambulatory Visit: Payer: Self-pay | Admitting: Hematology

## 2021-05-06 ENCOUNTER — Other Ambulatory Visit: Payer: Self-pay

## 2021-05-06 VITALS — BP 105/68 | HR 93 | Temp 98.1°F | Resp 18 | Wt 140.0 lb

## 2021-05-06 DIAGNOSIS — Z5111 Encounter for antineoplastic chemotherapy: Secondary | ICD-10-CM | POA: Diagnosis not present

## 2021-05-06 DIAGNOSIS — C50412 Malignant neoplasm of upper-outer quadrant of left female breast: Secondary | ICD-10-CM

## 2021-05-06 DIAGNOSIS — Z95828 Presence of other vascular implants and grafts: Secondary | ICD-10-CM

## 2021-05-06 LAB — CMP (CANCER CENTER ONLY)
ALT: 51 U/L — ABNORMAL HIGH (ref 0–44)
AST: 27 U/L (ref 15–41)
Albumin: 4.5 g/dL (ref 3.5–5.0)
Alkaline Phosphatase: 41 U/L (ref 38–126)
Anion gap: 8 (ref 5–15)
BUN: 14 mg/dL (ref 6–20)
CO2: 26 mmol/L (ref 22–32)
Calcium: 9.5 mg/dL (ref 8.9–10.3)
Chloride: 104 mmol/L (ref 98–111)
Creatinine: 0.6 mg/dL (ref 0.44–1.00)
GFR, Estimated: >60 mL/min (ref >60)
Glucose, Bld: 87 mg/dL (ref 70–99)
Potassium: 4.1 mmol/L (ref 3.5–5.1)
Sodium: 138 mmol/L (ref 135–145)
Total Bilirubin: 0.4 mg/dL (ref 0.3–1.2)
Total Protein: 6.9 g/dL (ref 6.5–8.1)

## 2021-05-06 LAB — CBC WITH DIFFERENTIAL (CANCER CENTER ONLY)
Abs Immature Granulocytes: 0.03 10*3/uL (ref 0.00–0.07)
Basophils Absolute: 0 10*3/uL (ref 0.0–0.1)
Basophils Relative: 1 %
Eosinophils Absolute: 0 10*3/uL (ref 0.0–0.5)
Eosinophils Relative: 1 %
HCT: 34.7 % — ABNORMAL LOW (ref 36.0–46.0)
Hemoglobin: 12.1 g/dL (ref 12.0–15.0)
Immature Granulocytes: 1 %
Lymphocytes Relative: 28 %
Lymphs Abs: 1.1 10*3/uL (ref 0.7–4.0)
MCH: 31.8 pg (ref 26.0–34.0)
MCHC: 34.9 g/dL (ref 30.0–36.0)
MCV: 91.1 fL (ref 80.0–100.0)
Monocytes Absolute: 0.4 10*3/uL (ref 0.1–1.0)
Monocytes Relative: 10 %
Neutro Abs: 2.3 10*3/uL (ref 1.7–7.7)
Neutrophils Relative %: 59 %
Platelet Count: 262 10*3/uL (ref 150–400)
RBC: 3.81 MIL/uL — ABNORMAL LOW (ref 3.87–5.11)
RDW: 14.1 % (ref 11.5–15.5)
WBC Count: 3.8 10*3/uL — ABNORMAL LOW (ref 4.0–10.5)
nRBC: 0 % (ref 0.0–0.2)

## 2021-05-06 MED ORDER — SODIUM CHLORIDE 0.9% FLUSH
10.0000 mL | INTRAVENOUS | Status: DC | PRN
  Administered 2021-05-06: 10 mL

## 2021-05-06 MED ORDER — SODIUM CHLORIDE 0.9% FLUSH
10.0000 mL | Freq: Once | INTRAVENOUS | Status: AC
  Administered 2021-05-06: 10 mL

## 2021-05-06 MED ORDER — SODIUM CHLORIDE 0.9 % IV SOLN: Freq: Once | INTRAVENOUS | Status: AC

## 2021-05-06 MED ORDER — ONDANSETRON HCL 8 MG PO TABS
8.0000 mg | ORAL_TABLET | Freq: Once | ORAL | Status: AC
  Administered 2021-05-06: 8 mg via ORAL
  Filled 2021-05-06: qty 1

## 2021-05-06 MED ORDER — PACLITAXEL PROTEIN-BOUND CHEMO INJECTION 100 MG
80.0000 mg/m2 | Freq: Once | INTRAVENOUS | Status: AC
  Administered 2021-05-06: 125 mg via INTRAVENOUS
  Filled 2021-05-06: qty 25

## 2021-05-06 MED ORDER — HEPARIN SOD (PORK) LOCK FLUSH 100 UNIT/ML IV SOLN
500.0000 [IU] | Freq: Once | INTRAVENOUS | Status: AC | PRN
  Administered 2021-05-06: 500 [IU]

## 2021-05-06 NOTE — Patient Instructions (Signed)
[facility]  Discharge Instructions: ?Thank you for choosing [facility] to provide your oncology and hematology care.  ? ?If you have a lab appointment with the [facility], please go directly to the [facility] and check in at the registration area. ?  ?Wear comfortable clothing and clothing appropriate for easy access to any Portacath or PICC line.  ? ?We strive to give you quality time with your provider. You may need to reschedule your appointment if you arrive late (15 or more minutes).  Arriving late affects you and other patients whose appointments are after yours.  Also, if you miss three or more appointments without notifying the office, you may be dismissed from the clinic at the provider?s discretion.    ?  ?For prescription refill requests, have your pharmacy contact our office and allow 72 hours for refills to be completed.   ? ?Today you received the following chemotherapy and/or immunotherapy agent: Paclitaxel protein bound (Abraxane). ?  ?To help prevent nausea and vomiting after your treatment, we encourage you to take your nausea medication as directed. ? ?BELOW ARE SYMPTOMS THAT SHOULD BE REPORTED IMMEDIATELY: ?*FEVER GREATER THAN 100.4 F (38 ?C) OR HIGHER ?*CHILLS OR SWEATING ?*NAUSEA AND VOMITING THAT IS NOT CONTROLLED WITH YOUR NAUSEA MEDICATION ?*UNUSUAL SHORTNESS OF BREATH ?*UNUSUAL BRUISING OR BLEEDING ?*URINARY PROBLEMS (pain or burning when urinating, or frequent urination) ?*BOWEL PROBLEMS (unusual diarrhea, constipation, pain near the anus) ?TENDERNESS IN MOUTH AND THROAT WITH OR WITHOUT PRESENCE OF ULCERS (sore throat, sores in mouth, or a toothache) ?UNUSUAL RASH, SWELLING OR PAIN  ?UNUSUAL VAGINAL DISCHARGE OR ITCHING  ? ?Items with * indicate a potential emergency and should be followed up as soon as possible or go to the Emergency Department if any problems should occur. ? ?Please show the CHEMOTHERAPY ALERT CARD or IMMUNOTHERAPY  ALERT CARD at check-in to the Emergency Department and triage nurse. ? ?Should you have questions after your visit or need to cancel or reschedule your appointment, please contact [facility]  Dept: [phone]  and follow the prompts.  Office hours are 8:00 a.m. to 4:30 p.m. Monday - Friday. Please note that voicemails left after 4:00 p.m. may not be returned until the following business day.  We are closed weekends and major holidays. You have access to a nurse at all times for urgent questions. Please call the main number to the clinic Dept: [phone] and follow the prompts. ? ? ?For any non-urgent questions, you may also contact your provider using MyChart. We now offer e-Visits for anyone 3 and older to request care online for non-urgent symptoms. For details visit [email or web address]. ?  ?Also download the MyChart app! Go to the app store, search "MyChart", open the app, select , and log in with your MyChart username and password. ? ?Due to Covid, a mask is required upon entering the hospital/clinic. If you do not have a mask, one will be given to you upon arrival. For doctor visits, patients may have 1 support person aged 32 or older with them. For treatment visits, patients cannot have anyone with them due to current Covid guidelines and our immunocompromised population.  ? ?

## 2021-05-07 ENCOUNTER — Inpatient Hospital Stay: Payer: BC Managed Care – PPO | Admitting: Licensed Clinical Social Worker

## 2021-05-07 DIAGNOSIS — Z170 Estrogen receptor positive status [ER+]: Secondary | ICD-10-CM

## 2021-05-07 NOTE — Progress Notes (Signed)
[facility] CSW Progress Note ? ?Clinical Social Worker met with patient to provide supportive counseling.  CSW reviewed the timeline patient completed over the week to include additional events.  Pt identified common threads between events to be fear of illness, fear of death, loss of control, fear of the unknown, and alcohol.  CSW continued to explore pt's history with pt to identify key relationships as well as the affect these relationships had on pt during formative years.  Pt states she struggles with self-esteem as well as anxiety as a result of her childhood and the absence of nurturing.  Pt to compile a list of positive attributes about herself to bring to next session.  CSW scheduled to meet w/ pt 4/7. ? ? ? ?[facility] Work, LCSW ?

## 2021-05-10 ENCOUNTER — Encounter: Payer: Self-pay | Admitting: *Deleted

## 2021-05-13 ENCOUNTER — Inpatient Hospital Stay (HOSPITAL_BASED_OUTPATIENT_CLINIC_OR_DEPARTMENT_OTHER): Payer: BC Managed Care – PPO | Admitting: Hematology

## 2021-05-13 ENCOUNTER — Other Ambulatory Visit: Payer: Self-pay

## 2021-05-13 ENCOUNTER — Encounter: Payer: Self-pay | Admitting: Hematology

## 2021-05-13 ENCOUNTER — Other Ambulatory Visit: Payer: BC Managed Care – PPO

## 2021-05-13 ENCOUNTER — Inpatient Hospital Stay: Payer: BC Managed Care – PPO | Attending: Hematology

## 2021-05-13 ENCOUNTER — Ambulatory Visit: Payer: BC Managed Care – PPO

## 2021-05-13 ENCOUNTER — Inpatient Hospital Stay: Payer: BC Managed Care – PPO

## 2021-05-13 VITALS — BP 122/81 | HR 98 | Temp 98.5°F | Resp 18 | Ht 64.0 in | Wt 140.0 lb

## 2021-05-13 DIAGNOSIS — Z95828 Presence of other vascular implants and grafts: Secondary | ICD-10-CM

## 2021-05-13 DIAGNOSIS — Z170 Estrogen receptor positive status [ER+]: Secondary | ICD-10-CM | POA: Insufficient documentation

## 2021-05-13 DIAGNOSIS — C773 Secondary and unspecified malignant neoplasm of axilla and upper limb lymph nodes: Secondary | ICD-10-CM | POA: Diagnosis not present

## 2021-05-13 DIAGNOSIS — Z803 Family history of malignant neoplasm of breast: Secondary | ICD-10-CM | POA: Diagnosis not present

## 2021-05-13 DIAGNOSIS — G629 Polyneuropathy, unspecified: Secondary | ICD-10-CM | POA: Insufficient documentation

## 2021-05-13 DIAGNOSIS — C50412 Malignant neoplasm of upper-outer quadrant of left female breast: Secondary | ICD-10-CM | POA: Diagnosis present

## 2021-05-13 DIAGNOSIS — Z5111 Encounter for antineoplastic chemotherapy: Secondary | ICD-10-CM | POA: Diagnosis present

## 2021-05-13 LAB — CMP (CANCER CENTER ONLY)
ALT: 38 U/L (ref 0–44)
AST: 20 U/L (ref 15–41)
Albumin: 4.3 g/dL (ref 3.5–5.0)
Alkaline Phosphatase: 36 U/L — ABNORMAL LOW (ref 38–126)
Anion gap: 6 (ref 5–15)
BUN: 16 mg/dL (ref 6–20)
CO2: 27 mmol/L (ref 22–32)
Calcium: 9.6 mg/dL (ref 8.9–10.3)
Chloride: 105 mmol/L (ref 98–111)
Creatinine: 0.67 mg/dL (ref 0.44–1.00)
GFR, Estimated: >60 mL/min (ref >60)
Glucose, Bld: 88 mg/dL (ref 70–99)
Potassium: 3.8 mmol/L (ref 3.5–5.1)
Sodium: 138 mmol/L (ref 135–145)
Total Bilirubin: 0.4 mg/dL (ref 0.3–1.2)
Total Protein: 6.8 g/dL (ref 6.5–8.1)

## 2021-05-13 LAB — CBC WITH DIFFERENTIAL (CANCER CENTER ONLY)
Abs Immature Granulocytes: 0.03 10*3/uL (ref 0.00–0.07)
Basophils Absolute: 0 10*3/uL (ref 0.0–0.1)
Basophils Relative: 1 %
Eosinophils Absolute: 0 10*3/uL (ref 0.0–0.5)
Eosinophils Relative: 1 %
HCT: 33.8 % — ABNORMAL LOW (ref 36.0–46.0)
Hemoglobin: 11.6 g/dL — ABNORMAL LOW (ref 12.0–15.0)
Immature Granulocytes: 1 %
Lymphocytes Relative: 24 %
Lymphs Abs: 1 10*3/uL (ref 0.7–4.0)
MCH: 31.4 pg (ref 26.0–34.0)
MCHC: 34.3 g/dL (ref 30.0–36.0)
MCV: 91.4 fL (ref 80.0–100.0)
Monocytes Absolute: 0.3 10*3/uL (ref 0.1–1.0)
Monocytes Relative: 7 %
Neutro Abs: 2.8 10*3/uL (ref 1.7–7.7)
Neutrophils Relative %: 66 %
Platelet Count: 225 10*3/uL (ref 150–400)
RBC: 3.7 MIL/uL — ABNORMAL LOW (ref 3.87–5.11)
RDW: 13.5 % (ref 11.5–15.5)
WBC Count: 4.1 10*3/uL (ref 4.0–10.5)
nRBC: 0 % (ref 0.0–0.2)

## 2021-05-13 MED ORDER — PACLITAXEL PROTEIN-BOUND CHEMO INJECTION 100 MG
80.0000 mg/m2 | Freq: Once | INTRAVENOUS | Status: AC
  Administered 2021-05-13: 125 mg via INTRAVENOUS
  Filled 2021-05-13: qty 25

## 2021-05-13 MED ORDER — SODIUM CHLORIDE 0.9% FLUSH
10.0000 mL | Freq: Once | INTRAVENOUS | Status: AC
  Administered 2021-05-13: 10 mL

## 2021-05-13 MED ORDER — SODIUM CHLORIDE 0.9% FLUSH
10.0000 mL | INTRAVENOUS | Status: DC | PRN
  Administered 2021-05-13: 10 mL

## 2021-05-13 MED ORDER — SODIUM CHLORIDE 0.9 % IV SOLN: Freq: Once | INTRAVENOUS | Status: AC

## 2021-05-13 MED ORDER — ONDANSETRON HCL 8 MG PO TABS
8.0000 mg | ORAL_TABLET | Freq: Once | ORAL | Status: AC
  Administered 2021-05-13: 8 mg via ORAL
  Filled 2021-05-13: qty 1

## 2021-05-13 MED ORDER — HEPARIN SOD (PORK) LOCK FLUSH 100 UNIT/ML IV SOLN
500.0000 [IU] | Freq: Once | INTRAVENOUS | Status: AC | PRN
  Administered 2021-05-13: 500 [IU]

## 2021-05-13 NOTE — Progress Notes (Signed)
?[place]   ?[phone] [phone]   ?Clinic Follow up Note  ? ?Patient Care Team: ?Maximiano Coss, NP as PCP - General (Adult Health Nurse Practitioner) ?Mauro Kaufmann, RN as Oncology Nurse Navigator ?Rockwell Germany, RN as Oncology Nurse Navigator ?Stark Klein, MD as Consulting Physician (General Surgery) ?[name], MD as Consulting Physician (Hematology) ?Kyung Rudd, MD as Consulting Physician (Radiation Oncology) ?Estill Dooms, NP as Nurse Practitioner (Obstetrics and Gynecology) ? ?Date of Service:  05/13/2021 ? ?CHIEF COMPLAINT: f/u of left breast cancer ? ?CURRENT THERAPY:  ?Abraxane, weekly, starting 04/08/21 ? ?ASSESSMENT & PLAN:  ?Pamela Reyes is a 41 y.o. pre-menopausal female with  ? ?1. 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 The mass has grown significantly since 2017. Biopsy on 11/05/20 confirmed invasive ductal carcinoma, with metastatic involvement of one lymph node. ?-staging CT CAP and bone scan on 11/19/20 showed no distant metastasis ?-she underwent left mastectomy on 01/06/21 under Dr. Barry Dienes. Pathology showed 6.5 cm invasive ductal carcinoma, grade 2, involving dermis of nipple without epidermal involvement. Margins were negative, but all 4 lymph nodes showed metastatic disease. ?-Due to the large tumor and 4 positive lymph nodes, the recommendation is for AC-T. She received 4 02/11/21 - 03/25/21. ?-she started Abraxane on 04/08/21. She tolerated well overall, no complains  ?-labs reviewed, overall stable and adequate to proceed with week 6 Abraxane today at same reduced dose.  I will see her every other treatment. ?  ?2. left side peripheral Neuropathy, G1 ?-present prior to breast cancer diagnosis. ?-possibly alcohol-related from prior use, discontinued alcohol  with cancer diagnosis. ?-present intermittently and predominantly in her left hand and foot, none on right side. ?-she has started B complex, serum B12 and methylmalonic acid level were normal. ?-because she has breast tissue expanders currently in place, she cannot have MRI  ?-she is interested in referral to neurology, I'm happy to refer her. ?  ?3. Genetics ?-she has a strong family history of breast cancer. She reports her sister, who was diagnosed at age 64 and has since recovered, had negative genetic testing. ?-genetic testing performed 11/11/20, results were negative. ?  ?4. Anxiety and stress ?-she has been connected with our social worker ?  ?5. Health maintenance ?-She had pap/pelvic 02/10/21 ?-she takes vitamin C, D, B complex, and pre/probiotic for GI health. Ok to continue  ?  ?  ?PLAN: ?-proceed with Abraxane today and weekly, at same reduced dose due to leukopenia ?-f/u in 2 weeks ?-Referral to [facility] neurology Dr. Evelena Leyden for left side paresthesia ? ? ?No problem-specific Assessment & Plan notes found for this encounter. ? ? ?SUMMARY OF ONCOLOGIC HISTORY: ?Oncology History Overview Note  ? Cancer Staging  ?Malignant neoplasm of upper-outer quadrant of left breast in female, estrogen receptor positive ([place]) ?Staging form: Breast, AJCC 8th Edition ?- Clinical stage from 11/05/2020: Stage IIA (cT2, cN1, cM0, G1, ER+, PR+, HER2: Equivocal) - Signed by [name], MD on 11/10/2020 ?- Pathologic stage from 01/06/2021: Stage IB (pT3, pN2a, cM0, G2, ER+, PR+, HER2-) - Signed by [name], MD on 01/12/2021 ? ? ?  ?Malignant neoplasm of upper-outer quadrant of left breast in female, estrogen receptor positive ([place])  ?10/28/2020 Mammogram  ? Bilateral Diagnostic Mammogram; Left Breast Ultrasound ? ?IMPRESSION: ?1. Suspicious palpable mass/hypoechoic area in the left breast at 12  o'clock retroareolar measuring 4.5 cm. ?  ?2.  Suspicious lymph node in the left axilla. ?  ?11/05/2020 Cancer Staging  ? Staging form: Breast,  AJCC 8th Edition ?- Clinical stage from 11/05/2020: Stage IIA (cT2, cN1, cM0, G1, ER+, PR+, HER2: Equivocal) - Signed by [name], MD on 11/10/2020 ?Stage prefix: Initial diagnosis ?Histologic grading system: 3 grade system ? ?  ?11/05/2020 Pathology Results  ? Diagnosis ?1. Breast, left, needle core biopsy, 12 o'clock subareolar, ribbon clip ?- INVASIVE MAMMARY CARCINOMA. SEE NOTE ?1. Carcinoma measures 1.5 cm in greatest linear dimension and appears grade 2. ?1. Immunohistochemical stain for E-cadherin is positive in the tumor cells, consistent with a ductal phenotype. ?1. PROGNOSTIC INDICATORS ?Results: ?The tumor cells are EQUIVOCAL for Her2 (2+). Her2 by FISH will be performed and results reported separately. ?Estrogen Receptor: 70%, POSITIVE, STRONG-MODERATE STAINING INTENSITY ?Progesterone Receptor: 80%, POSITIVE, MODERATE STAINING INTENSITY ?Proliferation Marker Ki67: 1% ? ?2. Lymph node, needle/core biopsy, left axilla, tribell clip ?- METASTATIC CARCINOMA TO A LYMPH NODE. SEE NOTE ?2. Largest contiguous focus of metastatic carcinoma measures 0.3 cm. ?2. PROGNOSTIC INDICATORS ?Results: ?The tumor cells are EQUIVOCAL for Her2 (2+). Her2 by FISH will be performed and results reported separately. ?Estrogen Receptor: 90%, POSITIVE, STRONG STAINING INTENSITY ?Progesterone Receptor: 95%, POSITIVE, STRONG STAINING INTENSITY ?Proliferation Marker Ki67: 1% ?  ?11/10/2020 Initial Diagnosis  ? Malignant neoplasm of upper-outer quadrant of left breast in female, estrogen receptor positive ([place]) ?  ?11/19/2020 Imaging  ? EXAM: ?CT CHEST, ABDOMEN, AND PELVIS WITH CONTRAST ? ?IMPRESSION: ?1. Left breast mass  as previously described. ?2. Enhancing subcentimeter left axillary lymph nodes. ?3. No evidence of distal metastatic disease. ?4. Small hiatal hernia ?  ?11/19/2020 Imaging  ? EXAM: ?NUCLEAR MEDICINE WHOLE BODY BONE SCAN ? ?IMPRESSION: ?No evidence of metastatic disease. ?  ?11/21/2020 Imaging  ? EXAM: ?BILATERAL BREAST  MRI WITH AND WITHOUT CONTRAST ? ?IMPRESSION: ?1. Suspicious 1.3 centimeter mass in the anterior UPPER central ?RIGHT breast warranting tissue diagnosis. (Image 65 of series 7). ?2. 4 millimeter possible satellite nodule posterior to the 1.3 ?centimeter mass in the RIGHT breast. (Image 63 of series 7). ?3. Indeterminate oval mass in the UPPER central middle depth of the RIGHT breast warranting tissue diagnosis. (Image 60 of series 7). ?4. Large area of enhancement in the central portion of the LEFT ?breast, involving all quadrants. There is associated enhancement and retraction of the LEFT nipple, with significantly smaller size of ?the LEFT breast. Enhancement spans at least 6.5 centimeters. ?5. Enlarged, previously biopsied LEFT axillary lymph node. ?  ?11/24/2020 Genetic Testing  ? Negative hereditary cancer genetic testing: no pathogenic variants detected in [employer] BRCAPlus STAT Panel or [employer] CustomNext-Cancer +RNAinsight Panel.  The report dates are November 24, 2020 and November 27, 2020, respectively.  ? ?The BRCAplus panel offered by [employer] and includes sequencing and deletion/duplication analysis for the following 8 genes: ATM, BRCA1, BRCA2, CDH1, CHEK2, PALB2, PTEN, and TP53.  The CustomNext-Cancer+RNAinsight panel offered by [employer] includes sequencing and rearrangement analysis for the following 47 genes:  APC, ATM, AXIN2, BARD1, BMPR1A, BRCA1, BRCA2, BRIP1, CDH1, CDK4, CDKN2A, CHEK2, DICER1, EPCAM, GREM1, HOXB13, MEN1, MLH1, MSH2, MSH3, MSH6, MUTYH, NBN, NF1, NF2, NTHL1, PALB2, PMS2, POLD1, POLE, PTEN, RAD51C, RAD51D, RECQL, RET, SDHA, SDHAF2, SDHB, SDHC, SDHD, SMAD4, SMARCA4, STK11, TP53, TSC1, TSC2, and VHL.  RNA data is routinely analyzed for use in variant interpretation for all genes. ? ?  ?11/27/2020 Pathology Results  ? Diagnosis ?Breast, right, needle core  biopsy, anterior upper central ?- FIBROADENOMATOID AND FIBROCYSTIC CHANGES WITH CALCIFICATIONS ?- PSEUDOANGIOMATOUS STROMAL  HYPERPLASIA ?- FOCAL PERIDUCTULAR CHRONIC INFLAMMATION ?- NO MALIGNANCY IDENTIFIED ?  ?01/06/2021 Cancer Staging  ? Staging form: Breast, AJCC 8th Edition ?- Pathologic stage from 01/06/2021: Stage IB (pT3,

## 2021-05-13 NOTE — Patient Instructions (Signed)
[facility]  Discharge Instructions: Thank you for choosing [facility] to provide your oncology and hematology care.   If you have a lab appointment with the Cancer Center, please go directly to the Cancer Center and check in at the registration area.   Wear comfortable clothing and clothing appropriate for easy access to any Portacath or PICC line.   We strive to give you quality time with your provider. You may need to reschedule your appointment if you arrive late (15 or more minutes).  Arriving late affects you and other patients whose appointments are after yours.  Also, if you miss three or more appointments without notifying the office, you may be dismissed from the clinic at the provider's discretion.      For prescription refill requests, have your pharmacy contact our office and allow 72 hours for refills to be completed.    Today you received the following chemotherapy and/or immunotherapy agents: Abraxane.      To help prevent nausea and vomiting after your treatment, we encourage you to take your nausea medication as directed.  BELOW ARE SYMPTOMS THAT SHOULD BE REPORTED IMMEDIATELY: *FEVER GREATER THAN 100.4 F (38 C) OR HIGHER *CHILLS OR SWEATING *NAUSEA AND VOMITING THAT IS NOT CONTROLLED WITH YOUR NAUSEA MEDICATION *UNUSUAL SHORTNESS OF BREATH *UNUSUAL BRUISING OR BLEEDING *URINARY PROBLEMS (pain or burning when urinating, or frequent urination) *BOWEL PROBLEMS (unusual diarrhea, constipation, pain near the anus) TENDERNESS IN MOUTH AND THROAT WITH OR WITHOUT PRESENCE OF ULCERS (sore throat, sores in mouth, or a toothache) UNUSUAL RASH, SWELLING OR PAIN  UNUSUAL VAGINAL DISCHARGE OR ITCHING   Items with * indicate a potential emergency and should be followed up as soon as possible or go to the Emergency Department if any problems should occur.  Please show the CHEMOTHERAPY ALERT CARD or IMMUNOTHERAPY ALERT CARD at check-in to  the Emergency Department and triage nurse.  Should you have questions after your visit or need to cancel or reschedule your appointment, please contact [facility]  Dept: [phone]  and follow the prompts.  Office hours are 8:00 a.m. to 4:30 p.m. Monday - Friday. Please note that voicemails left after 4:00 p.m. may not be returned until the following business day.  We are closed weekends and major holidays. You have access to a nurse at all times for urgent questions. Please call the main number to the clinic Dept: [phone] and follow the prompts.   For any non-urgent questions, you may also contact your provider using MyChart. We now offer e-Visits for anyone 18 and older to request care online for non-urgent symptoms. For details visit [email or web address].   Also download the MyChart app! Go to the app store, search "MyChart", open the app, select [facility], and log in with your MyChart username and password.  Due to Covid, a mask is required upon entering the hospital/clinic. If you do not have a mask, one will be given to you upon arrival. For doctor visits, patients may have 1 support person aged 18 or older with them. For treatment visits, patients cannot have anyone with them due to current Covid guidelines and our immunocompromised population.   

## 2021-05-14 ENCOUNTER — Inpatient Hospital Stay: Payer: BC Managed Care – PPO | Admitting: Licensed Clinical Social Worker

## 2021-05-14 NOTE — Progress Notes (Signed)
[facility] CSW Progress Note ? ?Clinical Social Worker met with patient to provide supportive counseling.  Time spent reflecting on previous session.  Pt identified insecurity as a significant issue.  CSW explored insecurity w/ pt and assisted to identify possible causes from previous life experiences.  Time spent identifying pt's strengths as well as the strengths pt identifies as being most important in general.  Pt expressed feelings of frustration and sadness at not having "found my purpose".  These feelings examined, supportive counseling provided.  Pt tasked with setting a small goal prior to next session as a step toward accomplishing small goals to build confidence and explore new things.  CSW to continue to provide supportive counseling as appropriate.   ? ? ? ?[place] [employer], LCSW ?

## 2021-05-21 ENCOUNTER — Other Ambulatory Visit: Payer: Self-pay

## 2021-05-21 ENCOUNTER — Inpatient Hospital Stay: Payer: BC Managed Care – PPO

## 2021-05-21 ENCOUNTER — Encounter: Payer: Self-pay | Admitting: Hematology

## 2021-05-21 ENCOUNTER — Inpatient Hospital Stay (HOSPITAL_BASED_OUTPATIENT_CLINIC_OR_DEPARTMENT_OTHER): Payer: BC Managed Care – PPO | Admitting: Hematology

## 2021-05-21 VITALS — BP 123/65 | HR 93 | Temp 98.3°F | Resp 18 | Ht 64.0 in | Wt 139.8 lb

## 2021-05-21 DIAGNOSIS — Z170 Estrogen receptor positive status [ER+]: Secondary | ICD-10-CM | POA: Diagnosis not present

## 2021-05-21 DIAGNOSIS — Z5111 Encounter for antineoplastic chemotherapy: Secondary | ICD-10-CM | POA: Diagnosis not present

## 2021-05-21 DIAGNOSIS — C50412 Malignant neoplasm of upper-outer quadrant of left female breast: Secondary | ICD-10-CM

## 2021-05-21 DIAGNOSIS — Z95828 Presence of other vascular implants and grafts: Secondary | ICD-10-CM

## 2021-05-21 LAB — CBC WITH DIFFERENTIAL (CANCER CENTER ONLY)
Abs Immature Granulocytes: 0 10*3/uL (ref 0.00–0.07)
Basophils Absolute: 0 10*3/uL (ref 0.0–0.1)
Basophils Relative: 1 %
Eosinophils Absolute: 0 10*3/uL (ref 0.0–0.5)
Eosinophils Relative: 1 %
HCT: 34.6 % — ABNORMAL LOW (ref 36.0–46.0)
Hemoglobin: 11.8 g/dL — ABNORMAL LOW (ref 12.0–15.0)
Immature Granulocytes: 0 %
Lymphocytes Relative: 32 %
Lymphs Abs: 0.7 10*3/uL (ref 0.7–4.0)
MCH: 31 pg (ref 26.0–34.0)
MCHC: 34.1 g/dL (ref 30.0–36.0)
MCV: 90.8 fL (ref 80.0–100.0)
Monocytes Absolute: 0.2 10*3/uL (ref 0.1–1.0)
Monocytes Relative: 9 %
Neutro Abs: 1.2 10*3/uL — ABNORMAL LOW (ref 1.7–7.7)
Neutrophils Relative %: 57 %
Platelet Count: 224 10*3/uL (ref 150–400)
RBC: 3.81 MIL/uL — ABNORMAL LOW (ref 3.87–5.11)
RDW: 13.1 % (ref 11.5–15.5)
WBC Count: 2.1 10*3/uL — ABNORMAL LOW (ref 4.0–10.5)
nRBC: 0 % (ref 0.0–0.2)

## 2021-05-21 LAB — CMP (CANCER CENTER ONLY)
ALT: 26 U/L (ref 0–44)
AST: 19 U/L (ref 15–41)
Albumin: 4.4 g/dL (ref 3.5–5.0)
Alkaline Phosphatase: 34 U/L — ABNORMAL LOW (ref 38–126)
Anion gap: 5 (ref 5–15)
BUN: 14 mg/dL (ref 6–20)
CO2: 27 mmol/L (ref 22–32)
Calcium: 9.5 mg/dL (ref 8.9–10.3)
Chloride: 105 mmol/L (ref 98–111)
Creatinine: 0.66 mg/dL (ref 0.44–1.00)
GFR, Estimated: >60 mL/min (ref >60)
Glucose, Bld: 160 mg/dL — ABNORMAL HIGH (ref 70–99)
Potassium: 4.2 mmol/L (ref 3.5–5.1)
Sodium: 137 mmol/L (ref 135–145)
Total Bilirubin: 0.4 mg/dL (ref 0.3–1.2)
Total Protein: 7 g/dL (ref 6.5–8.1)

## 2021-05-21 MED ORDER — SODIUM CHLORIDE 0.9% FLUSH
10.0000 mL | INTRAVENOUS | Status: DC | PRN
  Administered 2021-05-21: 10 mL

## 2021-05-21 MED ORDER — HEPARIN SOD (PORK) LOCK FLUSH 100 UNIT/ML IV SOLN
500.0000 [IU] | Freq: Once | INTRAVENOUS | Status: AC | PRN
  Administered 2021-05-21: 500 [IU]

## 2021-05-21 MED ORDER — PACLITAXEL PROTEIN-BOUND CHEMO INJECTION 100 MG
80.0000 mg/m2 | Freq: Once | INTRAVENOUS | Status: AC
  Administered 2021-05-21: 125 mg via INTRAVENOUS
  Filled 2021-05-21: qty 25

## 2021-05-21 MED ORDER — SODIUM CHLORIDE 0.9% FLUSH
10.0000 mL | Freq: Once | INTRAVENOUS | Status: AC
  Administered 2021-05-21: 10 mL

## 2021-05-21 MED ORDER — SODIUM CHLORIDE 0.9 % IV SOLN: Freq: Once | INTRAVENOUS | Status: AC

## 2021-05-21 MED ORDER — ONDANSETRON HCL 8 MG PO TABS
8.0000 mg | ORAL_TABLET | Freq: Once | ORAL | Status: AC
  Administered 2021-05-21: 8 mg via ORAL
  Filled 2021-05-21: qty 1

## 2021-05-21 NOTE — Progress Notes (Signed)
?[place]   ?Telephone:([phone] [phone]   ?Clinic Follow up Note  ? ?Patient Care Team: ?Maximiano Coss, NP as PCP - General (Adult Health Nurse Practitioner) ?Mauro Kaufmann, RN as Oncology Nurse Navigator ?Rockwell Germany, RN as Oncology Nurse Navigator ?Stark Klein, MD as Consulting Physician (General Surgery) ?[name], MD as Consulting Physician (Hematology) ?Kyung Rudd, MD as Consulting Physician (Radiation Oncology) ?Estill Dooms, NP as Nurse Practitioner (Obstetrics and Gynecology) ? ?Date of Service:  05/21/2021 ? ?CHIEF COMPLAINT: f/u of left breast cancer ? ?CURRENT THERAPY:  ?Abraxane, weekly, starting 04/08/21 ? ?ASSESSMENT & PLAN:  ?Pamela Reyes is a 41 y.o. pre-menopausal female with  ? ?1. 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 The mass has grown significantly since 2017. Biopsy on 11/05/20 confirmed invasive ductal carcinoma, with metastatic involvement of one lymph node. ?-staging CT CAP and bone scan on 11/19/20 showed no distant metastasis ?-she underwent left mastectomy on 01/06/21 under Dr. Barry Dienes. Pathology showed 6.5 cm invasive ductal carcinoma, grade 2, involving dermis of nipple without epidermal involvement. Margins were negative, but all 4 lymph nodes showed metastatic disease. ?-Due to the large tumor and 4 positive lymph nodes, she received 4 cycles AC-T 02/11/21 - 03/25/21. ?-she started Abraxane on 04/08/21. She tolerated well overall, no complains  ?-we discussed antiestrogen therapy after she finishes abraxane. The recommendation is for Zoladex (until BSO) with AI and adjuvant Verzenio for one year. I briefly reviewed these with her today. ?-pt is not sure if she still needs ALND, I discussed that's standard, but will reach out to Dr. Barry Dienes and Lisbeth Renshaw to get  consensus  ?-she will proceed with radiation after chemo  ?-labs reviewed, overall stable and adequate to proceed with week 7 Abraxane today at same dose.  I will see her every other treatment. ?  ?2. Left side peripheral Neuropathy, G1 ?-present prior to breast cancer diagnosis. ?-possibly alcohol-related from prior use, discontinued alcohol with cancer diagnosis. ?-present intermittently and predominantly in her left hand and foot, none on right side. ?-she has started B complex, serum B12 and methylmalonic acid level were normal. ?-because she has breast tissue expanders currently in place, she cannot have MRI  ?-she is scheduled to meet Dr. Krista Blue on 06/29/21. ?  ?3. Genetics ?-she has a strong family history of breast cancer. She reports her sister, who was diagnosed at age 78 and has since recovered, had negative genetic testing. ?-genetic testing performed 11/11/20, results were negative. ?  ?4. Anxiety and stress ?-she has been connected with our social worker ?  ?5. Health maintenance ?-She had pap/pelvic 02/10/21 ?-she takes vitamin C, D, B complex, and pre/probiotic for GI health. Ok to continue  ?  ?  ?PLAN: ?-proceed with Abraxane today and weekly, at same reduced dose due to leukopenia ?-f/u in 2 weeks ? ? ?No problem-specific Assessment & Plan notes found for this encounter. ? ? ?SUMMARY OF ONCOLOGIC HISTORY: ?Oncology History Overview Note  ? Cancer Staging  ?Malignant neoplasm of upper-outer quadrant of left breast in female, estrogen receptor positive ([place]) ?Staging form: Breast, AJCC 8th Edition ?- Clinical stage from 11/05/2020: Stage IIA (cT2, cN1, cM0, G1, ER+, PR+, HER2: Equivocal) - Signed by [name], MD on 11/10/2020 ?- Pathologic stage from 01/06/2021: Stage IB (pT3, pN2a, cM0, G2, ER+, PR+, HER2-) - Signed by  [name], MD on 01/12/2021 ? ? ?  ?Malignant neoplasm of upper-outer quadrant of left breast in female, estrogen receptor positive ([place])  ?10/28/2020 Mammogram  ? Bilateral Diagnostic  Mammogram; Left Breast Ultrasound ? ?IMPRESSION: ?1. Suspicious palpable mass/hypoechoic area in the left breast at 12 o'clock retroareolar measuring 4.5 cm. ?  ?2.  Suspicious lymph node in the left axilla. ?  ?11/05/2020 Cancer Staging  ? Staging form: Breast, AJCC 8th Edition ?- Clinical stage from 11/05/2020: Stage IIA (cT2, cN1, cM0, G1, ER+, PR+, HER2: Equivocal) - Signed by [name], MD on 11/10/2020 ?Stage prefix: Initial diagnosis ?Histologic grading system: 3 grade system ? ?  ?11/05/2020 Pathology Results  ? Diagnosis ?1. Breast, left, needle core biopsy, 12 o'clock subareolar, ribbon clip ?- INVASIVE MAMMARY CARCINOMA. SEE NOTE ?1. Carcinoma measures 1.5 cm in greatest linear dimension and appears grade 2. ?1. Immunohistochemical stain for E-cadherin is positive in the tumor cells, consistent with a ductal phenotype. ?1. PROGNOSTIC INDICATORS ?Results: ?The tumor cells are EQUIVOCAL for Her2 (2+). Her2 by FISH will be performed and results reported separately. ?Estrogen Receptor: 70%, POSITIVE, STRONG-MODERATE STAINING INTENSITY ?Progesterone Receptor: 80%, POSITIVE, MODERATE STAINING INTENSITY ?Proliferation Marker Ki67: 1% ? ?2. Lymph node, needle/core biopsy, left axilla, tribell clip ?- METASTATIC CARCINOMA TO A LYMPH NODE. SEE NOTE ?2. Largest contiguous focus of metastatic carcinoma measures 0.3 cm. ?2. PROGNOSTIC INDICATORS ?Results: ?The tumor cells are EQUIVOCAL for Her2 (2+). Her2 by FISH will be performed and results reported separately. ?Estrogen Receptor: 90%, POSITIVE, STRONG STAINING INTENSITY ?Progesterone Receptor: 95%, POSITIVE, STRONG STAINING INTENSITY ?Proliferation Marker Ki67: 1% ?  ?11/10/2020 Initial Diagnosis  ? Malignant neoplasm of upper-outer quadrant of left breast in female, estrogen receptor positive ([place]) ? ?  ?11/19/2020 Imaging  ? EXAM: ?CT CHEST, ABDOMEN, AND PELVIS WITH CONTRAST ? ?IMPRESSION: ?1. Left breast mass  as previously described. ?2. Enhancing subcentimeter left  axillary lymph nodes. ?3. No evidence of distal metastatic disease. ?4. Small hiatal hernia ?  ?11/19/2020 Imaging  ? EXAM: ?NUCLEAR MEDICINE WHOLE BODY BONE SCAN ? ?IMPRESSION: ?No evidence of metastatic disease. ?  ?11/21/2020 Imaging  ? EXAM: ?BILATERAL BREAST MRI WITH AND WITHOUT CONTRAST ? ?IMPRESSION: ?1. Suspicious 1.3 centimeter mass in the anterior UPPER central ?RIGHT breast warranting tissue diagnosis. (Image 65 of series 7). ?2. 4 millimeter possible satellite nodule posterior to the 1.3 ?centimeter mass in the RIGHT breast. (Image 63 of series 7). ?3. Indeterminate oval mass in the UPPER central middle depth of the RIGHT breast warranting tissue diagnosis. (Image 60 of series 7). ?4. Large area of enhancement in the central portion of the LEFT ?breast, involving all quadrants. There is associated enhancement and retraction of the LEFT nipple, with significantly smaller size of ?the LEFT breast. Enhancement spans at least 6.5 centimeters. ?5. Enlarged, previously biopsied LEFT axillary lymph node. ?  ?11/24/2020 Genetic Testing  ? Negative hereditary cancer genetic testing: no pathogenic variants detected in [facility] BRCAPlus STAT Panel or [employer] CustomNext-Cancer +RNAinsight Panel.  The report dates are November 24, 2020 and November 27, 2020, respectively.  ? ?The BRCAplus panel offered by [employer] and includes sequencing and deletion/duplication analysis for the following 8 genes: ATM, BRCA1, BRCA2, CDH1, CHEK2, PALB2, PTEN, and TP53.  The CustomNext-Cancer+RNAinsight panel offered by [employer] includes sequencing and rearrangement analysis for the following 47 genes:  APC, ATM, AXIN2, BARD1, BMPR1A, BRCA1, BRCA2, BRIP1, CDH1, CDK4, CDKN2A, CHEK2, DICER1, EPCAM, GREM1, HOXB13, MEN1, MLH1, MSH2, MSH3, MSH6, MUTYH, NBN, NF1, NF2, NTHL1, PALB2,  PMS2, POLD1, POLE, PTEN, RAD51C, RAD51D, RECQL, RET, SDHA, SDHAF2, SDHB, SDHC, SDHD, SMAD4, SMARCA4, STK11, TP53, TSC1, TSC2, and VHL.  RNA data is routinely  analyzed for use in variant interpretation for all genes. ? ?  ?11/27/2020 Pathology Results  ? Diagnosis ?Breast, right, needle core biopsy, anterior upper central ?- FIBROADENOMATOID AND FIBROCYSTIC CHAN

## 2021-05-21 NOTE — Progress Notes (Signed)
Proceed with treatment w ANC 1.2 per MD. ? ?Acquanetta Belling, Amanda, BCPS, BCOP ?05/21/2021 ?11:17 AM ? ?

## 2021-05-21 NOTE — Patient Instructions (Signed)
[facility]  Discharge Instructions: Thank you for choosing [facility] to provide your oncology and hematology care.   If you have a lab appointment with the Cancer Center, please go directly to the Cancer Center and check in at the registration area.   Wear comfortable clothing and clothing appropriate for easy access to any Portacath or PICC line.   We strive to give you quality time with your provider. You may need to reschedule your appointment if you arrive late (15 or more minutes).  Arriving late affects you and other patients whose appointments are after yours.  Also, if you miss three or more appointments without notifying the office, you may be dismissed from the clinic at the provider's discretion.      For prescription refill requests, have your pharmacy contact our office and allow 72 hours for refills to be completed.    Today you received the following chemotherapy and/or immunotherapy agents: Abraxane.      To help prevent nausea and vomiting after your treatment, we encourage you to take your nausea medication as directed.  BELOW ARE SYMPTOMS THAT SHOULD BE REPORTED IMMEDIATELY: *FEVER GREATER THAN 100.4 F (38 C) OR HIGHER *CHILLS OR SWEATING *NAUSEA AND VOMITING THAT IS NOT CONTROLLED WITH YOUR NAUSEA MEDICATION *UNUSUAL SHORTNESS OF BREATH *UNUSUAL BRUISING OR BLEEDING *URINARY PROBLEMS (pain or burning when urinating, or frequent urination) *BOWEL PROBLEMS (unusual diarrhea, constipation, pain near the anus) TENDERNESS IN MOUTH AND THROAT WITH OR WITHOUT PRESENCE OF ULCERS (sore throat, sores in mouth, or a toothache) UNUSUAL RASH, SWELLING OR PAIN  UNUSUAL VAGINAL DISCHARGE OR ITCHING   Items with * indicate a potential emergency and should be followed up as soon as possible or go to the Emergency Department if any problems should occur.  Please show the CHEMOTHERAPY ALERT CARD or IMMUNOTHERAPY ALERT CARD at check-in to  the Emergency Department and triage nurse.  Should you have questions after your visit or need to cancel or reschedule your appointment, please contact [facility]  Dept: [phone]  and follow the prompts.  Office hours are 8:00 a.m. to 4:30 p.m. Monday - Friday. Please note that voicemails left after 4:00 p.m. may not be returned until the following business day.  We are closed weekends and major holidays. You have access to a nurse at all times for urgent questions. Please call the main number to the clinic Dept: [phone] and follow the prompts.   For any non-urgent questions, you may also contact your provider using MyChart. We now offer e-Visits for anyone 18 and older to request care online for non-urgent symptoms. For details visit [email or web address].   Also download the MyChart app! Go to the app store, search "MyChart", open the app, select [facility], and log in with your MyChart username and password.  Due to Covid, a mask is required upon entering the hospital/clinic. If you do not have a mask, one will be given to you upon arrival. For doctor visits, patients may have 1 support person aged 18 or older with them. For treatment visits, patients cannot have anyone with them due to current Covid guidelines and our immunocompromised population.   

## 2021-05-27 ENCOUNTER — Inpatient Hospital Stay: Payer: BC Managed Care – PPO

## 2021-05-27 ENCOUNTER — Other Ambulatory Visit: Payer: Self-pay

## 2021-05-27 VITALS — BP 109/69 | HR 90 | Temp 98.0°F | Resp 18 | Wt 140.0 lb

## 2021-05-27 DIAGNOSIS — Z170 Estrogen receptor positive status [ER+]: Secondary | ICD-10-CM

## 2021-05-27 DIAGNOSIS — Z95828 Presence of other vascular implants and grafts: Secondary | ICD-10-CM

## 2021-05-27 DIAGNOSIS — Z5111 Encounter for antineoplastic chemotherapy: Secondary | ICD-10-CM | POA: Diagnosis not present

## 2021-05-27 LAB — CMP (CANCER CENTER ONLY)
ALT: 20 U/L (ref 0–44)
AST: 18 U/L (ref 15–41)
Albumin: 4.3 g/dL (ref 3.5–5.0)
Alkaline Phosphatase: 31 U/L — ABNORMAL LOW (ref 38–126)
Anion gap: 6 (ref 5–15)
BUN: 13 mg/dL (ref 6–20)
CO2: 29 mmol/L (ref 22–32)
Calcium: 9.6 mg/dL (ref 8.9–10.3)
Chloride: 105 mmol/L (ref 98–111)
Creatinine: 0.65 mg/dL (ref 0.44–1.00)
GFR, Estimated: >60 mL/min (ref >60)
Glucose, Bld: 93 mg/dL (ref 70–99)
Potassium: 3.8 mmol/L (ref 3.5–5.1)
Sodium: 140 mmol/L (ref 135–145)
Total Bilirubin: 0.4 mg/dL (ref 0.3–1.2)
Total Protein: 6.9 g/dL (ref 6.5–8.1)

## 2021-05-27 LAB — CBC WITH DIFFERENTIAL (CANCER CENTER ONLY)
Abs Immature Granulocytes: 0.01 10*3/uL (ref 0.00–0.07)
Basophils Absolute: 0 10*3/uL (ref 0.0–0.1)
Basophils Relative: 1 %
Eosinophils Absolute: 0 10*3/uL (ref 0.0–0.5)
Eosinophils Relative: 1 %
HCT: 32.9 % — ABNORMAL LOW (ref 36.0–46.0)
Hemoglobin: 11.8 g/dL — ABNORMAL LOW (ref 12.0–15.0)
Immature Granulocytes: 0 %
Lymphocytes Relative: 34 %
Lymphs Abs: 1 10*3/uL (ref 0.7–4.0)
MCH: 32.2 pg (ref 26.0–34.0)
MCHC: 35.9 g/dL (ref 30.0–36.0)
MCV: 89.9 fL (ref 80.0–100.0)
Monocytes Absolute: 0.3 10*3/uL (ref 0.1–1.0)
Monocytes Relative: 8 %
Neutro Abs: 1.7 10*3/uL (ref 1.7–7.7)
Neutrophils Relative %: 56 %
Platelet Count: 227 10*3/uL (ref 150–400)
RBC: 3.66 MIL/uL — ABNORMAL LOW (ref 3.87–5.11)
RDW: 12.7 % (ref 11.5–15.5)
WBC Count: 3 10*3/uL — ABNORMAL LOW (ref 4.0–10.5)
nRBC: 0 % (ref 0.0–0.2)

## 2021-05-27 MED ORDER — ONDANSETRON HCL 8 MG PO TABS
8.0000 mg | ORAL_TABLET | Freq: Once | ORAL | Status: AC
  Administered 2021-05-27: 8 mg via ORAL
  Filled 2021-05-27: qty 1

## 2021-05-27 MED ORDER — SODIUM CHLORIDE 0.9% FLUSH
10.0000 mL | INTRAVENOUS | Status: DC | PRN
  Administered 2021-05-27: 10 mL

## 2021-05-27 MED ORDER — HEPARIN SOD (PORK) LOCK FLUSH 100 UNIT/ML IV SOLN
500.0000 [IU] | Freq: Once | INTRAVENOUS | Status: AC | PRN
  Administered 2021-05-27: 500 [IU]

## 2021-05-27 MED ORDER — SODIUM CHLORIDE 0.9 % IV SOLN: Freq: Once | INTRAVENOUS | Status: AC

## 2021-05-27 MED ORDER — PACLITAXEL PROTEIN-BOUND CHEMO INJECTION 100 MG
80.0000 mg/m2 | Freq: Once | INTRAVENOUS | Status: AC
  Administered 2021-05-27: 125 mg via INTRAVENOUS
  Filled 2021-05-27: qty 25

## 2021-05-27 MED ORDER — SODIUM CHLORIDE 0.9% FLUSH
10.0000 mL | Freq: Once | INTRAVENOUS | Status: AC
  Administered 2021-05-27: 10 mL

## 2021-05-27 NOTE — Patient Instructions (Signed)
[facility]  Discharge Instructions: ?Thank you for choosing [facility] to provide your oncology and hematology care.  ? ?If you have a lab appointment with the [facility], please go directly to the [facility] and check in at the registration area. ?  ?Wear comfortable clothing and clothing appropriate for easy access to any Portacath or PICC line.  ? ?We strive to give you quality time with your provider. You may need to reschedule your appointment if you arrive late (15 or more minutes).  Arriving late affects you and other patients whose appointments are after yours.  Also, if you miss three or more appointments without notifying the office, you may be dismissed from the clinic at the provider?s discretion.    ?  ?For prescription refill requests, have your pharmacy contact our office and allow 72 hours for refills to be completed.   ? ?Today you received the following chemotherapy and/or immunotherapy agent: Abraxane    ?  ?To help prevent nausea and vomiting after your treatment, we encourage you to take your nausea medication as directed. ? ?BELOW ARE SYMPTOMS THAT SHOULD BE REPORTED IMMEDIATELY: ?*FEVER GREATER THAN 100.4 F (38 ?C) OR HIGHER ?*CHILLS OR SWEATING ?*NAUSEA AND VOMITING THAT IS NOT CONTROLLED WITH YOUR NAUSEA MEDICATION ?*UNUSUAL SHORTNESS OF BREATH ?*UNUSUAL BRUISING OR BLEEDING ?*URINARY PROBLEMS (pain or burning when urinating, or frequent urination) ?*BOWEL PROBLEMS (unusual diarrhea, constipation, pain near the anus) ?TENDERNESS IN MOUTH AND THROAT WITH OR WITHOUT PRESENCE OF ULCERS (sore throat, sores in mouth, or a toothache) ?UNUSUAL RASH, SWELLING OR PAIN  ?UNUSUAL VAGINAL DISCHARGE OR ITCHING  ? ?Items with * indicate a potential emergency and should be followed up as soon as possible or go to the Emergency Department if any problems should occur. ? ?Please show the CHEMOTHERAPY ALERT CARD or IMMUNOTHERAPY ALERT CARD at check-in to  the Emergency Department and triage nurse. ? ?Should you have questions after your visit or need to cancel or reschedule your appointment, please contact [facility]  Dept: [phone]  and follow the prompts.  Office hours are 8:00 a.m. to 4:30 p.m. Monday - Friday. Please note that voicemails left after 4:00 p.m. may not be returned until the following business day.  We are closed weekends and major holidays. You have access to a nurse at all times for urgent questions. Please call the main number to the clinic Dept: [phone] and follow the prompts. ? ? ?For any non-urgent questions, you may also contact your provider using MyChart. We now offer e-Visits for anyone 58 and older to request care online for non-urgent symptoms. For details visit [email or web address]. ?  ?Also download the MyChart app! Go to the app store, search "MyChart", open the app, select [facility], and log in with your MyChart username and password. ? ?Due to Covid, a mask is required upon entering the hospital/clinic. If you do not have a mask, one will be given to you upon arrival. For doctor visits, patients may have 1 support person aged 31 or older with them. For treatment visits, patients cannot have anyone with them due to current Covid guidelines and our immunocompromised population.  ? ?

## 2021-05-28 ENCOUNTER — Inpatient Hospital Stay: Payer: BC Managed Care – PPO | Admitting: Licensed Clinical Social Worker

## 2021-05-28 DIAGNOSIS — C50412 Malignant neoplasm of upper-outer quadrant of left female breast: Secondary | ICD-10-CM

## 2021-05-28 NOTE — Progress Notes (Signed)
[facility] CSW Progress Note ? ?Clinical Social Worker met with patient to provide supportive counseling.  Pt met w/ Dr Burr Medico last week to discuss the next steps of treatment.  Pt informed surgery would be the next step to remove lymph nodes, which was unexpected on behalf of pt.  CSW discussed decision making at length with pt as this is an area pt has verbalized she struggles with.  Emotions connected to decision making explored as well as expectations to make the "right" decision.  Cancer treatment in general as pertains to changing plans discussed at length and connected to how pt's anxiety is affected by uncertainty.  Pt's feelings validated and emotional support provided.  CSW suggested journaling as a means to express feelings and process to assist with decision making, also as a means of being able to see progress through treatment as pt feels at times as though she has made no progress.  CSW to meet w/ pt in three weeks. ? ? ? ?[place] [facility], LCSW ?

## 2021-06-03 ENCOUNTER — Inpatient Hospital Stay: Payer: BC Managed Care – PPO

## 2021-06-03 ENCOUNTER — Other Ambulatory Visit: Payer: Self-pay | Admitting: Hematology

## 2021-06-03 ENCOUNTER — Other Ambulatory Visit: Payer: Self-pay

## 2021-06-03 ENCOUNTER — Inpatient Hospital Stay (HOSPITAL_BASED_OUTPATIENT_CLINIC_OR_DEPARTMENT_OTHER): Payer: BC Managed Care – PPO | Admitting: Hematology

## 2021-06-03 VITALS — BP 113/74 | HR 95 | Temp 98.0°F | Resp 16 | Wt 138.0 lb

## 2021-06-03 DIAGNOSIS — Z170 Estrogen receptor positive status [ER+]: Secondary | ICD-10-CM

## 2021-06-03 DIAGNOSIS — C50412 Malignant neoplasm of upper-outer quadrant of left female breast: Secondary | ICD-10-CM | POA: Diagnosis not present

## 2021-06-03 DIAGNOSIS — Z95828 Presence of other vascular implants and grafts: Secondary | ICD-10-CM

## 2021-06-03 DIAGNOSIS — Z5111 Encounter for antineoplastic chemotherapy: Secondary | ICD-10-CM | POA: Diagnosis not present

## 2021-06-03 LAB — CMP (CANCER CENTER ONLY)
ALT: 22 U/L (ref 0–44)
AST: 19 U/L (ref 15–41)
Albumin: 4.6 g/dL (ref 3.5–5.0)
Alkaline Phosphatase: 29 U/L — ABNORMAL LOW (ref 38–126)
Anion gap: 7 (ref 5–15)
BUN: 15 mg/dL (ref 6–20)
CO2: 28 mmol/L (ref 22–32)
Calcium: 9.8 mg/dL (ref 8.9–10.3)
Chloride: 104 mmol/L (ref 98–111)
Creatinine: 0.59 mg/dL (ref 0.44–1.00)
GFR, Estimated: >60 mL/min (ref >60)
Glucose, Bld: 96 mg/dL (ref 70–99)
Potassium: 4 mmol/L (ref 3.5–5.1)
Sodium: 139 mmol/L (ref 135–145)
Total Bilirubin: 0.4 mg/dL (ref 0.3–1.2)
Total Protein: 7.2 g/dL (ref 6.5–8.1)

## 2021-06-03 LAB — CBC WITH DIFFERENTIAL (CANCER CENTER ONLY)
Abs Immature Granulocytes: 0.01 10*3/uL (ref 0.00–0.07)
Basophils Absolute: 0 10*3/uL (ref 0.0–0.1)
Basophils Relative: 1 %
Eosinophils Absolute: 0 10*3/uL (ref 0.0–0.5)
Eosinophils Relative: 1 %
HCT: 36.3 % (ref 36.0–46.0)
Hemoglobin: 12.7 g/dL (ref 12.0–15.0)
Immature Granulocytes: 1 %
Lymphocytes Relative: 39 %
Lymphs Abs: 0.8 10*3/uL (ref 0.7–4.0)
MCH: 31.5 pg (ref 26.0–34.0)
MCHC: 35 g/dL (ref 30.0–36.0)
MCV: 90.1 fL (ref 80.0–100.0)
Monocytes Absolute: 0.2 10*3/uL (ref 0.1–1.0)
Monocytes Relative: 9 %
Neutro Abs: 1 10*3/uL — ABNORMAL LOW (ref 1.7–7.7)
Neutrophils Relative %: 49 %
Platelet Count: 246 10*3/uL (ref 150–400)
RBC: 4.03 MIL/uL (ref 3.87–5.11)
RDW: 12.8 % (ref 11.5–15.5)
WBC Count: 1.9 10*3/uL — ABNORMAL LOW (ref 4.0–10.5)
nRBC: 0 % (ref 0.0–0.2)

## 2021-06-03 MED ORDER — ONDANSETRON HCL 8 MG PO TABS
8.0000 mg | ORAL_TABLET | Freq: Once | ORAL | Status: AC
  Administered 2021-06-03: 8 mg via ORAL
  Filled 2021-06-03: qty 1

## 2021-06-03 MED ORDER — SODIUM CHLORIDE 0.9 % IV SOLN: Freq: Once | INTRAVENOUS | Status: AC

## 2021-06-03 MED ORDER — SODIUM CHLORIDE 0.9% FLUSH
10.0000 mL | Freq: Once | INTRAVENOUS | Status: AC
  Administered 2021-06-03: 10 mL

## 2021-06-03 MED ORDER — PACLITAXEL PROTEIN-BOUND CHEMO INJECTION 100 MG
80.0000 mg/m2 | Freq: Once | INTRAVENOUS | Status: AC
  Administered 2021-06-03: 125 mg via INTRAVENOUS
  Filled 2021-06-03: qty 25

## 2021-06-03 NOTE — Patient Instructions (Signed)
[facility]  Discharge Instructions: ?Thank you for choosing [facility] to provide your oncology and hematology care.  ? ?If you have a lab appointment with the [facility], please go directly to the [facility] and check in at the registration area. ?  ?Wear comfortable clothing and clothing appropriate for easy access to any Portacath or PICC line.  ? ?We strive to give you quality time with your provider. You may need to reschedule your appointment if you arrive late (15 or more minutes).  Arriving late affects you and other patients whose appointments are after yours.  Also, if you miss three or more appointments without notifying the office, you may be dismissed from the clinic at the provider?s discretion.    ?  ?For prescription refill requests, have your pharmacy contact our office and allow 72 hours for refills to be completed.   ? ?Today you received the following chemotherapy and/or immunotherapy agent: Abraxane    ?  ?To help prevent nausea and vomiting after your treatment, we encourage you to take your nausea medication as directed. ? ?BELOW ARE SYMPTOMS THAT SHOULD BE REPORTED IMMEDIATELY: ?*FEVER GREATER THAN 100.4 F (38 ?C) OR HIGHER ?*CHILLS OR SWEATING ?*NAUSEA AND VOMITING THAT IS NOT CONTROLLED WITH YOUR NAUSEA MEDICATION ?*UNUSUAL SHORTNESS OF BREATH ?*UNUSUAL BRUISING OR BLEEDING ?*URINARY PROBLEMS (pain or burning when urinating, or frequent urination) ?*BOWEL PROBLEMS (unusual diarrhea, constipation, pain near the anus) ?TENDERNESS IN MOUTH AND THROAT WITH OR WITHOUT PRESENCE OF ULCERS (sore throat, sores in mouth, or a toothache) ?UNUSUAL RASH, SWELLING OR PAIN  ?UNUSUAL VAGINAL DISCHARGE OR ITCHING  ? ?Items with * indicate a potential emergency and should be followed up as soon as possible or go to the Emergency Department if any problems should occur. ? ?Please show the CHEMOTHERAPY ALERT CARD or IMMUNOTHERAPY ALERT CARD at check-in to  the Emergency Department and triage nurse. ? ?Should you have questions after your visit or need to cancel or reschedule your appointment, please contact [facility]  Dept: [phone]  and follow the prompts.  Office hours are 8:00 a.m. to 4:30 p.m. Monday - Friday. Please note that voicemails left after 4:00 p.m. may not be returned until the following business day.  We are closed weekends and major holidays. You have access to a nurse at all times for urgent questions. Please call the main number to the clinic Dept: [phone] and follow the prompts. ? ? ?For any non-urgent questions, you may also contact your provider using MyChart. We now offer e-Visits for anyone 3 and older to request care online for non-urgent symptoms. For details visit [email or web address]. ?  ?Also download the MyChart app! Go to the app store, search "MyChart", open the app, select [facility], and log in with your MyChart username and password. ? ?Due to Covid, a mask is required upon entering the hospital/clinic. If you do not have a mask, one will be given to you upon arrival. For doctor visits, patients may have 1 support person aged 4 or older with them. For treatment visits, patients cannot have anyone with them due to current Covid guidelines and our immunocompromised population.  ? ?

## 2021-06-03 NOTE — Progress Notes (Signed)
?[facility]   ?[phone] [phone]   ?Clinic Follow up Note  ? ?Patient Care Team: ?Maximiano Coss, NP as PCP - General (Adult Health Nurse Practitioner) ?Mauro Kaufmann, RN as Oncology Nurse Navigator ?Rockwell Germany, RN as Oncology Nurse Navigator ?Stark Klein, MD as Consulting Physician (General Surgery) ?[name], MD as Consulting Physician (Hematology) ?Kyung Rudd, MD as Consulting Physician (Radiation Oncology) ?Estill Dooms, NP as Nurse Practitioner (Obstetrics and Gynecology) ? ?Date of Service:  06/03/2021 ? ?CHIEF COMPLAINT: f/u of left breast cancer ? ?CURRENT THERAPY:  ?Abraxane, weekly, starting 04/08/21 ? ?ASSESSMENT & PLAN:  ?Pamela Reyes is a 41 y.o. pre-menopausal female with  ? ?1. 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 The mass has grown significantly since 2017. Biopsy on 11/05/20 confirmed invasive ductal carcinoma, with metastatic involvement of one lymph node. ?-staging CT CAP and bone scan on 11/19/20 showed no distant metastasis ?-she underwent left mastectomy on 01/06/21 under Dr. Barry Dienes. Pathology showed 6.5 cm invasive ductal carcinoma, grade 2, involving dermis of nipple without epidermal involvement. Margins were negative, but all 4 lymph nodes showed metastatic disease. ?-Due to the large tumor and 4 positive lymph nodes, she received 4 cycles AC-T 02/11/21 - 03/25/21. ?-she started Abraxane on 04/08/21. She tolerated well overall, no complains  ?-we discussed antiestrogen therapy after she finishes abraxane. The recommendation is for Zoladex (until BSO) with AI and adjuvant Verzenio for one year. ?-we reviewed that ALND is standard. She is scheduled to see Dr. Barry Dienes on 06/23/21. I will also request an appointment with Dr. Lisbeth Renshaw for further discussion, as  she will proceed with radiation after chemo  ?-we also discussed surveillance scans today. I reviewed that we do not usually perform whole-body scans unless the patient has symptoms. But given her concern about surgery, we could proceed with PET or [facility] PET scan to help her make the decision on ALND ?-labs reviewed, CBC improved except for WBC, which is down to 1.9. I will order GCF-S to be done early next week. Otherwise adequate to proceed with week 9 Abraxane today at same dose.  I will see her every other treatment. ?  ?2. Left side peripheral Neuropathy, G1 ?-present prior to breast cancer diagnosis. ?-possibly alcohol-related from prior use, discontinued alcohol with cancer diagnosis. ?-present intermittently and predominantly in her left hand and foot, none on right side. ?-she has started B complex, serum B12 and methylmalonic acid level were normal. ?-because she has breast tissue expanders currently in place, she cannot have MRI  ?-she is scheduled to meet Dr. Krista Blue on 06/29/21. I will try to get this moved up for her. ?  ?3. Genetics ?-she has a strong family history of breast cancer. She reports her sister, who was diagnosed at age 18 and has since recovered, had negative genetic testing. ?-genetic testing performed 11/11/20, results were negative. ?  ?4. Anxiety and stress ?-she has been connected with our social worker ?  ?5. Health maintenance ?-She had pap/pelvic 02/10/21 ?-she takes vitamin C, D, B complex, and pre/probiotic for GI health. Ok to continue  ?  ?  ?PLAN: ?-proceed with Abraxane today and weekly, at same dose ? -will plan for GCF-S early next week ?-I will reach out to Dr. Krista Blue to get her appointment moved up ?-will review with Drs. Barry Dienes and [facility]  about PET scan  ?-f/u in 2 weeks ? ? ?No problem-specific Assessment & Plan notes found for this encounter. ? ? ?SUMMARY OF ONCOLOGIC HISTORY: ?Oncology History Overview Note  ? Cancer Staging  ?Malignant neoplasm of upper-outer quadrant of  left breast in female, estrogen receptor positive ([facility]) ?Staging form: Breast, AJCC 8th Edition ?- Clinical stage from 11/05/2020: Stage IIA (cT2, cN1, cM0, G1, ER+, PR+, HER2: Equivocal) - Signed by [name], MD on 11/10/2020 ?- Pathologic stage from 01/06/2021: Stage IB (pT3, pN2a, cM0, G2, ER+, PR+, HER2-) - Signed by [name], MD on 01/12/2021 ? ? ?  ?Malignant neoplasm of upper-outer quadrant of left breast in female, estrogen receptor positive ([facility])  ?10/28/2020 Mammogram  ? Bilateral Diagnostic Mammogram; Left Breast Ultrasound ? ?IMPRESSION: ?1. Suspicious palpable mass/hypoechoic area in the left breast at 12 o'clock retroareolar measuring 4.5 cm. ?  ?2.  Suspicious lymph node in the left axilla. ?  ?11/05/2020 Cancer Staging  ? Staging form: Breast, AJCC 8th Edition ?- Clinical stage from 11/05/2020: Stage IIA (cT2, cN1, cM0, G1, ER+, PR+, HER2: Equivocal) - Signed by [name], MD on 11/10/2020 ?Stage prefix: Initial diagnosis ?Histologic grading system: 3 grade system ? ?  ?11/05/2020 Pathology Results  ? Diagnosis ?1. Breast, left, needle core biopsy, 12 o'clock subareolar, ribbon clip ?- INVASIVE MAMMARY CARCINOMA. SEE NOTE ?1. Carcinoma measures 1.5 cm in greatest linear dimension and appears grade 2. ?1. Immunohistochemical stain for E-cadherin is positive in the tumor cells, consistent with a ductal phenotype. ?1. PROGNOSTIC INDICATORS ?Results: ?The tumor cells are EQUIVOCAL for Her2 (2+). Her2 by FISH will be performed and results reported separately. ?Estrogen Receptor: 70%, POSITIVE, STRONG-MODERATE STAINING INTENSITY ?Progesterone Receptor: 80%, POSITIVE, MODERATE STAINING INTENSITY ?Proliferation Marker Ki67: 1% ? ?2. Lymph node, needle/core biopsy, left axilla, tribell clip ?- METASTATIC CARCINOMA TO A LYMPH NODE. SEE NOTE ?2. Largest contiguous focus of metastatic carcinoma measures 0.3 cm. ?2. PROGNOSTIC INDICATORS ?Results: ?The tumor cells are EQUIVOCAL for Her2 (2+). Her2 by FISH will be  performed and results reported separately. ?Estrogen Receptor: 90%, POSITIVE, STRONG STAINING INTENSITY ?Progesterone Receptor: 95%, POSITIVE, STRONG STAINING INTENSITY ?Proliferation Marker Ki67: 1% ?  ?11/10/2020 Initial Diagnosis  ? Malignant neoplasm of upper-outer quadrant of left breast in female, estrogen receptor positive ([facility]) ? ?  ?11/19/2020 Imaging  ? EXAM: ?CT CHEST, ABDOMEN, AND PELVIS WITH CONTRAST ? ?IMPRESSION: ?1. Left breast mass  as previously described. ?2. Enhancing subcentimeter left axillary lymph nodes. ?3. No evidence of distal metastatic disease. ?4. Small hiatal hernia ?  ?11/19/2020 Imaging  ? EXAM: ?NUCLEAR MEDICINE WHOLE BODY BONE SCAN ? ?IMPRESSION: ?No evidence of metastatic disease. ?  ?11/21/2020 Imaging  ? EXAM: ?BILATERAL BREAST MRI WITH AND WITHOUT CONTRAST ? ?IMPRESSION: ?1. Suspicious 1.3 centimeter mass in the anterior UPPER central ?RIGHT breast warranting tissue diagnosis. (Image 65 of series 7). ?2. 4 millimeter possible satellite nodule posterior to the 1.3 ?centimeter mass in the RIGHT breast. (Image 63 of series 7). ?3. Indeterminate oval mass in the UPPER central middle depth of the RIGHT breast warranting tissue diagnosis. (Image 60 of series 7). ?4. Large area of enhancement in the central portion of the LEFT ?breast, involving all quadrants. There is associated enhancement and retraction of the LEFT nipple, with significantly smaller size of ?the LEFT breast. Enhancement spans at least 6.5 centimeters. ?5. Enlarged, previously biopsied LEFT axillary lymph node. ?  ?11/24/2020 Genetic Testing  ? Negative hereditary cancer genetic testing: no pathogenic variants detected in [facility] BRCAPlus  STAT Panel or [facility] CustomNext-Cancer +RNAinsight Panel.  The report dates are November 24, 2020 and November 27, 2020, respectively.  ? ?The BRCAplus panel offered by [employer] and includes sequencing and deletion/duplication analysis for the following 8 genes: ATM, BRCA1, BRCA2,  CDH1, CHEK2, PALB2, PTEN, and TP53.  The CustomNext-Cancer+RNAinsight panel offered by [facility] includes sequencing and rearrangement analysis for the following 47 genes:  APC, ATM, AXIN2, BARD1, BMPR1A,

## 2021-06-03 NOTE — Progress Notes (Signed)
Ok to treat per Dr Burr Medico with anc 1.0  ? ?

## 2021-06-04 ENCOUNTER — Telehealth: Payer: Self-pay | Admitting: Radiation Oncology

## 2021-06-04 LAB — CANCER ANTIGEN 27.29: CA 27.29: 21.9 U/mL (ref 0.0–38.6)

## 2021-06-04 NOTE — Telephone Encounter (Signed)
4/28 @ 11:39 am Left voicemail for patient to call our office to be schedule for consult.   ?

## 2021-06-07 ENCOUNTER — Ambulatory Visit: Payer: BC Managed Care – PPO | Admitting: Neurology

## 2021-06-07 ENCOUNTER — Other Ambulatory Visit: Payer: Self-pay

## 2021-06-07 ENCOUNTER — Inpatient Hospital Stay: Payer: BC Managed Care – PPO | Attending: Hematology

## 2021-06-07 VITALS — BP 118/78 | HR 80 | Temp 98.2°F | Resp 17

## 2021-06-07 DIAGNOSIS — Z5189 Encounter for other specified aftercare: Secondary | ICD-10-CM | POA: Diagnosis not present

## 2021-06-07 DIAGNOSIS — Z95828 Presence of other vascular implants and grafts: Secondary | ICD-10-CM

## 2021-06-07 DIAGNOSIS — C50412 Malignant neoplasm of upper-outer quadrant of left female breast: Secondary | ICD-10-CM | POA: Diagnosis present

## 2021-06-07 DIAGNOSIS — Z5111 Encounter for antineoplastic chemotherapy: Secondary | ICD-10-CM | POA: Insufficient documentation

## 2021-06-07 DIAGNOSIS — Z170 Estrogen receptor positive status [ER+]: Secondary | ICD-10-CM | POA: Insufficient documentation

## 2021-06-07 MED ORDER — FILGRASTIM-SNDZ 300 MCG/0.5ML IJ SOSY
300.0000 ug | PREFILLED_SYRINGE | Freq: Once | INTRAMUSCULAR | Status: AC
  Administered 2021-06-07: 300 ug via SUBCUTANEOUS
  Filled 2021-06-07: qty 0.5

## 2021-06-08 ENCOUNTER — Encounter: Payer: Self-pay | Admitting: Neurology

## 2021-06-08 ENCOUNTER — Ambulatory Visit (INDEPENDENT_AMBULATORY_CARE_PROVIDER_SITE_OTHER): Payer: BC Managed Care – PPO | Admitting: Neurology

## 2021-06-08 VITALS — BP 136/74 | HR 94 | Ht 64.0 in | Wt 139.5 lb

## 2021-06-08 DIAGNOSIS — R202 Paresthesia of skin: Secondary | ICD-10-CM | POA: Insufficient documentation

## 2021-06-08 NOTE — Progress Notes (Signed)
? ?Chief Complaint  ?Patient presents with  ? New Patient (Initial Visit)  ?  Rm 13. Alone. ?NP/internal referral for neuropathy.  ? ? ? ? ?ASSESSMENT AND PLAN ? ?Pamela Reyes is a 41 y.o. female   ?Intermittent left hand and foot paresthesia ? Reported left hand paresthesia during the distribution of left ulnar nerve, she does have left elbow Tinel's sign, ? Reported long habit of crossing her leg when sitting down, ? Differentiation diagnosis including focal neuropathy, such as left ulnar nerve compression at left elbow/left common peroneal compression of left fibular head, ? History and examination does not support a peripheral neuropathy, cervical radiculopathy, or lumbar radiculopathy, ? She is not MRI candidate, will hold off imaging study at this point, ? She denies significant neuropathic pain, ? But she does complains of anxiety, she may benefit from medication treatment such as Cymbalta, nortriptyline, ? Will continue to observe her symptoms, return to clinic for new issues ? ? ?DIAGNOSTIC DATA (LABS, IMAGING, TESTING) ?- I reviewed patient records, labs, notes, testing and imaging myself where available. ?Laboratory evaluations in 2023, decreased WBC 1.9, normal hemoglobin of 12.7, normal CMP, creatinine 0.59, normal TSH, B12 ? ?MEDICAL HISTORY: ? ?Pamela Reyes is a 41 year old female, seen in request by her oncologist Dr. [name], for evaluation of intermittent left upper and lower extremity paresthesia, her primary care physician is nurse practitioner ? Maximiano Coss, initial evaluation was on Jun 08, 2021. ? ?I reviewed and summarized the referring note. PMHX. ?Left breast cancer, was diagnosed in Sept 2022. ? ?She was diagnosed with left breast cancer in September 2022,  left mastectomy on January 06, 2021, showed 6.5 cm invasive ductal carcinoma, grade 2, margin was negative, all 41 year old showed metastatic disease, she received her fourth cycle of AC-T February 11, 2021 to February 1623,  started on Abraxane on April 08, 2021, planning on further radiation therapy after chemo ? ?Even prior to her diagnosis of left breast cancer in summer 2022, she began to notice intermittent numbness mainly involving left ulnar 3 fingers, occasionally spreading to left elbow, but denies neck pain, denies left hand weakness, sometimes she also noticed intermittent numbness tingling involving left 3 toes, below ankle level, she denies gait abnormality ? ?Her symptoms improved for few months, only during February 2023, she began to notice recurrent left hand and left foot paresthesia again, in the same distribution, mild needle prick sensation, but denies significant pain, no weakness, in specific, she denies neck pain, low back pain, no cervical lumbar radicular pain, no gait abnormality ? ?She does report anxiety symptoms with her diagnosis and ongoing treatment of breast cancer ? ?She is not a candidate for MRI due to left breast expander  ? ?She work as a Secretary/administrator, sitting down most of the time, tends to cross her leg, rest the elbow on desk surface ? ?PHYSICAL EXAM: ?  ?Vitals:  ? 06/08/21 1542  ?BP: 136/74  ?Pulse: 94  ?Weight: 139 lb 8 oz (63.3 kg)  ?Height: '5\' 4"'$  (1.626 m)  ? ?Not recorded ?  ? ? ?Body mass index is 23.95 kg/m?. ? ?PHYSICAL EXAMNIATION: ? ?Gen: NAD, conversant, well nourised, well groomed                     ?Cardiovascular: Regular rate rhythm, no peripheral edema, warm, nontender. ?Eyes: Conjunctivae clear without exudates or hemorrhage ?Neck: Supple, no carotid bruits. ?Pulmonary: Clear to auscultation bilaterally  ? ?NEUROLOGICAL EXAM: ? ?MENTAL STATUS: ?  Speech/cognition: ?Anxious looking middle-age female ? ?CRANIAL NERVES: ?CN II: Visual fields are full to confrontation. Pupils are round equal and briskly reactive to light. ?CN III, IV, VI: extraocular movement are normal. No ptosis. ?CN V: Facial sensation is intact to light touch ?CN VII: Face is symmetric with normal eye closure   ?CN VIII: Hearing is normal to causal conversation. ?CN IX, X: Phonation is normal. ?CN XI: Head turning and shoulder shrug are intact ? ?MOTOR: ?There is no pronator drift of out-stretched arms. Muscle bulk and tone are normal. Muscle strength is normal.  Left elbow Tinel's sign, but no left fibular head Tinel's sign. ? ?REFLEXES: ?Reflexes are 2+ and symmetric at the biceps, triceps, knees, and ankles. Plantar responses are flexor. ? ?SENSORY: ?Intact to light touch, pinprick and vibratory sensation are intact in fingers and toes. ? ?COORDINATION: ?There is no trunk or limb dysmetria noted. ? ?GAIT/STANCE: ?Posture is normal. Gait is steady with normal steps, base, arm swing, and turning. Heel and toe walking are normal. Tandem gait is normal.  ?Romberg is absent. ? ?REVIEW OF SYSTEMS:  ?Full 14 system review of systems performed and notable only for as above ?All other review of systems were negative. ? ? ?ALLERGIES: ?Allergies  ?Allergen Reactions  ? Prochlorperazine Other (See Comments)  ?  Severe restlessness. Akathisia.  ? ? ?HOME MEDICATIONS: ?Current Outpatient Medications  ?Medication Sig Dispense Refill  ? acetaminophen (TYLENOL) 500 MG tablet Take 500-1,000 mg by mouth every 6 (six) hours as needed for moderate pain or headache.    ? Cholecalciferol (DIALYVITE VITAMIN D 5000) 125 MCG (5000 UT) capsule Take 5,000 Units by mouth daily. Vit D and K supplement combined    ? diphenhydrAMINE (BENADRYL) 25 MG tablet Take 25 mg by mouth at bedtime as needed for sleep.    ? famotidine (PEPCID) 10 MG tablet Take 10 mg by mouth daily as needed for heartburn or indigestion.    ? lidocaine-prilocaine (EMLA) cream Apply to affected area once 30 g 3  ? loratadine (CLARITIN) 10 MG tablet Take 10 mg by mouth daily.    ? tretinoin (RETIN-A) 0.025 % cream Apply 1 application. topically at bedtime.    ? ?No current facility-administered medications for this visit.  ? ? ?PAST MEDICAL HISTORY: ?Past Medical History:   ?Diagnosis Date  ? Anxiety   ? Breast cancer ([place])   ? Family history of breast cancer 11/11/2020  ? Fibroid 10/27/2020  ? Has 1 x .9 x .6 cm subserosal fibroid  ? History of hiatal hernia   ? ? ?PAST SURGICAL HISTORY: ?Past Surgical History:  ?Procedure Laterality Date  ? BREAST RECONSTRUCTION WITH PLACEMENT OF TISSUE EXPANDER AND FLEX HD (ACELLULAR HYDRATED DERMIS) Left 01/06/2021  ? Procedure: LEFT BREAST RECONSTRUCTION WITH PLACEMENT OF TISSUE EXPANDER AND ACELLULAR  DERMIS;  Surgeon: Irene Limbo, MD;  Location: [facility];  Service: Plastics;  Laterality: Left;  ? MASTECTOMY W/ SENTINEL NODE BIOPSY Left 01/06/2021  ? Procedure: LEFT MASTECTOMY WITH MAGTRACE;  Surgeon: Stark Klein, MD;  Location: [facility];  Service: General;  Laterality: Left;  ? PORTACATH PLACEMENT Right 01/19/2021  ? Procedure: INSERTION PORT-A-CATH;  Surgeon: Stark Klein, MD;  Location: [facility];  Service: General;  Laterality: Right;  ? RADIOACTIVE SEED GUIDED AXILLARY SENTINEL LYMPH NODE Left 01/06/2021  ? Procedure: RADIOACTIVE SEED GUIDED SENTINEL LYMPH NODE BIOPSY;  Surgeon: Stark Klein, MD;  Location: [facility];  Service: General;  Laterality: Left;  ? SENTINEL NODE BIOPSY Left 01/06/2021  ?  Procedure: LEFT AXILLARY SENTINEL LYMPH  NODE BIOPSY;  Surgeon: Stark Klein, MD;  Location: [facility];  Service: General;  Laterality: Left;  ? WISDOM TOOTH EXTRACTION    ? ? ?FAMILY HISTORY: ?Family History  ?Problem Relation Age of Onset  ? Congestive Heart Failure Father   ? Alcohol abuse Father   ? Heart disease Father   ? Breast cancer Sister 42  ?     ER+  ? Bladder Cancer Maternal Uncle   ?     smoking hx  ? Cancer Maternal Uncle   ?     kidney or prostate; dx after 50  ? Breast cancer Paternal Aunt   ?     dx after 50; x2 paternal aunts  ? Stroke Maternal Grandmother   ? Lung cancer Maternal Grandfather   ?     dx after 50; smoking hx  ? Breast cancer Paternal Grandmother   ?     dx after 27  ? ? ?SOCIAL HISTORY: ?Social History   ? ?Socioeconomic History  ? Marital status: Soil scientist  ?  Spouse name: Not on file  ? Number of children: 0  ? Years of education: Not on file  ? Highest education level: Not on file  ?Occupational History  ? Not on

## 2021-06-09 ENCOUNTER — Telehealth: Payer: Self-pay

## 2021-06-09 NOTE — Telephone Encounter (Signed)
Left voicemail reminding patient of their 8:00am-06/10/21 in-person consult appointment w/ Shona Simpson PA-C. I advised patient to arrive 43mn early for check-in. I left my extension [phone] case patient needs anything. ?

## 2021-06-10 ENCOUNTER — Other Ambulatory Visit: Payer: Self-pay

## 2021-06-10 ENCOUNTER — Encounter: Payer: Self-pay | Admitting: Radiation Oncology

## 2021-06-10 ENCOUNTER — Inpatient Hospital Stay: Payer: BC Managed Care – PPO

## 2021-06-10 ENCOUNTER — Ambulatory Visit
Admission: RE | Admit: 2021-06-10 | Discharge: 2021-06-10 | Disposition: A | Discharge status: Home / Self Care | Payer: BC Managed Care – PPO | Source: Ambulatory Visit | Attending: Radiation Oncology | Admitting: Radiation Oncology

## 2021-06-10 VITALS — BP 111/89 | HR 98 | Temp 96.9°F | Resp 18 | Ht 64.0 in | Wt 137.5 lb

## 2021-06-10 VITALS — BP 117/79 | HR 99 | Temp 98.1°F | Resp 16 | Ht 64.0 in | Wt 138.0 lb

## 2021-06-10 DIAGNOSIS — Z803 Family history of malignant neoplasm of breast: Secondary | ICD-10-CM | POA: Insufficient documentation

## 2021-06-10 DIAGNOSIS — Z9012 Acquired absence of left breast and nipple: Secondary | ICD-10-CM | POA: Diagnosis not present

## 2021-06-10 DIAGNOSIS — Z801 Family history of malignant neoplasm of trachea, bronchus and lung: Secondary | ICD-10-CM | POA: Insufficient documentation

## 2021-06-10 DIAGNOSIS — Z5111 Encounter for antineoplastic chemotherapy: Secondary | ICD-10-CM | POA: Diagnosis not present

## 2021-06-10 DIAGNOSIS — Z170 Estrogen receptor positive status [ER+]: Secondary | ICD-10-CM

## 2021-06-10 DIAGNOSIS — Z87891 Personal history of nicotine dependence: Secondary | ICD-10-CM | POA: Diagnosis not present

## 2021-06-10 DIAGNOSIS — K449 Diaphragmatic hernia without obstruction or gangrene: Secondary | ICD-10-CM | POA: Diagnosis not present

## 2021-06-10 DIAGNOSIS — C50412 Malignant neoplasm of upper-outer quadrant of left female breast: Secondary | ICD-10-CM | POA: Insufficient documentation

## 2021-06-10 DIAGNOSIS — Z853 Personal history of malignant neoplasm of breast: Secondary | ICD-10-CM | POA: Diagnosis not present

## 2021-06-10 DIAGNOSIS — Z95828 Presence of other vascular implants and grafts: Secondary | ICD-10-CM

## 2021-06-10 LAB — CMP (CANCER CENTER ONLY)
ALT: 19 U/L (ref 0–44)
AST: 16 U/L (ref 15–41)
Albumin: 4.5 g/dL (ref 3.5–5.0)
Alkaline Phosphatase: 37 U/L — ABNORMAL LOW (ref 38–126)
Anion gap: 8 (ref 5–15)
BUN: 14 mg/dL (ref 6–20)
CO2: 28 mmol/L (ref 22–32)
Calcium: 9.8 mg/dL (ref 8.9–10.3)
Chloride: 104 mmol/L (ref 98–111)
Creatinine: 0.61 mg/dL (ref 0.44–1.00)
GFR, Estimated: >60 mL/min (ref >60)
Glucose, Bld: 90 mg/dL (ref 70–99)
Potassium: 3.9 mmol/L (ref 3.5–5.1)
Sodium: 140 mmol/L (ref 135–145)
Total Bilirubin: 0.4 mg/dL (ref 0.3–1.2)
Total Protein: 7.1 g/dL (ref 6.5–8.1)

## 2021-06-10 LAB — CBC WITH DIFFERENTIAL (CANCER CENTER ONLY)
Abs Immature Granulocytes: 0.03 10*3/uL (ref 0.00–0.07)
Basophils Absolute: 0 10*3/uL (ref 0.0–0.1)
Basophils Relative: 1 %
Eosinophils Absolute: 0 10*3/uL (ref 0.0–0.5)
Eosinophils Relative: 0 %
HCT: 36.4 % (ref 36.0–46.0)
Hemoglobin: 12.4 g/dL (ref 12.0–15.0)
Immature Granulocytes: 1 %
Lymphocytes Relative: 28 %
Lymphs Abs: 1.1 10*3/uL (ref 0.7–4.0)
MCH: 30.8 pg (ref 26.0–34.0)
MCHC: 34.1 g/dL (ref 30.0–36.0)
MCV: 90.5 fL (ref 80.0–100.0)
Monocytes Absolute: 0.3 10*3/uL (ref 0.1–1.0)
Monocytes Relative: 8 %
Neutro Abs: 2.4 10*3/uL (ref 1.7–7.7)
Neutrophils Relative %: 62 %
Platelet Count: 269 10*3/uL (ref 150–400)
RBC: 4.02 MIL/uL (ref 3.87–5.11)
RDW: 12.8 % (ref 11.5–15.5)
WBC Count: 4 10*3/uL (ref 4.0–10.5)
nRBC: 0 % (ref 0.0–0.2)

## 2021-06-10 MED ORDER — SODIUM CHLORIDE 0.9% FLUSH
10.0000 mL | INTRAVENOUS | Status: DC | PRN
  Administered 2021-06-10: 10 mL

## 2021-06-10 MED ORDER — HEPARIN SOD (PORK) LOCK FLUSH 100 UNIT/ML IV SOLN
500.0000 [IU] | Freq: Once | INTRAVENOUS | Status: AC | PRN
  Administered 2021-06-10: 500 [IU]

## 2021-06-10 MED ORDER — ONDANSETRON HCL 8 MG PO TABS
8.0000 mg | ORAL_TABLET | Freq: Once | ORAL | Status: AC
  Administered 2021-06-10: 8 mg via ORAL
  Filled 2021-06-10: qty 1

## 2021-06-10 MED ORDER — PACLITAXEL PROTEIN-BOUND CHEMO INJECTION 100 MG
80.0000 mg/m2 | Freq: Once | INTRAVENOUS | Status: AC
  Administered 2021-06-10: 125 mg via INTRAVENOUS
  Filled 2021-06-10: qty 25

## 2021-06-10 MED ORDER — SODIUM CHLORIDE 0.9% FLUSH
10.0000 mL | Freq: Once | INTRAVENOUS | Status: AC
  Administered 2021-06-10: 10 mL

## 2021-06-10 MED ORDER — SODIUM CHLORIDE 0.9 % IV SOLN: Freq: Once | INTRAVENOUS | Status: AC

## 2021-06-10 NOTE — Progress Notes (Signed)
?Radiation Oncology         [phone] ?________________________________ ? ?Name: Pamela Reyes        MRN: [id number]  ?Date of Service: 06/10/2021 DOB: August 30, 1980 ? ?YI:FOYDXA, Delfino Lovett, NP  [name], MD    ? ?REFERRING PHYSICIAN: [name], MD ? ? ?DIAGNOSIS: The encounter diagnosis was Malignant neoplasm of upper-outer quadrant of left breast in female, estrogen receptor positive ([place]). ? ? ?HISTORY OF PRESENT ILLNESS: Pamela DAYLEY is a 41 y.o. female originally seen in the multidisciplinary breast clinic for a new diagnosis of left breast cancer. The patient was noted to have a mass in the left breast. This measured 4.5 cm in the 12:00 position of the left breast. And she had one abnormal appearing left axillary node. A biopsy on 11/05/20 showed a grade 2 invasive ductal carcinoma, and her node was consistent with metastatic cancer. Her tumor was ER/PR positive, HER2 negative by FISH and her Ki 67 was 1%.   ? ?Since her last visit she underwent MRI on 11/21/20 that showed her known left breast mass with associated enhancement and retraction of the left nipple and smaller size of the left breast. The enhancement measured 6.5 cm, and an enlarged left axillary node. There was also a 1.3 cm upper central right breast lesion and a satellite nodule measuring 4 mm. There was also an indeterminate oval mass in the right breast and she underwent biopsy of the right breast on 11/27/20 that was negative for malignancy but showed PASH. She underwent left mastectomy and sentinel node biopsy on 01/06/22. Final pathology revealed a grade 2,  invasive ductal carcinoma measuring 6.5 cm involving the dermis of the nipple without epidermal involvement. Carcinoma was less than 1 mm from the posterior/deep margin. Additional margin excisions were negative for disease, and 4 of 4 sampled nodes were consistent with metastatic disease. She was counseled on the rationale for chemotherapy which she has received with chemotherapy  between 02/11/21 and 03/25/21 with AC-T and continues with Abraxane. She's seen to consider adjuvant therapy but has also been counseled on axillary lymph node dissection prior to radiotherapy. ? ?PREVIOUS RADIATION THERAPY: No ? ? ?PAST MEDICAL HISTORY:  ?Past Medical History:  ?Diagnosis Date  ? Anxiety   ? Breast cancer ([place])   ? Family history of breast cancer 11/11/2020  ? Fibroid 10/27/2020  ? Has 1 x .9 x .6 cm subserosal fibroid  ? History of hiatal hernia   ?   ? ? ?PAST SURGICAL HISTORY: ?Past Surgical History:  ?Procedure Laterality Date  ? BREAST RECONSTRUCTION WITH PLACEMENT OF TISSUE EXPANDER AND FLEX HD (ACELLULAR HYDRATED DERMIS) Left 01/06/2021  ? Procedure: LEFT BREAST RECONSTRUCTION WITH PLACEMENT OF TISSUE EXPANDER AND ACELLULAR  DERMIS;  Surgeon: Irene Limbo, MD;  Location: [facility];  Service: Plastics;  Laterality: Left;  ? MASTECTOMY W/ SENTINEL NODE BIOPSY Left 01/06/2021  ? Procedure: LEFT MASTECTOMY WITH MAGTRACE;  Surgeon: Stark Klein, MD;  Location: [facility];  Service: General;  Laterality: Left;  ? PORTACATH PLACEMENT Right 01/19/2021  ? Procedure: INSERTION PORT-A-CATH;  Surgeon: Stark Klein, MD;  Location: [facility];  Service: General;  Laterality: Right;  ? RADIOACTIVE SEED GUIDED AXILLARY SENTINEL LYMPH NODE Left 01/06/2021  ? Procedure: RADIOACTIVE SEED GUIDED SENTINEL LYMPH NODE BIOPSY;  Surgeon: Stark Klein, MD;  Location: [facility];  Service: General;  Laterality: Left;  ? SENTINEL NODE BIOPSY Left 01/06/2021  ? Procedure: LEFT AXILLARY SENTINEL LYMPH  NODE BIOPSY;  Surgeon: Stark Klein, MD;  Location[facility];  Service: General;  Laterality: Left;  ? WISDOM TOOTH EXTRACTION    ? ? ? ?FAMILY HISTORY:  ?Family History  ?Problem Relation Age of Onset  ? Congestive Heart Failure Father   ? Alcohol abuse Father   ? Heart disease Father   ? Breast cancer Sister 87  ?     ER+  ? Bladder Cancer Maternal Uncle   ?     smoking hx  ? Cancer Maternal Uncle   ?     kidney or prostate; dx after 50   ? Breast cancer Paternal Aunt   ?     dx after 87; x2 paternal aunts  ? Stroke Maternal Grandmother   ? Lung cancer Maternal Grandfather   ?     dx after 50; smoking hx  ? Breast cancer Paternal Grandmother   ?     dx after 17  ? ? ? ?SOCIAL HISTORY:  reports that she quit smoking about 19 years ago. Her smoking use included cigarettes. She has a 1.00 pack-year smoking history. She has never used smokeless tobacco. She reports that she does not currently use alcohol after a past usage of about 8.0 standard drinks per week. She reports that she does not use drugs. She is in a relationship with her fiance Gerald Stabs. She works for [employer] and lives in [place]. She's originally from [place]. She enjoys traveling and going to concerts.  ? ? ?ALLERGIES: Prochlorperazine ? ? ?MEDICATIONS:  ?Current Outpatient Medications  ?Medication Sig Dispense Refill  ? acetaminophen (TYLENOL) 500 MG tablet Take 500-1,000 mg by mouth every 6 (six) hours as needed for moderate pain or headache.    ? Cholecalciferol (DIALYVITE VITAMIN D 5000) 125 MCG (5000 UT) capsule Take 5,000 Units by mouth daily. Vit D and K supplement combined    ? diphenhydrAMINE (BENADRYL) 25 MG tablet Take 25 mg by mouth at bedtime as needed for sleep.    ? famotidine (PEPCID) 10 MG tablet Take 10 mg by mouth daily as needed for heartburn or indigestion.    ? lidocaine-prilocaine (EMLA) cream Apply to affected area once 30 g 3  ? loratadine (CLARITIN) 10 MG tablet Take 10 mg by mouth daily.    ? tretinoin (RETIN-A) 0.025 % cream Apply 1 application. topically at bedtime.    ? ?No current facility-administered medications for this encounter.  ? ? ? ?REVIEW OF SYSTEMS: On review of systems, the patient reports that she is doing okay. She continues to have some neuropathy in her arm and foot. No other specific complaints are noted.  ? ?  ? ?PHYSICAL EXAM:  ?Wt Readings from Last 3 Encounters:  ?06/08/21 139 lb 8 oz (63.3 kg)  ?06/03/21 138 lb (62.6  kg)  ?05/27/21 140 lb (63.5 kg)  ? ?Temp Readings from Last 3 Encounters:  ?06/07/21 98.2 ?F (36.8 ?C) (Oral)  ?06/03/21 98 ?F (36.7 ?C) (Oral)  ?05/27/21 98 ?F (36.7 ?C) (Oral)  ? ?BP Readings from Last 3 Encounters:  ?06/08/21 136/74  ?06/07/21 118/78  ?06/03/21 113/74  ? ?Pulse Readings from Last 3 Encounters:  ?06/08/21 94  ?06/07/21 80  ?06/03/21 95  ? ? ?In general this is a well appearing caucasian female in no acute distress. She's alert and oriented x4 and appropriate throughout the examination. Cardiopulmonary assessment is negative for acute distress and she exhibits normal effort. The left mastectomy site shows a well healed surgical site without erythema, separation or drainage. An in  situ tissue expander is in place.  ? ? ? ?ECOG = 1 ? ?0 - Asymptomatic (Fully active, able to carry on all predisease activities without restriction) ? ?1 - Symptomatic but completely ambulatory (Restricted in physically strenuous activity but ambulatory and able to carry out work of a light or sedentary nature. For example, light housework, office work) ? ?2 - Symptomatic, <50% in bed during the day (Ambulatory and capable of all self care but unable to carry out any work activities. Up and about more than 50% of waking hours) ? ?3 - Symptomatic, >50% in bed, but not bedbound (Capable of only limited self-care, confined to bed or chair 50% or more of waking hours) ? ?4 - Bedbound (Completely disabled. Cannot carry on any self-care. Totally confined to bed or chair) ? ?5 - Death ? ? Oken MM, Creech RH, Tormey DC, et al. [phone]). "Toxicity and response criteria of the [facility]". [facility] Oncol. 5 (6): 649-55 ? ? ? ?LABORATORY DATA:  ?Lab Results  ?Component Value Date  ? WBC 1.9 (L) 06/03/2021  ? HGB 12.7 06/03/2021  ? HCT 36.3 06/03/2021  ? MCV 90.1 06/03/2021  ? PLT 246 06/03/2021  ? ?Lab Results  ?Component Value Date  ? NA 139 06/03/2021  ? K 4.0 06/03/2021  ? CL 104 06/03/2021  ? CO2 28  06/03/2021  ? ?Lab Results  ?Component Value Date  ? ALT 22 06/03/2021  ? AST 19 06/03/2021  ? ALKPHOS 29 (L) 06/03/2021  ? BILITOT 0.4 06/03/2021  ? ?  ? ?RADIOGRAPHY: No results found. ?   ? ?IMPRESSION/P

## 2021-06-10 NOTE — Patient Instructions (Signed)
[facility]  ? Discharge Instructions: ?Thank you for choosing [facility] to provide your oncology and hematology care.  ? ?If you have a lab appointment with the [facility], please go directly to the [facility] and check in at the registration area. ?  ?Wear comfortable clothing and clothing appropriate for easy access to any Portacath or PICC line.  ? ?We strive to give you quality time with your provider. You may need to reschedule your appointment if you arrive late (15 or more minutes).  Arriving late affects you and other patients whose appointments are after yours.  Also, if you miss three or more appointments without notifying the office, you may be dismissed from the clinic at the provider?s discretion.    ?  ?For prescription refill requests, have your pharmacy contact our office and allow 72 hours for refills to be completed.   ? ?Today you received the following chemotherapy and/or immunotherapy agents: paclitaxel protein-bound    ?  ?To help prevent nausea and vomiting after your treatment, we encourage you to take your nausea medication as directed. ? ?BELOW ARE SYMPTOMS THAT SHOULD BE REPORTED IMMEDIATELY: ?*FEVER GREATER THAN 100.4 F (38 ?C) OR HIGHER ?*CHILLS OR SWEATING ?*NAUSEA AND VOMITING THAT IS NOT CONTROLLED WITH YOUR NAUSEA MEDICATION ?*UNUSUAL SHORTNESS OF BREATH ?*UNUSUAL BRUISING OR BLEEDING ?*URINARY PROBLEMS (pain or burning when urinating, or frequent urination) ?*BOWEL PROBLEMS (unusual diarrhea, constipation, pain near the anus) ?TENDERNESS IN MOUTH AND THROAT WITH OR WITHOUT PRESENCE OF ULCERS (sore throat, sores in mouth, or a toothache) ?UNUSUAL RASH, SWELLING OR PAIN  ?UNUSUAL VAGINAL DISCHARGE OR ITCHING  ? ?Items with * indicate a potential emergency and should be followed up as soon as possible or go to the Emergency Department if any problems should occur. ? ?Please show the CHEMOTHERAPY ALERT CARD or IMMUNOTHERAPY ALERT  CARD at check-in to the Emergency Department and triage nurse. ? ?Should you have questions after your visit or need to cancel or reschedule your appointment, please contact [facility]  Dept: [phone]  and follow the prompts.  Office hours are 8:00 a.m. to 4:30 p.m. Monday - Friday. Please note that voicemails left after 4:00 p.m. may not be returned until the following business day.  We are closed weekends and major holidays. You have access to a nurse at all times for urgent questions. Please call the main number to the clinic Dept: [phone] and follow the prompts. ? ? ?For any non-urgent questions, you may also contact your provider using MyChart. We now offer e-Visits for anyone 50 and older to request care online for non-urgent symptoms. For details visit [email or web address]. ?  ?Also download the MyChart app! Go to the app store, search "MyChart", open the app, select [facility], and log in with your MyChart username and password. ? ?Due to Covid, a mask is required upon entering the hospital/clinic. If you do not have a mask, one will be given to you upon arrival. For doctor visits, patients may have 1 support person aged 26 or older with them. For treatment visits, patients cannot have anyone with them due to current Covid guidelines and our immunocompromised population.  ? ?

## 2021-06-10 NOTE — Progress Notes (Signed)
Consult appointment. I verified patient identity and began nursing interview. Patient reports discomfort in the LT axilla 3/10. No other issues reported at this time. ? ?Meaningful use complete. ?Last menstrual cycle, January 2023 (due to chemo tx's). Patient states "No chances of pregnancy." ? ?BP 111/89 (BP Location: Right Arm, Patient Position: Sitting, Cuff Size: Normal)   Pulse 98   Temp (!) 96.9 ?F (36.1 ?C) (Temporal)   Resp 18   Ht '5\' 4"'$  (1.626 m)   Wt 137 lb 8 oz (62.4 kg)   SpO2 100%   BMI 23.60 kg/m?  ? ?

## 2021-06-15 ENCOUNTER — Other Ambulatory Visit: Payer: Self-pay | Admitting: General Surgery

## 2021-06-15 ENCOUNTER — Encounter: Payer: Self-pay | Admitting: Physical Therapy

## 2021-06-16 ENCOUNTER — Encounter: Payer: Self-pay | Admitting: Physical Therapy

## 2021-06-16 ENCOUNTER — Ambulatory Visit (HOSPITAL_COMMUNITY)
Admission: RE | Admit: 2021-06-16 | Discharge: 2021-06-16 | Disposition: A | Discharge status: Home / Self Care | Payer: BC Managed Care – PPO | Source: Ambulatory Visit | Attending: Hematology | Admitting: Hematology

## 2021-06-16 DIAGNOSIS — C50412 Malignant neoplasm of upper-outer quadrant of left female breast: Secondary | ICD-10-CM | POA: Insufficient documentation

## 2021-06-16 DIAGNOSIS — Z170 Estrogen receptor positive status [ER+]: Secondary | ICD-10-CM | POA: Insufficient documentation

## 2021-06-16 LAB — GLUCOSE, CAPILLARY: Glucose-Capillary: 98 mg/dL (ref 70–99)

## 2021-06-16 IMAGING — PT NM PET TUM IMG INITIAL (PI) SKULL BASE T - THIGH
1 of 8 series · 1 of 25 positions shown · non-contrast
Comparison: CT and nuclear medicine bone scan from [DATE].

CLINICAL DATA: Initial treatment strategy for left breast neoplasm.

EXAM:
NUCLEAR MEDICINE PET SKULL BASE TO THIGH
TECHNIQUE: 6.83 mCi F-18 FDG was injected intravenously. Full-ring PET imaging
was performed from the skull base to thigh after the radiotracer. CT
data was obtained and used for attenuation correction and anatomic
localization.
Fasting blood glucose: 98 mg/dl

[Series 4: ct sk_thigh 5.0 br38 · axial · 5.0mm · 0.98mm/px · 1 of 229 slices shown]
[im 229/229  brain]
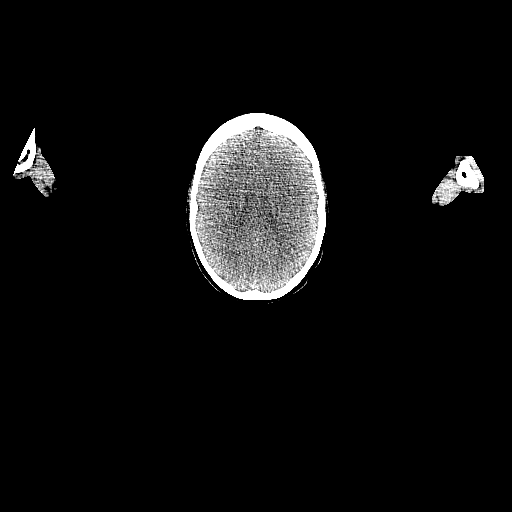

[1 of 25 positions shown; findings below may reference images not displayed]

FINDINGS: Mediastinal blood pool activity: SUV max

Liver activity: SUV max NA

NECK: No hypermetabolic cervical adenopathy.

Symmetric hypermetabolic tonsillar hyperplasia is commonly reactive.

Incidental CT findings: Streak artifact from dental hardware.

CHEST: Surgical changes of left mastectomy and left axillary lymph
node dissection with breast tissue expander in place, there is mild
hypermetabolic activity about the surgical bed for instance along
the left axillary clips with a max SUV of 1.95, favored to reflect
postsurgical change.

No hypermetabolic thoracic adenopathy or suspicious pulmonary
nodules/masses.

Incidental CT findings: Right chest Port-A-Cath with tip at the
superior cavoatrial junction. No suspicious pulmonary nodules or
masses.

ABDOMEN/PELVIS: No abnormal hypermetabolic activity within the
liver, pancreas, adrenal glands, or spleen. No hypermetabolic lymph
nodes in the abdomen or pelvis.

Incidental CT findings: none

SKELETON: No focal hypermetabolic activity to suggest skeletal
metastasis.

Incidental CT findings: none
IMPRESSION: 1. Postsurgical change of left mastectomy and left axillary lymph
node dissection with tissue expander in place. Mild hypermetabolic
activity about the surgical site without suspicious focal
hypermetabolic soft tissue nodularity, favored to reflect
postsurgical change.

2. No convincing scintigraphic evidence of hypermetabolic local
recurrence or metastatic disease within the neck, chest, abdomen or
pelvis.

## 2021-06-16 MED ORDER — FLUDEOXYGLUCOSE F - 18 (FDG) INJECTION
6.8000 | Freq: Once | INTRAVENOUS | Status: AC
  Administered 2021-06-16: 6.8 via INTRAVENOUS

## 2021-06-17 ENCOUNTER — Encounter: Payer: Self-pay | Admitting: Hematology

## 2021-06-17 ENCOUNTER — Inpatient Hospital Stay: Payer: BC Managed Care – PPO

## 2021-06-17 ENCOUNTER — Other Ambulatory Visit: Payer: Self-pay

## 2021-06-17 ENCOUNTER — Inpatient Hospital Stay (HOSPITAL_BASED_OUTPATIENT_CLINIC_OR_DEPARTMENT_OTHER): Payer: BC Managed Care – PPO | Admitting: Hematology

## 2021-06-17 ENCOUNTER — Encounter: Payer: Self-pay | Admitting: *Deleted

## 2021-06-17 VITALS — BP 124/74 | HR 88 | Temp 98.9°F | Resp 18 | Ht 64.0 in | Wt 137.3 lb

## 2021-06-17 DIAGNOSIS — C50412 Malignant neoplasm of upper-outer quadrant of left female breast: Secondary | ICD-10-CM | POA: Diagnosis not present

## 2021-06-17 DIAGNOSIS — Z170 Estrogen receptor positive status [ER+]: Secondary | ICD-10-CM

## 2021-06-17 DIAGNOSIS — Z95828 Presence of other vascular implants and grafts: Secondary | ICD-10-CM

## 2021-06-17 DIAGNOSIS — Z5111 Encounter for antineoplastic chemotherapy: Secondary | ICD-10-CM | POA: Diagnosis not present

## 2021-06-17 LAB — CBC WITH DIFFERENTIAL (CANCER CENTER ONLY)
Abs Immature Granulocytes: 0.03 10*3/uL (ref 0.00–0.07)
Basophils Absolute: 0 10*3/uL (ref 0.0–0.1)
Basophils Relative: 1 %
Eosinophils Absolute: 0 10*3/uL (ref 0.0–0.5)
Eosinophils Relative: 0 %
HCT: 36.1 % (ref 36.0–46.0)
Hemoglobin: 12.6 g/dL (ref 12.0–15.0)
Immature Granulocytes: 1 %
Lymphocytes Relative: 38 %
Lymphs Abs: 1.1 10*3/uL (ref 0.7–4.0)
MCH: 31.3 pg (ref 26.0–34.0)
MCHC: 34.9 g/dL (ref 30.0–36.0)
MCV: 89.6 fL (ref 80.0–100.0)
Monocytes Absolute: 0.3 10*3/uL (ref 0.1–1.0)
Monocytes Relative: 9 %
Neutro Abs: 1.5 10*3/uL — ABNORMAL LOW (ref 1.7–7.7)
Neutrophils Relative %: 51 %
Platelet Count: 267 10*3/uL (ref 150–400)
RBC: 4.03 MIL/uL (ref 3.87–5.11)
RDW: 12.7 % (ref 11.5–15.5)
WBC Count: 3 10*3/uL — ABNORMAL LOW (ref 4.0–10.5)
nRBC: 0 % (ref 0.0–0.2)

## 2021-06-17 LAB — CMP (CANCER CENTER ONLY)
ALT: 17 U/L (ref 0–44)
AST: 17 U/L (ref 15–41)
Albumin: 4.5 g/dL (ref 3.5–5.0)
Alkaline Phosphatase: 29 U/L — ABNORMAL LOW (ref 38–126)
Anion gap: 6 (ref 5–15)
BUN: 14 mg/dL (ref 6–20)
CO2: 29 mmol/L (ref 22–32)
Calcium: 9.8 mg/dL (ref 8.9–10.3)
Chloride: 104 mmol/L (ref 98–111)
Creatinine: 0.62 mg/dL (ref 0.44–1.00)
GFR, Estimated: >60 mL/min (ref >60)
Glucose, Bld: 87 mg/dL (ref 70–99)
Potassium: 4 mmol/L (ref 3.5–5.1)
Sodium: 139 mmol/L (ref 135–145)
Total Bilirubin: 0.4 mg/dL (ref 0.3–1.2)
Total Protein: 7.3 g/dL (ref 6.5–8.1)

## 2021-06-17 MED ORDER — PACLITAXEL PROTEIN-BOUND CHEMO INJECTION 100 MG
80.0000 mg/m2 | Freq: Once | INTRAVENOUS | Status: AC
  Administered 2021-06-17: 125 mg via INTRAVENOUS
  Filled 2021-06-17: qty 25

## 2021-06-17 MED ORDER — SODIUM CHLORIDE 0.9% FLUSH
10.0000 mL | INTRAVENOUS | Status: DC | PRN
  Administered 2021-06-17: 10 mL

## 2021-06-17 MED ORDER — SODIUM CHLORIDE 0.9% FLUSH
10.0000 mL | Freq: Once | INTRAVENOUS | Status: AC
  Administered 2021-06-17: 10 mL

## 2021-06-17 MED ORDER — HEPARIN SOD (PORK) LOCK FLUSH 100 UNIT/ML IV SOLN
500.0000 [IU] | Freq: Once | INTRAVENOUS | Status: AC | PRN
  Administered 2021-06-17: 500 [IU]

## 2021-06-17 MED ORDER — SODIUM CHLORIDE 0.9 % IV SOLN: Freq: Once | INTRAVENOUS | Status: AC

## 2021-06-17 MED ORDER — ONDANSETRON HCL 8 MG PO TABS
8.0000 mg | ORAL_TABLET | Freq: Once | ORAL | Status: AC
  Administered 2021-06-17: 8 mg via ORAL
  Filled 2021-06-17: qty 1

## 2021-06-17 NOTE — Progress Notes (Signed)
?[facility]   ?Telephone:(336) [phone] [phone]   ?Clinic Follow up Note  ? ?Patient Care Team: ?Maximiano Coss, NP as PCP - General (Adult Health Nurse Practitioner) ?Mauro Kaufmann, RN as Oncology Nurse Navigator ?Rockwell Germany, RN as Oncology Nurse Navigator ?Stark Klein, MD as Consulting Physician (General Surgery) ?[name], MD as Consulting Physician (Hematology) ?Kyung Rudd, MD as Consulting Physician (Radiation Oncology) ?Estill Dooms, NP as Nurse Practitioner (Obstetrics and Gynecology) ? ?Date of Service:  06/17/2021 ? ?CHIEF COMPLAINT: f/u of left breast cancer ? ?CURRENT THERAPY:  ?Adjuvant chemo Abraxane  ? ?ASSESSMENT & PLAN:  ?Pamela Reyes is a 40 y.o. pre-menopausal female with  ? ?1. 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 The mass has grown significantly since 2017. Biopsy on 11/05/20 confirmed invasive ductal carcinoma, with metastatic involvement of one lymph node. ?-staging CT CAP and bone scan on 11/19/20 showed no distant metastasis ?-she underwent left mastectomy on 01/06/21 under Dr. Barry Dienes. Pathology showed 6.5 cm invasive ductal carcinoma, grade 2, involving dermis of nipple without epidermal involvement. Margins were negative, but all 4 lymph nodes showed metastatic disease. ?-Due to the large tumor and 4 positive lymph nodes, she received 4 cycles AC 02/11/21 - 03/25/21. ?-she started Abraxane on 04/08/21. She tolerated well overall, no complaints. Last dose scheduled for 06/24/21. ?-she was seen by Dr. Lisbeth Renshaw on 06/10/21, plan for post-mastectomy radiation after ALND. ?-PET scan yesterday, 06/16/21, showed postsurgical change from left mastectomy, no convincing evidence of hypermetabolic local recurrence or metastatic disease. I reviewed the results with her today. ?-plan  to start Zoladex injections next week, in anticipation of beginning letrozole in a month  ?-she is scheduled for left ALND on 07/07/21 with Dr. Barry Dienes. We will request port removal to be done that day. ?-labs reviewed, overall stable, adequate to proceed with week 11 Abraxane today at same dose. ?-Due to her multiple positive lymph nodes, I also recommend adjuvant Verzenio for 2 years.  Benefit and potential side effects reviewed with her, she is interested.  Plan to start after she completes radiation. ?  ?2. Left side peripheral Neuropathy, G1 ?-present prior to breast cancer diagnosis. ?-possibly alcohol-related from prior use, discontinued alcohol with cancer diagnosis. ?-present intermittently and predominantly in her left hand and foot, none on right side. ?-she has started B complex, serum B12 and methylmalonic acid level were normal. ?-because she has breast tissue expanders currently in place, she cannot have MRI  ?-she met Dr. Krista Blue on 06/08/21. They will monitor for now and proceed with MRI after breast reconstruction. ?  ?3. Genetics ?-she has a strong family history of breast cancer. She reports her sister, who was diagnosed at age 85 and has since recovered, had negative genetic testing. ?-genetic testing performed 11/11/20, results were negative. ?  ?4. Anxiety and stress ?-she has been connected with our social worker ?  ?5. Health maintenance ?-She had pap/pelvic 02/10/21 ?-she takes vitamin C, D, B complex, and pre/probiotic for GI health. Ok to continue  ?  ?  ?PLAN: ?-proceed with Abraxane today and next week (last dose) ?-She is scheduled for axillary lymph node dissection on 5/31  ?-Zoladex injection in 1 and 5 weeks ?-lab on 5/29 (before her surgery) ?-lab and f/u with PA Murray Hodgkins in 5 weeks, to start letrozole, plan to continue letrozole through her  radiation  ? ? ?No problem-specific Assessment & Plan notes found for this encounter. ? ? ?SUMMARY OF ONCOLOGIC HISTORY: ?Oncology History Overview Note  ?  Cancer Staging  ?Malignant neoplasm of upper-outer quadrant of left breast in female, estrogen receptor positive ([place]) ?Staging form: Breast, AJCC 8th Edition ?- Clinical stage from 11/05/2020: Stage IIA (cT2, cN1, cM0, G1, ER+, PR+, HER2: Equivocal) - Signed by [name], MD on 11/10/2020 ?- Pathologic stage from 01/06/2021: Stage IB (pT3, pN2a, cM0, G2, ER+, PR+, HER2-) - Signed by [name], MD on 01/12/2021 ? ? ?  ?Malignant neoplasm of upper-outer quadrant of left breast in female, estrogen receptor positive ([facility])  ?10/28/2020 Mammogram  ? Bilateral Diagnostic Mammogram; Left Breast Ultrasound ? ?IMPRESSION: ?1. Suspicious palpable mass/hypoechoic area in the left breast at 12 o'clock retroareolar measuring 4.5 cm. ?  ?2.  Suspicious lymph node in the left axilla. ?  ?11/05/2020 Cancer Staging  ? Staging form: Breast, AJCC 8th Edition ?- Clinical stage from 11/05/2020: Stage IIA (cT2, cN1, cM0, G1, ER+, PR+, HER2: Equivocal) - Signed by [name], MD on 11/10/2020 ?Stage prefix: Initial diagnosis ?Histologic grading system: 3 grade system ? ?  ?11/05/2020 Pathology Results  ? Diagnosis ?1. Breast, left, needle core biopsy, 12 o'clock subareolar, ribbon clip ?- INVASIVE MAMMARY CARCINOMA. SEE NOTE ?1. Carcinoma measures 1.5 cm in greatest linear dimension and appears grade 2. ?1. Immunohistochemical stain for E-cadherin is positive in the tumor cells, consistent with a ductal phenotype. ?1. PROGNOSTIC INDICATORS ?Results: ?The tumor cells are EQUIVOCAL for Her2 (2+). Her2 by FISH will be performed and results reported separately. ?Estrogen Receptor: 70%, POSITIVE, STRONG-MODERATE STAINING INTENSITY ?Progesterone Receptor: 80%, POSITIVE, MODERATE STAINING INTENSITY ?Proliferation Marker Ki67: 1% ? ?2. Lymph node, needle/core biopsy, left axilla, tribell clip ?- METASTATIC CARCINOMA TO A LYMPH NODE. SEE NOTE ?2. Largest contiguous focus of metastatic carcinoma measures 0.3 cm. ?2. PROGNOSTIC INDICATORS ?Results: ?The  tumor cells are EQUIVOCAL for Her2 (2+). Her2 by FISH will be performed and results reported separately. ?Estrogen Receptor: 90%, POSITIVE, STRONG STAINING INTENSITY ?Progesterone Receptor: 95%, POSITIVE, STRONG STAINING INTENSITY ?Proliferation Marker Ki67: 1% ?  ?11/10/2020 Initial Diagnosis  ? Malignant neoplasm of upper-outer quadrant of left breast in female, estrogen receptor positive ([place]) ? ?  ?11/19/2020 Imaging  ? EXAM: ?CT CHEST, ABDOMEN, AND PELVIS WITH CONTRAST ? ?IMPRESSION: ?1. Left breast mass  as previously described. ?2. Enhancing subcentimeter left axillary lymph nodes. ?3. No evidence of distal metastatic disease. ?4. Small hiatal hernia ?  ?11/19/2020 Imaging  ? EXAM: ?NUCLEAR MEDICINE WHOLE BODY BONE SCAN ? ?IMPRESSION: ?No evidence of metastatic disease. ?  ?11/21/2020 Imaging  ? EXAM: ?BILATERAL BREAST MRI WITH AND WITHOUT CONTRAST ? ?IMPRESSION: ?1. Suspicious 1.3 centimeter mass in the anterior UPPER central ?RIGHT breast warranting tissue diagnosis. (Image 65 of series 7). ?2. 4 millimeter possible satellite nodule posterior to the 1.3 ?centimeter mass in the RIGHT breast. (Image 63 of series 7). ?3. Indeterminate oval mass in the UPPER central middle depth of the RIGHT breast warranting tissue diagnosis. (Image 60 of series 7). ?4. Large area of enhancement in the central portion of the LEFT ?breast, involving all quadrants. There is associated enhancement and retraction of the LEFT nipple, with significantly smaller size of ?the LEFT breast. Enhancement spans at least 6.5 centimeters. ?5. Enlarged, previously biopsied LEFT axillary lymph node. ?  ?11/24/2020 Genetic Testing  ? Negative hereditary cancer genetic testing: no pathogenic variants detected in [facility] BRCAPlus STAT Panel or [facility] CustomNext-Cancer +RNAinsight  Panel.  The report dates are November 24, 2020 and November 27, 2020, respectively.  ? ?The BRCAplus panel offered by [employer] and includes sequencing and  deletion/duplication analysis for the following 8 genes: ATM, BRCA1, BRCA2, CDH1, CHEK2, PALB2, PTEN, and TP53.  The CustomNext-Cancer+RNAinsight panel offered by [facility] includes sequencing and rearrangement ana

## 2021-06-17 NOTE — Progress Notes (Signed)
Ok to treat with ANC of 1.5 per Dr.Feng ?

## 2021-06-17 NOTE — Patient Instructions (Signed)
Pamela Reyes  Discharge Instructions: ?Thank you for choosing [facility] to provide your oncology and hematology care.  ? ?If you have a lab appointment with the [facility], please go directly to the [facility] and check in at the registration area. ?  ?Wear comfortable clothing and clothing appropriate for easy access to any Portacath or PICC line.  ? ?We strive to give you quality time with your provider. You may need to reschedule your appointment if you arrive late (15 or more minutes).  Arriving late affects you and other patients whose appointments are after yours.  Also, if you miss three or more appointments without notifying the office, you may be dismissed from the clinic at the provider?s discretion.    ?  ?For prescription refill requests, have your pharmacy contact our office and allow 72 hours for refills to be completed.   ? ?Today you received the following chemotherapy and/or immunotherapy agent: Abraxane    ?  ?To help prevent nausea and vomiting after your treatment, we encourage you to take your nausea medication as directed. ? ?BELOW ARE SYMPTOMS THAT SHOULD BE REPORTED IMMEDIATELY: ?*FEVER GREATER THAN 100.4 F (38 ?C) OR HIGHER ?*CHILLS OR SWEATING ?*NAUSEA AND VOMITING THAT IS NOT CONTROLLED WITH YOUR NAUSEA MEDICATION ?*UNUSUAL SHORTNESS OF BREATH ?*UNUSUAL BRUISING OR BLEEDING ?*URINARY PROBLEMS (pain or burning when urinating, or frequent urination) ?*BOWEL PROBLEMS (unusual diarrhea, constipation, pain near the anus) ?TENDERNESS IN MOUTH AND THROAT WITH OR WITHOUT PRESENCE OF ULCERS (sore throat, sores in mouth, or a toothache) ?UNUSUAL RASH, SWELLING OR PAIN  ?UNUSUAL VAGINAL DISCHARGE OR ITCHING  ? ?Items with * indicate a potential emergency and should be followed up as soon as possible or go to the Emergency Department if any problems should occur. ? ?Please show the CHEMOTHERAPY ALERT CARD or IMMUNOTHERAPY ALERT CARD at check-in to  the Emergency Department and triage nurse. ? ?Should you have questions after your visit or need to cancel or reschedule your appointment, please contact [facility]  Dept: [phone]  and follow the prompts.  Office hours are 8:00 a.m. to 4:30 p.m. Monday - Friday. Please note that voicemails left after 4:00 p.m. may not be returned until the following business day.  We are closed weekends and major holidays. You have access to a nurse at all times for urgent questions. Please call the main number to the clinic Dept: [phone] and follow the prompts. ? ? ?For any non-urgent questions, you may also contact your provider using MyChart. We now offer e-Visits for anyone 34 and older to request care online for non-urgent symptoms. For details visit [email or web address]. ?  ?Also download the MyChart app! Go to the app store, search "MyChart", open the app, select [facility], and log in with your MyChart username and password. ? ?Due to Covid, a mask is required upon entering the hospital/clinic. If you do not have a mask, one will be given to you upon arrival. For doctor visits, patients may have 1 support person aged 63 or older with them. For treatment visits, patients cannot have anyone with them due to current Covid guidelines and our immunocompromised population.  ? ?

## 2021-06-18 ENCOUNTER — Inpatient Hospital Stay: Payer: BC Managed Care – PPO | Admitting: Licensed Clinical Social Worker

## 2021-06-18 DIAGNOSIS — Z170 Estrogen receptor positive status [ER+]: Secondary | ICD-10-CM

## 2021-06-18 NOTE — Progress Notes (Signed)
[facility] CSW Progress Note ? ?Clinical Social Worker met with patient for supportive counseling.  Pt reports doing well overall since out last meeting w/ ups and downs regarding upcoming surgery.  Pt confident in her decision to move forward with surgery.  Pt demonstrates self awareness regarding triggers for anxiety and the ways in which she at times exacerbates her anxiety.  Dialectical behavior therapy introduced to pt as a strategy to stop intrusive thoughts.  Setting personal boundaries discussed as well to limit the amount of time spent on research.  Pt encouraged to engage in a new activity weekly such as yoga or tai chi both for the physical benefits, and to engage in socialization.  Pt's upcoming trip to see her family discussed regarding the change in pt's appearance since the last time she saw her family.  Pt encouraged to explore taking off her head covering in select public spaces to gauge how comfortable she may be doing that in front of family to help decide if she wants to cover her head while home.  Emotional support provided.  CSW to see pt again in 2 weeks. ? ? ? ?Henriette Combs, LCSW  ?

## 2021-06-21 ENCOUNTER — Encounter: Payer: Self-pay | Admitting: Radiation Oncology

## 2021-06-24 ENCOUNTER — Other Ambulatory Visit: Payer: Self-pay | Admitting: Hematology

## 2021-06-24 ENCOUNTER — Other Ambulatory Visit: Payer: Self-pay

## 2021-06-24 ENCOUNTER — Inpatient Hospital Stay: Payer: BC Managed Care – PPO

## 2021-06-24 ENCOUNTER — Encounter: Payer: Self-pay | Admitting: *Deleted

## 2021-06-24 VITALS — BP 124/90 | HR 99 | Temp 98.2°F | Resp 18

## 2021-06-24 DIAGNOSIS — Z170 Estrogen receptor positive status [ER+]: Secondary | ICD-10-CM

## 2021-06-24 DIAGNOSIS — Z95828 Presence of other vascular implants and grafts: Secondary | ICD-10-CM

## 2021-06-24 DIAGNOSIS — Z5111 Encounter for antineoplastic chemotherapy: Secondary | ICD-10-CM | POA: Diagnosis not present

## 2021-06-24 DIAGNOSIS — C50412 Malignant neoplasm of upper-outer quadrant of left female breast: Secondary | ICD-10-CM | POA: Diagnosis not present

## 2021-06-24 LAB — CMP (CANCER CENTER ONLY)
ALT: 17 U/L (ref 0–44)
AST: 16 U/L (ref 15–41)
Albumin: 4.5 g/dL (ref 3.5–5.0)
Alkaline Phosphatase: 30 U/L — ABNORMAL LOW (ref 38–126)
Anion gap: 9 (ref 5–15)
BUN: 14 mg/dL (ref 6–20)
CO2: 27 mmol/L (ref 22–32)
Calcium: 9.8 mg/dL (ref 8.9–10.3)
Chloride: 104 mmol/L (ref 98–111)
Creatinine: 0.66 mg/dL (ref 0.44–1.00)
GFR, Estimated: >60 mL/min (ref >60)
Glucose, Bld: 92 mg/dL (ref 70–99)
Potassium: 3.8 mmol/L (ref 3.5–5.1)
Sodium: 140 mmol/L (ref 135–145)
Total Bilirubin: 0.4 mg/dL (ref 0.3–1.2)
Total Protein: 7.2 g/dL (ref 6.5–8.1)

## 2021-06-24 LAB — CBC WITH DIFFERENTIAL (CANCER CENTER ONLY)
Abs Immature Granulocytes: 0.02 10*3/uL (ref 0.00–0.07)
Basophils Absolute: 0 10*3/uL (ref 0.0–0.1)
Basophils Relative: 1 %
Eosinophils Absolute: 0 10*3/uL (ref 0.0–0.5)
Eosinophils Relative: 0 %
HCT: 35.5 % — ABNORMAL LOW (ref 36.0–46.0)
Hemoglobin: 12.5 g/dL (ref 12.0–15.0)
Immature Granulocytes: 1 %
Lymphocytes Relative: 43 %
Lymphs Abs: 1 10*3/uL (ref 0.7–4.0)
MCH: 31.3 pg (ref 26.0–34.0)
MCHC: 35.2 g/dL (ref 30.0–36.0)
MCV: 88.8 fL (ref 80.0–100.0)
Monocytes Absolute: 0.2 10*3/uL (ref 0.1–1.0)
Monocytes Relative: 9 %
Neutro Abs: 1.1 10*3/uL — ABNORMAL LOW (ref 1.7–7.7)
Neutrophils Relative %: 46 %
Platelet Count: 257 10*3/uL (ref 150–400)
RBC: 4 MIL/uL (ref 3.87–5.11)
RDW: 12.9 % (ref 11.5–15.5)
WBC Count: 2.4 10*3/uL — ABNORMAL LOW (ref 4.0–10.5)
nRBC: 0 % (ref 0.0–0.2)

## 2021-06-24 MED ORDER — SODIUM CHLORIDE 0.9 % IV SOLN: Freq: Once | INTRAVENOUS | Status: AC

## 2021-06-24 MED ORDER — GOSERELIN ACETATE 3.6 MG ~~LOC~~ IMPL
3.6000 mg | DRUG_IMPLANT | Freq: Once | SUBCUTANEOUS | Status: AC
  Administered 2021-06-24: 3.6 mg via SUBCUTANEOUS
  Filled 2021-06-24: qty 3.6

## 2021-06-24 MED ORDER — ONDANSETRON HCL 8 MG PO TABS
8.0000 mg | ORAL_TABLET | Freq: Once | ORAL | Status: AC
  Administered 2021-06-24: 8 mg via ORAL
  Filled 2021-06-24: qty 1

## 2021-06-24 MED ORDER — SODIUM CHLORIDE 0.9% FLUSH
10.0000 mL | INTRAVENOUS | Status: DC | PRN
  Administered 2021-06-24: 10 mL

## 2021-06-24 MED ORDER — SODIUM CHLORIDE 0.9% FLUSH
10.0000 mL | Freq: Once | INTRAVENOUS | Status: AC
  Administered 2021-06-24: 10 mL

## 2021-06-24 MED ORDER — PACLITAXEL PROTEIN-BOUND CHEMO INJECTION 100 MG
80.0000 mg/m2 | Freq: Once | INTRAVENOUS | Status: AC
  Administered 2021-06-24: 125 mg via INTRAVENOUS
  Filled 2021-06-24: qty 25

## 2021-06-24 MED ORDER — HEPARIN SOD (PORK) LOCK FLUSH 100 UNIT/ML IV SOLN
500.0000 [IU] | Freq: Once | INTRAVENOUS | Status: AC | PRN
  Administered 2021-06-24: 500 [IU]

## 2021-06-24 NOTE — Patient Instructions (Signed)
[facility]  Discharge Instructions: Thank you for choosing [facility] to provide your oncology and hematology care.   If you have a lab appointment with the [facility], please go directly to the [facility] and check in at the registration area.   Wear comfortable clothing and clothing appropriate for easy access to any Portacath or PICC line.   We strive to give you quality time with your provider. You may need to reschedule your appointment if you arrive late (15 or more minutes).  Arriving late affects you and other patients whose appointments are after yours.  Also, if you miss three or more appointments without notifying the office, you may be dismissed from the clinic at the provider's discretion.      For prescription refill requests, have your pharmacy contact our office and allow 72 hours for refills to be completed.    Today you received the following chemotherapy and/or immunotherapy agents abaxane      To help prevent nausea and vomiting after your treatment, we encourage you to take your nausea medication as directed.  BELOW ARE SYMPTOMS THAT SHOULD BE REPORTED IMMEDIATELY: *FEVER GREATER THAN 100.4 F (38 C) OR HIGHER *CHILLS OR SWEATING *NAUSEA AND VOMITING THAT IS NOT CONTROLLED WITH YOUR NAUSEA MEDICATION *UNUSUAL SHORTNESS OF BREATH *UNUSUAL BRUISING OR BLEEDING *URINARY PROBLEMS (pain or burning when urinating, or frequent urination) *BOWEL PROBLEMS (unusual diarrhea, constipation, pain near the anus) TENDERNESS IN MOUTH AND THROAT WITH OR WITHOUT PRESENCE OF ULCERS (sore throat, sores in mouth, or a toothache) UNUSUAL RASH, SWELLING OR PAIN  UNUSUAL VAGINAL DISCHARGE OR ITCHING   Items with * indicate a potential emergency and should be followed up as soon as possible or go to the Emergency Department if any problems should occur.  Please show the CHEMOTHERAPY ALERT CARD or IMMUNOTHERAPY ALERT CARD at check-in to the  Emergency Department and triage nurse.  Should you have questions after your visit or need to cancel or reschedule your appointment, please contact [facility]  Dept: [phone]  and follow the prompts.  Office hours are 8:00 a.m. to 4:30 p.m. Monday - Friday. Please note that voicemails left after 4:00 p.m. may not be returned until the following business day.  We are closed weekends and major holidays. You have access to a nurse at all times for urgent questions. Please call the main number to the clinic Dept: [phone] and follow the prompts.   For any non-urgent questions, you may also contact your provider using MyChart. We now offer e-Visits for anyone 90 and older to request care online for non-urgent symptoms. For details visit [email or web address].   Also download the MyChart app! Go to the app store, search "MyChart", open the app, select [facility], and log in with your MyChart username and password.  Due to Covid, a mask is required upon entering the hospital/clinic. If you do not have a mask, one will be given to you upon arrival. For doctor visits, patients may have 1 support person aged 7 or older with them. For treatment visits, patients cannot have anyone with them due to current Covid guidelines and our immunocompromised population.

## 2021-06-24 NOTE — Progress Notes (Signed)
Per Dr. Burr Medico, ok to treat with low ANC

## 2021-06-25 ENCOUNTER — Encounter (HOSPITAL_BASED_OUTPATIENT_CLINIC_OR_DEPARTMENT_OTHER): Payer: Self-pay | Admitting: General Surgery

## 2021-06-25 ENCOUNTER — Other Ambulatory Visit: Payer: Self-pay

## 2021-06-29 ENCOUNTER — Ambulatory Visit: Payer: BC Managed Care – PPO | Admitting: Neurology

## 2021-07-01 MED ORDER — ENSURE PRE-SURGERY PO LIQD: 296.0000 mL | Freq: Once | ORAL | Status: DC

## 2021-07-01 NOTE — Progress Notes (Signed)

## 2021-07-02 ENCOUNTER — Other Ambulatory Visit: Payer: BC Managed Care – PPO | Admitting: Licensed Clinical Social Worker

## 2021-07-06 ENCOUNTER — Other Ambulatory Visit: Payer: Self-pay

## 2021-07-06 ENCOUNTER — Inpatient Hospital Stay: Payer: BC Managed Care – PPO

## 2021-07-06 DIAGNOSIS — C50412 Malignant neoplasm of upper-outer quadrant of left female breast: Secondary | ICD-10-CM

## 2021-07-06 DIAGNOSIS — Z5111 Encounter for antineoplastic chemotherapy: Secondary | ICD-10-CM | POA: Diagnosis not present

## 2021-07-06 DIAGNOSIS — Z95828 Presence of other vascular implants and grafts: Secondary | ICD-10-CM

## 2021-07-06 LAB — CBC WITH DIFFERENTIAL (CANCER CENTER ONLY)
Abs Immature Granulocytes: 0.01 10*3/uL (ref 0.00–0.07)
Basophils Absolute: 0 10*3/uL (ref 0.0–0.1)
Basophils Relative: 1 %
Eosinophils Absolute: 0 10*3/uL (ref 0.0–0.5)
Eosinophils Relative: 1 %
HCT: 38.8 % (ref 36.0–46.0)
Hemoglobin: 13.3 g/dL (ref 12.0–15.0)
Immature Granulocytes: 1 %
Lymphocytes Relative: 37 %
Lymphs Abs: 0.8 10*3/uL (ref 0.7–4.0)
MCH: 30.5 pg (ref 26.0–34.0)
MCHC: 34.3 g/dL (ref 30.0–36.0)
MCV: 89 fL (ref 80.0–100.0)
Monocytes Absolute: 0.2 10*3/uL (ref 0.1–1.0)
Monocytes Relative: 10 %
Neutro Abs: 1.1 10*3/uL — ABNORMAL LOW (ref 1.7–7.7)
Neutrophils Relative %: 50 %
Platelet Count: 252 10*3/uL (ref 150–400)
RBC: 4.36 MIL/uL (ref 3.87–5.11)
RDW: 13.2 % (ref 11.5–15.5)
WBC Count: 2.2 10*3/uL — ABNORMAL LOW (ref 4.0–10.5)
nRBC: 0 % (ref 0.0–0.2)

## 2021-07-06 LAB — CMP (CANCER CENTER ONLY)
ALT: 15 U/L (ref 0–44)
AST: 15 U/L (ref 15–41)
Albumin: 4.5 g/dL (ref 3.5–5.0)
Alkaline Phosphatase: 32 U/L — ABNORMAL LOW (ref 38–126)
Anion gap: 6 (ref 5–15)
BUN: 20 mg/dL (ref 6–20)
CO2: 27 mmol/L (ref 22–32)
Calcium: 10 mg/dL (ref 8.9–10.3)
Chloride: 106 mmol/L (ref 98–111)
Creatinine: 0.61 mg/dL (ref 0.44–1.00)
GFR, Estimated: >60 mL/min (ref >60)
Glucose, Bld: 95 mg/dL (ref 70–99)
Potassium: 4 mmol/L (ref 3.5–5.1)
Sodium: 139 mmol/L (ref 135–145)
Total Bilirubin: 0.4 mg/dL (ref 0.3–1.2)
Total Protein: 7.1 g/dL (ref 6.5–8.1)

## 2021-07-06 MED ORDER — HEPARIN SOD (PORK) LOCK FLUSH 100 UNIT/ML IV SOLN
500.0000 [IU] | Freq: Once | INTRAVENOUS | Status: AC
  Administered 2021-07-06: 500 [IU]

## 2021-07-06 MED ORDER — SODIUM CHLORIDE 0.9% FLUSH
10.0000 mL | Freq: Once | INTRAVENOUS | Status: AC
  Administered 2021-07-06: 10 mL

## 2021-07-07 ENCOUNTER — Ambulatory Visit (HOSPITAL_BASED_OUTPATIENT_CLINIC_OR_DEPARTMENT_OTHER): Payer: BC Managed Care – PPO | Admitting: Anesthesiology

## 2021-07-07 ENCOUNTER — Encounter (HOSPITAL_BASED_OUTPATIENT_CLINIC_OR_DEPARTMENT_OTHER)
Admission: RE | Disposition: A | Discharge status: Home / Self Care | Payer: Self-pay | Source: Ambulatory Visit | Attending: General Surgery

## 2021-07-07 ENCOUNTER — Ambulatory Visit (HOSPITAL_BASED_OUTPATIENT_CLINIC_OR_DEPARTMENT_OTHER)
Admission: RE | Admit: 2021-07-07 | Discharge: 2021-07-07 | Disposition: A | Discharge status: Home / Self Care | Payer: BC Managed Care – PPO | Source: Ambulatory Visit | Attending: General Surgery | Admitting: General Surgery

## 2021-07-07 ENCOUNTER — Other Ambulatory Visit: Payer: Self-pay

## 2021-07-07 ENCOUNTER — Encounter (HOSPITAL_BASED_OUTPATIENT_CLINIC_OR_DEPARTMENT_OTHER): Payer: Self-pay | Admitting: General Surgery

## 2021-07-07 DIAGNOSIS — Z87891 Personal history of nicotine dependence: Secondary | ICD-10-CM | POA: Diagnosis not present

## 2021-07-07 DIAGNOSIS — C773 Secondary and unspecified malignant neoplasm of axilla and upper limb lymph nodes: Secondary | ICD-10-CM | POA: Insufficient documentation

## 2021-07-07 DIAGNOSIS — C50412 Malignant neoplasm of upper-outer quadrant of left female breast: Secondary | ICD-10-CM | POA: Diagnosis not present

## 2021-07-07 DIAGNOSIS — Z01818 Encounter for other preprocedural examination: Secondary | ICD-10-CM

## 2021-07-07 DIAGNOSIS — Z452 Encounter for adjustment and management of vascular access device: Secondary | ICD-10-CM | POA: Insufficient documentation

## 2021-07-07 DIAGNOSIS — F419 Anxiety disorder, unspecified: Secondary | ICD-10-CM | POA: Insufficient documentation

## 2021-07-07 HISTORY — DX: Other specified postprocedural states: Z98.890

## 2021-07-07 HISTORY — PX: AXILLARY LYMPH NODE DISSECTION: SHX5229

## 2021-07-07 HISTORY — PX: PORT-A-CATH REMOVAL: SHX5289

## 2021-07-07 LAB — POCT PREGNANCY, URINE: Preg Test, Ur: NEGATIVE

## 2021-07-07 SURGERY — LYMPHADENECTOMY, AXILLARY
Anesthesia: General | Site: Chest | Laterality: Right

## 2021-07-07 MED ORDER — ACETAMINOPHEN 500 MG PO TABS
1000.0000 mg | ORAL_TABLET | ORAL | Status: AC
  Administered 2021-07-07: 1000 mg via ORAL

## 2021-07-07 MED ORDER — FENTANYL CITRATE (PF) 100 MCG/2ML IJ SOLN
25.0000 ug | INTRAMUSCULAR | Status: DC | PRN
  Administered 2021-07-07 (×2): 50 ug via INTRAVENOUS

## 2021-07-07 MED ORDER — PROPOFOL 10 MG/ML IV BOLUS
INTRAVENOUS | Status: AC
  Filled 2021-07-07: qty 20

## 2021-07-07 MED ORDER — AMISULPRIDE (ANTIEMETIC) 5 MG/2ML IV SOLN
INTRAVENOUS | Status: AC
  Filled 2021-07-07: qty 2

## 2021-07-07 MED ORDER — EPHEDRINE SULFATE (PRESSORS) 50 MG/ML IJ SOLN
INTRAMUSCULAR | Status: DC | PRN
  Administered 2021-07-07 (×2): 10 mg via INTRAVENOUS

## 2021-07-07 MED ORDER — LIDOCAINE HCL 1 % IJ SOLN
INTRAMUSCULAR | Status: DC | PRN
  Administered 2021-07-07: 80 mg via INTRADERMAL

## 2021-07-07 MED ORDER — EPHEDRINE 5 MG/ML INJ
INTRAVENOUS | Status: AC
  Filled 2021-07-07: qty 5

## 2021-07-07 MED ORDER — LIDOCAINE-EPINEPHRINE 1 %-1:200000 IJ SOLN
INTRAMUSCULAR | Status: AC
Start: 2021-07-07 — End: ?
  Filled 2021-07-07: qty 30

## 2021-07-07 MED ORDER — CEFAZOLIN SODIUM-DEXTROSE 2-4 GM/100ML-% IV SOLN
2.0000 g | INTRAVENOUS | Status: AC
  Administered 2021-07-07: 2 g via INTRAVENOUS

## 2021-07-07 MED ORDER — DEXMEDETOMIDINE (PRECEDEX) IN NS 20 MCG/5ML (4 MCG/ML) IV SYRINGE
PREFILLED_SYRINGE | INTRAVENOUS | Status: DC | PRN
  Administered 2021-07-07: 8 ug via INTRAVENOUS

## 2021-07-07 MED ORDER — MIDAZOLAM HCL 2 MG/2ML IJ SOLN
INTRAMUSCULAR | Status: AC
  Filled 2021-07-07: qty 2

## 2021-07-07 MED ORDER — SCOPOLAMINE 1 MG/3DAYS TD PT72
MEDICATED_PATCH | TRANSDERMAL | Status: AC
Start: 2021-07-07 — End: ?
  Filled 2021-07-07: qty 1

## 2021-07-07 MED ORDER — OXYCODONE HCL 5 MG PO TABS
5.0000 mg | ORAL_TABLET | Freq: Once | ORAL | Status: AC | PRN
  Administered 2021-07-07: 5 mg via ORAL

## 2021-07-07 MED ORDER — GABAPENTIN 100 MG PO CAPS
200.0000 mg | ORAL_CAPSULE | ORAL | Status: AC
  Administered 2021-07-07: 200 mg via ORAL

## 2021-07-07 MED ORDER — GABAPENTIN 100 MG PO CAPS
ORAL_CAPSULE | ORAL | Status: AC
  Filled 2021-07-07: qty 2

## 2021-07-07 MED ORDER — AMISULPRIDE (ANTIEMETIC) 5 MG/2ML IV SOLN
10.0000 mg | Freq: Once | INTRAVENOUS | Status: AC | PRN
  Administered 2021-07-07: 10 mg via INTRAVENOUS

## 2021-07-07 MED ORDER — BUPIVACAINE LIPOSOME 1.3 % IJ SUSP
INTRAMUSCULAR | Status: DC | PRN
  Administered 2021-07-07: 10 mL

## 2021-07-07 MED ORDER — CEFAZOLIN SODIUM-DEXTROSE 2-4 GM/100ML-% IV SOLN
INTRAVENOUS | Status: AC
  Filled 2021-07-07: qty 100

## 2021-07-07 MED ORDER — FENTANYL CITRATE (PF) 100 MCG/2ML IJ SOLN
INTRAMUSCULAR | Status: DC | PRN
  Administered 2021-07-07: 50 ug via INTRAVENOUS
  Administered 2021-07-07 (×2): 25 ug via INTRAVENOUS

## 2021-07-07 MED ORDER — FENTANYL CITRATE (PF) 100 MCG/2ML IJ SOLN
INTRAMUSCULAR | Status: AC
  Filled 2021-07-07: qty 2

## 2021-07-07 MED ORDER — DEXAMETHASONE SODIUM PHOSPHATE 10 MG/ML IJ SOLN
INTRAMUSCULAR | Status: DC | PRN
  Administered 2021-07-07: 8 mg via INTRAVENOUS

## 2021-07-07 MED ORDER — DEXAMETHASONE SODIUM PHOSPHATE 10 MG/ML IJ SOLN
INTRAMUSCULAR | Status: AC
  Filled 2021-07-07: qty 1

## 2021-07-07 MED ORDER — OXYCODONE HCL 5 MG/5ML PO SOLN: 5.0000 mg | Freq: Once | ORAL | Status: AC | PRN

## 2021-07-07 MED ORDER — MIDAZOLAM HCL 2 MG/2ML IJ SOLN
2.0000 mg | Freq: Once | INTRAMUSCULAR | Status: AC
Start: 2021-07-07 — End: 2021-07-07
  Administered 2021-07-07: 2 mg via INTRAVENOUS

## 2021-07-07 MED ORDER — ACETAMINOPHEN 500 MG PO TABS
ORAL_TABLET | ORAL | Status: AC
Start: 2021-07-07 — End: ?
  Filled 2021-07-07: qty 2

## 2021-07-07 MED ORDER — LIDOCAINE 2% (20 MG/ML) 5 ML SYRINGE
INTRAMUSCULAR | Status: AC
  Filled 2021-07-07: qty 5

## 2021-07-07 MED ORDER — PROPOFOL 10 MG/ML IV BOLUS
INTRAVENOUS | Status: DC | PRN
  Administered 2021-07-07: 150 mg via INTRAVENOUS

## 2021-07-07 MED ORDER — FENTANYL CITRATE (PF) 100 MCG/2ML IJ SOLN
100.0000 ug | Freq: Once | INTRAMUSCULAR | Status: AC
  Administered 2021-07-07: 50 ug via INTRAVENOUS

## 2021-07-07 MED ORDER — LACTATED RINGERS IV SOLN: INTRAVENOUS | Status: DC

## 2021-07-07 MED ORDER — BUPIVACAINE HCL (PF) 0.25 % IJ SOLN
INTRAMUSCULAR | Status: DC | PRN
  Administered 2021-07-07: 7 mL

## 2021-07-07 MED ORDER — BUPIVACAINE HCL (PF) 0.25 % IJ SOLN
INTRAMUSCULAR | Status: AC
  Filled 2021-07-07: qty 30

## 2021-07-07 MED ORDER — SCOPOLAMINE 1 MG/3DAYS TD PT72
1.0000 | MEDICATED_PATCH | TRANSDERMAL | Status: DC
Start: 2021-07-07 — End: 2021-07-07
  Administered 2021-07-07: 1.5 mg via TRANSDERMAL

## 2021-07-07 MED ORDER — OXYCODONE HCL 5 MG PO TABS
ORAL_TABLET | ORAL | Status: AC
  Filled 2021-07-07: qty 1

## 2021-07-07 MED ORDER — ONDANSETRON HCL 4 MG/2ML IJ SOLN
INTRAMUSCULAR | Status: DC | PRN
  Administered 2021-07-07: 4 mg via INTRAVENOUS

## 2021-07-07 MED ORDER — CHLORHEXIDINE GLUCONATE CLOTH 2 % EX PADS: 6.0000 | MEDICATED_PAD | Freq: Once | CUTANEOUS | Status: DC

## 2021-07-07 MED ORDER — ONDANSETRON HCL 4 MG/2ML IJ SOLN: 4.0000 mg | Freq: Once | INTRAMUSCULAR | Status: DC | PRN

## 2021-07-07 MED ORDER — DEXMEDETOMIDINE (PRECEDEX) IN NS 20 MCG/5ML (4 MCG/ML) IV SYRINGE
PREFILLED_SYRINGE | INTRAVENOUS | Status: AC
  Filled 2021-07-07: qty 5

## 2021-07-07 MED ORDER — MIDAZOLAM HCL 5 MG/5ML IJ SOLN
INTRAMUSCULAR | Status: DC | PRN
  Administered 2021-07-07 (×2): 1 mg via INTRAVENOUS

## 2021-07-07 MED ORDER — CHLORHEXIDINE GLUCONATE CLOTH 2 % EX PADS
6.0000 | MEDICATED_PAD | Freq: Once | CUTANEOUS | Status: DC
Start: 2021-07-07 — End: 2021-07-07

## 2021-07-07 MED ORDER — BUPIVACAINE HCL (PF) 0.5 % IJ SOLN
INTRAMUSCULAR | Status: DC | PRN
  Administered 2021-07-07: 20 mL

## 2021-07-07 MED ORDER — 0.9 % SODIUM CHLORIDE (POUR BTL) OPTIME
TOPICAL | Status: DC | PRN
  Administered 2021-07-07: 1000 mL

## 2021-07-07 MED ORDER — ONDANSETRON HCL 4 MG/2ML IJ SOLN
INTRAMUSCULAR | Status: AC
Start: 2021-07-07 — End: ?
  Filled 2021-07-07: qty 2

## 2021-07-07 SURGICAL SUPPLY — 65 items
ADH SKN CLS APL DERMABOND .7 (GAUZE/BANDAGES/DRESSINGS) ×2
APL PRP STRL LF DISP 70% ISPRP (MISCELLANEOUS) ×2
APPLIER CLIP 11 MED OPEN (CLIP)
APR CLP MED 11 20 MLT OPN (CLIP)
BLADE CLIPPER SURG (BLADE) ×2 IMPLANT
BLADE HEX COATED 2.75 (ELECTRODE) ×3 IMPLANT
BLADE SURG 10 STRL SS (BLADE) ×4 IMPLANT
BLADE SURG 15 STRL LF DISP TIS (BLADE) ×2 IMPLANT
BLADE SURG 15 STRL SS (BLADE)
BNDG COHESIVE 4X5 TAN ST LF (GAUZE/BANDAGES/DRESSINGS) ×3 IMPLANT
BNDG ELASTIC 6X5.8 VLCR STR LF (GAUZE/BANDAGES/DRESSINGS) IMPLANT
BNDG GAUZE ELAST 4 BULKY (GAUZE/BANDAGES/DRESSINGS) IMPLANT
CANISTER SUCT 1200ML W/VALVE (MISCELLANEOUS) ×3 IMPLANT
CHLORAPREP W/TINT 26 (MISCELLANEOUS) ×3 IMPLANT
CLIP APPLIE 11 MED OPEN (CLIP) IMPLANT
CLIP TI LARGE 6 (CLIP) ×3 IMPLANT
CLIP TI MEDIUM 6 (CLIP) ×4 IMPLANT
CLIP TI WIDE RED SMALL 6 (CLIP) IMPLANT
COVER BACK TABLE 60X90IN (DRAPES) ×2 IMPLANT
COVER MAYO STAND STRL (DRAPES) ×3 IMPLANT
DERMABOND ADVANCED (GAUZE/BANDAGES/DRESSINGS) ×1
DERMABOND ADVANCED .7 DNX12 (GAUZE/BANDAGES/DRESSINGS) ×2 IMPLANT
DRAIN CHANNEL 19F RND (DRAIN) ×3 IMPLANT
DRAPE LAPAROTOMY 100X72 PEDS (DRAPES) ×2 IMPLANT
DRAPE UTILITY XL STRL (DRAPES) ×5 IMPLANT
ELECT REM PT RETURN 9FT ADLT (ELECTROSURGICAL) ×3
ELECTRODE REM PT RTRN 9FT ADLT (ELECTROSURGICAL) ×2 IMPLANT
EVACUATOR SILICONE 100CC (DRAIN) ×3 IMPLANT
GAUZE SPONGE 4X4 12PLY STRL (GAUZE/BANDAGES/DRESSINGS) ×3 IMPLANT
GLOVE BIO SURGEON STRL SZ 6 (GLOVE) ×3 IMPLANT
GLOVE BIOGEL PI IND STRL 6.5 (GLOVE) ×2 IMPLANT
GLOVE BIOGEL PI INDICATOR 6.5 (GLOVE) ×1
GOWN STRL REUS W/ TWL LRG LVL3 (GOWN DISPOSABLE) ×2 IMPLANT
GOWN STRL REUS W/TWL 2XL LVL3 (GOWN DISPOSABLE) ×3 IMPLANT
GOWN STRL REUS W/TWL LRG LVL3 (GOWN DISPOSABLE) ×3
LIGHT WAVEGUIDE WIDE FLAT (MISCELLANEOUS) ×1 IMPLANT
NDL HYPO 25X1 1.5 SAFETY (NEEDLE) ×2 IMPLANT
NEEDLE HYPO 25X1 1.5 SAFETY (NEEDLE) ×3 IMPLANT
NS IRRIG 1000ML POUR BTL (IV SOLUTION) ×3 IMPLANT
PACK BASIN DAY SURGERY FS (CUSTOM PROCEDURE TRAY) ×3 IMPLANT
PACK UNIVERSAL I (CUSTOM PROCEDURE TRAY) ×3 IMPLANT
PENCIL SMOKE EVACUATOR (MISCELLANEOUS) ×3 IMPLANT
PIN SAFETY STERILE (MISCELLANEOUS) ×3 IMPLANT
SLEEVE SCD COMPRESS KNEE MED (STOCKING) ×3 IMPLANT
SPIKE FLUID TRANSFER (MISCELLANEOUS) IMPLANT
SPONGE T-LAP 18X18 ~~LOC~~+RFID (SPONGE) ×3 IMPLANT
STAPLER VISISTAT 35W (STAPLE) ×2 IMPLANT
STOCKINETTE 6  STRL (DRAPES)
STOCKINETTE 6 STRL (DRAPES) IMPLANT
STOCKINETTE IMPERVIOUS LG (DRAPES) ×3 IMPLANT
STRIP CLOSURE SKIN 1/2X4 (GAUZE/BANDAGES/DRESSINGS) ×3 IMPLANT
SUT ETHILON 2 0 FS 18 (SUTURE) ×3 IMPLANT
SUT MNCRL AB 4-0 PS2 18 (SUTURE) ×3 IMPLANT
SUT MON AB 4-0 PC3 18 (SUTURE) ×3 IMPLANT
SUT SILK 2 0 SH (SUTURE) ×2 IMPLANT
SUT VIC AB 3-0 54X BRD REEL (SUTURE) IMPLANT
SUT VIC AB 3-0 BRD 54 (SUTURE)
SUT VIC AB 3-0 SH 27 (SUTURE)
SUT VIC AB 3-0 SH 27X BRD (SUTURE) ×2 IMPLANT
SUT VICRYL 3-0 CR8 SH (SUTURE) ×3 IMPLANT
SYR BULB EAR ULCER 3OZ GRN STR (SYRINGE) IMPLANT
SYR CONTROL 10ML LL (SYRINGE) ×3 IMPLANT
TOWEL GREEN STERILE FF (TOWEL DISPOSABLE) ×5 IMPLANT
TUBE CONNECTING 20X1/4 (TUBING) ×3 IMPLANT
YANKAUER SUCT BULB TIP NO VENT (SUCTIONS) ×3 IMPLANT

## 2021-07-07 NOTE — H&P (Signed)
PROVIDER: Georgianne Fick, MD  MRN: [id number] DOB: 1981-02-06 DATE OF ENCOUNTER: 06/15/2021 Subjective   Chief Complaint: Discuss Axillary Lymph Node Dissection  History of Present Illness: Pamela Reyes is a 41 y.o. female who is seen today for follow up of left breast cancer   Initial History:  Pt is a lovely 41 yo F who presented with a new diagnosis of left breast cancer 10/2020. She had a palpable mass that was self identified.  She had a benign biopsy in 2017 that she thinks was the same area.  The mass started to enlarge.  She underwent diagnostic imaging and was seen to have  4.5 cm mass at 12 o'clock with an abnormal axillary node.  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  She is nulliparous and had menarche at age 67.  She has used hormonal contraception for around 11 years total.  She has a sister, paternal grandmother and paternal aunt with breast cancer.  Maternal uncle had bladder cancer.     She underwent left mastectomy with expander (thimmappa) 01/07/21 and seed targeted node with SLN bx. 4/5 nodes were positive. Margins were negative.   Interval history: She has been getting chemo and tolerating it relatively well. She hasn't had any delays. She has one dose left. We are going to do ALND and then she will need XRT. She has full ROM now after her prior surgeries.   Review of Systems: A complete review of systems was obtained from the patient. I have reviewed this information and discussed as appropriate with the patient. See HPI as well for other ROS.  Review of Systems  All other systems reviewed and are negative.   Medical History: Past Medical History:  Diagnosis Date   Anxiety   Fibroid 10/27/2020   Hyperlipidemia   Peptic ulcer  Date unknown   Patient Active Problem List  Diagnosis   Left breast mass   Breast pain, left   Malignant neoplasm of upper-outer quadrant of left breast in female, estrogen  receptor positive (CMS-HCC)   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 lymph node, left (CMS-HCC)   Family history of breast cancer   Dense breasts   Axillary pain, right   Past Surgical History:  Procedure Laterality Date   Left Mastectomy with Magtrace 01/06/2021  Dr. Barry Dienes   wisdom teeth extraction N/A  Unknown Date    Allergies  Allergen Reactions   Prochlorperazine Unknown  Severe restlessness. Akathisia.   Current Outpatient Medications on File Prior to Visit  Medication Sig Dispense Refill   lidocaine-prilocaine (EMLA) cream Apply to affected area once   tretinoin (RETIN-A) 0.025 % cream   acetaminophen (TYLENOL) 500 MG tablet Take by mouth   cholecalciferol (VITAMIN D3) 5,000 unit capsule Take by mouth   diphenhydrAMINE (BENADRYL) 25 mg tablet Take by mouth   famotidine (PEPCID) 10 MG tablet Take by mouth   loratadine (CLARITIN) 10 mg tablet Take 1 tablet by mouth once daily   norethindrone-ethinyl estradiol-iron (JUNEL FE 1.5/30) 1.5 mg-30 mcg (21)/75 mg (7) tablet Take 1 tablet by mouth once daily   rosuvastatin (CRESTOR) 10 MG tablet Take 1 tablet by mouth once daily   No current facility-administered medications on file prior to visit.   Family History  Problem Relation Age of Onset   Heart failure Father   Breast cancer Sister   Breast cancer Maternal Aunt   Stroke Maternal Grandmother   Lung cancer Maternal Grandfather   Breast  cancer Paternal Grandmother    Social History   Tobacco Use  Smoking Status Former   Types: Cigarettes  Smokeless Tobacco Never  Tobacco Comments  Teen years, mildly smoked cigarettes    Social History   Socioeconomic History   Marital status: Unknown  Tobacco Use   Smoking status: Former  Types: Cigarettes   Smokeless tobacco: Never   Tobacco comments:  Teen years, mildly smoked cigarettes  Vaping Use   Vaping Use: Never used  Substance and Sexual Activity   Alcohol use: Yes  Comment: 2-3 drinks 3-4 nights a week    Drug use: Never   Objective:   There were no vitals filed for this visit.  There is no height or weight on file to calculate BMI.  Head: Normocephalic and atraumatic.  Eyes: Conjunctivae are normal. Pupils are equal, round, and reactive to light. No scleral icterus.  Neck: Normal range of motion. Neck supple. No tracheal deviation present. No thyromegaly present.  Resp: No respiratory distress, normal effort. Breast: full ROM left arm. No palpable adenopathy. No masses.  Abd: Abdomen is soft, non distended and non tender. No masses are palpable. There is no rebound and no guarding.  Neurological: Alert and oriented to person, place, and time. Coordination normal.  Skin: Skin is warm and dry. No rash noted. No diaphoretic. No erythema. No pallor.  Psychiatric: Normal mood and affect. Normal behavior. Judgment and thought content normal.   Labs, Imaging and Diagnostic Testing:  CMET and CBC 06/10/21 essentially normal.   Assessment and Plan:   Diagnoses and all orders for this visit:  Malignant neoplasm of upper-outer quadrant of left breast in female, estrogen receptor positive (CMS-HCC)  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 lymph node, left (CMS-HCC)  After much consideration, mutual decision has been made to perform axillary lymph node dissection on the left. She has 1 more dose of chemotherapy. We will tentatively plan surgery 2 weeks after she has completed chemo.  She is scheduled for a repeat PET scan tomorrow. I discussed that if she has distant metastatic disease, we would not plan to do an axillary lymph node dissection as it would not help her.  From the notes it sounds like she would have implant exchange and then have radiation. I will message Dr. Iran Planas to see if she would want to do that at the same time or not.  Discussed the procedure with the patient and how axillary lymph node dissection is the surgery based on boundaries. I discussed the risk of nerve damage  and motor defects that occur from that. I discussed the risk of an extended zone of numbness behind her arm or chest wall. I also discussed the risk of bleeding and infection. I reviewed that she would have a drain again. We discussed postoperative restrictions. I have messaged physical therapy regarding whether or not she needs a preoperative appointment or just postop after the drains are out.  No follow-ups on file.  Georgianne Fick, MD

## 2021-07-07 NOTE — Transfer of Care (Signed)
Immediate Anesthesia Transfer of Care Note  Patient: Pamela Reyes  Procedure(s) Performed: LEFT AXILLARY LYMPH NODE DISSECTION (Left: Axilla) REMOVAL PORT-A-CATH (Right: Chest)  Patient Location: PACU  Anesthesia Type:GA combined with regional for post-op pain  Level of Consciousness: sedated  Airway & Oxygen Therapy: Patient Spontanous Breathing and Patient connected to face mask oxygen  Post-op Assessment: Report given to RN and Post -op Vital signs reviewed and stable  Post vital signs: Reviewed and stable  Last Vitals:  Vitals Value Taken Time  BP    Temp    Pulse 103 07/07/21 1523  Resp 14 07/07/21 1523  SpO2 100 % 07/07/21 1523  Vitals shown include unvalidated device data.  Last Pain:  Vitals:   07/07/21 1128  TempSrc: Oral  PainSc: 3          Complications: No notable events documented.

## 2021-07-07 NOTE — Anesthesia Postprocedure Evaluation (Signed)
Anesthesia Post Note  Patient: Pamela Reyes  Procedure(s) Performed: LEFT AXILLARY LYMPH NODE DISSECTION (Left: Axilla) REMOVAL PORT-A-CATH (Right: Chest)     Patient location during evaluation: PACU Anesthesia Type: General Level of consciousness: awake Pain management: pain level controlled Vital Signs Assessment: post-procedure vital signs reviewed and stable Respiratory status: spontaneous breathing and respiratory function stable Cardiovascular status: stable Postop Assessment: no apparent nausea or vomiting Anesthetic complications: no   No notable events documented.  Last Vitals:  Vitals:   07/07/21 1530 07/07/21 1545  BP: 118/71   Pulse: 90 85  Resp: 15 11  Temp:    SpO2: 100% 100%    Last Pain:  Vitals:   07/07/21 1545  TempSrc:   PainSc: 5                  Candra R Clever Geraldo

## 2021-07-07 NOTE — Progress Notes (Signed)
Assisted Dr. Bass with left, pectoralis, ultrasound guided block. Side rails up, monitors on throughout procedure. See vital signs in flow sheet. Tolerated Procedure well. 

## 2021-07-07 NOTE — Anesthesia Preprocedure Evaluation (Addendum)
Anesthesia Evaluation  Patient identified by MRN, date of birth, ID band Patient awake    Reviewed: Allergy & Precautions, NPO status , Patient's Chart, lab work & pertinent test results  History of Anesthesia Complications (+) PONV and history of anesthetic complications  Airway Mallampati: II  TM Distance: >3 FB Neck ROM: Full    Dental no notable dental hx.    Pulmonary neg pulmonary ROS, former smoker,    Pulmonary exam normal breath sounds clear to auscultation       Cardiovascular Exercise Tolerance: Good negative cardio ROS Normal cardiovascular exam Rhythm:Regular Rate:Normal     Neuro/Psych PSYCHIATRIC DISORDERS Anxiety negative neurological ROS     GI/Hepatic Neg liver ROS, hiatal hernia,   Endo/Other  negative endocrine ROS  Renal/GU negative Renal ROS  negative genitourinary   Musculoskeletal negative musculoskeletal ROS (+)   Abdominal   Peds negative pediatric ROS (+)  Hematology negative hematology ROS (+)   Anesthesia Other Findings Breast cancer  Reproductive/Obstetrics negative OB ROS                            Anesthesia Physical Anesthesia Plan  ASA: 2  Anesthesia Plan: General   Post-op Pain Management:    Induction: Intravenous  PONV Risk Score and Plan: 3 and Treatment may vary due to age or medical condition, Midazolam, Scopolamine patch - Pre-op, Ondansetron and Dexamethasone  Airway Management Planned: LMA  Additional Equipment: None  Intra-op Plan:   Post-operative Plan: Extubation in OR  Informed Consent: I have reviewed the patients History and Physical, chart, labs and discussed the procedure including the risks, benefits and alternatives for the proposed anesthesia with the patient or authorized representative who has indicated his/her understanding and acceptance.     Dental advisory given  Plan Discussed with: CRNA and  Anesthesiologist  Anesthesia Plan Comments:        Anesthesia Quick Evaluation

## 2021-07-07 NOTE — Anesthesia Procedure Notes (Signed)
Anesthesia Regional Block: Pectoralis block   Pre-Anesthetic Checklist: , timeout performed,  Correct Patient, Correct Site, Correct Laterality,  Correct Procedure, Correct Position, site marked,  Risks and benefits discussed,  Surgical consent,  Pre-op evaluation,  At surgeon's request and post-op pain management  Laterality: Left  Prep: chloraprep       Needles:  Injection technique: Single-shot  Needle Type: Echogenic Stimulator Needle     Needle Length: 10cm  Needle Gauge: 20     Additional Needles:   Procedures:,,,, ultrasound used (permanent image in chart),,    Narrative:  Start time: 07/07/2021 11:45 AM End time: 07/07/2021 11:50 AM Injection made incrementally with aspirations every 5 mL.  Performed by: Personally  Anesthesiologist: Merlinda [facility], MD  Additional Notes: Functioning IV was confirmed and monitors were applied.  Sterile prep and drape,hand hygiene and sterile gloves were used. Ultrasound guidance: relevant anatomy identified, needle position confirmed, local anesthetic spread visualized around nerve(s)., vascular puncture avoided. Negative aspiration and negative test dose prior to incremental administration of local anesthetic. The patient tolerated the procedure well.

## 2021-07-07 NOTE — Anesthesia Procedure Notes (Signed)
Date/Time: 07/07/2021 1:48 PM Performed by: Garrel Ridgel, CRNA Pre-anesthesia Checklist: Patient identified, Emergency Drugs available, Suction available and Patient being monitored Oxygen Delivery Method: Circle system utilized Preoxygenation: Pre-oxygenation with 100% oxygen Induction Type: IV induction Ventilation: Mask ventilation without difficulty LMA Size: 4.0 Placement Confirmation: positive ETCO2 Tube secured with: Tape Dental Injury: Teeth and Oropharynx as per pre-operative assessment

## 2021-07-07 NOTE — Op Note (Signed)
  PRE-OPERATIVE DIAGNOSIS:   left breast cancer, s/p left mastectomy with reconstruction and SLN bx with 4/4 positive nodes.  Un-needed Port-A-Cath for left breast cancer  POST-OPERATIVE DIAGNOSIS:  Same   PROCEDURE:  Procedure(s):  Left axillary lymph node dissection, removal right subclavian PORT-A-CATH  SURGEON:  Surgeon(s):  Stark Klein, MD  ANESTHESIA:   General + local  EBL:   Minimal  SPECIMEN:  None  Complications : none known  Procedure:   Pt was  identified in the holding area and taken to the operating room where she was placed supine on the operating room table.  General anesthesia was induced.  The bilateral upper chest and left arm were prepped and draped.  A time out was performed according to the surgical safety check list.  When all was correct, we continued.    The port was addressed first.  The right sided prior incision was anesthetized with local anesthetic.  The incision was opened with a #15 blade.  The subcutaneous tissue was divided with the cautery.  The port was identified and the capsule opened.  The four 2-0 prolene sutures were removed.  The port was then removed and pressure held on the tract.  The catheter appeared intact without evidence of breakage, length was 19 cm.  The wound was inspected for hemostasis, which was achieved with cautery.  The wound was closed with 3-0 vicryl deep dermal interrupted sutures and 4-0 Monocryl running subcuticular suture.  The wound was cleaned, dried, and dressed with dermabond.    An curvalinear incision was made in the left axilla. An axillary dissection was performed with removal of the associated lymph nodes and surrounding adipose tissue. This included levels I and II.  The axillary vein was exposed anteriorly and inferiorly to the level of the pectoralis minor and laterally over the latissimus dorsi muscle. Posteriorly, the dissection continued to the subscapularis.  The long thoracic and thoracodorsal neurovascular  bundles were identified early prior to division of small lymphovascular channels and preserved. Small venous tributaries, lymphatics, and vessels were clipped and ligated or cauterized and divided. The subscapularis muscle was skeletonized. Rotter's nodes were taken between the pectoralis minor and major.    A 19 Fr blake drain was secured to the left chest wall with a 2-0 nylon.  The wound was irrigated and closed with a 3-0 Vicryl deep dermal interrupted and a 4-0 vicryl subcuticular closure in layers. The wound was dressed with dermabond and the drain site with a biopatch and a tegaderm.    The patient was awakened from anesthesia and taken to the PACU in stable condition.  Needle, sponge, and instrument counts are correct.

## 2021-07-07 NOTE — Discharge Instructions (Addendum)
[facility] Phone Number [phone]   POST OP INSTRUCTIONS  Always review your discharge instruction sheet given to you by the facility where your surgery was performed.  IF YOU HAVE DISABILITY OR FAMILY LEAVE FORMS, YOU MUST BRING THEM TO THE OFFICE FOR PROCESSING.  DO NOT GIVE THEM TO YOUR DOCTOR.  Take 2 tylenol (acetominophen) three times a day for 3 days.  If you still have pain, add ibuprofen with food in between if able to take this (if you have kidney issues or stomach issues, do not take ibuprofen).  If both of those are not enough, add the narcotic pain pill.  If you find you are needing a lot of this overnight after surgery, call the next morning for a refill.   Take your usually prescribed medications unless otherwise directed If you need a refill on your pain medication, please contact your pharmacy.  They will contact our office to request authorization.  Prescriptions will not be filled after 5pm or on week-ends. You should eat very light the first 24 hours after surgery, such as soup, crackers, pudding, etc.  Resume your normal diet the day after surgery It is common to experience some constipation if taking pain medication after surgery.  Increasing fluid intake and taking a stool softener will usually help or prevent this problem from occurring.  A mild laxative (Milk of Magnesia or Miralax) should be taken according to package directions if there are no bowel movements after 48 hours. You may shower in 48 hours.  The surgical glue will flake off in 2-3 weeks.   ACTIVITIES:  No strenuous activity or heavy lifting for 2 weeks.   You may drive when you no longer are taking prescription pain medication, you can comfortably wear a seatbelt, and you can safely maneuver your car and apply brakes. RETURN TO WORK:  __________to be determined. _______________ Dennis Bast should see your doctor in the office for a follow-up appointment approximately three-four weeks after  your surgery.    WHEN TO CALL YOUR DOCTOR: Fever over 101.0 Nausea and/or vomiting. Extreme swelling or bruising. Continued bleeding from incision. Increased pain, redness, or drainage from the incision.  The clinic staff is available to answer your questions during regular business hours.  Please don't hesitate to call and ask to speak to one of the nurses for clinical concerns.  If you have a medical emergency, go to the nearest emergency room or call 911.  A surgeon from [facility] is always on call at the hospital.  For further questions, please visit [email or web address]    Post Anesthesia Home Care Instructions  Activity: Get plenty of rest for the remainder of the day. A responsible individual must stay with you for 24 hours following the procedure.  For the next 24 hours, DO NOT: -Drive a car -Paediatric nurse -Drink alcoholic beverages -Take any medication unless instructed by your physician -Make any legal decisions or sign important papers.  Meals: Start with liquid foods such as gelatin or soup. Progress to regular foods as tolerated. Avoid greasy, spicy, heavy foods. If nausea and/or vomiting occur, drink only clear liquids until the nausea and/or vomiting subsides. Call your physician if vomiting continues.  Special Instructions/Symptoms: Your throat may feel dry or sore from the anesthesia or the breathing tube placed in your throat during surgery. If this causes discomfort, gargle with warm salt water. The discomfort should disappear within 24 hours.  If you had a scopolamine patch placed behind your ear for  the management of post- operative nausea and/or vomiting:  1. The medication in the patch is effective for 72 hours, after which it should be removed.  Wrap patch in a tissue and discard in the trash. Wash hands thoroughly with soap and water. 2. You may remove the patch earlier than 72 hours if you experience unpleasant side effects which  may include dry mouth, dizziness or visual disturbances. 3. Avoid touching the patch. Wash your hands with soap and water after contact with the patch.

## 2021-07-08 ENCOUNTER — Encounter (HOSPITAL_BASED_OUTPATIENT_CLINIC_OR_DEPARTMENT_OTHER): Payer: Self-pay | Admitting: General Surgery

## 2021-07-12 ENCOUNTER — Ambulatory Visit: Payer: BC Managed Care – PPO | Admitting: Rehabilitation

## 2021-07-13 ENCOUNTER — Telehealth: Payer: Self-pay | Admitting: Radiation Oncology

## 2021-07-13 ENCOUNTER — Encounter: Payer: Self-pay | Admitting: *Deleted

## 2021-07-13 DIAGNOSIS — C50412 Malignant neoplasm of upper-outer quadrant of left female breast: Secondary | ICD-10-CM

## 2021-07-13 LAB — SURGICAL PATHOLOGY

## 2021-07-13 NOTE — Telephone Encounter (Signed)
LVM to schedule consult with Dr. Moody ?

## 2021-07-19 ENCOUNTER — Telehealth: Payer: Self-pay | Admitting: Radiation Oncology

## 2021-07-19 ENCOUNTER — Encounter: Payer: Self-pay | Admitting: Radiation Oncology

## 2021-07-19 NOTE — Telephone Encounter (Signed)
I spoke with the patient by phone and she is in agreement with plans to proceed with radiation. We reviewed her pathology and that she has had her JP drain removed. We will proceed with simulation on 08/02/21, and start treatment after July 4th. She will keep [place] informed of any questions or concerns prior to that visit.

## 2021-07-20 ENCOUNTER — Other Ambulatory Visit: Payer: Self-pay

## 2021-07-20 DIAGNOSIS — Z170 Estrogen receptor positive status [ER+]: Secondary | ICD-10-CM

## 2021-07-21 ENCOUNTER — Inpatient Hospital Stay: Payer: BC Managed Care – PPO | Attending: Hematology

## 2021-07-21 ENCOUNTER — Other Ambulatory Visit: Payer: Self-pay

## 2021-07-21 ENCOUNTER — Inpatient Hospital Stay: Payer: BC Managed Care – PPO

## 2021-07-21 ENCOUNTER — Inpatient Hospital Stay (HOSPITAL_BASED_OUTPATIENT_CLINIC_OR_DEPARTMENT_OTHER): Payer: BC Managed Care – PPO | Admitting: Physician Assistant

## 2021-07-21 VITALS — BP 115/83 | HR 81 | Temp 97.7°F | Resp 18 | Ht 64.0 in | Wt 135.5 lb

## 2021-07-21 DIAGNOSIS — K5900 Constipation, unspecified: Secondary | ICD-10-CM | POA: Insufficient documentation

## 2021-07-21 DIAGNOSIS — Z803 Family history of malignant neoplasm of breast: Secondary | ICD-10-CM | POA: Insufficient documentation

## 2021-07-21 DIAGNOSIS — F419 Anxiety disorder, unspecified: Secondary | ICD-10-CM | POA: Diagnosis not present

## 2021-07-21 DIAGNOSIS — N898 Other specified noninflammatory disorders of vagina: Secondary | ICD-10-CM

## 2021-07-21 DIAGNOSIS — G629 Polyneuropathy, unspecified: Secondary | ICD-10-CM | POA: Insufficient documentation

## 2021-07-21 DIAGNOSIS — L292 Pruritus vulvae: Secondary | ICD-10-CM | POA: Diagnosis not present

## 2021-07-21 DIAGNOSIS — C50412 Malignant neoplasm of upper-outer quadrant of left female breast: Secondary | ICD-10-CM | POA: Diagnosis present

## 2021-07-21 DIAGNOSIS — Z170 Estrogen receptor positive status [ER+]: Secondary | ICD-10-CM | POA: Insufficient documentation

## 2021-07-21 DIAGNOSIS — K5909 Other constipation: Secondary | ICD-10-CM

## 2021-07-21 DIAGNOSIS — Z5111 Encounter for antineoplastic chemotherapy: Secondary | ICD-10-CM | POA: Diagnosis present

## 2021-07-21 DIAGNOSIS — Z95828 Presence of other vascular implants and grafts: Secondary | ICD-10-CM

## 2021-07-21 DIAGNOSIS — C773 Secondary and unspecified malignant neoplasm of axilla and upper limb lymph nodes: Secondary | ICD-10-CM | POA: Diagnosis not present

## 2021-07-21 LAB — CMP (CANCER CENTER ONLY)
ALT: 19 U/L (ref 0–44)
AST: 18 U/L (ref 15–41)
Albumin: 4.7 g/dL (ref 3.5–5.0)
Alkaline Phosphatase: 34 U/L — ABNORMAL LOW (ref 38–126)
Anion gap: 9 (ref 5–15)
BUN: 16 mg/dL (ref 6–20)
CO2: 27 mmol/L (ref 22–32)
Calcium: 10.4 mg/dL — ABNORMAL HIGH (ref 8.9–10.3)
Chloride: 104 mmol/L (ref 98–111)
Creatinine: 0.71 mg/dL (ref 0.44–1.00)
GFR, Estimated: >60 mL/min (ref >60)
Glucose, Bld: 101 mg/dL — ABNORMAL HIGH (ref 70–99)
Potassium: 3.9 mmol/L (ref 3.5–5.1)
Sodium: 140 mmol/L (ref 135–145)
Total Bilirubin: 0.4 mg/dL (ref 0.3–1.2)
Total Protein: 7.6 g/dL (ref 6.5–8.1)

## 2021-07-21 LAB — CBC WITH DIFFERENTIAL (CANCER CENTER ONLY)
Abs Immature Granulocytes: 0 10*3/uL (ref 0.00–0.07)
Basophils Absolute: 0 10*3/uL (ref 0.0–0.1)
Basophils Relative: 1 %
Eosinophils Absolute: 0.1 10*3/uL (ref 0.0–0.5)
Eosinophils Relative: 1 %
HCT: 40.5 % (ref 36.0–46.0)
Hemoglobin: 13.9 g/dL (ref 12.0–15.0)
Immature Granulocytes: 0 %
Lymphocytes Relative: 25 %
Lymphs Abs: 1.1 10*3/uL (ref 0.7–4.0)
MCH: 30.5 pg (ref 26.0–34.0)
MCHC: 34.3 g/dL (ref 30.0–36.0)
MCV: 89 fL (ref 80.0–100.0)
Monocytes Absolute: 0.3 10*3/uL (ref 0.1–1.0)
Monocytes Relative: 6 %
Neutro Abs: 2.8 10*3/uL (ref 1.7–7.7)
Neutrophils Relative %: 67 %
Platelet Count: 217 10*3/uL (ref 150–400)
RBC: 4.55 MIL/uL (ref 3.87–5.11)
RDW: 13 % (ref 11.5–15.5)
WBC Count: 4.2 10*3/uL (ref 4.0–10.5)
nRBC: 0 % (ref 0.0–0.2)

## 2021-07-21 MED ORDER — LETROZOLE 2.5 MG PO TABS: 2.5000 mg | ORAL_TABLET | Freq: Every day | ORAL | 3 refills | Status: DC

## 2021-07-21 MED ORDER — GOSERELIN ACETATE 3.6 MG ~~LOC~~ IMPL
3.6000 mg | DRUG_IMPLANT | Freq: Once | SUBCUTANEOUS | Status: AC
  Administered 2021-07-21: 3.6 mg via SUBCUTANEOUS
  Filled 2021-07-21: qty 3.6

## 2021-07-21 NOTE — Progress Notes (Signed)
[facility]   [phone] [phone]   Clinic Follow up Note   Patient Care Team: Maximiano Coss, NP as PCP - General (Adult Health Nurse Practitioner) Mauro Kaufmann, RN as Oncology Nurse Navigator Rockwell Germany, RN as Oncology Nurse Navigator Stark Klein, MD as Consulting Physician (General Surgery) [name], MD as Consulting Physician (Hematology) Kyung Rudd, MD as Consulting Physician (Radiation Oncology) Estill Dooms, NP as Nurse Practitioner (Obstetrics and Gynecology)  Date of Service:  07/21/2021  CHIEF COMPLAINT: f/u of left breast cancer  CURRENT THERAPY:  Zoladex injection q 4 weeks, started on 06/24/2021 Plan to start letrozole 2.5 mg daily today  SUMMARY OF ONCOLOGIC HISTORY: Oncology History Overview Note   Cancer Staging  Malignant neoplasm of upper-outer quadrant of left breast in female, estrogen receptor positive ([place]) Staging form: Breast, AJCC 8th Edition - Clinical stage from 11/05/2020: Stage IIA (cT2, cN1, cM0, G1, ER+, PR+, HER2: Equivocal) - Signed by [name], MD on 11/10/2020 - Pathologic stage from 01/06/2021: Stage IB (pT3, pN2a, cM0, G2, ER+, PR+, HER2-) - Signed by [name], MD on 01/12/2021     Malignant neoplasm of upper-outer quadrant of left breast in female, estrogen receptor positive ([place])  10/28/2020 Mammogram   Bilateral Diagnostic Mammogram; Left Breast Ultrasound  IMPRESSION: 1. Suspicious palpable mass/hypoechoic area in the left breast at 12 o'clock retroareolar measuring 4.5 cm.   2.  Suspicious lymph node in the left axilla.   11/05/2020 Cancer Staging   Staging form: Breast, AJCC 8th Edition - Clinical stage from 11/05/2020: Stage IIA (cT2, cN1, cM0, G1, ER+, PR+, HER2: Equivocal) - Signed by [name], MD on 11/10/2020 Stage prefix: Initial diagnosis Histologic grading system: 3 grade system   11/05/2020 Pathology Results   Diagnosis 1. Breast, left, needle core biopsy, 12  o'clock subareolar, ribbon clip - INVASIVE MAMMARY CARCINOMA. SEE NOTE 1. Carcinoma measures 1.5 cm in greatest linear dimension and appears grade 2. 1. Immunohistochemical stain for E-cadherin is positive in the tumor cells, consistent with a ductal phenotype. 1. PROGNOSTIC INDICATORS Results: The tumor cells are EQUIVOCAL for Her2 (2+). Her2 by FISH will be performed and results reported separately. Estrogen Receptor: 70%, POSITIVE, STRONG-MODERATE STAINING INTENSITY Progesterone Receptor: 80%, POSITIVE, MODERATE STAINING INTENSITY Proliferation Marker Ki67: 1%  2. Lymph node, needle/core biopsy, left axilla, tribell clip - METASTATIC CARCINOMA TO A LYMPH NODE. SEE NOTE 2. Largest contiguous focus of metastatic carcinoma measures 0.3 cm. 2. PROGNOSTIC INDICATORS Results: The tumor cells are EQUIVOCAL for Her2 (2+). Her2 by FISH will be performed and results reported separately. Estrogen Receptor: 90%, POSITIVE, STRONG STAINING INTENSITY Progesterone Receptor: 95%, POSITIVE, STRONG STAINING INTENSITY Proliferation Marker Ki67: 1%   11/10/2020 Initial Diagnosis   Malignant neoplasm of upper-outer quadrant of left breast in female, estrogen receptor positive ([place])   11/19/2020 Imaging   EXAM: CT CHEST, ABDOMEN, AND PELVIS WITH CONTRAST  IMPRESSION: 1. Left breast mass  as previously described. 2. Enhancing subcentimeter left axillary lymph nodes. 3. No evidence of distal metastatic disease. 4. Small hiatal hernia   11/19/2020 Imaging   EXAM: NUCLEAR MEDICINE WHOLE BODY BONE SCAN  IMPRESSION: No evidence of metastatic disease.   11/21/2020 Imaging   EXAM: BILATERAL BREAST MRI WITH AND WITHOUT CONTRAST  IMPRESSION: 1. Suspicious 1.3 centimeter mass in the anterior UPPER central RIGHT breast warranting tissue diagnosis. (Image 65 of series 7). 2. 4 millimeter possible satellite nodule posterior to the 1.3 centimeter mass in the RIGHT breast. (Image 63 of  series 7). 3.  Indeterminate oval mass in the UPPER central middle depth of the RIGHT breast warranting tissue diagnosis. (Image 60 of series 7). 4. Large area of enhancement in the central portion of the LEFT breast, involving all quadrants. There is associated enhancement and retraction of the LEFT nipple, with significantly smaller size of the LEFT breast. Enhancement spans at least 6.5 centimeters. 5. Enlarged, previously biopsied LEFT axillary lymph node.   11/24/2020 Genetic Testing   Negative hereditary cancer genetic testing: no pathogenic variants detected in [facility] BRCAPlus STAT Panel or [employer] CustomNext-Cancer +RNAinsight Panel.  The report dates are November 24, 2020 and November 27, 2020, respectively.   The BRCAplus panel offered by [employer] and includes sequencing and deletion/duplication analysis for the following 8 genes: ATM, BRCA1, BRCA2, CDH1, CHEK2, PALB2, PTEN, and TP53.  The CustomNext-Cancer+RNAinsight panel offered by [employer] [facility] includes sequencing and rearrangement analysis for the following 47 genes:  APC, ATM, AXIN2, BARD1, BMPR1A, BRCA1, BRCA2, BRIP1, CDH1, CDK4, CDKN2A, CHEK2, DICER1, EPCAM, GREM1, HOXB13, MEN1, MLH1, MSH2, MSH3, MSH6, MUTYH, NBN, NF1, NF2, NTHL1, PALB2, PMS2, POLD1, POLE, PTEN, RAD51C, RAD51D, RECQL, RET, SDHA, SDHAF2, SDHB, SDHC, SDHD, SMAD4, SMARCA4, STK11, TP53, TSC1, TSC2, and VHL.  RNA data is routinely analyzed for use in variant interpretation for all genes.    11/27/2020 Pathology Results   Diagnosis Breast, right, needle core biopsy, anterior upper central - FIBROADENOMATOID AND FIBROCYSTIC CHANGES WITH CALCIFICATIONS - PSEUDOANGIOMATOUS STROMAL HYPERPLASIA - FOCAL PERIDUCTULAR CHRONIC INFLAMMATION - NO MALIGNANCY IDENTIFIED   01/06/2021 Cancer Staging   Staging form: Breast, AJCC 8th Edition - Pathologic stage from 01/06/2021: Stage IB (pT3, pN2a, cM0, G2, ER+, PR+, HER2-) - Signed by [name], MD on 01/12/2021 Stage prefix: Initial  diagnosis Histologic grading system: 3 grade system Residual tumor (R): R0 - None   01/06/2021 Definitive Surgery   FINAL MICROSCOPIC DIAGNOSIS:   A. LYMPH NODE, LEFT AXILLARY, SENTINEL, EXCISION:  - Metastatic carcinoma to a lymph node (1/1)  - Focus of metastatic carcinoma measures 1.5 cm without evidence of extranodal extension   B. 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 BREAST, LEFT INFERIOLATERAL MARGIN, EXCISION:  - Benign fibroadipose tissue  - Negative for carcinoma   D. LYMPH NODE, LEFT AXILLARY #1, SENTINEL, EXCISION:  - Metastatic carcinoma to a lymph node (1/1)  - Focus of metastatic carcinoma measures 1.4 cm without evidence of extranodal extension   E. LYMPH NODE, LEFT AXILLARY #2, SENTINEL, EXCISION:  - Metastatic carcinoma to a lymph node (1/1)  - Focus of metastatic carcinoma measures 0.5 cm without evidence of extranodal extension   F. LYMPH NODE, LEFT AXILLARY #3, SENTINEL, EXCISION:  - Metastatic carcinoma to a lymph node (1/1)  - Focus of metastatic carcinoma measures 0.9 cm without evidence of extranodal extension   G. LYMPH NODE, LEFT AXILLARY #4, SENTINEL, EXCISION:  - Benign fibroadipose tissue, negative for carcinoma  - Lymphoid tissue is not identified    02/11/2021 -  Chemotherapy   Patient is on Treatment Plan : BREAST ADJUVANT DOSE DENSE AC q14d / PACLitaxel q7d     06/16/2021 PET scan   IMPRESSION: 1. Postsurgical change of left mastectomy and left axillary lymph node dissection with tissue expander in place. Mild hypermetabolic activity about the surgical site without suspicious focal  hypermetabolic soft tissue nodularity, favored to reflect postsurgical change.   2. No convincing scintigraphic evidence of hypermetabolic local recurrence or  metastatic disease within the neck, chest, abdomen or pelvis.      INTERVAL HISTORY:  Pamela CENICEROS is here for a scheduled follow-up. She was last seen by Dr. Burr Medico on 06/17/2021. She presents to the clinic alone.   Ms. Roads reports that she is recovering well from the lymph node dissection on 07/07/2021. She has some soreness in the area but otherwise is doing well. She reports some body aches but uncertain if it secondary to zoladex inj or recent chemotherapy infusions. She denies any nausea, vomiting or abdominal pain. She reports constipation with a bowel movement every 2-3 days. She takes dulcolax as needed with minimal improvement. She denies easy bruising or sighs of active bleeding. She reports some vaginal itching and recently starting OTC fungal medication. She has mild neuropathy that is stable and not interfering with grip or balance. She denies fevers, chills, shortness of breath, chest pain or cough. She has no other complaints.    All other systems were reviewed with the patient and are negative.  MEDICAL HISTORY:  Past Medical History:  Diagnosis Date   Anxiety    Breast cancer ([place])    Family history of breast cancer 11/11/2020   Fibroid 10/27/2020   Has 1 x .9 x .6 cm subserosal fibroid   History of hiatal hernia    PONV (postoperative nausea and vomiting)     SURGICAL HISTORY: Past Surgical History:  Procedure Laterality Date   AXILLARY LYMPH NODE DISSECTION Left 07/07/2021   Procedure: LEFT AXILLARY LYMPH NODE DISSECTION;  Surgeon: Stark Klein, MD;  Location: [facility];  Service: General;  Laterality: Left;   BREAST RECONSTRUCTION WITH PLACEMENT OF TISSUE EXPANDER AND FLEX HD (ACELLULAR HYDRATED DERMIS) Left 01/06/2021   Procedure: LEFT BREAST RECONSTRUCTION WITH PLACEMENT OF TISSUE EXPANDER AND ACELLULAR  DERMIS;  Surgeon:  Limbo, MD;  Location: [place];  Service: Plastics;  Laterality: Left;   MASTECTOMY W/ SENTINEL NODE BIOPSY Left  01/06/2021   Procedure: LEFT MASTECTOMY WITH MAGTRACE;  Surgeon: Stark Klein, MD;  Location: [place];  Service: General;  Laterality: Left;   PORT-A-CATH REMOVAL Right 07/07/2021   Procedure: REMOVAL PORT-A-CATH;  Surgeon: Stark Klein, MD;  Location: [place];  Service: General;  Laterality: Right;   PORTACATH PLACEMENT Right 01/19/2021   Procedure: INSERTION PORT-A-CATH;  Surgeon: Stark Klein, MD;  Location: [place];  Service: General;  Laterality: Right;   RADIOACTIVE SEED GUIDED AXILLARY SENTINEL LYMPH NODE Left 01/06/2021   Procedure: RADIOACTIVE SEED GUIDED SENTINEL LYMPH NODE BIOPSY;  Surgeon: Stark Klein, MD;  Location: [place];  Service: General;  Laterality: Left;   SENTINEL NODE BIOPSY Left 01/06/2021   Procedure: LEFT AXILLARY SENTINEL LYMPH  NODE BIOPSY;  Surgeon: Stark Klein, MD;  Location: [place];  Service: General;  Laterality: Left;   WISDOM TOOTH EXTRACTION      I have reviewed the social history and family history with the patient and they are unchanged from previous note.  ALLERGIES:  is allergic to prochlorperazine.  MEDICATIONS:  Current Outpatient Medications  Medication Sig Dispense Refill   acetaminophen (TYLENOL) 500 MG tablet Take 500-1,000 mg by mouth every 6 (six) hours as needed for moderate pain or headache.     Cholecalciferol (DIALYVITE VITAMIN D 5000) 125 MCG (5000 UT) capsule Take 5,000 Units by mouth daily. Vit D and K supplement combined  diphenhydrAMINE (BENADRYL) 25 MG tablet Take 25 mg by mouth at bedtime as needed for sleep.     famotidine (PEPCID) 10 MG tablet Take 10 mg by mouth daily as needed for heartburn or indigestion.     Goserelin Acetate (ZOLADEX [place]) Inject into the skin every 30 (thirty) days.     lidocaine-prilocaine (EMLA) cream Apply to affected area once 30 g 3   loratadine (CLARITIN) 10 MG tablet Take 10 mg by mouth daily.     tretinoin (RETIN-A) 0.025 % cream Apply 1 application. topically at bedtime.     No  current facility-administered medications for this visit.    PHYSICAL EXAMINATION: ECOG PERFORMANCE STATUS: 1 - Symptomatic but completely ambulatory  Vitals:   07/21/21 0938  BP: 115/83  Pulse: 81  Resp: 18  Temp: 97.7 F (36.5 C)  SpO2: 98%   Wt Readings from Last 3 Encounters:  07/21/21 135 lb 8 oz (61.5 kg)  07/07/21 135 lb 5.8 oz (61.4 kg)  06/17/21 137 lb 4.8 oz (62.3 kg)     GENERAL:alert, no distress and comfortable SKIN: skin color, texture, turgor are normal, no rashes or significant lesions EYES: normal, Conjunctiva are pink and non-injected, sclera clear Musculoskeletal:no cyanosis of digits and no clubbing  NEURO: alert & oriented x 3 with fluent speech  LABORATORY DATA:  I have reviewed the data as listed    Latest Ref Rng & Units 07/21/2021    9:20 AM 07/06/2021    8:30 AM 06/24/2021    1:29 PM  CBC  WBC 4.0 - 10.5 K/uL 4.2  2.2  2.4   Hemoglobin 12.0 - 15.0 g/dL 13.9  13.3  12.5   Hematocrit 36.0 - 46.0 % 40.5  38.8  35.5   Platelets 150 - 400 K/uL 217  252  257         Latest Ref Rng & Units 07/06/2021    8:30 AM 06/24/2021    1:29 PM 06/17/2021    1:02 PM  CMP  Glucose 70 - 99 mg/dL 95  92  87   BUN 6 - 20 mg/dL _0 Creatinine 0.44 - 1.00 mg/dL 0.61  0.66  0.62   Sodium 135 - 145 mmol/L 139  140  139   Potassium 3.5 - 5.1 mmol/L 4.0  3.8  4.0   Chloride 98 - 111 mmol/L 106  104  104   CO2 22 - 32 mmol/L _1 Calcium 8.9 - 10.3 mg/dL 10.0  9.8  9.8   Total Protein 6.5 - 8.1 g/dL 7.1  7.2  7.3   Total Bilirubin 0.3 - 1.2 mg/dL 0.4  0.4  0.4   Alkaline Phos 38 - 126 U/L 32  30  29   AST 15 - 41 U/L _2 ALT 0 - 44 U/L _3 RADIOGRAPHIC STUDIES: I have personally reviewed the radiological images as listed and agreed with the findings in the report. No results found.    ASSESSMENT & PLAN:  KOLBIE CLARKSTON is a 41 y.o. pre-menopausal female with   1. 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 The mass has grown significantly since 2017. Biopsy on 11/05/20 confirmed invasive ductal carcinoma, with metastatic involvement of  one lymph node. -staging CT CAP and bone scan on 11/19/20 showed no distant metastasis -she underwent left mastectomy on 01/06/21 under Dr. Barry Dienes. Pathology showed 6.5 cm invasive ductal carcinoma, grade 2, involving dermis of nipple without epidermal involvement. Margins were negative, but all 4 lymph nodes showed metastatic disease. -Due to the large tumor and 4 positive lymph nodes, she received 4 cycles AC 02/11/21 - 03/25/21 followed by 12 cycles of weekly Abraxane on 04/08/21-06/24/2021. -she was seen by Dr. Lisbeth Renshaw on 06/10/21, plan for post-mastectomy radiation after ALND. -PET scan from  06/16/21, showed postsurgical change from left mastectomy, no convincing evidence of hypermetabolic local recurrence or metastatic disease.  -she underwent left ALND on 07/07/21 with Dr. Barry Dienes. Pathology revealed metastatic carcinoma to 1 of 11 lymph nodes, consistent with primary breast carcinoma.  -Patient started Zoladex injection q 4 weeks on 06/24/2021. -Plan to underwent simulation for adjuvant radiation on 08/02/2021 and plan to start radiation beginning of July. -Recommend to start letrozole 2.5 mg once daily, sent prescription.  --Due to her multiple positive lymph nodes, I also recommend adjuvant Verzenio for 2 years. Plan to start after she completes radiation.   2. Left side peripheral Neuropathy, G1 -present prior to breast cancer diagnosis. -possibly alcohol-related from prior use, discontinued alcohol with cancer diagnosis. -present intermittently and predominantly in her left hand and foot, none on right side. -she has started B complex, serum B12 and methylmalonic acid level were normal. -because she has  breast tissue expanders currently in place, she cannot have MRI  -she met Dr. Krista Blue on 06/08/21. They will monitor for now and proceed with MRI after breast reconstruction.   3. Genetics -she has a strong family history of breast cancer. She reports her sister, who was diagnosed at age 59 and has since recovered, had negative genetic testing. -genetic testing performed 11/11/20, results were negative.   4. Anxiety and stress -she has been connected with our social worker   5. Vaginal itching: -Continue with Monstat and follow up with gynecology for examination.   6. Constipation: -Recommend to take dulculax daily and add miralax if needed  Health maintenance -She had pap/pelvic 02/10/21 -she takes vitamin C, D, B complex, and pre/probiotic for GI health. Ok to continue      PLAN: -labs today reviewed and adequate for treatment. No intervention required.  -proceed with zoladex injetion today and will start letrozole 2.5 mg once daily.  -RTC in 4 weeks to see Dr. Burr Medico with labs before another zoladex injection   All questions were answered. The patient knows to call the clinic with any problems, questions or concerns. No barriers to learning was detected.  I have spent a total of 30 minutes minutes of face-to-face and non-face-to-face time, preparing to see the patient, performing a medically appropriate examination, counseling and educating the patient, ordering medications/tests, documenting clinical information in the electronic health record, and care coordination.   Dede Query PA-C Dept of Hematology and [place] at [facility] Phone: [phone]

## 2021-07-22 ENCOUNTER — Ambulatory Visit: Payer: BC Managed Care – PPO | Attending: General Surgery | Admitting: Rehabilitation

## 2021-07-22 ENCOUNTER — Encounter: Payer: Self-pay | Admitting: Rehabilitation

## 2021-07-22 ENCOUNTER — Telehealth: Payer: Self-pay | Admitting: Physician Assistant

## 2021-07-22 DIAGNOSIS — Z483 Aftercare following surgery for neoplasm: Secondary | ICD-10-CM | POA: Insufficient documentation

## 2021-07-22 DIAGNOSIS — Z170 Estrogen receptor positive status [ER+]: Secondary | ICD-10-CM | POA: Insufficient documentation

## 2021-07-22 DIAGNOSIS — R293 Abnormal posture: Secondary | ICD-10-CM | POA: Insufficient documentation

## 2021-07-22 DIAGNOSIS — R600 Localized edema: Secondary | ICD-10-CM | POA: Insufficient documentation

## 2021-07-22 DIAGNOSIS — C50412 Malignant neoplasm of upper-outer quadrant of left female breast: Secondary | ICD-10-CM | POA: Diagnosis present

## 2021-07-22 DIAGNOSIS — Z9012 Acquired absence of left breast and nipple: Secondary | ICD-10-CM | POA: Diagnosis present

## 2021-07-22 DIAGNOSIS — M25612 Stiffness of left shoulder, not elsewhere classified: Secondary | ICD-10-CM | POA: Insufficient documentation

## 2021-07-22 NOTE — Telephone Encounter (Signed)
Per 6/14 los called and spoke to pt about appointments.  Pr confirmed appointments

## 2021-07-22 NOTE — Therapy (Signed)
OUTPATIENT PHYSICAL THERAPY BREAST CANCER POST OP FOLLOW UP   Patient Name: Pamela Reyes MRN: [id number] DOB:04-06-1980, 41 y.o., female Today's Date: 07/22/2021   PT End of Session - 07/22/21 1047     Visit Number 4    Number of Visits 8    Date for PT Re-Evaluation 08/19/21    PT Start Time 1009    PT Stop Time 1046    PT Time Calculation (min) 37 min    Activity Tolerance Patient tolerated treatment well    Behavior During Therapy [facility] for tasks assessed/performed             Past Medical History:  Diagnosis Date   Anxiety    Breast cancer ([place])    Family history of breast cancer 11/11/2020   Fibroid 10/27/2020   Has 1 x .9 x .6 cm subserosal fibroid   History of hiatal hernia    PONV (postoperative nausea and vomiting)    Past Surgical History:  Procedure Laterality Date   AXILLARY LYMPH NODE DISSECTION Left 07/07/2021   Procedure: LEFT AXILLARY LYMPH NODE DISSECTION;  Surgeon: Stark Klein, MD;  Location: [place];  Service: General;  Laterality: Left;   BREAST RECONSTRUCTION WITH PLACEMENT OF TISSUE EXPANDER AND FLEX HD (ACELLULAR HYDRATED DERMIS) Left 01/06/2021   Procedure: LEFT BREAST RECONSTRUCTION WITH PLACEMENT OF TISSUE EXPANDER AND ACELLULAR  DERMIS;  Surgeon: Irene Limbo, MD;  Location: [place];  Service: Plastics;  Laterality: Left;   MASTECTOMY W/ SENTINEL NODE BIOPSY Left 01/06/2021   Procedure: LEFT MASTECTOMY WITH MAGTRACE;  Surgeon: Stark Klein, MD;  Location: [place];  Service: General;  Laterality: Left;   PORT-A-CATH REMOVAL Right 07/07/2021   Procedure: REMOVAL PORT-A-CATH;  Surgeon: Stark Klein, MD;  Location: [place];  Service: General;  Laterality: Right;   PORTACATH PLACEMENT Right 01/19/2021   Procedure: INSERTION PORT-A-CATH;  Surgeon: Stark Klein, MD;  Location: [place];  Service: General;  Laterality: Right;   RADIOACTIVE SEED GUIDED AXILLARY SENTINEL LYMPH NODE Left 01/06/2021   Procedure:  RADIOACTIVE SEED GUIDED SENTINEL LYMPH NODE BIOPSY;  Surgeon: Stark Klein, MD;  Location: [place];  Service: General;  Laterality: Left;   SENTINEL NODE BIOPSY Left 01/06/2021   Procedure: LEFT AXILLARY SENTINEL LYMPH  NODE BIOPSY;  Surgeon: Stark Klein, MD;  Location: [place];  Service: General;  Laterality: Left;   WISDOM TOOTH EXTRACTION     Patient Active Problem List   Diagnosis Date Noted   Paresthesia 06/08/2021   Port-A-Cath in place 03/25/2021   Encounter for screening fecal occult blood testing 02/10/2021   Encounter for gynecological examination with Papanicolaou smear of cervix 02/10/2021   Axillary pain, right 01/18/2021   Genetic testing 11/25/2020   Family history of breast cancer 11/11/2020   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 lymph node, left ([place]) 11/11/2020   Dense breasts 11/11/2020   Malignant neoplasm of upper-outer quadrant of left breast in female, estrogen receptor positive ([place]) 11/10/2020   Fibroid 10/27/2020   LLQ pain 10/06/2020   Left breast mass 10/06/2020   Breast pain, left 10/06/2020    PCP: Maximiano Coss, NP  REFERRING PROVIDER: Dr. Burr Medico  REFERRING DIAG: left mastectomy and ALND  THERAPY DIAG:  Aftercare following surgery for neoplasm  Malignant neoplasm of upper-outer quadrant of left breast in female, estrogen receptor positive ([place])  Abnormal posture  H/O left mastectomy  Stiffness of left shoulder, not elsewhere classified  Localized edema  Rationale for Evaluation and Treatment Rehabilitation  ONSET DATE: 10/29/20  SUBJECTIVE:                                                                                                                                                                                           SUBJECTIVE STATEMENT: 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  I feel stiff in the armpit and have some swelling and discomfort by the end of the day  PERTINENT HISTORY:  Diagnosed 10/29/2020 and found by self  exam. Biopsy revealed IDC ER, PR +, HER 2 -.  Pt underwent left mastectomy on 01/06/2021 with expander. 4+/5 LN. Chemotherapy completed ACP, Lymph node dissection on 07/07/21 with 1/11 lymph nodes positive. Radiation simulation on 08/02/21.   PATIENT GOALS:  Reassess how my recovery is going related to arm function, pain, and swelling.  PAIN:  Are you having pain? No  PRECAUTIONS: Recent Surgery, left UE Lymphedema risk  ACTIVITY LEVEL / LEISURE: still being a little cautious    OBJECTIVE:   PATIENT SURVEYS:  QUICK DASH: 18%  OBSERVATIONS:  Well healed new ALND incision with glue still present.  No sig edema.  Cording noted with AROM and PROM  POSTURE:  WNL  LYMPHEDEMA ASSESSMENT:   AROM: Right shoulder 11/18/21 07/22/21 Left shoulder  11/18/20 07/22/21  Flexion 145  Flexion 155 130- cording noted  Abduction 171  Abduction 180 120 - cording noted  ER 100  ER 100 90  Extension 42  Extension 55 60   Circumferences:  Rt UE 07/22/21 Lt UE 07/22/21  10cm proximal 27.2 26.5  olecranon 24.5 23.2  10cm proximal to ulnar styloid 22 20.1  Styloid process 15.6 15.3  Back of hand 18.5 18.5  Base of 2nd digit 6.4 6.4     PATIENT EDUCATION:  Education details: use of pillow and self massage in axilla, continuing post op stretches in supine for flexion and chest stretch, lymphedema risk reduction reviewed, use of sleeve for work and work outs now with increased lymphedema risk. POC Person educated: Patient Education method: Explanation, Demonstration, and Verbal cues Education comprehension: verbalized understanding  HOME EXERCISE PROGRAM:  Reviewed previously given post op HEP supine flexion and chest stretch, use of axillary pillow and self circles, trying sleeve for any cording discomfort.   ASSESSMENT:  CLINICAL IMPRESSION: Pt known to this clinic returns 2 weeks post ALND with removal of 11 additional lymph nodes.  Pt has decreased AROM into flexion and abduction with cording  noted in the axilla.  This was improved with PROM and brief cording release today so pt should improve quickly with return to stretches and a few PT sessions.  Pt has radiation simulation 08/02/21 so we will maximize ROM for this.   Pt will benefit from skilled therapeutic intervention to improve on the following deficits: Decreased knowledge of precautions, impaired UE functional use, pain, decreased ROM, postural dysfunction.   PT treatment/interventions: ADL/Self care home management, Therapeutic exercises, Patient/Family education, Manual therapy, and Re-evaluation  GOALS: Goals reviewed with patient? Yes  LONG TERM GOALS:  (STG=LTG)  GOALS Name Target Date  Goal status  1 Pt will demonstrate she has regained full shoulder ROM and function post operatively compared to baselines.  Baseline: 08/19/2021 INITIAL  2 Pt will be ready for radiation without delay due to improved Rt ROM 08/19/21 INITIAL  3 Pt will be educated on use of compression sleeve for work and exercise activities.  08/19/21 MET          PLAN: PT FREQUENCY/DURATION: 1x per week x 4 weeks.   PLAN FOR NEXT SESSION: Lt shoulder PROM, AAROM, pulleys, etc.  Cording release.     Stark Bray, PT 07/22/2021, 10:48 AM

## 2021-07-23 ENCOUNTER — Encounter: Payer: Self-pay | Admitting: *Deleted

## 2021-07-27 ENCOUNTER — Ambulatory Visit: Payer: BC Managed Care – PPO

## 2021-07-27 DIAGNOSIS — R293 Abnormal posture: Secondary | ICD-10-CM

## 2021-07-27 DIAGNOSIS — M25612 Stiffness of left shoulder, not elsewhere classified: Secondary | ICD-10-CM

## 2021-07-27 DIAGNOSIS — Z170 Estrogen receptor positive status [ER+]: Secondary | ICD-10-CM

## 2021-07-27 DIAGNOSIS — Z483 Aftercare following surgery for neoplasm: Secondary | ICD-10-CM

## 2021-07-27 DIAGNOSIS — C50412 Malignant neoplasm of upper-outer quadrant of left female breast: Secondary | ICD-10-CM

## 2021-07-27 DIAGNOSIS — R600 Localized edema: Secondary | ICD-10-CM

## 2021-07-27 DIAGNOSIS — Z9012 Acquired absence of left breast and nipple: Secondary | ICD-10-CM

## 2021-07-27 NOTE — Therapy (Signed)
OUTPATIENT PHYSICAL THERAPY BREAST CANCER POST OP FOLLOW UP   Patient Name: Pamela Reyes MRN: [id number] DOB:10/24/1980, 41 y.o., female Today's Date: 07/27/2021   PT End of Session - 07/27/21 0801     Visit Number 5    Number of Visits 8    Date for PT Re-Evaluation 08/19/21    PT Start Time 0803    PT Stop Time 0850    PT Time Calculation (min) 47 min    Activity Tolerance Patient tolerated treatment well    Behavior During Therapy [facility] for tasks assessed/performed             Past Medical History:  Diagnosis Date   Anxiety    Breast cancer ([place])    Family history of breast cancer 11/11/2020   Fibroid 10/27/2020   Has 1 x .9 x .6 cm subserosal fibroid   History of hiatal hernia    PONV (postoperative nausea and vomiting)    Past Surgical History:  Procedure Laterality Date   AXILLARY LYMPH NODE DISSECTION Left 07/07/2021   Procedure: LEFT AXILLARY LYMPH NODE DISSECTION;  Surgeon: Stark Klein, MD;  Location: [place];  Service: General;  Laterality: Left;   BREAST RECONSTRUCTION WITH PLACEMENT OF TISSUE EXPANDER AND FLEX HD (ACELLULAR HYDRATED DERMIS) Left 01/06/2021   Procedure: LEFT BREAST RECONSTRUCTION WITH PLACEMENT OF TISSUE EXPANDER AND ACELLULAR  DERMIS;  Surgeon: Irene Limbo, MD;  Location: [place];  Service: Plastics;  Laterality: Left;   MASTECTOMY W/ SENTINEL NODE BIOPSY Left 01/06/2021   Procedure: LEFT MASTECTOMY WITH MAGTRACE;  Surgeon: Stark Klein, MD;  Location: [place];  Service: General;  Laterality: Left;   PORT-A-CATH REMOVAL Right 07/07/2021   Procedure: REMOVAL PORT-A-CATH;  Surgeon: Stark Klein, MD;  Location: [place];  Service: General;  Laterality: Right;   PORTACATH PLACEMENT Right 01/19/2021   Procedure: INSERTION PORT-A-CATH;  Surgeon: Stark Klein, MD;  Location: [place];  Service: General;  Laterality: Right;   RADIOACTIVE SEED GUIDED AXILLARY SENTINEL LYMPH NODE Left 01/06/2021   Procedure:  RADIOACTIVE SEED GUIDED SENTINEL LYMPH NODE BIOPSY;  Surgeon: Stark Klein, MD;  Location: [place];  Service: General;  Laterality: Left;   SENTINEL NODE BIOPSY Left 01/06/2021   Procedure: LEFT AXILLARY SENTINEL LYMPH  NODE BIOPSY;  Surgeon: Stark Klein, MD;  Location: [place];  Service: General;  Laterality: Left;   WISDOM TOOTH EXTRACTION     Patient Active Problem List   Diagnosis Date Noted   Paresthesia 06/08/2021   Port-A-Cath in place 03/25/2021   Encounter for screening fecal occult blood testing 02/10/2021   Encounter for gynecological examination with Papanicolaou smear of cervix 02/10/2021   Axillary pain, right 01/18/2021   Genetic testing 11/25/2020   Family history of breast cancer 11/11/2020   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 lymph node, left ([place]) 11/11/2020   Dense breasts 11/11/2020   Malignant neoplasm of upper-outer quadrant of left breast in female, estrogen receptor positive ([place]) 11/10/2020   Fibroid 10/27/2020   LLQ pain 10/06/2020   Left breast mass 10/06/2020   Breast pain, left 10/06/2020    PCP: Maximiano Coss, NP  REFERRING PROVIDER: Dr. Burr Medico  REFERRING DIAG: left mastectomy and ALND  THERAPY DIAG:  Aftercare following surgery for neoplasm  Malignant neoplasm of upper-outer quadrant of left breast in female, estrogen receptor positive ([place])  Abnormal posture  H/O left mastectomy  Stiffness of left shoulder, not elsewhere classified  Localized edema  Rationale for Evaluation and Treatment Rehabilitation  ONSET DATE: 10/29/20  SUBJECTIVE:                                                                                                                                                                                           SUBJECTIVE STATEMENT: I have been stretching at home. The cording is feeling better. I am not sure if its really there. I haven't noticed any swelling in my arm and the armpit swelling is still there but goes away when  I stop.   PERTINENT HISTORY:  Diagnosed 10/29/2020 and found by self exam. Biopsy revealed IDC ER, PR +, HER 2 -.  Pt underwent left mastectomy on 01/06/2021 with expander. 4+/5 LN. Chemotherapy completed ACP, Lymph node dissection on 07/07/21 with 1/11 lymph nodes positive. Radiation simulation on 08/02/21.   PATIENT GOALS:  Reassess how my recovery is going related to arm function, pain, and swelling.  PAIN:  Are you having pain? No  PRECAUTIONS: Recent Surgery, left UE Lymphedema risk  ACTIVITY LEVEL / LEISURE: still being a little cautious    OBJECTIVE:   PATIENT SURVEYS:  QUICK DASH: 18%  OBSERVATIONS:  Well healed new ALND incision with glue still present.  No sig edema.  Cording noted with AROM and PROM  POSTURE:  WNL  LYMPHEDEMA ASSESSMENT:   AROM: Right shoulder 11/18/21 07/22/21 Left shoulder  11/18/20 07/22/21  Flexion 145  Flexion 155 130- cording noted  Abduction 171  Abduction 180 120 - cording noted  ER 100  ER 100 90  Extension 42  Extension 55 60   Circumferences:  Rt UE 07/22/21 Lt UE 07/22/21  10cm proximal 27.2 26.5  olecranon 24.5 23.2  10cm proximal to ulnar styloid 22 20.1  Styloid process 15.6 15.3  Back of hand 18.5 18.5  Base of 2nd digit 6.4 6.4      TREATMENT TODAY 07/27/2021  Pulleys for shoulder flexion, scaption, and abduction x 1:30 seconds, ball rolls on wall x 5 flexion, x 5 abduction. Pt returned therapist demo PROM left shoulder flexion, scaption, abd, ER with Vc's to pt to relax  Lower trunk rotation legs to the left x5 Towel roll between shoulder blades with bilateral shoulder flexion, scaption, horizontal abduction , flexion into abd with ER x 5 Supine scapular series bilateral horizontal abd, bilateral shoulder flexion and ER with yellow band x 5    PATIENT EDUCATION:   Person educated: Patient educated in supine scapular series except sword x 5 reps with yellow band Education method: Explanation, Demonstration, and Verbal  cues Education comprehension: verbalized understanding  HOME EXERCISE PROGRAM:  Reviewed previously given post op HEP supine flexion and chest  stretch, use of axillary pillow and self circles, trying sleeve for any cording discomfort. Supine scapular series except sword x 5 with yellow  ASSESSMENT:  CLINICAL IMPRESSION: Pt is making excellent progress with A/PROM of the left shoulder.  There are no signs of cording and pt has only mild end range tightness. Initiated low repetition gentle strength and pt used good form. Cautioned to start slowly and not overdo it   Pt will benefit from skilled therapeutic intervention to improve on the following deficits: Decreased knowledge of precautions, impaired UE functional use, pain, decreased ROM, postural dysfunction.   PT treatment/interventions: ADL/Self care home management, Therapeutic exercises, Patient/Family education, Manual therapy, and Re-evaluation  GOALS: Goals reviewed with patient? Yes  LONG TERM GOALS:  (STG=LTG)  GOALS Name Target Date  Goal status  1 Pt will demonstrate she has regained full shoulder ROM and function post operatively compared to baselines.  Baseline: 08/19/2021 INITIAL  2 Pt will be ready for radiation without delay due to improved Rt ROM 08/19/21 INITIAL  3 Pt will be educated on use of compression sleeve for work and exercise activities.  08/19/21 MET          PLAN: PT FREQUENCY/DURATION: 1x per week x 4 weeks.   PLAN FOR NEXT SESSION: check goals, pt to bring sleeve to be shown how to don/doff properly. Lt shoulder PROM, AAROM, pulleys, etc.  Cording release.     Claris Pong, PT 07/27/2021, 8:52 AM

## 2021-07-27 NOTE — Patient Instructions (Signed)

## 2021-07-28 ENCOUNTER — Ambulatory Visit: Payer: BC Managed Care – PPO

## 2021-07-28 ENCOUNTER — Telehealth: Payer: Self-pay

## 2021-07-28 ENCOUNTER — Ambulatory Visit: Payer: BC Managed Care – PPO | Admitting: Radiation Oncology

## 2021-07-28 NOTE — Telephone Encounter (Signed)
This nurse received a message from this patient stating that she wanted to to know what the status of the Letrozole that was supposed to be ordered for her.  This nurse verified that order was received by calling [facility] and was made aware that it is available for pickup.  Left a message for patient informing her of availability.  Patient knows to call clinic if there are any further questions or concerns.

## 2021-08-02 ENCOUNTER — Other Ambulatory Visit: Payer: Self-pay

## 2021-08-02 ENCOUNTER — Ambulatory Visit
Admission: RE | Admit: 2021-08-02 | Discharge: 2021-08-02 | Disposition: A | Discharge status: Home / Self Care | Payer: BC Managed Care – PPO | Source: Ambulatory Visit | Attending: Radiation Oncology | Admitting: Radiation Oncology

## 2021-08-02 DIAGNOSIS — C50412 Malignant neoplasm of upper-outer quadrant of left female breast: Secondary | ICD-10-CM | POA: Diagnosis present

## 2021-08-02 DIAGNOSIS — Z170 Estrogen receptor positive status [ER+]: Secondary | ICD-10-CM | POA: Insufficient documentation

## 2021-08-12 ENCOUNTER — Encounter: Payer: Self-pay | Admitting: *Deleted

## 2021-08-13 DIAGNOSIS — Z170 Estrogen receptor positive status [ER+]: Secondary | ICD-10-CM | POA: Diagnosis present

## 2021-08-13 DIAGNOSIS — C50912 Malignant neoplasm of unspecified site of left female breast: Secondary | ICD-10-CM | POA: Diagnosis present

## 2021-08-13 DIAGNOSIS — C773 Secondary and unspecified malignant neoplasm of axilla and upper limb lymph nodes: Secondary | ICD-10-CM | POA: Insufficient documentation

## 2021-08-13 DIAGNOSIS — C50412 Malignant neoplasm of upper-outer quadrant of left female breast: Secondary | ICD-10-CM | POA: Insufficient documentation

## 2021-08-16 ENCOUNTER — Other Ambulatory Visit: Payer: Self-pay

## 2021-08-16 ENCOUNTER — Ambulatory Visit
Admission: RE | Admit: 2021-08-16 | Discharge: 2021-08-16 | Disposition: A | Discharge status: Home / Self Care | Payer: BC Managed Care – PPO | Source: Ambulatory Visit | Attending: Radiation Oncology | Admitting: Radiation Oncology

## 2021-08-16 DIAGNOSIS — C50412 Malignant neoplasm of upper-outer quadrant of left female breast: Secondary | ICD-10-CM | POA: Diagnosis not present

## 2021-08-16 LAB — RAD ONC ARIA SESSION SUMMARY

## 2021-08-17 ENCOUNTER — Other Ambulatory Visit: Payer: Self-pay

## 2021-08-17 ENCOUNTER — Ambulatory Visit
Admission: RE | Admit: 2021-08-17 | Discharge: 2021-08-17 | Disposition: A | Discharge status: Home / Self Care | Payer: BC Managed Care – PPO | Source: Ambulatory Visit | Attending: Radiation Oncology | Admitting: Radiation Oncology

## 2021-08-17 DIAGNOSIS — C50412 Malignant neoplasm of upper-outer quadrant of left female breast: Secondary | ICD-10-CM | POA: Diagnosis not present

## 2021-08-17 LAB — RAD ONC ARIA SESSION SUMMARY
Course Elapsed Days: 1
Plan Fractions Treated to Date: 1
Plan Fractions Treated to Date: 2
Plan Prescribed Dose Per Fraction: 1.8 Gy
Plan Prescribed Dose Per Fraction: 1.8 Gy
Plan Total Fractions Prescribed: 14
Plan Total Fractions Prescribed: 28
Plan Total Prescribed Dose: 25.2 Gy
Plan Total Prescribed Dose: 50.4 Gy
Reference Point Dosage Given to Date: 3.6 Gy
Reference Point Dosage Given to Date: 3.6 Gy
Reference Point Session Dosage Given: 1.8 Gy
Reference Point Session Dosage Given: 1.8 Gy
Session Number: 2

## 2021-08-18 ENCOUNTER — Other Ambulatory Visit: Payer: Self-pay

## 2021-08-18 ENCOUNTER — Ambulatory Visit
Admission: RE | Admit: 2021-08-18 | Discharge: 2021-08-18 | Disposition: A | Discharge status: Home / Self Care | Payer: BC Managed Care – PPO | Source: Ambulatory Visit | Attending: Radiation Oncology | Admitting: Radiation Oncology

## 2021-08-18 DIAGNOSIS — C50412 Malignant neoplasm of upper-outer quadrant of left female breast: Secondary | ICD-10-CM | POA: Diagnosis not present

## 2021-08-18 LAB — RAD ONC ARIA SESSION SUMMARY
Course Elapsed Days: 2
Plan Fractions Treated to Date: 2
Plan Fractions Treated to Date: 3
Plan Prescribed Dose Per Fraction: 1.8 Gy
Plan Prescribed Dose Per Fraction: 1.8 Gy
Plan Total Fractions Prescribed: 14
Plan Total Fractions Prescribed: 28
Plan Total Prescribed Dose: 25.2 Gy
Plan Total Prescribed Dose: 50.4 Gy
Reference Point Dosage Given to Date: 5.4 Gy
Reference Point Dosage Given to Date: 5.4 Gy
Reference Point Session Dosage Given: 1.8 Gy
Reference Point Session Dosage Given: 1.8 Gy
Session Number: 3

## 2021-08-19 ENCOUNTER — Other Ambulatory Visit: Payer: Self-pay

## 2021-08-19 ENCOUNTER — Ambulatory Visit
Admission: RE | Admit: 2021-08-19 | Discharge: 2021-08-19 | Disposition: A | Discharge status: Home / Self Care | Payer: BC Managed Care – PPO | Source: Ambulatory Visit | Attending: Radiation Oncology | Admitting: Radiation Oncology

## 2021-08-19 ENCOUNTER — Inpatient Hospital Stay: Payer: BC Managed Care – PPO | Attending: Hematology | Admitting: Hematology

## 2021-08-19 ENCOUNTER — Encounter: Payer: Self-pay | Admitting: Hematology

## 2021-08-19 ENCOUNTER — Inpatient Hospital Stay: Payer: BC Managed Care – PPO

## 2021-08-19 VITALS — BP 107/61 | HR 70 | Temp 98.8°F | Resp 17 | Ht 64.0 in | Wt 132.0 lb

## 2021-08-19 DIAGNOSIS — Z5111 Encounter for antineoplastic chemotherapy: Secondary | ICD-10-CM | POA: Insufficient documentation

## 2021-08-19 DIAGNOSIS — C773 Secondary and unspecified malignant neoplasm of axilla and upper limb lymph nodes: Secondary | ICD-10-CM | POA: Diagnosis not present

## 2021-08-19 DIAGNOSIS — Z170 Estrogen receptor positive status [ER+]: Secondary | ICD-10-CM

## 2021-08-19 DIAGNOSIS — C50412 Malignant neoplasm of upper-outer quadrant of left female breast: Secondary | ICD-10-CM | POA: Insufficient documentation

## 2021-08-19 DIAGNOSIS — F419 Anxiety disorder, unspecified: Secondary | ICD-10-CM | POA: Insufficient documentation

## 2021-08-19 DIAGNOSIS — Z803 Family history of malignant neoplasm of breast: Secondary | ICD-10-CM | POA: Insufficient documentation

## 2021-08-19 DIAGNOSIS — G629 Polyneuropathy, unspecified: Secondary | ICD-10-CM | POA: Diagnosis not present

## 2021-08-19 DIAGNOSIS — Z95828 Presence of other vascular implants and grafts: Secondary | ICD-10-CM

## 2021-08-19 DIAGNOSIS — R102 Pelvic and perineal pain: Secondary | ICD-10-CM | POA: Diagnosis not present

## 2021-08-19 DIAGNOSIS — C50912 Malignant neoplasm of unspecified site of left female breast: Secondary | ICD-10-CM

## 2021-08-19 LAB — RAD ONC ARIA SESSION SUMMARY
Course Elapsed Days: 3
Plan Fractions Treated to Date: 2
Plan Fractions Treated to Date: 4
Plan Prescribed Dose Per Fraction: 1.8 Gy
Plan Prescribed Dose Per Fraction: 1.8 Gy
Plan Total Fractions Prescribed: 14
Plan Total Fractions Prescribed: 28
Plan Total Prescribed Dose: 25.2 Gy
Plan Total Prescribed Dose: 50.4 Gy
Reference Point Dosage Given to Date: 7.2 Gy
Reference Point Dosage Given to Date: 7.2 Gy
Reference Point Session Dosage Given: 1.8 Gy
Reference Point Session Dosage Given: 1.8 Gy
Session Number: 4

## 2021-08-19 LAB — CMP (CANCER CENTER ONLY)
ALT: 19 U/L (ref 0–44)
AST: 18 U/L (ref 15–41)
Albumin: 4.4 g/dL (ref 3.5–5.0)
Alkaline Phosphatase: 40 U/L (ref 38–126)
Anion gap: 6 (ref 5–15)
BUN: 17 mg/dL (ref 6–20)
CO2: 29 mmol/L (ref 22–32)
Calcium: 9.9 mg/dL (ref 8.9–10.3)
Chloride: 104 mmol/L (ref 98–111)
Creatinine: 0.66 mg/dL (ref 0.44–1.00)
GFR, Estimated: >60 mL/min (ref >60)
Glucose, Bld: 89 mg/dL (ref 70–99)
Potassium: 4.5 mmol/L (ref 3.5–5.1)
Sodium: 139 mmol/L (ref 135–145)
Total Bilirubin: 0.5 mg/dL (ref 0.3–1.2)
Total Protein: 7.1 g/dL (ref 6.5–8.1)

## 2021-08-19 LAB — CBC WITH DIFFERENTIAL (CANCER CENTER ONLY)
Abs Immature Granulocytes: 0.01 10*3/uL (ref 0.00–0.07)
Basophils Absolute: 0 10*3/uL (ref 0.0–0.1)
Basophils Relative: 1 %
Eosinophils Absolute: 0.1 10*3/uL (ref 0.0–0.5)
Eosinophils Relative: 2 %
HCT: 39 % (ref 36.0–46.0)
Hemoglobin: 13.4 g/dL (ref 12.0–15.0)
Immature Granulocytes: 0 %
Lymphocytes Relative: 31 %
Lymphs Abs: 1 10*3/uL (ref 0.7–4.0)
MCH: 30.3 pg (ref 26.0–34.0)
MCHC: 34.4 g/dL (ref 30.0–36.0)
MCV: 88.2 fL (ref 80.0–100.0)
Monocytes Absolute: 0.3 10*3/uL (ref 0.1–1.0)
Monocytes Relative: 8 %
Neutro Abs: 2 10*3/uL (ref 1.7–7.7)
Neutrophils Relative %: 58 %
Platelet Count: 211 10*3/uL (ref 150–400)
RBC: 4.42 MIL/uL (ref 3.87–5.11)
RDW: 12.8 % (ref 11.5–15.5)
WBC Count: 3.4 10*3/uL — ABNORMAL LOW (ref 4.0–10.5)
nRBC: 0 % (ref 0.0–0.2)

## 2021-08-19 MED ORDER — RADIAPLEXRX EX GEL: Freq: Once | CUTANEOUS | Status: AC

## 2021-08-19 MED ORDER — GOSERELIN ACETATE 3.6 MG ~~LOC~~ IMPL
3.6000 mg | DRUG_IMPLANT | Freq: Once | SUBCUTANEOUS | Status: AC
  Administered 2021-08-19: 3.6 mg via SUBCUTANEOUS
  Filled 2021-08-19: qty 3.6

## 2021-08-19 MED ORDER — ALRA NON-METALLIC DEODORANT (RAD-ONC)
1.0000 | Freq: Once | TOPICAL | Status: AC
  Administered 2021-08-19: 1 via TOPICAL

## 2021-08-19 NOTE — Progress Notes (Signed)
Pamela Reyes   [phone] [phone]   Clinic Follow up Note   Patient Care Team: Maximiano Coss, NP as PCP - General (Adult Health Nurse Practitioner) Mauro Kaufmann, RN as Oncology Nurse Navigator Rockwell Germany, RN as Oncology Nurse Navigator Stark Klein, MD as Consulting Physician (General Surgery) [name], MD as Consulting Physician (Hematology) Kyung Rudd, MD as Consulting Physician (Radiation Oncology) Estill Dooms, NP as Nurse Practitioner (Obstetrics and Gynecology)  Date of Service:  08/19/2021  CHIEF COMPLAINT: f/u of left breast cancer  CURRENT THERAPY:  Adjuvant chemo Abraxane   ASSESSMENT & PLAN:  Pamela Reyes is a 41 y.o. pre-menopausal female with   1. 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 The mass has grown significantly since 2017. Biopsy on 11/05/20 confirmed invasive ductal carcinoma, with metastatic involvement of one lymph node. -staging CT CAP and bone scan on 11/19/20 showed no distant metastasis -she underwent left mastectomy on 01/06/21 under Dr. Barry Dienes. Pathology showed 6.5 cm invasive ductal carcinoma, grade 2, involving dermis of nipple without epidermal involvement. Margins were negative, but all 4 lymph nodes showed metastatic disease. -Due to the large tumor and 4 positive lymph nodes, she received 4 cycles AC 02/11/21 - 03/25/21. -she started Abraxane on 04/08/21. She tolerated well overall, no complaints. Last dose scheduled for 06/24/21. -she was seen by Dr. Lisbeth Renshaw on 06/10/21, plan for post-mastectomy radiation after ALND. -PET scan yesterday, 06/16/21, showed postsurgical change from left mastectomy, no convincing evidence of hypermetabolic local recurrence or metastatic disease. I reviewed the results with her today. -plan  to start Zoladex injections next week, in anticipation of beginning letrozole in a month  -she underwent left ALND on 07/07/21 with Dr. Barry Dienes and had one out of 11 nodes positive -she started letrozole on 6/14, she is tolerating well overall, has moderate hot flash which started with chemo and Zoladex injection -she started RT on 7/10 -Due to her multiple positive lymph nodes, I also recommend adjuvant Verzenio for 2 years.  Benefit and potential side effects reviewed with her, she is interested.  Plan to start after she completes radiation.  I will order at her next visit   2. Left side peripheral Neuropathy, G1 -present prior to breast cancer diagnosis. -possibly alcohol-related from prior use, discontinued alcohol with cancer diagnosis. -present intermittently and predominantly in her left hand and foot, none on right side. -she has started B complex, serum B12 and methylmalonic acid level were normal. -because she has breast tissue expanders currently in place, she cannot have MRI  -she met Dr. Krista Blue on 06/08/21. They will monitor for now and proceed with MRI after breast reconstruction.   3. Genetics -she has a strong family history of breast cancer. She reports her sister, who was diagnosed at age 32 and has since recovered, had negative genetic testing. -genetic testing performed 11/11/20, results were negative.   4. Anxiety and stress -she has been connected with our social worker   5. Health maintenance -She had pap/pelvic 02/10/21 -she takes vitamin C, D, B complex, and pre/probiotic for GI health. Ok to continue    6. Vaginal tightness -She has a vaginal pain with intercourse, probably related to antiestrogen therapy -Continue pelvic physical therapy -I encouraged her to follow-up with her gynecologist, to consider Josph Macho procedure -She knows to use lubricate, vaginal estrogen  will be the last resort if nothing else works    PLAN: -We will proceed with Zoladex injection and  continue every 4 weeks -Continue radiation until August 24 -Follow-up with her last week of her radiation, plan to order Verzenio on next visit. -Due to previous chemotherapy Adriamycin, I will repeat echocardiogram in the next months.  No problem-specific Assessment & Plan notes found for this encounter.   SUMMARY OF ONCOLOGIC HISTORY: Oncology History Overview Note   Cancer Staging  Malignant neoplasm of upper-outer quadrant of left breast in female, estrogen receptor positive ([place]) Staging form: Breast, AJCC 8th Edition - Clinical stage from 11/05/2020: Stage IIA (cT2, cN1, cM0, G1, ER+, PR+, HER2: Equivocal) - Signed by [name], MD on 11/10/2020 - Pathologic stage from 01/06/2021: Stage IB (pT3, pN2a, cM0, G2, ER+, PR+, HER2-) - Signed by [name], MD on 01/12/2021     Malignant neoplasm of upper-outer quadrant of left breast in female, estrogen receptor positive ([place])  10/28/2020 Mammogram   Bilateral Diagnostic Mammogram; Left Breast Ultrasound  IMPRESSION: 1. Suspicious palpable mass/hypoechoic area in the left breast at 12 o'clock retroareolar measuring 4.5 cm.   2.  Suspicious lymph node in the left axilla.   11/05/2020 Cancer Staging   Staging form: Breast, AJCC 8th Edition - Clinical stage from 11/05/2020: Stage IIA (cT2, cN1, cM0, G1, ER+, PR+, HER2: Equivocal) - Signed by [name], MD on 11/10/2020 Stage prefix: Initial diagnosis Histologic grading system: 3 grade system   11/05/2020 Pathology Results   Diagnosis 1. Breast, left, needle core biopsy, 12 o'clock subareolar, ribbon clip - INVASIVE MAMMARY CARCINOMA. SEE NOTE 1. Carcinoma measures 1.5 cm in greatest linear dimension and appears grade 2. 1. Immunohistochemical stain for E-cadherin is positive in the tumor cells, consistent with a ductal phenotype. 1. PROGNOSTIC INDICATORS Results: The tumor cells are EQUIVOCAL for Her2 (2+). Her2 by FISH will be performed and results reported separately. Estrogen  Receptor: 70%, POSITIVE, STRONG-MODERATE STAINING INTENSITY Progesterone Receptor: 80%, POSITIVE, MODERATE STAINING INTENSITY Proliferation Marker Ki67: 1%  2. Lymph node, needle/core biopsy, left axilla, tribell clip - METASTATIC CARCINOMA TO A LYMPH NODE. SEE NOTE 2. Largest contiguous focus of metastatic carcinoma measures 0.3 cm. 2. PROGNOSTIC INDICATORS Results: The tumor cells are EQUIVOCAL for Her2 (2+). Her2 by FISH will be performed and results reported separately. Estrogen Receptor: 90%, POSITIVE, STRONG STAINING INTENSITY Progesterone Receptor: 95%, POSITIVE, STRONG STAINING INTENSITY Proliferation Marker Ki67: 1%   11/10/2020 Initial Diagnosis   Malignant neoplasm of upper-outer quadrant of left breast in female, estrogen receptor positive ([place])   11/19/2020 Imaging   EXAM: CT CHEST, ABDOMEN, AND PELVIS WITH CONTRAST  IMPRESSION: 1. Left breast mass  as previously described. 2. Enhancing subcentimeter left axillary lymph nodes. 3. No evidence of distal metastatic disease. 4. Small hiatal hernia   11/19/2020 Imaging   EXAM: NUCLEAR MEDICINE WHOLE BODY BONE SCAN  IMPRESSION: No evidence of metastatic disease.   11/21/2020 Imaging   EXAM: BILATERAL BREAST MRI WITH AND WITHOUT CONTRAST  IMPRESSION: 1. Suspicious 1.3 centimeter mass in the anterior UPPER central RIGHT breast warranting tissue diagnosis. (Image 65 of series 7). 2. 4 millimeter possible satellite nodule posterior to the 1.3 centimeter mass in the RIGHT breast. (Image 63 of series 7). 3. Indeterminate oval mass in the UPPER central middle depth of the RIGHT breast warranting tissue diagnosis. (Image 60 of series 7). 4. Large area of enhancement in the central portion of the LEFT breast, involving all quadrants. There is associated enhancement and  retraction of the LEFT nipple, with significantly smaller size of the LEFT breast. Enhancement spans at least 6.5 centimeters. 5. Enlarged, previously  biopsied LEFT axillary lymph node.   11/24/2020 Genetic Testing   Negative hereditary cancer genetic testing: no pathogenic variants detected in [facility] BRCAPlus STAT Panel or [facility] CustomNext-Cancer +RNAinsight Panel.  The report dates are November 24, 2020 and November 27, 2020, respectively.   The BRCAplus panel offered by [employer] and includes sequencing and deletion/duplication analysis for the following 8 genes: ATM, BRCA1, BRCA2, CDH1, CHEK2, PALB2, PTEN, and TP53.  The CustomNext-Cancer+RNAinsight panel offered by [facility] includes sequencing and rearrangement analysis for the following 47 genes:  APC, ATM, AXIN2, BARD1, BMPR1A, BRCA1, BRCA2, BRIP1, CDH1, CDK4, CDKN2A, CHEK2, DICER1, EPCAM, GREM1, HOXB13, MEN1, MLH1, MSH2, MSH3, MSH6, MUTYH, NBN, NF1, NF2, NTHL1, PALB2, PMS2, POLD1, POLE, PTEN, RAD51C, RAD51D, RECQL, RET, SDHA, SDHAF2, SDHB, SDHC, SDHD, SMAD4, SMARCA4, STK11, TP53, TSC1, TSC2, and VHL.  RNA data is routinely analyzed for use in variant interpretation for all genes.    11/27/2020 Pathology Results   Diagnosis Breast, right, needle core biopsy, anterior upper central - FIBROADENOMATOID AND FIBROCYSTIC CHANGES WITH CALCIFICATIONS - PSEUDOANGIOMATOUS STROMAL HYPERPLASIA - FOCAL PERIDUCTULAR CHRONIC INFLAMMATION - NO MALIGNANCY IDENTIFIED   01/06/2021 Cancer Staging   Staging form: Breast, AJCC 8th Edition - Pathologic stage from 01/06/2021: Stage IB (pT3, pN2a, cM0, G2, ER+, PR+, HER2-) - Signed by [name], MD on 01/12/2021 Stage prefix: Initial diagnosis Histologic grading system: 3 grade system Residual tumor (R): R0 - None   01/06/2021 Definitive Surgery   FINAL MICROSCOPIC DIAGNOSIS:   A. LYMPH NODE, LEFT AXILLARY, SENTINEL, EXCISION:  - Metastatic carcinoma to a lymph node (1/1)  - Focus of metastatic carcinoma measures 1.5 cm without evidence of extranodal extension   B. BREAST, LEFT, MASTECTOMY:  - Invasive ductal carcinoma, 6.5 cm, grade 2  -  Carcinoma involves dermis of the nipple without epidermal involvement  - Carcinoma is less than 1 mm from the posterior/ deep margin  - See oncology table   C. BREAST, LEFT INFERIOLATERAL MARGIN, EXCISION:  - Benign fibroadipose tissue  - Negative for carcinoma   D. LYMPH NODE, LEFT AXILLARY #1, SENTINEL, EXCISION:  - Metastatic carcinoma to a lymph node (1/1)  - Focus of metastatic carcinoma measures 1.4 cm without evidence of extranodal extension   E. LYMPH NODE, LEFT AXILLARY #2, SENTINEL, EXCISION:  - Metastatic carcinoma to a lymph node (1/1)  - Focus of metastatic carcinoma measures 0.5 cm without evidence of extranodal extension   F. LYMPH NODE, LEFT AXILLARY #3, SENTINEL, EXCISION:  - Metastatic carcinoma to a lymph node (1/1)  - Focus of metastatic carcinoma measures 0.9 cm without evidence of extranodal extension   G. LYMPH NODE, LEFT AXILLARY #4, SENTINEL, EXCISION:  - Benign fibroadipose tissue, negative for carcinoma  - Lymphoid tissue is not identified    02/11/2021 -  Chemotherapy   Patient is on Treatment Plan : BREAST ADJUVANT DOSE DENSE AC q14d / PACLitaxel q7d     06/16/2021 PET scan   IMPRESSION: 1. Postsurgical change of left mastectomy and left axillary lymph node dissection with tissue expander in place. Mild hypermetabolic activity about the surgical site without suspicious focal hypermetabolic soft tissue nodularity, favored to reflect postsurgical change.   2. No convincing scintigraphic evidence of hypermetabolic local recurrence or metastatic disease within the neck, chest, abdomen or pelvis.      INTERVAL HISTORY:  Pamela Reyes is here for a  scheduled follow-up and chemotherapy. She started letrozole 3 weeks ago, she is tolerating well overall.  She has been having mild to moderate hot flash since she started chemo, no worse lately.  She also has some joint achiness, overall tolerable.  She reports low libido, and vaginal pain with intercourse.   She has started to pelvic physical therapy, has not seen an oncologist yet.  She started radiation earlier this week, tolerating well so far.  No other new complaints.   All other systems were reviewed with the patient and are negative.  MEDICAL HISTORY:  Past Medical History:  Diagnosis Date   Anxiety    Breast cancer ([place])    Family history of breast cancer 11/11/2020   Fibroid 10/27/2020   Has 1 x .9 x .6 cm subserosal fibroid   History of hiatal hernia    PONV (postoperative nausea and vomiting)     SURGICAL HISTORY: Past Surgical History:  Procedure Laterality Date   AXILLARY LYMPH NODE DISSECTION Left 07/07/2021   Procedure: LEFT AXILLARY LYMPH NODE DISSECTION;  Surgeon: Stark Klein, MD;  Location: [facility];  Service: General;  Laterality: Left;   BREAST RECONSTRUCTION WITH PLACEMENT OF TISSUE EXPANDER AND FLEX HD (ACELLULAR HYDRATED DERMIS) Left 01/06/2021   Procedure: LEFT BREAST RECONSTRUCTION WITH PLACEMENT OF TISSUE EXPANDER AND ACELLULAR  DERMIS;  Surgeon: Irene Limbo, MD;  Location: [facility];  Service: Plastics;  Laterality: Left;   MASTECTOMY W/ SENTINEL NODE BIOPSY Left 01/06/2021   Procedure: LEFT MASTECTOMY WITH MAGTRACE;  Surgeon: Stark Klein, MD;  Location: [facility];  Service: General;  Laterality: Left;   PORT-A-CATH REMOVAL Right 07/07/2021   Procedure: REMOVAL PORT-A-CATH;  Surgeon: Stark Klein, MD;  Location: [facility];  Service: General;  Laterality: Right;   PORTACATH PLACEMENT Right 01/19/2021   Procedure: INSERTION PORT-A-CATH;  Surgeon: Stark Klein, MD;  Location: [facility];  Service: General;  Laterality: Right;   RADIOACTIVE SEED GUIDED AXILLARY SENTINEL LYMPH NODE Left 01/06/2021   Procedure: RADIOACTIVE SEED GUIDED SENTINEL LYMPH NODE BIOPSY;  Surgeon: Stark Klein, MD;  Location: [facility];  Service: General;  Laterality: Left;   SENTINEL NODE BIOPSY Left 01/06/2021   Procedure: LEFT AXILLARY SENTINEL LYMPH  NODE BIOPSY;   Surgeon: Stark Klein, MD;  Location: [facility];  Service: General;  Laterality: Left;   WISDOM TOOTH EXTRACTION      I have reviewed the social history and family history with the patient and they are unchanged from previous note.  ALLERGIES:  is allergic to prochlorperazine.  MEDICATIONS:  Current Outpatient Medications  Medication Sig Dispense Refill   acetaminophen (TYLENOL) 500 MG tablet Take 500-1,000 mg by mouth every 6 (six) hours as needed for moderate pain or headache.     Cholecalciferol (DIALYVITE VITAMIN D 5000) 125 MCG (5000 UT) capsule Take 5,000 Units by mouth daily. Vit D and K supplement combined     diphenhydrAMINE (BENADRYL) 25 MG tablet Take 25 mg by mouth at bedtime as needed for sleep.     famotidine (PEPCID) 10 MG tablet Take 10 mg by mouth daily as needed for heartburn or indigestion.     Goserelin Acetate (ZOLADEX [place]) Inject into the skin every 30 (thirty) days.     letrozole (FEMARA) 2.5 MG tablet Take 1 tablet (2.5 mg total) by mouth daily. 30 tablet 3   lidocaine-prilocaine (EMLA) cream Apply to affected area once 30 g 3   loratadine (CLARITIN) 10 MG tablet Take 10 mg by mouth daily.  tretinoin (RETIN-A) 0.025 % cream Apply 1 application. topically at bedtime.     No current facility-administered medications for this visit.    PHYSICAL EXAMINATION: ECOG PERFORMANCE STATUS: 1 - Symptomatic but completely ambulatory  Vitals:   08/19/21 1119  BP: 107/61  Pulse: 70  Resp: 17  Temp: 98.8 F (37.1 C)  SpO2: 100%    Wt Readings from Last 3 Encounters:  08/19/21 132 lb (59.9 kg)  07/21/21 135 lb 8 oz (61.5 kg)  07/07/21 135 lb 5.8 oz (61.4 kg)     GENERAL:alert, no distress and comfortable SKIN: skin color, texture, turgor are normal, no rashes or significant lesions EYES: normal, Conjunctiva are pink and non-injected, sclera clear Musculoskeletal:no cyanosis of digits and no clubbing  NEURO: alert & oriented x 3 with fluent speech BREAST: Status  post left mastectomy with tissue expander.  Surgical scars are healed well, no palpable mass or adenopathy.  Right breast exam was negative.  LABORATORY DATA:  I have reviewed the data as listed    Latest Ref Rng & Units 08/19/2021   10:53 AM 07/21/2021    9:20 AM 07/06/2021    8:30 AM  CBC  WBC 4.0 - 10.5 K/uL 3.4  4.2  2.2   Hemoglobin 12.0 - 15.0 g/dL 13.4  13.9  13.3   Hematocrit 36.0 - 46.0 % 39.0  40.5  38.8   Platelets 150 - 400 K/uL 211  217  252         Latest Ref Rng & Units 08/19/2021   10:53 AM 07/21/2021    9:20 AM 07/06/2021    8:30 AM  CMP  Glucose 70 - 99 mg/dL 89  101  95   BUN 6 - 20 mg/dL _0 Creatinine 0.44 - 1.00 mg/dL 0.66  0.71  0.61   Sodium 135 - 145 mmol/L 139  140  139   Potassium 3.5 - 5.1 mmol/L 4.5  3.9  4.0   Chloride 98 - 111 mmol/L 104  104  106   CO2 22 - 32 mmol/L _1 Calcium 8.9 - 10.3 mg/dL 9.9  10.4  10.0   Total Protein 6.5 - 8.1 g/dL 7.1  7.6  7.1   Total Bilirubin 0.3 - 1.2 mg/dL 0.5  0.4  0.4   Alkaline Phos 38 - 126 U/L 40  34  32   AST 15 - 41 U/L _2 ALT 0 - 44 U/L _3 RADIOGRAPHIC STUDIES: I have personally reviewed the radiological images as listed and agreed with the findings in the report. No results found.    Orders Placed This Encounter  Procedures   ECHOCARDIOGRAM COMPLETE    Standing Status:   Future    Standing Expiration Date:   08/20/2022    Order Specific Question:   Where should this test be performed    Answer:   [facility]    Order Specific Question:   Perflutren DEFINITY (image enhancing agent) should be administered unless hypersensitivity or allergy exist    Answer:   Administer Perflutren    Order Specific Question:   Is a special reader required? (athlete or structural heart)    Answer:   No    Order Specific Question:   Does this study need to be read by the Structural team/Level 3 readers?    Answer:   No  Order Specific Question:   Reason for exam-Echo     Answer:   Chemo  Z09    Order Specific Question:   Release to patient    Answer:   Immediate   All questions were answered. The patient knows to call the clinic with any problems, questions or concerns. No barriers to learning was detected. The total time spent in the appointment was 30 minutes.     [name], MD 08/19/2021

## 2021-08-19 NOTE — Progress Notes (Signed)

## 2021-08-20 ENCOUNTER — Ambulatory Visit: Payer: BC Managed Care – PPO

## 2021-08-20 ENCOUNTER — Telehealth: Payer: Self-pay | Admitting: Hematology

## 2021-08-20 LAB — CANCER ANTIGEN 27.29: CA 27.29: 16.2 U/mL (ref 0.0–38.6)

## 2021-08-20 NOTE — Telephone Encounter (Signed)
Unable to leave message with follow-up appointments per 7/13 los. Mailed calendar.

## 2021-08-23 ENCOUNTER — Ambulatory Visit
Admission: RE | Admit: 2021-08-23 | Discharge: 2021-08-23 | Disposition: A | Discharge status: Home / Self Care | Payer: BC Managed Care – PPO | Source: Ambulatory Visit | Attending: Radiation Oncology | Admitting: Radiation Oncology

## 2021-08-23 ENCOUNTER — Other Ambulatory Visit: Payer: Self-pay

## 2021-08-23 ENCOUNTER — Ambulatory Visit: Payer: BC Managed Care – PPO

## 2021-08-23 DIAGNOSIS — C50412 Malignant neoplasm of upper-outer quadrant of left female breast: Secondary | ICD-10-CM | POA: Diagnosis not present

## 2021-08-23 LAB — RAD ONC ARIA SESSION SUMMARY
Course Elapsed Days: 7
Plan Fractions Treated to Date: 3
Plan Fractions Treated to Date: 5
Plan Prescribed Dose Per Fraction: 1.8 Gy
Plan Prescribed Dose Per Fraction: 1.8 Gy
Plan Total Fractions Prescribed: 14
Plan Total Fractions Prescribed: 28
Plan Total Prescribed Dose: 25.2 Gy
Plan Total Prescribed Dose: 50.4 Gy
Reference Point Dosage Given to Date: 9 Gy
Reference Point Dosage Given to Date: 9 Gy
Reference Point Session Dosage Given: 1.8 Gy
Reference Point Session Dosage Given: 1.8 Gy
Session Number: 5

## 2021-08-24 ENCOUNTER — Ambulatory Visit
Admission: RE | Admit: 2021-08-24 | Discharge: 2021-08-24 | Disposition: A | Discharge status: Home / Self Care | Payer: BC Managed Care – PPO | Source: Ambulatory Visit | Attending: Radiation Oncology | Admitting: Radiation Oncology

## 2021-08-24 ENCOUNTER — Other Ambulatory Visit: Payer: Self-pay

## 2021-08-24 DIAGNOSIS — C50412 Malignant neoplasm of upper-outer quadrant of left female breast: Secondary | ICD-10-CM | POA: Diagnosis not present

## 2021-08-24 LAB — RAD ONC ARIA SESSION SUMMARY
Course Elapsed Days: 8
Plan Fractions Treated to Date: 3
Plan Fractions Treated to Date: 6
Plan Prescribed Dose Per Fraction: 1.8 Gy
Plan Prescribed Dose Per Fraction: 1.8 Gy
Plan Total Fractions Prescribed: 14
Plan Total Fractions Prescribed: 28
Plan Total Prescribed Dose: 25.2 Gy
Plan Total Prescribed Dose: 50.4 Gy
Reference Point Dosage Given to Date: 10.8 Gy
Reference Point Dosage Given to Date: 10.8 Gy
Reference Point Session Dosage Given: 1.8 Gy
Reference Point Session Dosage Given: 1.8 Gy
Session Number: 6

## 2021-08-25 ENCOUNTER — Other Ambulatory Visit: Payer: Self-pay

## 2021-08-25 ENCOUNTER — Ambulatory Visit
Admission: RE | Admit: 2021-08-25 | Discharge: 2021-08-25 | Disposition: A | Discharge status: Home / Self Care | Payer: BC Managed Care – PPO | Source: Ambulatory Visit | Attending: Radiation Oncology | Admitting: Radiation Oncology

## 2021-08-25 DIAGNOSIS — C50412 Malignant neoplasm of upper-outer quadrant of left female breast: Secondary | ICD-10-CM | POA: Diagnosis not present

## 2021-08-25 LAB — RAD ONC ARIA SESSION SUMMARY
Course Elapsed Days: 9
Plan Fractions Treated to Date: 4
Plan Fractions Treated to Date: 7
Plan Prescribed Dose Per Fraction: 1.8 Gy
Plan Prescribed Dose Per Fraction: 1.8 Gy
Plan Total Fractions Prescribed: 14
Plan Total Fractions Prescribed: 28
Plan Total Prescribed Dose: 25.2 Gy
Plan Total Prescribed Dose: 50.4 Gy
Reference Point Dosage Given to Date: 12.6 Gy
Reference Point Dosage Given to Date: 12.6 Gy
Reference Point Session Dosage Given: 1.8 Gy
Reference Point Session Dosage Given: 1.8 Gy
Session Number: 7

## 2021-08-26 ENCOUNTER — Other Ambulatory Visit: Payer: Self-pay

## 2021-08-26 ENCOUNTER — Encounter: Payer: Self-pay | Admitting: Physical Therapy

## 2021-08-26 ENCOUNTER — Ambulatory Visit
Admission: RE | Admit: 2021-08-26 | Discharge: 2021-08-26 | Disposition: A | Discharge status: Home / Self Care | Payer: BC Managed Care – PPO | Source: Ambulatory Visit | Attending: Radiation Oncology | Admitting: Radiation Oncology

## 2021-08-26 ENCOUNTER — Ambulatory Visit: Payer: BC Managed Care – PPO | Attending: General Surgery | Admitting: Physical Therapy

## 2021-08-26 DIAGNOSIS — M62838 Other muscle spasm: Secondary | ICD-10-CM | POA: Insufficient documentation

## 2021-08-26 DIAGNOSIS — M6281 Muscle weakness (generalized): Secondary | ICD-10-CM | POA: Diagnosis present

## 2021-08-26 DIAGNOSIS — C50412 Malignant neoplasm of upper-outer quadrant of left female breast: Secondary | ICD-10-CM | POA: Diagnosis not present

## 2021-08-26 DIAGNOSIS — R279 Unspecified lack of coordination: Secondary | ICD-10-CM | POA: Diagnosis present

## 2021-08-26 LAB — RAD ONC ARIA SESSION SUMMARY
Course Elapsed Days: 10
Plan Fractions Treated to Date: 4
Plan Fractions Treated to Date: 8
Plan Prescribed Dose Per Fraction: 1.8 Gy
Plan Prescribed Dose Per Fraction: 1.8 Gy
Plan Total Fractions Prescribed: 14
Plan Total Fractions Prescribed: 28
Plan Total Prescribed Dose: 25.2 Gy
Plan Total Prescribed Dose: 50.4 Gy
Reference Point Dosage Given to Date: 14.4 Gy
Reference Point Dosage Given to Date: 14.4 Gy
Reference Point Session Dosage Given: 1.8 Gy
Reference Point Session Dosage Given: 1.8 Gy
Session Number: 8

## 2021-08-26 NOTE — Therapy (Signed)
OUTPATIENT PHYSICAL THERAPY FEMALE PELVIC EVALUATION   Patient Name: Pamela Reyes MRN: [id number] DOB:1980/11/03, 41 y.o., female Today's Date: 08/26/2021   PT End of Session - 08/26/21 1139     Visit Number 6   pf 1   Date for PT Re-Evaluation 11/26/21    Authorization Type BCBS    PT Start Time 1101    PT Stop Time 1140    PT Time Calculation (min) 39 min    Activity Tolerance Patient tolerated treatment well    Behavior During Therapy WFL for tasks assessed/performed             Past Medical History:  Diagnosis Date   Anxiety    Breast cancer ([place])    Family history of breast cancer 11/11/2020   Fibroid 10/27/2020   Has 1 x .9 x .6 cm subserosal fibroid   History of hiatal hernia    PONV (postoperative nausea and vomiting)    Past Surgical History:  Procedure Laterality Date   AXILLARY LYMPH NODE DISSECTION Left 07/07/2021   Procedure: LEFT AXILLARY LYMPH NODE DISSECTION;  Surgeon: Stark Klein, MD;  Location: [place];  Service: General;  Laterality: Left;   BREAST RECONSTRUCTION WITH PLACEMENT OF TISSUE EXPANDER AND FLEX HD (ACELLULAR HYDRATED DERMIS) Left 01/06/2021   Procedure: LEFT BREAST RECONSTRUCTION WITH PLACEMENT OF TISSUE EXPANDER AND ACELLULAR  DERMIS;  Surgeon: Irene Limbo, MD;  Location: [place];  Service: Plastics;  Laterality: Left;   MASTECTOMY W/ SENTINEL NODE BIOPSY Left 01/06/2021   Procedure: LEFT MASTECTOMY WITH MAGTRACE;  Surgeon: Stark Klein, MD;  Location: [place];  Service: General;  Laterality: Left;   PORT-A-CATH REMOVAL Right 07/07/2021   Procedure: REMOVAL PORT-A-CATH;  Surgeon: Stark Klein, MD;  Location: [place];  Service: General;  Laterality: Right;   PORTACATH PLACEMENT Right 01/19/2021   Procedure: INSERTION PORT-A-CATH;  Surgeon: Stark Klein, MD;  Location: [place];  Service: General;  Laterality: Right;   RADIOACTIVE SEED GUIDED AXILLARY SENTINEL LYMPH NODE Left 01/06/2021   Procedure:  RADIOACTIVE SEED GUIDED SENTINEL LYMPH NODE BIOPSY;  Surgeon: Stark Klein, MD;  Location: [place];  Service: General;  Laterality: Left;   SENTINEL NODE BIOPSY Left 01/06/2021   Procedure: LEFT AXILLARY SENTINEL LYMPH  NODE BIOPSY;  Surgeon: Stark Klein, MD;  Location: [place];  Service: General;  Laterality: Left;   WISDOM TOOTH EXTRACTION     Patient Active Problem List   Diagnosis Date Noted   Paresthesia 06/08/2021   Port-A-Cath in place 03/25/2021   Encounter for screening fecal occult blood testing 02/10/2021   Encounter for gynecological examination with Papanicolaou smear of cervix 02/10/2021   Axillary pain, right 01/18/2021   Genetic testing 11/25/2020   Family history of breast cancer 11/11/2020   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 lymph node, left ([place]) 11/11/2020   Dense breasts 11/11/2020   Malignant neoplasm of upper-outer quadrant of left breast in female, estrogen receptor positive ([place]) 11/10/2020   Fibroid 10/27/2020   LLQ pain 10/06/2020   Left breast mass 10/06/2020   Breast pain, left 10/06/2020    PCP: Maximiano Coss, NP  REFERRING PROVIDER: Stark Klein, MD  REFERRING DIAG: [phone] (ICD-10-CM) - Malignant neoplasm of upper-outer quadrant of left female breast C50.912 (ICD-10-CM) - Malignant neoplasm of unspecified site of left female breast N89.8 (ICD-10-CM) - Other specified noninflammatory disorders of vagina  THERAPY DIAG:  Muscle weakness (generalized)  Other muscle spasm  Unspecified lack of coordination  Rationale for Evaluation and  Treatment Rehabilitation  ONSET DATE: 2-3 months ago  SUBJECTIVE:                                                                                                                                                                                           SUBJECTIVE STATEMENT: Pt reports pain with intercourse since starting breast cancer treatment. Dryness and itchiness as well.   Fluid intake: Yes: 64 oz      PAIN:  Are you having pain? No   PRECAUTIONS: actively having CA radiation treatment  WEIGHT BEARING RESTRICTIONS No  FALLS:  Has patient fallen in last 6 months? No  LIVING ENVIRONMENT: Lives with: lives with their spouse Lives in: House/apartment   OCCUPATION: used book Training and development officer  PLOF: Independent  PATIENT GOALS to have less pain at vaginal opening  PERTINENT HISTORY:  underwent left mastectomy on 01/06/21, 4 cycles AC 02/11/21 - 03/25/21,  additional 12 cycles 06/24/21, left ALND on 07/07/21 with Dr. Barry Dienes and had one out of 11 nodes positive,  Sexual abuse: No  BOWEL MOVEMENT Pain with bowel movement: No Type of bowel movement:Type (Bristol Stool Scale) 1, Frequency every 2-3 days, and Strain No Fully empty rectum: Yes:   Leakage: No Pads: No Fiber supplement: No  URINATION Pain with urination: No Fully empty bladder: No Stream: Strong Urgency: No Frequency: not quicker than every 2 hours Leakage:  no Pads: No  INTERCOURSE Pain with intercourse: Initial Penetration, During Penetration, and During Climax Ability to have vaginal penetration:  Yes: but painful Climax: painful  Marinoff Scale: 2/3  PREGNANCY Vaginal deliveries 0 Tearing No C-section deliveries 0 Currently pregnant No  PROLAPSE None    OBJECTIVE:   DIAGNOSTIC FINDINGS:    COGNITION:  Overall cognitive status: Within functional limits for tasks assessed     SENSATION:  Light touch: Appears intact  Proprioception: Appears intact  MUSCLE LENGTH: Bil hamstrings and adductors limited by 25%                POSTURE: rounded shoulders  LUMBARAROM/PROM  A/PROM A/PROM  eval  Flexion Limited by 75%  Extension WFL  Right lateral flexion WFL  Left lateral flexion WFL  Right rotation Limited by 50%  Left rotation Limited by 50%   (Blank rows = not tested)  LOWER EXTREMITY ROM:  WFL  LOWER EXTREMITY MMT:  Hips grossly 4+/5; knees and ankles 5/5   PALPATION:    General  no TTP but mild fascial restrictions at abdomen in all quadrants                External Perineal Exam no TTP but did have mild  dryness                              Internal Pelvic Floor mild TTP throughout pelvic floor at superficial layers and deep layers, mildly worse at Rt side compared to lt. Pt denied pain but reports she could feel the pressure/tension.   Patient confirms identification and approves PT to assess internal pelvic floor and treatment Yes  PELVIC MMT:   MMT eval  Vaginal 5/5; 9s isometrics; 8 reps  Internal Anal Sphincter   External Anal Sphincter   Puborectalis   Diastasis Recti   (Blank rows = not tested)        TONE: WFL  PROLAPSE: Not seen in hooklying   TODAY'S TREATMENT  EVAL Examination completed, findings reviewed, pt educated on POC, HEP, and feminine moisturizers and lubricants and pt has already purchases vaginal dilators and requested information on how to use this was given and discussed. However pt educated on attempting moisturizers and HEP first to see if these help. Pt motivated to participate in PT and agreeable to attempt recommendations.     PATIENT EDUCATION:  Education details: [id number] Person educated: Patient Education method: Consulting civil engineer, Demonstration, Corporate treasurer cues, Verbal cues, and Handouts Education comprehension: verbalized understanding and returned demonstration   HOME EXERCISE PROGRAM: [id number]  ASSESSMENT:  CLINICAL IMPRESSION: Patient is a 41 y.o. female  who was seen today for physical therapy evaluation and treatment for pain with vaginal penetration. Pt reports since having cancer treatments and removal of estrogen, pt has had vaginal/vulva dryness and irritation with intermittent itching and pain with intercourse. Pt found to have decreased flexibility in spine and bil hips, does report intermittent constipation with CA treatments, mild fascial restrictions in abdomen, but no external pain. Pt consented to  internal vaginal assessment this date and found to have tension and mild discomfort with one gloved digit palpation superficial and deep muscle layers. Pt reports she has more pain compared to this with intercourse but has not started moisturizer use yet. Pt tolerated assessment well but does have increased tension throughout with Rt side slightly worse compared to Lt. Pt very motivated to improve and participate with PT, given and went over HEP, feminine moisturizers, lubricants, and how to use vaginal dilators. Pt denied additional questions. Pt would benefit from additional PT to further address deficits.     OBJECTIVE IMPAIRMENTS decreased coordination, decreased endurance, increased fascial restrictions, impaired flexibility, improper body mechanics, postural dysfunction, and pain.   ACTIVITY LIMITATIONS  intercourse  PARTICIPATION LIMITATIONS: interpersonal relationship  PERSONAL FACTORS Time since onset of injury/illness/exacerbation and 1 comorbidity: medical history  are also affecting patient's functional outcome.   REHAB POTENTIAL: Good  CLINICAL DECISION MAKING: Stable/uncomplicated  EVALUATION COMPLEXITY: Low   GOALS: Goals reviewed with patient? Yes  SHORT TERM GOALS: Target date: 09/23/2021  Pt to be I with HEP.  Baseline: Goal status: INITIAL  2.  Pt to be I with abdominal massage and pelvic relaxation techniques for improved pain levels.  Baseline:  Goal status: INITIAL  3.  Pt to report no more than 5/10 pain with intercourse for improved tolerance to vaginal penetration.  Baseline:  Goal status: INITIAL   LONG TERM GOALS: Target date:  11/26/21    Pt to be I with advanced HEP.  Baseline:  Goal status: INITIAL  2.  Pt to report no more than 2/10 pain with vaginal penetration for improved tolerance to intercourse and medical exams.  Baseline:  Goal status: INITIAL  3.  Pt to be I with moisturizer use and report improvement of vaginal dryness by at least  50% for improved QOL.  Baseline:  Goal status: INITIAL  PLAN: PT FREQUENCY: every other week  PT DURATION:  6 sessions  PLANNED INTERVENTIONS: Therapeutic exercises, Therapeutic activity, Neuromuscular re-education, Patient/Family education, Self Care, Joint mobilization, Dry Needling, Spinal mobilization, Cryotherapy, Moist heat, Taping, Biofeedback, and Manual therapy  PLAN FOR NEXT SESSION: breathing mechanics, pelvic relaxation, voiding mechanics, stretching of hips/back  Stacy Gardner, PT, DPT 08/26/2309:56 AM

## 2021-08-26 NOTE — Patient Instructions (Addendum)
Moisturizers They are used in the vagina to hydrate the mucous membrane that make up the vaginal canal. Designed to keep a more normal acid balance (ph) Once placed in the vagina, it will last between two to three days.  Use 2-3 times per week at bedtime  Ingredients to avoid is glycerin and fragrance, can increase chance of infection Should not be used just before sex due to causing irritation Most are gels administered either in a tampon-shaped applicator or as a vaginal suppository. They are non-hormonal.   Types of Moisturizers(internal use)  Vitamin E vaginal suppositories- Whole foods, Amazon Moist Again Coconut oil- can break down condoms Julva- (Do no use if on Tamoxifen) amazon Yes moisturizer- amazon NeuEve Silk , NeuEve Silver for menopausal or over 65 (if have severe vaginal atrophy or cancer treatments use NeuEve Silk for  1 month than move to [employer]eps[employer])- [employer], [place].com Olive and Bee intimate cream- www.oliveandbee.com.au Mae vaginal moisturizer- Amazon Aloe Good Clean Love Hyaluronic acid Hyalofemme replens   Creams to use externally on the Vulva area Albertson's (good for for cancer patients that had radiation to the area)- [place] or [employer].FlyingBasics.com.br V-magic cream - amazon Julva-amazon Vital "V Wild Yam salve ( help moisturize and help with thinning vulvar area, does have [place] by Irwin Brakeman labial moisturizer (Amazon,  Coconut or olive oil aloe Good Clean Love Enchanted Rose by intimate rose  Things to avoid in the vaginal area Do not use things to irritate the vulvar area No lotions just specialized creams for the vulva area- Neogyn, V-magic, No soaps; can use Aveeno or Calendula cleanser if needed. Must be gentle No deodorants No douches Good to sleep without underwear to let the vaginal area to air out No scrubbing: spread the lips to let warm water rinse  over labias and pat dry  Lubrication Used for intercourse to reduce friction Avoid ones that have glycerin, nonoxynol-9, petroleum, propylene glycol, chlorhexidine gluconate, warming gels, tingling gels, icing or cooling gel, scented Avoid parabens due to a preservative similar to female sex hormone May need to be reapplied once or several times during sexual activity Can be applied to both partners genitals prior to vaginal penetration to minimize friction or irritation Prevent irritation and mucosal tears that cause post coital pain and increased the risk of vaginal and urinary tract infections Oil-based lubricants cannot be used with condoms due to breaking them down.  Least likely to irritate vaginal tissue.  Plant based-lubes are safe Silicone-based lubrication are thicker and last long and used for post-menopausal women  Vaginal Lubricators Here is a list of some suggested lubricators you can use for intercourse. Use the most hypoallergenic product.  You can place on you or your partner.  Slippery Stuff ( water based) Sylk or Sliquid Natural H2O ( good  if frequent UTI's)- walmart, amazon Sliquid organics silk-(aloe and silicone based ) [employer] (www.blossom-organics.com)- (aloe based ) Coconut oil, olive oil -not good with condoms  PJur Woman Nude- (water based) amazon Uberlube- ( silicon) [place] has an organic one Yes lubricant- (water based and has plant oil based similar to silicone) [employer] Platinum-Silicone, Target, [facility] Olive and Bee intimate cream-  www.oliveandbee.com.au Pink - [employer] Erosense Sync- walmart, amazon Coconu- coconu.com Desert [employer] to avoid in lubricants are glycerin, warming gels, tingling gels, icing or cooling  gels, and scented gels.  Also avoid Vaseline. KY jelly,  and Astroglide contain chlorhexidine which kills good bacteria(lactobacilli)  Things to avoid in the vaginal area Do not use  things to irritate the vulvar area No lotions- see below Soaps you  can use :Aveeno, Calendula, Good Clean Love cleanser if needed. Must be gentle No deodorants No douches Good to sleep without underwear to let the vaginal area to air out No scrubbing: spread the lips to let warm water rinse over labias and pat dry  Creams that can be used on the Vulva Area V [employer], walmart Vital V Wild Yam Salve Julva- [employer] Botanical Pro-Meno Wild Yam Cream Coconut oil, olive oil Cleo by [employer] labial moisturizer -Amazon,  [place] Releveum ( lidocaine) or Desert Harvest Gele Yes Moisturizer       PROTOCOL FOR VAGINAL DILATORS   Wash dilator with soap and water prior to insertion.    Lay on your back reclined. Knees are to be up and apart while on your bed or in the bathtub with warm water.   Lubricate the end of the dilator with a water-soluble lubricant.  Separate the labia.   Tense the pelvic floor muscles than relax; while relaxing, slide lubricated dilator( round side of dilator) into the vagina.  Insert toward the direction of your spine. Dilator should feel snug and no pain more than 3/10.   Tense muscles again while holding the dilator so it does not get pushed out; relax and slide it in a little further.   Try blowing out as if filling a balloon; this may relax the muscles and allow penetration.  Repeat blowing out to insert dilator further.  Keep dilator in for 10 minutes if tolerate, with the pelvic floor muscles relaxed to further stretch the canal.   Never force the dilator into the canal. Once the dilator is comfortable start to move in and out, side to side, move your hips in different directions  1-2 times per week Progression of dilator.  When you are able to place dilator into vaginal canal and feel no pain or able to move without difficulty you are ready for the next size.  Before you go to the next size start with the original size for 2  minutes then use the next size up for 5 minutes. When the next size up is easy to use, do not have to start with the smaller size.

## 2021-08-27 ENCOUNTER — Ambulatory Visit
Admission: RE | Admit: 2021-08-27 | Discharge: 2021-08-27 | Disposition: A | Discharge status: Home / Self Care | Payer: BC Managed Care – PPO | Source: Ambulatory Visit | Attending: Radiation Oncology | Admitting: Radiation Oncology

## 2021-08-27 ENCOUNTER — Other Ambulatory Visit: Payer: Self-pay

## 2021-08-27 DIAGNOSIS — C50412 Malignant neoplasm of upper-outer quadrant of left female breast: Secondary | ICD-10-CM | POA: Diagnosis not present

## 2021-08-27 LAB — RAD ONC ARIA SESSION SUMMARY
Course Elapsed Days: 11
Plan Fractions Treated to Date: 5
Plan Fractions Treated to Date: 9
Plan Prescribed Dose Per Fraction: 1.8 Gy
Plan Prescribed Dose Per Fraction: 1.8 Gy
Plan Total Fractions Prescribed: 14
Plan Total Fractions Prescribed: 28
Plan Total Prescribed Dose: 25.2 Gy
Plan Total Prescribed Dose: 50.4 Gy
Reference Point Dosage Given to Date: 16.2 Gy
Reference Point Dosage Given to Date: 16.2 Gy
Reference Point Session Dosage Given: 1.8 Gy
Reference Point Session Dosage Given: 1.8 Gy
Session Number: 9

## 2021-08-30 ENCOUNTER — Ambulatory Visit
Admission: RE | Admit: 2021-08-30 | Discharge: 2021-08-30 | Disposition: A | Discharge status: Home / Self Care | Payer: BC Managed Care – PPO | Source: Ambulatory Visit | Attending: Radiation Oncology | Admitting: Radiation Oncology

## 2021-08-30 ENCOUNTER — Ambulatory Visit (HOSPITAL_BASED_OUTPATIENT_CLINIC_OR_DEPARTMENT_OTHER)
Admission: RE | Admit: 2021-08-30 | Discharge: 2021-08-30 | Disposition: A | Discharge status: Home / Self Care | Payer: BC Managed Care – PPO | Source: Ambulatory Visit | Attending: Hematology | Admitting: Hematology

## 2021-08-30 ENCOUNTER — Other Ambulatory Visit: Payer: Self-pay

## 2021-08-30 DIAGNOSIS — C50412 Malignant neoplasm of upper-outer quadrant of left female breast: Secondary | ICD-10-CM | POA: Insufficient documentation

## 2021-08-30 DIAGNOSIS — Z0189 Encounter for other specified special examinations: Secondary | ICD-10-CM | POA: Diagnosis not present

## 2021-08-30 DIAGNOSIS — Z170 Estrogen receptor positive status [ER+]: Secondary | ICD-10-CM | POA: Insufficient documentation

## 2021-08-30 DIAGNOSIS — Z01818 Encounter for other preprocedural examination: Secondary | ICD-10-CM | POA: Insufficient documentation

## 2021-08-30 LAB — RAD ONC ARIA SESSION SUMMARY
Course Elapsed Days: 14
Plan Fractions Treated to Date: 10
Plan Fractions Treated to Date: 5
Plan Prescribed Dose Per Fraction: 1.8 Gy
Plan Prescribed Dose Per Fraction: 1.8 Gy
Plan Total Fractions Prescribed: 14
Plan Total Fractions Prescribed: 28
Plan Total Prescribed Dose: 25.2 Gy
Plan Total Prescribed Dose: 50.4 Gy
Reference Point Dosage Given to Date: 18 Gy
Reference Point Dosage Given to Date: 18 Gy
Reference Point Session Dosage Given: 1.8 Gy
Reference Point Session Dosage Given: 1.8 Gy
Session Number: 10

## 2021-08-30 LAB — ECHOCARDIOGRAM COMPLETE
AR max vel: 2.21 cm2
AV Peak grad: 4.4 mmHg
Ao pk vel: 1.05 m/s
Area-P 1/2: 3.28 cm2
Calc EF: 53.9 %
S' Lateral: 2.7 cm
Single Plane A2C EF: 54.7 %
Single Plane A4C EF: 53.4 %

## 2021-08-31 ENCOUNTER — Other Ambulatory Visit: Payer: Self-pay

## 2021-08-31 ENCOUNTER — Ambulatory Visit
Admission: RE | Admit: 2021-08-31 | Discharge: 2021-08-31 | Disposition: A | Discharge status: Home / Self Care | Payer: BC Managed Care – PPO | Source: Ambulatory Visit | Attending: Radiation Oncology | Admitting: Radiation Oncology

## 2021-08-31 DIAGNOSIS — C50412 Malignant neoplasm of upper-outer quadrant of left female breast: Secondary | ICD-10-CM | POA: Diagnosis not present

## 2021-08-31 LAB — RAD ONC ARIA SESSION SUMMARY
Course Elapsed Days: 15
Plan Fractions Treated to Date: 11
Plan Fractions Treated to Date: 6
Plan Prescribed Dose Per Fraction: 1.8 Gy
Plan Prescribed Dose Per Fraction: 1.8 Gy
Plan Total Fractions Prescribed: 14
Plan Total Fractions Prescribed: 28
Plan Total Prescribed Dose: 25.2 Gy
Plan Total Prescribed Dose: 50.4 Gy
Reference Point Dosage Given to Date: 19.8 Gy
Reference Point Dosage Given to Date: 19.8 Gy
Reference Point Session Dosage Given: 1.8 Gy
Reference Point Session Dosage Given: 1.8 Gy
Session Number: 11

## 2021-09-01 ENCOUNTER — Other Ambulatory Visit: Payer: Self-pay

## 2021-09-01 ENCOUNTER — Ambulatory Visit
Admission: RE | Admit: 2021-09-01 | Discharge: 2021-09-01 | Disposition: A | Discharge status: Home / Self Care | Payer: BC Managed Care – PPO | Source: Ambulatory Visit | Attending: Radiation Oncology | Admitting: Radiation Oncology

## 2021-09-01 DIAGNOSIS — C50412 Malignant neoplasm of upper-outer quadrant of left female breast: Secondary | ICD-10-CM | POA: Diagnosis not present

## 2021-09-01 LAB — RAD ONC ARIA SESSION SUMMARY
Course Elapsed Days: 16
Plan Fractions Treated to Date: 12
Plan Fractions Treated to Date: 6
Plan Prescribed Dose Per Fraction: 1.8 Gy
Plan Prescribed Dose Per Fraction: 1.8 Gy
Plan Total Fractions Prescribed: 14
Plan Total Fractions Prescribed: 28
Plan Total Prescribed Dose: 25.2 Gy
Plan Total Prescribed Dose: 50.4 Gy
Reference Point Dosage Given to Date: 21.6 Gy
Reference Point Dosage Given to Date: 21.6 Gy
Reference Point Session Dosage Given: 1.8 Gy
Reference Point Session Dosage Given: 1.8 Gy
Session Number: 12

## 2021-09-02 ENCOUNTER — Ambulatory Visit
Admission: RE | Admit: 2021-09-02 | Discharge: 2021-09-02 | Disposition: A | Discharge status: Home / Self Care | Payer: BC Managed Care – PPO | Source: Ambulatory Visit | Attending: Radiation Oncology | Admitting: Radiation Oncology

## 2021-09-02 ENCOUNTER — Other Ambulatory Visit: Payer: Self-pay

## 2021-09-02 DIAGNOSIS — C50412 Malignant neoplasm of upper-outer quadrant of left female breast: Secondary | ICD-10-CM | POA: Diagnosis not present

## 2021-09-02 LAB — RAD ONC ARIA SESSION SUMMARY
Course Elapsed Days: 17
Plan Fractions Treated to Date: 13
Plan Fractions Treated to Date: 7
Plan Prescribed Dose Per Fraction: 1.8 Gy
Plan Prescribed Dose Per Fraction: 1.8 Gy
Plan Total Fractions Prescribed: 14
Plan Total Fractions Prescribed: 28
Plan Total Prescribed Dose: 25.2 Gy
Plan Total Prescribed Dose: 50.4 Gy
Reference Point Dosage Given to Date: 23.4 Gy
Reference Point Dosage Given to Date: 23.4 Gy
Reference Point Session Dosage Given: 1.8 Gy
Reference Point Session Dosage Given: 1.8 Gy
Session Number: 13

## 2021-09-03 ENCOUNTER — Other Ambulatory Visit: Payer: Self-pay

## 2021-09-03 ENCOUNTER — Ambulatory Visit
Admission: RE | Admit: 2021-09-03 | Discharge: 2021-09-03 | Disposition: A | Discharge status: Home / Self Care | Payer: BC Managed Care – PPO | Source: Ambulatory Visit | Attending: Radiation Oncology | Admitting: Radiation Oncology

## 2021-09-03 DIAGNOSIS — C50412 Malignant neoplasm of upper-outer quadrant of left female breast: Secondary | ICD-10-CM | POA: Diagnosis not present

## 2021-09-03 LAB — RAD ONC ARIA SESSION SUMMARY
Course Elapsed Days: 18
Plan Fractions Treated to Date: 14
Plan Fractions Treated to Date: 7
Plan Prescribed Dose Per Fraction: 1.8 Gy
Plan Prescribed Dose Per Fraction: 1.8 Gy
Plan Total Fractions Prescribed: 14
Plan Total Fractions Prescribed: 28
Plan Total Prescribed Dose: 25.2 Gy
Plan Total Prescribed Dose: 50.4 Gy
Reference Point Dosage Given to Date: 25.2 Gy
Reference Point Dosage Given to Date: 25.2 Gy
Reference Point Session Dosage Given: 1.8 Gy
Reference Point Session Dosage Given: 1.8 Gy
Session Number: 14

## 2021-09-06 ENCOUNTER — Ambulatory Visit: Payer: BC Managed Care – PPO

## 2021-09-07 ENCOUNTER — Other Ambulatory Visit: Payer: Self-pay

## 2021-09-07 ENCOUNTER — Ambulatory Visit
Admission: RE | Admit: 2021-09-07 | Discharge: 2021-09-07 | Disposition: A | Discharge status: Home / Self Care | Payer: BC Managed Care – PPO | Source: Ambulatory Visit | Attending: Radiation Oncology | Admitting: Radiation Oncology

## 2021-09-07 DIAGNOSIS — Z170 Estrogen receptor positive status [ER+]: Secondary | ICD-10-CM | POA: Insufficient documentation

## 2021-09-07 DIAGNOSIS — C50412 Malignant neoplasm of upper-outer quadrant of left female breast: Secondary | ICD-10-CM | POA: Insufficient documentation

## 2021-09-07 DIAGNOSIS — Z9011 Acquired absence of right breast and nipple: Secondary | ICD-10-CM | POA: Insufficient documentation

## 2021-09-07 DIAGNOSIS — Z510 Encounter for antineoplastic radiation therapy: Secondary | ICD-10-CM | POA: Insufficient documentation

## 2021-09-07 DIAGNOSIS — N390 Urinary tract infection, site not specified: Secondary | ICD-10-CM | POA: Insufficient documentation

## 2021-09-07 DIAGNOSIS — Z803 Family history of malignant neoplasm of breast: Secondary | ICD-10-CM | POA: Insufficient documentation

## 2021-09-07 LAB — RAD ONC ARIA SESSION SUMMARY
Course Elapsed Days: 22
Plan Fractions Treated to Date: 15
Plan Fractions Treated to Date: 8
Plan Prescribed Dose Per Fraction: 1.8 Gy
Plan Prescribed Dose Per Fraction: 1.8 Gy
Plan Total Fractions Prescribed: 14
Plan Total Fractions Prescribed: 28
Plan Total Prescribed Dose: 25.2 Gy
Plan Total Prescribed Dose: 50.4 Gy
Reference Point Dosage Given to Date: 27 Gy
Reference Point Dosage Given to Date: 27 Gy
Reference Point Session Dosage Given: 1.8 Gy
Reference Point Session Dosage Given: 1.8 Gy
Session Number: 15

## 2021-09-08 ENCOUNTER — Other Ambulatory Visit: Payer: Self-pay

## 2021-09-08 ENCOUNTER — Ambulatory Visit
Admission: RE | Admit: 2021-09-08 | Discharge: 2021-09-08 | Disposition: A | Discharge status: Home / Self Care | Payer: BC Managed Care – PPO | Source: Ambulatory Visit | Attending: Radiation Oncology | Admitting: Radiation Oncology

## 2021-09-08 DIAGNOSIS — C50412 Malignant neoplasm of upper-outer quadrant of left female breast: Secondary | ICD-10-CM | POA: Diagnosis not present

## 2021-09-08 LAB — RAD ONC ARIA SESSION SUMMARY
Course Elapsed Days: 23
Plan Fractions Treated to Date: 16
Plan Fractions Treated to Date: 8
Plan Prescribed Dose Per Fraction: 1.8 Gy
Plan Prescribed Dose Per Fraction: 1.8 Gy
Plan Total Fractions Prescribed: 14
Plan Total Fractions Prescribed: 28
Plan Total Prescribed Dose: 25.2 Gy
Plan Total Prescribed Dose: 50.4 Gy
Reference Point Dosage Given to Date: 28.8 Gy
Reference Point Dosage Given to Date: 28.8 Gy
Reference Point Session Dosage Given: 1.8 Gy
Reference Point Session Dosage Given: 1.8 Gy
Session Number: 16

## 2021-09-09 ENCOUNTER — Ambulatory Visit
Admission: RE | Admit: 2021-09-09 | Discharge: 2021-09-09 | Disposition: A | Discharge status: Home / Self Care | Payer: BC Managed Care – PPO | Source: Ambulatory Visit | Attending: Radiation Oncology | Admitting: Radiation Oncology

## 2021-09-09 ENCOUNTER — Other Ambulatory Visit: Payer: Self-pay

## 2021-09-09 DIAGNOSIS — C50412 Malignant neoplasm of upper-outer quadrant of left female breast: Secondary | ICD-10-CM | POA: Diagnosis not present

## 2021-09-09 LAB — RAD ONC ARIA SESSION SUMMARY
Course Elapsed Days: 24
Plan Fractions Treated to Date: 17
Plan Fractions Treated to Date: 9
Plan Prescribed Dose Per Fraction: 1.8 Gy
Plan Prescribed Dose Per Fraction: 1.8 Gy
Plan Total Fractions Prescribed: 14
Plan Total Fractions Prescribed: 28
Plan Total Prescribed Dose: 25.2 Gy
Plan Total Prescribed Dose: 50.4 Gy
Reference Point Dosage Given to Date: 30.6 Gy
Reference Point Dosage Given to Date: 30.6 Gy
Reference Point Session Dosage Given: 1.8 Gy
Reference Point Session Dosage Given: 1.8 Gy
Session Number: 17

## 2021-09-10 ENCOUNTER — Ambulatory Visit: Payer: BC Managed Care – PPO

## 2021-09-10 ENCOUNTER — Ambulatory Visit
Admission: RE | Admit: 2021-09-10 | Discharge: 2021-09-10 | Disposition: A | Discharge status: Home / Self Care | Payer: BC Managed Care – PPO | Source: Ambulatory Visit | Attending: Radiation Oncology | Admitting: Radiation Oncology

## 2021-09-10 ENCOUNTER — Ambulatory Visit: Payer: BC Managed Care – PPO | Admitting: Radiation Oncology

## 2021-09-10 ENCOUNTER — Other Ambulatory Visit: Payer: Self-pay

## 2021-09-10 DIAGNOSIS — Z170 Estrogen receptor positive status [ER+]: Secondary | ICD-10-CM

## 2021-09-10 DIAGNOSIS — C50412 Malignant neoplasm of upper-outer quadrant of left female breast: Secondary | ICD-10-CM | POA: Diagnosis not present

## 2021-09-10 LAB — RAD ONC ARIA SESSION SUMMARY
Course Elapsed Days: 25
Plan Fractions Treated to Date: 18
Plan Fractions Treated to Date: 9
Plan Prescribed Dose Per Fraction: 1.8 Gy
Plan Prescribed Dose Per Fraction: 1.8 Gy
Plan Total Fractions Prescribed: 14
Plan Total Fractions Prescribed: 28
Plan Total Prescribed Dose: 25.2 Gy
Plan Total Prescribed Dose: 50.4 Gy
Reference Point Dosage Given to Date: 32.4 Gy
Reference Point Dosage Given to Date: 32.4 Gy
Reference Point Session Dosage Given: 1.8 Gy
Reference Point Session Dosage Given: 1.8 Gy
Session Number: 18

## 2021-09-10 MED ORDER — RADIAPLEXRX EX GEL: Freq: Once | CUTANEOUS | Status: AC

## 2021-09-12 ENCOUNTER — Encounter: Payer: Self-pay | Admitting: Hematology

## 2021-09-13 ENCOUNTER — Ambulatory Visit
Admission: RE | Admit: 2021-09-13 | Discharge: 2021-09-13 | Disposition: A | Discharge status: Home / Self Care | Payer: BC Managed Care – PPO | Source: Ambulatory Visit | Attending: Radiation Oncology | Admitting: Radiation Oncology

## 2021-09-13 ENCOUNTER — Other Ambulatory Visit: Payer: Self-pay

## 2021-09-13 DIAGNOSIS — C50412 Malignant neoplasm of upper-outer quadrant of left female breast: Secondary | ICD-10-CM | POA: Diagnosis not present

## 2021-09-13 LAB — RAD ONC ARIA SESSION SUMMARY
Course Elapsed Days: 28
Plan Fractions Treated to Date: 10
Plan Fractions Treated to Date: 19
Plan Prescribed Dose Per Fraction: 1.8 Gy
Plan Prescribed Dose Per Fraction: 1.8 Gy
Plan Total Fractions Prescribed: 14
Plan Total Fractions Prescribed: 28
Plan Total Prescribed Dose: 25.2 Gy
Plan Total Prescribed Dose: 50.4 Gy
Reference Point Dosage Given to Date: 34.2 Gy
Reference Point Dosage Given to Date: 34.2 Gy
Reference Point Session Dosage Given: 1.8 Gy
Reference Point Session Dosage Given: 1.8 Gy
Session Number: 19

## 2021-09-14 ENCOUNTER — Other Ambulatory Visit: Payer: Self-pay

## 2021-09-14 ENCOUNTER — Ambulatory Visit
Admission: RE | Admit: 2021-09-14 | Discharge: 2021-09-14 | Disposition: A | Discharge status: Home / Self Care | Payer: BC Managed Care – PPO | Source: Ambulatory Visit | Attending: Radiation Oncology | Admitting: Radiation Oncology

## 2021-09-14 DIAGNOSIS — C50412 Malignant neoplasm of upper-outer quadrant of left female breast: Secondary | ICD-10-CM | POA: Diagnosis not present

## 2021-09-14 LAB — RAD ONC ARIA SESSION SUMMARY
Course Elapsed Days: 29
Plan Fractions Treated to Date: 10
Plan Fractions Treated to Date: 20
Plan Prescribed Dose Per Fraction: 1.8 Gy
Plan Prescribed Dose Per Fraction: 1.8 Gy
Plan Total Fractions Prescribed: 14
Plan Total Fractions Prescribed: 28
Plan Total Prescribed Dose: 25.2 Gy
Plan Total Prescribed Dose: 50.4 Gy
Reference Point Dosage Given to Date: 36 Gy
Reference Point Dosage Given to Date: 36 Gy
Reference Point Session Dosage Given: 1.8 Gy
Reference Point Session Dosage Given: 1.8 Gy
Session Number: 20

## 2021-09-15 ENCOUNTER — Other Ambulatory Visit: Payer: Self-pay

## 2021-09-15 ENCOUNTER — Ambulatory Visit: Payer: BC Managed Care – PPO | Attending: General Surgery | Admitting: Physical Therapy

## 2021-09-15 ENCOUNTER — Encounter: Payer: Self-pay | Admitting: Hematology

## 2021-09-15 ENCOUNTER — Ambulatory Visit
Admission: RE | Admit: 2021-09-15 | Discharge: 2021-09-15 | Disposition: A | Discharge status: Home / Self Care | Payer: BC Managed Care – PPO | Source: Ambulatory Visit | Attending: Radiation Oncology | Admitting: Radiation Oncology

## 2021-09-15 DIAGNOSIS — R279 Unspecified lack of coordination: Secondary | ICD-10-CM | POA: Diagnosis present

## 2021-09-15 DIAGNOSIS — M6281 Muscle weakness (generalized): Secondary | ICD-10-CM | POA: Diagnosis present

## 2021-09-15 DIAGNOSIS — M62838 Other muscle spasm: Secondary | ICD-10-CM | POA: Diagnosis present

## 2021-09-15 DIAGNOSIS — C50412 Malignant neoplasm of upper-outer quadrant of left female breast: Secondary | ICD-10-CM | POA: Diagnosis not present

## 2021-09-15 LAB — RAD ONC ARIA SESSION SUMMARY
Course Elapsed Days: 30
Plan Fractions Treated to Date: 11
Plan Fractions Treated to Date: 21
Plan Prescribed Dose Per Fraction: 1.8 Gy
Plan Prescribed Dose Per Fraction: 1.8 Gy
Plan Total Fractions Prescribed: 14
Plan Total Fractions Prescribed: 28
Plan Total Prescribed Dose: 25.2 Gy
Plan Total Prescribed Dose: 50.4 Gy
Reference Point Dosage Given to Date: 37.8 Gy
Reference Point Dosage Given to Date: 37.8 Gy
Reference Point Session Dosage Given: 1.8 Gy
Reference Point Session Dosage Given: 1.8 Gy
Session Number: 21

## 2021-09-15 NOTE — Therapy (Signed)
OUTPATIENT PHYSICAL THERAPY FEMALE PELVIC EVALUATION   Patient Name: Pamela Reyes MRN: [id number] DOB:May 06, 1980, 41 y.o., female Today's Date: 09/15/2021   PT End of Session - 09/15/21 1452     Visit Number 7   PF2   Number of Visits 8    Date for PT Re-Evaluation 11/26/21    Authorization Type BCBS    PT Start Time 1445    PT Stop Time 1528    PT Time Calculation (min) 43 min    Activity Tolerance Patient tolerated treatment well    Behavior During Therapy [facility] for tasks assessed/performed             Past Medical History:  Diagnosis Date   Anxiety    Breast cancer ([place])    Family history of breast cancer 11/11/2020   Fibroid 10/27/2020   Has 1 x .9 x .6 cm subserosal fibroid   History of hiatal hernia    PONV (postoperative nausea and vomiting)    Past Surgical History:  Procedure Laterality Date   AXILLARY LYMPH NODE DISSECTION Left 07/07/2021   Procedure: LEFT AXILLARY LYMPH NODE DISSECTION;  Surgeon: Stark Klein, MD;  Location: [place];  Service: General;  Laterality: Left;   BREAST RECONSTRUCTION WITH PLACEMENT OF TISSUE EXPANDER AND FLEX HD (ACELLULAR HYDRATED DERMIS) Left 01/06/2021   Procedure: LEFT BREAST RECONSTRUCTION WITH PLACEMENT OF TISSUE EXPANDER AND ACELLULAR  DERMIS;  Surgeon: Irene Limbo, MD;  Location: [place];  Service: Plastics;  Laterality: Left;   MASTECTOMY W/ SENTINEL NODE BIOPSY Left 01/06/2021   Procedure: LEFT MASTECTOMY WITH MAGTRACE;  Surgeon: Stark Klein, MD;  Location: [place];  Service: General;  Laterality: Left;   PORT-A-CATH REMOVAL Right 07/07/2021   Procedure: REMOVAL PORT-A-CATH;  Surgeon: Stark Klein, MD;  Location: [place];  Service: General;  Laterality: Right;   PORTACATH PLACEMENT Right 01/19/2021   Procedure: INSERTION PORT-A-CATH;  Surgeon: Stark Klein, MD;  Location: [place];  Service: General;  Laterality: Right;   RADIOACTIVE SEED GUIDED AXILLARY SENTINEL LYMPH NODE Left  01/06/2021   Procedure: RADIOACTIVE SEED GUIDED SENTINEL LYMPH NODE BIOPSY;  Surgeon: Stark Klein, MD;  Location: [place];  Service: General;  Laterality: Left;   SENTINEL NODE BIOPSY Left 01/06/2021   Procedure: LEFT AXILLARY SENTINEL LYMPH  NODE BIOPSY;  Surgeon: Stark Klein, MD;  Location: [place];  Service: General;  Laterality: Left;   WISDOM TOOTH EXTRACTION     Patient Active Problem List   Diagnosis Date Noted   Paresthesia 06/08/2021   Port-A-Cath in place 03/25/2021   Encounter for screening fecal occult blood testing 02/10/2021   Encounter for gynecological examination with Papanicolaou smear of cervix 02/10/2021   Axillary pain, right 01/18/2021   Genetic testing 11/25/2020   Family history of breast cancer 11/11/2020   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 lymph node, left ([place]) 11/11/2020   Dense breasts 11/11/2020   Malignant neoplasm of upper-outer quadrant of left breast in female, estrogen receptor positive ([place]) 11/10/2020   Fibroid 10/27/2020   LLQ pain 10/06/2020   Left breast mass 10/06/2020   Breast pain, left 10/06/2020    PCP: Maximiano Coss, NP  REFERRING PROVIDER: Stark Klein, MD  REFERRING DIAG: [phone] (ICD-10-CM) - Malignant neoplasm of upper-outer quadrant of left female breast C50.912 (ICD-10-CM) - Malignant neoplasm of unspecified site of left female breast N89.8 (ICD-10-CM) - Other specified noninflammatory disorders of vagina  THERAPY DIAG:  Muscle weakness (generalized)  Unspecified lack of coordination  Other muscle  spasm  Rationale for Evaluation and Treatment Rehabilitation  ONSET DATE: 2-3 months ago  SUBJECTIVE:                                                                                                                                                                                           SUBJECTIVE STATEMENT: Pt aggravated about having a UTI and having pain with this but other than this has been ok. Outside of this, pt  has had intercourse and reports this is less painful but much more manageable.   Fluid intake: Yes: 64 oz     PAIN:  Are you having pain? No   PRECAUTIONS: actively having CA radiation treatment  WEIGHT BEARING RESTRICTIONS No  FALLS:  Has patient fallen in last 6 months? No  LIVING ENVIRONMENT: Lives with: lives with their spouse Lives in: House/apartment   OCCUPATION: used book Training and development officer  PLOF: Independent  PATIENT GOALS to have less pain at vaginal opening  PERTINENT HISTORY:  underwent left mastectomy on 01/06/21, 4 cycles AC 02/11/21 - 03/25/21,  additional 12 cycles 06/24/21, left ALND on 07/07/21 with Dr. Barry Dienes and had one out of 11 nodes positive,  Sexual abuse: No  BOWEL MOVEMENT Pain with bowel movement: No Type of bowel movement:Type (B[place] Stool Scale) 1, Frequency every 2-3 days, and Strain No Fully empty rectum: Yes:   Leakage: No Pads: No Fiber supplement: No  URINATION Pain with urination: No Fully empty bladder: No Stream: Strong Urgency: No Frequency: not quicker than every 2 hours Leakage:  no Pads: No  INTERCOURSE Pain with intercourse: Initial Penetration, During Penetration, and During Climax Ability to have vaginal penetration:  Yes: but painful Climax: painful  Marinoff Scale: 2/3  PREGNANCY Vaginal deliveries 0 Tearing No C-section deliveries 0 Currently pregnant No  PROLAPSE None    OBJECTIVE:   DIAGNOSTIC FINDINGS:    COGNITION:  Overall cognitive status: Within functional limits for tasks assessed     SENSATION:  Light touch: Appears intact  Proprioception: Appears intact  MUSCLE LENGTH: Bil hamstrings and adductors limited by 25%                POSTURE: rounded shoulders  LUMBARAROM/PROM  A/PROM A/PROM  eval  Flexion Limited by 75%  Extension WFL  Right lateral flexion WFL  Left lateral flexion WFL  Right rotation Limited by 50%  Left rotation Limited by 50%   (Blank rows = not tested)  LOWER  EXTREMITY ROM:  WFL  LOWER EXTREMITY MMT:  Hips grossly 4+/5; knees and ankles 5/5   PALPATION:   General  no TTP but mild fascial restrictions at abdomen in  all quadrants                External Perineal Exam no TTP but did have mild dryness                              Internal Pelvic Floor mild TTP throughout pelvic floor at superficial layers and deep layers, mildly worse at Rt side compared to lt. Pt denied pain but reports she could feel the pressure/tension.   Patient confirms identification and approves PT to assess internal pelvic floor and treatment Yes  PELVIC MMT:   MMT eval  Vaginal 5/5; 9s isometrics; 8 reps  Internal Anal Sphincter   External Anal Sphincter   Puborectalis   Diastasis Recti   (Blank rows = not tested)        TONE: WFL  PROLAPSE: Not seen in hooklying   TODAY'S TREATMENT   09/15/2021:  Manual work: Internal vaginal assessment as she reports she had improvement with pain with intercourse but still there and wants to see if this can be helped with releasing muscle tension. Pt did have tightness at Lt obturator internus and iliococcygeus. Released well with gentle overpressure and pt reports she felt better with not having any TTP at these muscles. This was overall better than eval. Pt educated on muscle relaxation techniques, breathing mechanics, and lubricant use for intercourse. Pt reports moisturizers have been helpful overall and has been having a lot less pain/dryness and less pain with intercourse however does still have some. Pt also educated on vaginal dilators use at home and agreed to attempt.    EVAL Examination completed, findings reviewed, pt educated on POC, HEP, and feminine moisturizers and lubricants and pt has already purchases vaginal dilators and requested information on how to use this was given and discussed. However pt educated on attempting moisturizers and HEP first to see if these help. Pt motivated to participate in PT and  agreeable to attempt recommendations.     PATIENT EDUCATION:  Education details: [id number] Person educated: Patient Education method: Consulting civil engineer, Demonstration, Corporate treasurer cues, Verbal cues, and Handouts Education comprehension: verbalized understanding and returned demonstration   HOME EXERCISE PROGRAM: [id number]  ASSESSMENT:  CLINICAL IMPRESSION: Patient session focused on internal manual work to decrease tension at pelvic floor for decreased pain with intercourse, education of dilators, lubricnat and moisturizer in more depth. Pt denied additional questions. Pt did have less tension at internal pelvic floor than eval with less TTP as well however did still have tension at Lt side and this released well. Pt would benefit from additional PT to further address deficits.     OBJECTIVE IMPAIRMENTS decreased coordination, decreased endurance, increased fascial restrictions, impaired flexibility, improper body mechanics, postural dysfunction, and pain.   ACTIVITY LIMITATIONS  intercourse  PARTICIPATION LIMITATIONS: interpersonal relationship  PERSONAL FACTORS Time since onset of injury/illness/exacerbation and 1 comorbidity: medical history  are also affecting patient's functional outcome.   REHAB POTENTIAL: Good  CLINICAL DECISION MAKING: Stable/uncomplicated  EVALUATION COMPLEXITY: Low   GOALS: Goals reviewed with patient? Yes  SHORT TERM GOALS: Target date: 09/23/2021  Pt to be I with HEP.  Baseline: Goal status: INITIAL  2.  Pt to be I with abdominal massage and pelvic relaxation techniques for improved pain levels.  Baseline:  Goal status: INITIAL  3.  Pt to report no more than 5/10 pain with intercourse for improved tolerance to vaginal penetration.  Baseline:  Goal status: INITIAL   LONG  TERM GOALS: Target date:  11/26/21    Pt to be I with advanced HEP.  Baseline:  Goal status: INITIAL  2.  Pt to report no more than 2/10 pain with vaginal penetration for  improved tolerance to intercourse and medical exams.  Baseline:  Goal status: INITIAL  3.  Pt to be I with moisturizer use and report improvement of vaginal dryness by at least 50% for improved QOL.  Baseline:  Goal status: INITIAL  PLAN: PT FREQUENCY: every other week  PT DURATION:  6 sessions  PLANNED INTERVENTIONS: Therapeutic exercises, Therapeutic activity, Neuromuscular re-education, Patient/Family education, Self Care, Joint mobilization, Dry Needling, Spinal mobilization, Cryotherapy, Moist heat, Taping, Biofeedback, and Manual therapy  PLAN FOR NEXT SESSION: breathing mechanics, pelvic relaxation, voiding mechanics, stretching of hips/back  Stacy Gardner, PT, DPT 08/09/234:26 PM

## 2021-09-16 ENCOUNTER — Other Ambulatory Visit: Payer: BC Managed Care – PPO

## 2021-09-16 ENCOUNTER — Ambulatory Visit: Payer: BC Managed Care – PPO | Admitting: Nurse Practitioner

## 2021-09-16 ENCOUNTER — Other Ambulatory Visit: Payer: Self-pay

## 2021-09-16 ENCOUNTER — Inpatient Hospital Stay: Payer: BC Managed Care – PPO

## 2021-09-16 ENCOUNTER — Ambulatory Visit
Admission: RE | Admit: 2021-09-16 | Discharge: 2021-09-16 | Disposition: A | Discharge status: Home / Self Care | Payer: BC Managed Care – PPO | Source: Ambulatory Visit | Attending: Radiation Oncology | Admitting: Radiation Oncology

## 2021-09-16 DIAGNOSIS — Z923 Personal history of irradiation: Secondary | ICD-10-CM | POA: Insufficient documentation

## 2021-09-16 DIAGNOSIS — Z5111 Encounter for antineoplastic chemotherapy: Secondary | ICD-10-CM | POA: Insufficient documentation

## 2021-09-16 DIAGNOSIS — Z803 Family history of malignant neoplasm of breast: Secondary | ICD-10-CM | POA: Insufficient documentation

## 2021-09-16 DIAGNOSIS — G629 Polyneuropathy, unspecified: Secondary | ICD-10-CM | POA: Insufficient documentation

## 2021-09-16 DIAGNOSIS — Z95828 Presence of other vascular implants and grafts: Secondary | ICD-10-CM

## 2021-09-16 DIAGNOSIS — Z170 Estrogen receptor positive status [ER+]: Secondary | ICD-10-CM | POA: Insufficient documentation

## 2021-09-16 DIAGNOSIS — C50412 Malignant neoplasm of upper-outer quadrant of left female breast: Secondary | ICD-10-CM | POA: Insufficient documentation

## 2021-09-16 DIAGNOSIS — D709 Neutropenia, unspecified: Secondary | ICD-10-CM | POA: Insufficient documentation

## 2021-09-16 DIAGNOSIS — C773 Secondary and unspecified malignant neoplasm of axilla and upper limb lymph nodes: Secondary | ICD-10-CM | POA: Insufficient documentation

## 2021-09-16 DIAGNOSIS — N390 Urinary tract infection, site not specified: Secondary | ICD-10-CM | POA: Insufficient documentation

## 2021-09-16 LAB — RAD ONC ARIA SESSION SUMMARY
Course Elapsed Days: 31
Plan Fractions Treated to Date: 11
Plan Fractions Treated to Date: 22
Plan Prescribed Dose Per Fraction: 1.8 Gy
Plan Prescribed Dose Per Fraction: 1.8 Gy
Plan Total Fractions Prescribed: 14
Plan Total Fractions Prescribed: 28
Plan Total Prescribed Dose: 25.2 Gy
Plan Total Prescribed Dose: 50.4 Gy
Reference Point Dosage Given to Date: 39.6 Gy
Reference Point Dosage Given to Date: 39.6 Gy
Reference Point Session Dosage Given: 1.8 Gy
Reference Point Session Dosage Given: 1.8 Gy
Session Number: 22

## 2021-09-16 MED ORDER — GOSERELIN ACETATE 3.6 MG ~~LOC~~ IMPL
3.6000 mg | DRUG_IMPLANT | Freq: Once | SUBCUTANEOUS | Status: AC
  Administered 2021-09-16: 3.6 mg via SUBCUTANEOUS
  Filled 2021-09-16: qty 3.6

## 2021-09-17 ENCOUNTER — Ambulatory Visit
Admission: RE | Admit: 2021-09-17 | Discharge: 2021-09-17 | Disposition: A | Discharge status: Home / Self Care | Payer: BC Managed Care – PPO | Source: Ambulatory Visit | Attending: Radiation Oncology | Admitting: Radiation Oncology

## 2021-09-17 ENCOUNTER — Ambulatory Visit: Payer: BC Managed Care – PPO | Admitting: Radiation Oncology

## 2021-09-17 ENCOUNTER — Other Ambulatory Visit: Payer: Self-pay

## 2021-09-17 DIAGNOSIS — C50412 Malignant neoplasm of upper-outer quadrant of left female breast: Secondary | ICD-10-CM | POA: Diagnosis not present

## 2021-09-17 LAB — RAD ONC ARIA SESSION SUMMARY
Course Elapsed Days: 32
Plan Fractions Treated to Date: 12
Plan Fractions Treated to Date: 23
Plan Prescribed Dose Per Fraction: 1.8 Gy
Plan Prescribed Dose Per Fraction: 1.8 Gy
Plan Total Fractions Prescribed: 14
Plan Total Fractions Prescribed: 28
Plan Total Prescribed Dose: 25.2 Gy
Plan Total Prescribed Dose: 50.4 Gy
Reference Point Dosage Given to Date: 41.4 Gy
Reference Point Dosage Given to Date: 41.4 Gy
Reference Point Session Dosage Given: 1.8 Gy
Reference Point Session Dosage Given: 1.8 Gy
Session Number: 23

## 2021-09-20 ENCOUNTER — Other Ambulatory Visit: Payer: Self-pay

## 2021-09-20 ENCOUNTER — Ambulatory Visit
Admission: RE | Admit: 2021-09-20 | Discharge: 2021-09-20 | Disposition: A | Discharge status: Home / Self Care | Payer: BC Managed Care – PPO | Source: Ambulatory Visit | Attending: Radiation Oncology | Admitting: Radiation Oncology

## 2021-09-20 ENCOUNTER — Ambulatory Visit
Admission: RE | Admit: 2021-09-20 | Payer: BC Managed Care – PPO | Source: Ambulatory Visit | Admitting: Radiation Oncology

## 2021-09-20 DIAGNOSIS — C50412 Malignant neoplasm of upper-outer quadrant of left female breast: Secondary | ICD-10-CM | POA: Diagnosis not present

## 2021-09-20 LAB — RAD ONC ARIA SESSION SUMMARY
Course Elapsed Days: 35
Plan Fractions Treated to Date: 12
Plan Fractions Treated to Date: 24
Plan Prescribed Dose Per Fraction: 1.8 Gy
Plan Prescribed Dose Per Fraction: 1.8 Gy
Plan Total Fractions Prescribed: 14
Plan Total Fractions Prescribed: 28
Plan Total Prescribed Dose: 25.2 Gy
Plan Total Prescribed Dose: 50.4 Gy
Reference Point Dosage Given to Date: 43.2 Gy
Reference Point Dosage Given to Date: 43.2 Gy
Reference Point Session Dosage Given: 1.8 Gy
Reference Point Session Dosage Given: 1.8 Gy
Session Number: 24

## 2021-09-21 ENCOUNTER — Ambulatory Visit
Admission: RE | Admit: 2021-09-21 | Discharge: 2021-09-21 | Disposition: A | Discharge status: Home / Self Care | Payer: BC Managed Care – PPO | Source: Ambulatory Visit | Attending: Radiation Oncology | Admitting: Radiation Oncology

## 2021-09-21 ENCOUNTER — Ambulatory Visit: Payer: BC Managed Care – PPO | Admitting: Radiation Oncology

## 2021-09-21 ENCOUNTER — Other Ambulatory Visit: Payer: Self-pay

## 2021-09-21 DIAGNOSIS — C50412 Malignant neoplasm of upper-outer quadrant of left female breast: Secondary | ICD-10-CM | POA: Diagnosis not present

## 2021-09-21 LAB — RAD ONC ARIA SESSION SUMMARY
Course Elapsed Days: 36
Plan Fractions Treated to Date: 13
Plan Fractions Treated to Date: 25
Plan Prescribed Dose Per Fraction: 1.8 Gy
Plan Prescribed Dose Per Fraction: 1.8 Gy
Plan Total Fractions Prescribed: 14
Plan Total Fractions Prescribed: 28
Plan Total Prescribed Dose: 25.2 Gy
Plan Total Prescribed Dose: 50.4 Gy
Reference Point Dosage Given to Date: 45 Gy
Reference Point Dosage Given to Date: 45 Gy
Reference Point Session Dosage Given: 1.8 Gy
Reference Point Session Dosage Given: 1.8 Gy
Session Number: 25

## 2021-09-22 ENCOUNTER — Encounter: Payer: Self-pay | Admitting: Radiation Oncology

## 2021-09-22 ENCOUNTER — Other Ambulatory Visit: Payer: Self-pay

## 2021-09-22 ENCOUNTER — Other Ambulatory Visit: Payer: Self-pay | Admitting: Radiation Oncology

## 2021-09-22 ENCOUNTER — Ambulatory Visit
Admission: RE | Admit: 2021-09-22 | Discharge: 2021-09-22 | Disposition: A | Discharge status: Home / Self Care | Payer: BC Managed Care – PPO | Source: Ambulatory Visit | Attending: Radiation Oncology | Admitting: Radiation Oncology

## 2021-09-22 DIAGNOSIS — C50412 Malignant neoplasm of upper-outer quadrant of left female breast: Secondary | ICD-10-CM | POA: Diagnosis not present

## 2021-09-22 LAB — RAD ONC ARIA SESSION SUMMARY
Course Elapsed Days: 37
Plan Fractions Treated to Date: 13
Plan Fractions Treated to Date: 26
Plan Prescribed Dose Per Fraction: 1.8 Gy
Plan Prescribed Dose Per Fraction: 1.8 Gy
Plan Total Fractions Prescribed: 14
Plan Total Fractions Prescribed: 28
Plan Total Prescribed Dose: 25.2 Gy
Plan Total Prescribed Dose: 50.4 Gy
Reference Point Dosage Given to Date: 46.8 Gy
Reference Point Dosage Given to Date: 46.8 Gy
Reference Point Session Dosage Given: 1.8 Gy
Reference Point Session Dosage Given: 1.8 Gy
Session Number: 26

## 2021-09-22 MED ORDER — HYDROXYZINE HCL 10 MG PO TABS: 10.0000 mg | ORAL_TABLET | Freq: Three times a day (TID) | ORAL | 0 refills | Status: DC | PRN

## 2021-09-22 NOTE — Progress Notes (Signed)
I saw the patient today due to pruritis in the treatment area. 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 She's received 26 of the planned 33 fractions. Her boost phase will begin next Monday. She's had intact skin up until this point. She take 12.5 mg benadryl prn for sleep, usually every other night, and takes 10 mg of Claritin each day. She does have pepcid she takes at times but has not been taking this recently. She's used topical radioplex and topical Hydrocortisone 1% OTC without relief. She's been taking Cipro for a UTI and has one more day of therapy to complete.   On exam she has diffuse erythema along the left chest wall and lower axilla with an in situ tissue expander. She has very early appearing changes along the axilla and inframammary fold with dry desquamation. No punctate changes are noted. No excoriations are noted. No other visible rashes are appreciated in the upper trunk or upper extremities.    We discussed continuation of Hydrocortisone cream, switching to Sonafine, continuing Claritin, resuming pepcid, and adding Hydroxyzine in place of Benadryl. She is aware of the side effect profile and should not drive while taking Hydroxyzine. I encouraged her to keep [place] informed. If her skin takes on a moist desquamation picture, we could switch her cream to Silvadene, and also if pruritis persists, consider a steroid dose pack.      Carola Rhine, PAC

## 2021-09-23 ENCOUNTER — Ambulatory Visit
Admission: RE | Admit: 2021-09-23 | Discharge: 2021-09-23 | Disposition: A | Discharge status: Home / Self Care | Payer: BC Managed Care – PPO | Source: Ambulatory Visit | Attending: Radiation Oncology | Admitting: Radiation Oncology

## 2021-09-23 ENCOUNTER — Ambulatory Visit: Payer: BC Managed Care – PPO | Admitting: Radiation Oncology

## 2021-09-23 ENCOUNTER — Ambulatory Visit: Payer: BC Managed Care – PPO

## 2021-09-23 ENCOUNTER — Other Ambulatory Visit: Payer: Self-pay

## 2021-09-23 DIAGNOSIS — C50412 Malignant neoplasm of upper-outer quadrant of left female breast: Secondary | ICD-10-CM | POA: Diagnosis not present

## 2021-09-23 LAB — RAD ONC ARIA SESSION SUMMARY
Course Elapsed Days: 38
Plan Fractions Treated to Date: 14
Plan Fractions Treated to Date: 27
Plan Prescribed Dose Per Fraction: 1.8 Gy
Plan Prescribed Dose Per Fraction: 1.8 Gy
Plan Total Fractions Prescribed: 14
Plan Total Fractions Prescribed: 28
Plan Total Prescribed Dose: 25.2 Gy
Plan Total Prescribed Dose: 50.4 Gy
Reference Point Dosage Given to Date: 48.6 Gy
Reference Point Dosage Given to Date: 48.6 Gy
Reference Point Session Dosage Given: 1.8 Gy
Reference Point Session Dosage Given: 1.8 Gy
Session Number: 27

## 2021-09-24 ENCOUNTER — Ambulatory Visit: Payer: BC Managed Care – PPO

## 2021-09-24 ENCOUNTER — Ambulatory Visit: Payer: BC Managed Care – PPO | Admitting: Radiation Oncology

## 2021-09-27 ENCOUNTER — Other Ambulatory Visit: Payer: Self-pay

## 2021-09-27 ENCOUNTER — Ambulatory Visit: Payer: BC Managed Care – PPO | Admitting: Radiation Oncology

## 2021-09-27 ENCOUNTER — Encounter: Payer: Self-pay | Admitting: Family

## 2021-09-27 ENCOUNTER — Ambulatory Visit (INDEPENDENT_AMBULATORY_CARE_PROVIDER_SITE_OTHER): Payer: BC Managed Care – PPO | Admitting: Family

## 2021-09-27 ENCOUNTER — Ambulatory Visit
Admission: RE | Admit: 2021-09-27 | Discharge: 2021-09-27 | Disposition: A | Discharge status: Home / Self Care | Payer: BC Managed Care – PPO | Source: Ambulatory Visit | Attending: Radiation Oncology | Admitting: Radiation Oncology

## 2021-09-27 VITALS — BP 118/79 | HR 88 | Temp 98.2°F | Ht 64.0 in | Wt 128.0 lb

## 2021-09-27 DIAGNOSIS — G8929 Other chronic pain: Secondary | ICD-10-CM | POA: Diagnosis not present

## 2021-09-27 DIAGNOSIS — C50412 Malignant neoplasm of upper-outer quadrant of left female breast: Secondary | ICD-10-CM | POA: Diagnosis not present

## 2021-09-27 DIAGNOSIS — N390 Urinary tract infection, site not specified: Secondary | ICD-10-CM | POA: Diagnosis not present

## 2021-09-27 DIAGNOSIS — C50912 Malignant neoplasm of unspecified site of left female breast: Secondary | ICD-10-CM | POA: Diagnosis not present

## 2021-09-27 DIAGNOSIS — C773 Secondary and unspecified malignant neoplasm of axilla and upper limb lymph nodes: Secondary | ICD-10-CM

## 2021-09-27 DIAGNOSIS — R109 Unspecified abdominal pain: Secondary | ICD-10-CM

## 2021-09-27 LAB — RAD ONC ARIA SESSION SUMMARY
Course Elapsed Days: 42
Plan Fractions Treated to Date: 14
Plan Fractions Treated to Date: 28
Plan Prescribed Dose Per Fraction: 1.8 Gy
Plan Prescribed Dose Per Fraction: 1.8 Gy
Plan Total Fractions Prescribed: 14
Plan Total Fractions Prescribed: 28
Plan Total Prescribed Dose: 25.2 Gy
Plan Total Prescribed Dose: 50.4 Gy
Reference Point Dosage Given to Date: 50.4 Gy
Reference Point Dosage Given to Date: 50.4 Gy
Reference Point Session Dosage Given: 1.8 Gy
Reference Point Session Dosage Given: 1.8 Gy
Session Number: 28

## 2021-09-27 MED ORDER — NITROFURANTOIN MACROCRYSTAL 50 MG PO CAPS: 50.0000 mg | ORAL_CAPSULE | ORAL | 2 refills | Status: DC

## 2021-09-27 NOTE — Assessment & Plan Note (Signed)
   pt states pain starting prior to her breast ca dx  off and on, she thinks d/t stress  non-palpable pain, not related to UTI, non-muscular  will check U/S

## 2021-09-27 NOTE — Progress Notes (Signed)
New Patient Office Visit  Subjective:  Patient ID: Pamela Reyes, female    DOB: [birth year]  Age: 41 y.o. MRN: [id number]  CC:  Chief Complaint  Patient presents with   Transfer of Care    Pt states she is active with chemo meds and she has had recurrent UTI's due to medications she has to take. Pt would like to know how she can prevent them.    Flank Pain    Pt c/o sharp/achy pains on left side. Pain comes and goes. Present for a couple months. Has tried tylenol, which does help the pain.     HPI Pamela Reyes presents for establishing care today.  Frequent UTIs:  reports having had about 4 UTIs lifetime, but last week was really bad, seen in UC, painful hematuria, dysuria, given pyridium, and Macrobid, reports happening a day or 2 after intercourse, wanting to see if anything can be done prophylactically.  Flank pain:  left side, below ribs, started prior to last year & breast ca dx, no palpable tenderness, no swelling, no palpable mass. Pt reports having occasional pain, dull ache, thinks it is psychosomatic when she is stressed, but due to having recently been dx with breast ca and still undergoing tx she is mildly concerned.   Assessment & Plan:   Problem List Items Addressed This Visit       Other   Left flank pain, chronic - Primary    pt states pain starting prior to her breast ca dx off and on, she thinks d/t stress non-palpable pain, not related to UTI, non-muscular will check U/S      Relevant Orders   US Abdomen Limited   Other Visit Diagnoses     Postcoital UTI    - most recent UTI last week, treated with Nitrofurantoin, sending prophylactic dose to take, advised on use & SE.    Relevant Medications   nitrofurantoin (MACRODANTIN) 50 MG capsule      Subjective:    Outpatient Medications Prior to Visit  Medication Sig Dispense Refill   acetaminophen (TYLENOL) 500 MG tablet Take 500-1,000 mg by mouth every 6 (six) hours as needed for moderate pain or  headache.     Cholecalciferol (DIALYVITE VITAMIN D 5000) 125 MCG (5000 UT) capsule Take 5,000 Units by mouth daily. Vit D and K supplement combined     famotidine (PEPCID) 10 MG tablet Take 10 mg by mouth daily as needed for heartburn or indigestion.     Goserelin Acetate (ZOLADEX [place]) Inject into the skin every 30 (thirty) days.     hydrOXYzine (ATARAX) 10 MG tablet Take 1 tablet (10 mg total) by mouth 3 (three) times daily as needed. Start at night time due to risks of drowsiness. 30 tablet 0   letrozole (FEMARA) 2.5 MG tablet Take 1 tablet (2.5 mg total) by mouth daily. 30 tablet 3   loratadine (CLARITIN) 10 MG tablet Take 10 mg by mouth daily.     tretinoin (RETIN-A) 0.025 % cream Apply 1 application. topically at bedtime.     No facility-administered medications prior to visit.   Past Medical History:  Diagnosis Date   Anxiety    Axillary pain, right 01/18/2021   Will get right axillary ultrasound.   Breast cancer ([place])    Breast pain, left 10/06/2020   Encounter for gynecological examination with Papanicolaou smear of cervix 02/10/2021   Encounter for screening fecal occult blood testing 02/10/2021   Family history of breast cancer 11/11/2020  Fibroid 10/27/2020   Has 1 x .9 x .6 cm subserosal fibroid   History of hiatal hernia    Left breast mass 10/06/2020   +breast cancer   LLQ pain 10/06/2020   PONV (postoperative nausea and vomiting)    Port-A-Cath in place 03/25/2021   Past Surgical History:  Procedure Laterality Date   AXILLARY LYMPH NODE DISSECTION Left 07/07/2021   Procedure: LEFT AXILLARY LYMPH NODE DISSECTION;  Surgeon: Stark Klein, MD;  Location: [place];  Service: General;  Laterality: Left;   BREAST RECONSTRUCTION WITH PLACEMENT OF TISSUE EXPANDER AND FLEX HD (ACELLULAR HYDRATED DERMIS) Left 01/06/2021   Procedure: LEFT BREAST RECONSTRUCTION WITH PLACEMENT OF TISSUE EXPANDER AND ACELLULAR  DERMIS;  Surgeon: Irene Limbo, MD;  Location: [place];   Service: Plastics;  Laterality: Left;   MASTECTOMY W/ SENTINEL NODE BIOPSY Left 01/06/2021   Procedure: LEFT MASTECTOMY WITH MAGTRACE;  Surgeon: Stark Klein, MD;  Location: [place];  Service: General;  Laterality: Left;   PORT-A-CATH REMOVAL Right 07/07/2021   Procedure: REMOVAL PORT-A-CATH;  Surgeon: Stark Klein, MD;  Location: [place];  Service: General;  Laterality: Right;   PORTACATH PLACEMENT Right 01/19/2021   Procedure: INSERTION PORT-A-CATH;  Surgeon: Stark Klein, MD;  Location: [place];  Service: General;  Laterality: Right;   RADIOACTIVE SEED GUIDED AXILLARY SENTINEL LYMPH NODE Left 01/06/2021   Procedure: RADIOACTIVE SEED GUIDED SENTINEL LYMPH NODE BIOPSY;  Surgeon: Stark Klein, MD;  Location: [place];  Service: General;  Laterality: Left;   SENTINEL NODE BIOPSY Left 01/06/2021   Procedure: LEFT AXILLARY SENTINEL LYMPH  NODE BIOPSY;  Surgeon: Stark Klein, MD;  Location: [place];  Service: General;  Laterality: Left;   WISDOM TOOTH EXTRACTION      Objective:   Today's Vitals: BP 118/79 (BP Location: Left Arm, Patient Position: Sitting, Cuff Size: Large)   Pulse 88   Temp 98.2 F (36.8 C) (Temporal)   Ht '5\' 4"'$  (1.626 m)   Wt 128 lb (58.1 kg)   LMP  (LMP Unknown)   SpO2 98%   BMI 21.97 kg/m   Physical Exam Vitals and nursing note reviewed.  Constitutional:      Appearance: Normal appearance.  Cardiovascular:     Rate and Rhythm: Normal rate and regular rhythm.  Pulmonary:     Effort: Pulmonary effort is normal.     Breath sounds: Normal breath sounds.  Musculoskeletal:        General: Normal range of motion.  Skin:    General: Skin is warm and dry.  Neurological:     Mental Status: She is alert.  Psychiatric:        Mood and Affect: Mood normal.        Behavior: Behavior normal.     Meds ordered this encounter  Medications   nitrofurantoin (MACRODANTIN) 50 MG capsule    Sig: Take 1 capsule (50 mg total) by mouth as directed. Take 1 hour  prior or immediately after sexual intercourse.    Dispense:  20 capsule    Refill:  2    Order Specific Question:   Supervising Provider    Answer:   ANDY, CAMILLE L [[id number]]    Jeanie Sewer, NP

## 2021-09-27 NOTE — Patient Instructions (Signed)
Welcome to [facility], It was a pleasure meeting you today!   For your side pain, you can try applying heat up to 20 minutes 3 times/day, and/or apply over the counter Lidocaine patches. I have sent an order to the radiology department at our hospital in [place], they will call you directly. I will review the results via MyChart after the radiologist has reviewed.  I have sent over the low dose Nitrofurantoin antibiotic pills to take either 1 hour before or immediately after you have intercourse to help prevent a UTI.  Remember to urinate immediately after intercourse and drink at least 2 Liters of water every day for overall good urinary health.     PLEASE NOTE: If you had any LAB tests please let us know if you have not heard back within a few days. You may see your results on MyChart before we have a chance to review them but we will give you a call once they are reviewed by [place]. If we ordered any REFERRALS today, please let us know if you have not heard from their office within the next week.  Let us know through MyChart if you are needing REFILLS, or have your pharmacy send [place] the request. You can also use MyChart to communicate with me or any office staff.   Please try these tips to maintain a healthy lifestyle:  Eat most of your calories during the day when you are active. Eliminate processed foods including packaged sweets (pies, cakes, cookies), reduce intake of potatoes, white bread, white pasta, and white rice. Look for whole grain options, oat flour or almond flour.  Each meal should contain half fruits/vegetables, one quarter protein, and one quarter carbs (no bigger than a computer mouse).  Cut down on sweet beverages. This includes juice, soda, and sweet tea. Also watch fruit intake, though this is a healthier sweet option, it still contains natural sugar! Limit to 3 servings daily.  Drink at least 1 8oz. glass of water with each meal and aim  for at least 8 glasses per day  Exercise at least 150 minutes every week.

## 2021-09-28 ENCOUNTER — Ambulatory Visit: Payer: BC Managed Care – PPO | Admitting: Radiation Oncology

## 2021-09-28 ENCOUNTER — Ambulatory Visit
Admission: RE | Admit: 2021-09-28 | Discharge: 2021-09-28 | Disposition: A | Discharge status: Home / Self Care | Payer: BC Managed Care – PPO | Source: Ambulatory Visit | Attending: Radiation Oncology | Admitting: Radiation Oncology

## 2021-09-28 ENCOUNTER — Other Ambulatory Visit: Payer: Self-pay

## 2021-09-28 ENCOUNTER — Ambulatory Visit: Payer: BC Managed Care – PPO

## 2021-09-28 DIAGNOSIS — C50412 Malignant neoplasm of upper-outer quadrant of left female breast: Secondary | ICD-10-CM | POA: Diagnosis not present

## 2021-09-28 LAB — RAD ONC ARIA SESSION SUMMARY
Course Elapsed Days: 43
Plan Fractions Treated to Date: 1
Plan Prescribed Dose Per Fraction: 2 Gy
Plan Total Fractions Prescribed: 5
Plan Total Prescribed Dose: 10 Gy
Reference Point Dosage Given to Date: 52.4 Gy
Reference Point Session Dosage Given: 2 Gy
Session Number: 29

## 2021-09-28 NOTE — Progress Notes (Unsigned)
[place]   Telephone:(336) [phone] [phone]   Clinic Follow up Note   Patient Care Team: Jeanie Sewer, NP as PCP - General (Family Medicine) Mauro Kaufmann, RN as Oncology Nurse Navigator Rockwell Germany, RN as Oncology Nurse Navigator Stark Klein, MD as Consulting Physician (General Surgery) [name], MD as Consulting Physician (Hematology) Kyung Rudd, MD as Consulting Physician (Radiation Oncology) Estill Dooms, NP as Nurse Practitioner (Obstetrics and Gynecology) 09/30/2021  CHIEF COMPLAINT: Follow-up left breast cancer  SUMMARY OF ONCOLOGIC HISTORY: Oncology History Overview Note   Cancer Staging  Malignant neoplasm of upper-outer quadrant of left breast in female, estrogen receptor positive ([place]) Staging form: Breast, AJCC 8th Edition - Clinical stage from 11/05/2020: Stage IIA (cT2, cN1, cM0, G1, ER+, PR+, HER2: Equivocal) - Signed by [name], MD on 11/10/2020 - Pathologic stage from 01/06/2021: Stage IB (pT3, pN2a, cM0, G2, ER+, PR+, HER2-) - Signed by [name], MD on 01/12/2021     Malignant neoplasm of upper-outer quadrant of left breast in female, estrogen receptor positive ([place])  10/28/2020 Mammogram   Bilateral Diagnostic Mammogram; Left Breast Ultrasound  IMPRESSION: 1. Suspicious palpable mass/hypoechoic area in the left breast at 12 o'clock retroareolar measuring 4.5 cm.   2.  Suspicious lymph node in the left axilla.   11/05/2020 Cancer Staging   Staging form: Breast, AJCC 8th Edition - Clinical stage from 11/05/2020: Stage IIA (cT2, cN1, cM0, G1, ER+, PR+, HER2: Equivocal) - Signed by [name], MD on 11/10/2020 Stage prefix: Initial diagnosis Histologic grading system: 3 grade system   11/05/2020 Pathology Results   Diagnosis 1. Breast, left, needle core biopsy, 12 o'clock subareolar, ribbon clip - INVASIVE MAMMARY CARCINOMA. SEE NOTE 1. Carcinoma measures 1.5 cm in greatest linear dimension and appears grade  2. 1. Immunohistochemical stain for E-cadherin is positive in the tumor cells, consistent with a ductal phenotype. 1. PROGNOSTIC INDICATORS Results: The tumor cells are EQUIVOCAL for Her2 (2+). Her2 by FISH will be performed and results reported separately. Estrogen Receptor: 70%, POSITIVE, STRONG-MODERATE STAINING INTENSITY Progesterone Receptor: 80%, POSITIVE, MODERATE STAINING INTENSITY Proliferation Marker Ki67: 1%  2. Lymph node, needle/core biopsy, left axilla, tribell clip - METASTATIC CARCINOMA TO A LYMPH NODE. SEE NOTE 2. Largest contiguous focus of metastatic carcinoma measures 0.3 cm. 2. PROGNOSTIC INDICATORS Results: The tumor cells are EQUIVOCAL for Her2 (2+). Her2 by FISH will be performed and results reported separately. Estrogen Receptor: 90%, POSITIVE, STRONG STAINING INTENSITY Progesterone Receptor: 95%, POSITIVE, STRONG STAINING INTENSITY Proliferation Marker Ki67: 1%   11/10/2020 Initial Diagnosis   Malignant neoplasm of upper-outer quadrant of left breast in female, estrogen receptor positive ([place])   11/19/2020 Imaging   EXAM: CT CHEST, ABDOMEN, AND PELVIS WITH CONTRAST  IMPRESSION: 1. Left breast mass  as previously described. 2. Enhancing subcentimeter left axillary lymph nodes. 3. No evidence of distal metastatic disease. 4. Small hiatal hernia   11/19/2020 Imaging   EXAM: NUCLEAR MEDICINE WHOLE BODY BONE SCAN  IMPRESSION: No evidence of metastatic disease.   11/21/2020 Imaging   EXAM: BILATERAL BREAST MRI WITH AND WITHOUT CONTRAST  IMPRESSION: 1. Suspicious 1.3 centimeter mass in the anterior UPPER central RIGHT breast warranting tissue diagnosis. (Image 65 of series 7). 2. 4 millimeter possible satellite nodule posterior to the 1.3 centimeter mass in the RIGHT breast. (Image 63 of series 7). 3. Indeterminate oval mass in the UPPER central middle depth of the RIGHT breast warranting tissue diagnosis. (Image 60 of series 7). 4. Large area of  enhancement in the central portion of the LEFT breast, involving all quadrants. There is associated enhancement and retraction of the LEFT nipple, with significantly smaller size of the LEFT breast. Enhancement spans at least 6.5 centimeters. 5. Enlarged, previously biopsied LEFT axillary lymph node.   11/24/2020 Genetic Testing   Negative hereditary cancer genetic testing: no pathogenic variants detected in [facility] BRCAPlus STAT Panel or [facility] CustomNext-Cancer +RNAinsight Panel.  The report dates are November 24, 2020 and November 27, 2020, respectively.   The BRCAplus panel offered by [employer] and includes sequencing and deletion/duplication analysis for the following 8 genes: ATM, BRCA1, BRCA2, CDH1, CHEK2, PALB2, PTEN, and TP53.  The CustomNext-Cancer+RNAinsight panel offered by [employer] [facility] includes sequencing and rearrangement analysis for the following 47 genes:  APC, ATM, AXIN2, BARD1, BMPR1A, BRCA1, BRCA2, BRIP1, CDH1, CDK4, CDKN2A, CHEK2, DICER1, EPCAM, GREM1, HOXB13, MEN1, MLH1, MSH2, MSH3, MSH6, MUTYH, NBN, NF1, NF2, NTHL1, PALB2, PMS2, POLD1, POLE, PTEN, RAD51C, RAD51D, RECQL, RET, SDHA, SDHAF2, SDHB, SDHC, SDHD, SMAD4, SMARCA4, STK11, TP53, TSC1, TSC2, and VHL.  RNA data is routinely analyzed for use in variant interpretation for all genes.    11/27/2020 Pathology Results   Diagnosis Breast, right, needle core biopsy, anterior upper central - FIBROADENOMATOID AND FIBROCYSTIC CHANGES WITH CALCIFICATIONS - PSEUDOANGIOMATOUS STROMAL HYPERPLASIA - FOCAL PERIDUCTULAR CHRONIC INFLAMMATION - NO MALIGNANCY IDENTIFIED   01/06/2021 Cancer Staging   Staging form: Breast, AJCC 8th Edition - Pathologic stage from 01/06/2021: Stage IB (pT3, pN2a, cM0, G2, ER+, PR+, HER2-) - Signed by [name], MD on 01/12/2021 Stage prefix: Initial diagnosis Histologic grading system: 3 grade system Residual tumor (R): R0 - None   01/06/2021 Definitive Surgery   FINAL MICROSCOPIC DIAGNOSIS:   A.  LYMPH NODE, LEFT AXILLARY, SENTINEL, EXCISION:  - Metastatic carcinoma to a lymph node (1/1)  - Focus of metastatic carcinoma measures 1.5 cm without evidence of extranodal extension   B. 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 BREAST, LEFT INFERIOLATERAL MARGIN, EXCISION:  - Benign fibroadipose tissue  - Negative for carcinoma   D. LYMPH NODE, LEFT AXILLARY #1, SENTINEL, EXCISION:  - Metastatic carcinoma to a lymph node (1/1)  - Focus of metastatic carcinoma measures 1.4 cm without evidence of extranodal extension   E. LYMPH NODE, LEFT AXILLARY #2, SENTINEL, EXCISION:  - Metastatic carcinoma to a lymph node (1/1)  - Focus of metastatic carcinoma measures 0.5 cm without evidence of extranodal extension   F. LYMPH NODE, LEFT AXILLARY #3, SENTINEL, EXCISION:  - Metastatic carcinoma to a lymph node (1/1)  - Focus of metastatic carcinoma measures 0.9 cm without evidence of extranodal extension   G. LYMPH NODE, LEFT AXILLARY #4, SENTINEL, EXCISION:  - Benign fibroadipose tissue, negative for carcinoma  - Lymphoid tissue is not identified    02/11/2021 - 06/24/2021 Chemotherapy   Patient is on Treatment Plan : BREAST ADJUVANT DOSE DENSE AC q14d / PACLitaxel q7d     06/16/2021 PET scan   IMPRESSION: 1. Postsurgical change of left mastectomy and left axillary lymph node dissection with tissue expander in place. Mild hypermetabolic activity about the surgical site without suspicious focal hypermetabolic soft tissue nodularity, favored to reflect postsurgical change.   2. No convincing scintigraphic evidence of hypermetabolic local recurrence or metastatic disease within the neck, chest, abdomen or  pelvis.     CURRENT THERAPY:  1.Adjuvant Zoladex (monthly) and letrozole, changed to exemestane  09/30/21 due to worsening joint pain. Start after Labor Day 2. Currently undergoing adjuvant radiation by Dr. Lisbeth Renshaw plan to complete 8/24 3. PENDING adjuvant verzenio to start in 10/2021  INTERVAL HISTORY: Ms. Elliott returns for follow up as scheduled. Last seen by Dr. Burr Medico 08/19/21. Repeat echo 7/24 was normal. She continues monthly zoladex and daily letrozole.  Joint pain is worsening especially in her feet, hands, and hips.  She tries to avoid Tylenol but takes it if needed and is effective.  She can function but with some difficulty especially when getting out of the car or standing up from a chair.  Mostly now this is affecting her quality of life, the mental struggle is getting her down today.  She is open to considering other AI's.  Also dealing with a recent UTI, will start prophylactic Macrobid after she recovers.  She is completing pelvic floor PT.  She will complete RT 8/28, [place], [place] is helping manage her skin which is doing better.  All other systems were reviewed with the patient and are negative.  MEDICAL HISTORY:  Past Medical History:  Diagnosis Date   Anxiety    Axillary pain, right 01/18/2021   Will get right axillary ultrasound.   Breast cancer ([place])    Breast pain, left 10/06/2020   Encounter for gynecological examination with Papanicolaou smear of cervix 02/10/2021   Encounter for screening fecal occult blood testing 02/10/2021   Family history of breast cancer 11/11/2020   Fibroid 10/27/2020   Has 1 x .9 x .6 cm subserosal fibroid   History of hiatal hernia    Left breast mass 10/06/2020   +breast cancer   LLQ pain 10/06/2020   PONV (postoperative nausea and vomiting)    Port-A-Cath in place 03/25/2021    SURGICAL HISTORY: Past Surgical History:  Procedure Laterality Date   AXILLARY LYMPH NODE DISSECTION Left 07/07/2021   Procedure: LEFT AXILLARY LYMPH NODE DISSECTION;  Surgeon: Stark Klein, MD;  Location: [facility];  Service: General;  Laterality:  Left;   BREAST RECONSTRUCTION WITH PLACEMENT OF TISSUE EXPANDER AND FLEX HD (ACELLULAR HYDRATED DERMIS) Left 01/06/2021   Procedure: LEFT BREAST RECONSTRUCTION WITH PLACEMENT OF TISSUE EXPANDER AND ACELLULAR  DERMIS;  Surgeon: Irene Limbo, MD;  Location: [facility];  Service: Plastics;  Laterality: Left;   MASTECTOMY W/ SENTINEL NODE BIOPSY Left 01/06/2021   Procedure: LEFT MASTECTOMY WITH MAGTRACE;  Surgeon: Stark Klein, MD;  Location: [facility];  Service: General;  Laterality: Left;   PORT-A-CATH REMOVAL Right 07/07/2021   Procedure: REMOVAL PORT-A-CATH;  Surgeon: Stark Klein, MD;  Location: [facility];  Service: General;  Laterality: Right;   PORTACATH PLACEMENT Right 01/19/2021   Procedure: INSERTION PORT-A-CATH;  Surgeon: Stark Klein, MD;  Location: [facility] [place];  Service: General;  Laterality: Right;   RADIOACTIVE SEED GUIDED AXILLARY SENTINEL LYMPH NODE Left 01/06/2021   Procedure: RADIOACTIVE SEED GUIDED SENTINEL LYMPH NODE BIOPSY;  Surgeon: Stark Klein, MD;  Location: [facility];  Service: General;  Laterality: Left;   SENTINEL NODE BIOPSY Left 01/06/2021   Procedure: LEFT AXILLARY SENTINEL LYMPH  NODE BIOPSY;  Surgeon: Stark Klein, MD;  Location: [facility];  Service: General;  Laterality: Left;   WISDOM TOOTH EXTRACTION      I have reviewed the social history and family history with the patient and they are unchanged from previous note.  ALLERGIES:  is allergic to  prochlorperazine.  MEDICATIONS:  Current Outpatient Medications  Medication Sig Dispense Refill   exemestane (AROMASIN) 25 MG tablet Take 1 tablet by mouth daily after breakfast. 30 tablet 2   acetaminophen (TYLENOL) 500 MG tablet Take 500-1,000 mg by mouth every 6 (six) hours as needed for moderate pain or headache.     Cholecalciferol (DIALYVITE VITAMIN D 5000) 125 MCG (5000 UT) capsule Take 5,000 Units by mouth daily. Vit D and K supplement combined     famotidine (PEPCID) 10 MG tablet Take 10 mg by mouth daily  as needed for heartburn or indigestion.     Goserelin Acetate (ZOLADEX [place]) Inject into the skin every 30 (thirty) days.     hydrOXYzine (ATARAX) 10 MG tablet Take 1 tablet (10 mg total) by mouth 3 (three) times daily as needed. Start at night time due to risks of drowsiness. 30 tablet 0   loratadine (CLARITIN) 10 MG tablet Take 10 mg by mouth daily.     nitrofurantoin (MACRODANTIN) 50 MG capsule Take 1 capsule (50 mg total) by mouth as directed. Take 1 hour prior or immediately after sexual intercourse. 20 capsule 2   tretinoin (RETIN-A) 0.025 % cream Apply 1 application. topically at bedtime.     No current facility-administered medications for this visit.    PHYSICAL EXAMINATION: ECOG PERFORMANCE STATUS: 1 - Symptomatic but completely ambulatory  Vitals:   09/30/21 0855  BP: 127/72  Pulse: 73  Resp: 17  Temp: 98.3 F (36.8 C)  SpO2: 100%   Filed Weights   09/30/21 0855  Weight: 125 lb 9.6 oz (57 kg)    GENERAL:alert, no distress and comfortable SKIN: not examined, see rad onc note EYES:  sclera clear LUNGS: normal breathing effort HEART: no lower extremity edema NEURO: tearful at times. alert & oriented x 3 with fluent speech, no focal motor/sensory deficits No port.  Breast exam deferred  LABORATORY DATA:  I have reviewed the data as listed    Latest Ref Rng & Units 09/30/2021    8:28 AM 08/19/2021   10:53 AM 07/21/2021    9:20 AM  CBC  WBC 4.0 - 10.5 K/uL 2.3  3.4  4.2   Hemoglobin 12.0 - 15.0 g/dL 13.7  13.4  13.9   Hematocrit 36.0 - 46.0 % 39.2  39.0  40.5   Platelets 150 - 400 K/uL 204  211  217         Latest Ref Rng & Units 09/30/2021    8:28 AM 08/19/2021   10:53 AM 07/21/2021    9:20 AM  CMP  Glucose 70 - 99 mg/dL 96  89  101   BUN 6 - 20 mg/dL _0 Creatinine 0.44 - 1.00 mg/dL 0.49  0.66  0.71   Sodium 135 - 145 mmol/L 141  139  140   Potassium 3.5 - 5.1 mmol/L 4.3  4.5  3.9   Chloride 98 - 111 mmol/L 104  104  104   CO2 22 - 32 mmol/L _1 Calcium 8.9 - 10.3 mg/dL 10.4  9.9  10.4   Total Protein 6.5 - 8.1 g/dL 7.5  7.1  7.6   Total Bilirubin 0.3 - 1.2 mg/dL 0.6  0.5  0.4   Alkaline Phos 38 - 126 U/L 41  40  34   AST 15 - 41 U/L _2 ALT 0 - 44 U/L 14  19  19  RADIOGRAPHIC STUDIES: I have personally reviewed the radiological images as listed and agreed with the findings in the report. No results found.   ASSESSMENT & PLAN:  Pamela Reyes is a 41 y.o. female with    1. 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 The mass has grown significantly since 2017. Biopsy on 11/05/20 confirmed invasive ductal carcinoma, with metastatic involvement of one lymph node. -staging CT CAP and bone scan on 11/19/20 showed no distant metastasis. -she underwent left mastectomy on 01/06/21 under Dr. Barry Dienes. Pathology showed 6.5 cm invasive ductal carcinoma, grade 2, involving dermis of nipple without epidermal involvement. Margins were negative, but all 4 lymph nodes showed metastatic disease. -She completed adjuvant AC-Abraxane from 02/11/21 - 06/24/21 -s/p ALND 07/07/21 with Dr. Barry Dienes, path showed 1/11 + LNs -She started adjuvant zoladex and letrozole in 07/2021, tolerated well initially with hot flashes and mild joint pain.  -She is currently undergoing adjuvant post-mastectomy RT by Dr. Lisbeth Renshaw 08/16/21 - anticipate completing on 09/03/21 -Pamela Reyes appears physically stable, but going through a lot and weighing on her mentally/QOL, especially worsening joint pain from letrozole. She will stop now and change to exemestane in 1-2 weeks. Continue monthly zoladex (last dose 8/10). -Due to her multiple positive lymph nodes, the plan is to begin adjuvant verzenio when she recovers from RT. We reviewed potential risk/benefit and SE's, especially fatigue,  hematologic, GI (diarrhea), and pulmonary toxicity. Plan to start at next visit in 4 weeks, at 100 mg BID for first 2 weeks. Will increase to full dose if she tolerates well.  -Labs reviewed, mild neutropenia likely from RT and recent infection (UTI), she is recovering.  -She is due for screening right mammogram, order placed today -I answered other questions including GU and pelvic/sexual health today to her satisfaction -She agrees with the plan and knows to call sooner with new changes or concerns   2. Genetics -given her young age and strong family history of breast cancer, we recommend genetic testing. -She reports her sister, who was diagnosed at age 65 and has since recovered, had negative genetic testing. -results were negative.   3.  Mental health -She has had periodic anxiety through treatment but mostly good days -Recently she feels down from feeling bad, mainly joint discomfort from AI -Validated, emotional support given -Appreciate social work Trilby Leaver as needed   4. Health maintenance -She had pap/pelvic 02/10/21 -she takes vitamin C, D, B complex, and pre/probiotic for GI health. Ok to continue  -Due to pre-existing neuropathy, she used cryotherapy with chemo, she developed CIPN anyway. -Followed by neurology Dr. Krista Blue  5.  Vaginal dryness, recurrent UTI -Currently doing pelvic floor PT -Okay to use water-based lubricant -She is recovering from a UTI, will start prophylactic Macrobid after intercourse when she recovers (per PCP)   Plan: -Labs reviewed, mild neutropenia secondary to radiation and recent infection (UTI) -Plan to complete RT 8/28, then out of town for Labor Day weekend -Stop letrozole due to worsening joint pain, start exemestane in 1-2 weeks -Next Zoladex in 2 weeks, continue monthly -lab, f/up and start verzenio in 4 weeks, 100 mg BID at first to see how she tolerates  -Due R mammo in September, ordered today -Plan reviewed with Dr. Burr Medico    Orders  Placed This Encounter  Procedures   MM 3D SCREEN BREAST UNI RIGHT  Standing Status:   Future    Standing Expiration Date:   10/01/2022    Order Specific Question:   Reason for Exam (SYMPTOM  OR DIAGNOSIS REQUIRED)    Answer:   h/o left breast cancer    Order Specific Question:   Is the patient pregnant?    Answer:   No    Order Specific Question:   Preferred imaging location?    Answer:   [facility]   All questions were answered. The patient knows to call the clinic with any problems, questions or concerns. No barriers to learning was detected. I spent 20 minutes counseling the patient face to face. The total time spent in the appointment was 30 minutes and more than 50% was on counseling and review of test results     Alla Feeling, NP 09/30/21

## 2021-09-29 ENCOUNTER — Other Ambulatory Visit: Payer: Self-pay

## 2021-09-29 ENCOUNTER — Ambulatory Visit: Payer: BC Managed Care – PPO

## 2021-09-29 ENCOUNTER — Ambulatory Visit: Payer: BC Managed Care – PPO | Admitting: Physical Therapy

## 2021-09-29 ENCOUNTER — Ambulatory Visit
Admission: RE | Admit: 2021-09-29 | Discharge: 2021-09-29 | Disposition: A | Discharge status: Home / Self Care | Payer: BC Managed Care – PPO | Source: Ambulatory Visit | Attending: Radiation Oncology | Admitting: Radiation Oncology

## 2021-09-29 DIAGNOSIS — Z170 Estrogen receptor positive status [ER+]: Secondary | ICD-10-CM

## 2021-09-29 DIAGNOSIS — C50412 Malignant neoplasm of upper-outer quadrant of left female breast: Secondary | ICD-10-CM | POA: Diagnosis not present

## 2021-09-29 LAB — RAD ONC ARIA SESSION SUMMARY
Course Elapsed Days: 44
Plan Fractions Treated to Date: 2
Plan Prescribed Dose Per Fraction: 2 Gy
Plan Total Fractions Prescribed: 5
Plan Total Prescribed Dose: 10 Gy
Reference Point Dosage Given to Date: 54.4 Gy
Reference Point Session Dosage Given: 2 Gy
Session Number: 30

## 2021-09-30 ENCOUNTER — Encounter: Payer: Self-pay | Admitting: Hematology

## 2021-09-30 ENCOUNTER — Inpatient Hospital Stay (HOSPITAL_BASED_OUTPATIENT_CLINIC_OR_DEPARTMENT_OTHER): Payer: BC Managed Care – PPO | Admitting: Nurse Practitioner

## 2021-09-30 ENCOUNTER — Other Ambulatory Visit: Payer: Self-pay

## 2021-09-30 ENCOUNTER — Encounter: Payer: Self-pay | Admitting: *Deleted

## 2021-09-30 ENCOUNTER — Ambulatory Visit
Admission: RE | Admit: 2021-09-30 | Discharge: 2021-09-30 | Disposition: A | Discharge status: Home / Self Care | Payer: BC Managed Care – PPO | Source: Ambulatory Visit | Attending: Radiation Oncology | Admitting: Radiation Oncology

## 2021-09-30 ENCOUNTER — Inpatient Hospital Stay: Payer: BC Managed Care – PPO

## 2021-09-30 ENCOUNTER — Other Ambulatory Visit (HOSPITAL_COMMUNITY): Payer: Self-pay

## 2021-09-30 ENCOUNTER — Encounter: Payer: Self-pay | Admitting: Nurse Practitioner

## 2021-09-30 VITALS — BP 127/72 | HR 73 | Temp 98.3°F | Resp 17 | Ht 64.0 in | Wt 125.6 lb

## 2021-09-30 DIAGNOSIS — Z170 Estrogen receptor positive status [ER+]: Secondary | ICD-10-CM

## 2021-09-30 DIAGNOSIS — C50412 Malignant neoplasm of upper-outer quadrant of left female breast: Secondary | ICD-10-CM

## 2021-09-30 LAB — COMPREHENSIVE METABOLIC PANEL
ALT: 14 U/L (ref 0–44)
AST: 17 U/L (ref 15–41)
Albumin: 4.8 g/dL (ref 3.5–5.0)
Alkaline Phosphatase: 41 U/L (ref 38–126)
Anion gap: 8 (ref 5–15)
BUN: 11 mg/dL (ref 6–20)
CO2: 29 mmol/L (ref 22–32)
Calcium: 10.4 mg/dL — ABNORMAL HIGH (ref 8.9–10.3)
Chloride: 104 mmol/L (ref 98–111)
Creatinine, Ser: 0.49 mg/dL (ref 0.44–1.00)
GFR, Estimated: >60 mL/min (ref >60)
Glucose, Bld: 96 mg/dL (ref 70–99)
Potassium: 4.3 mmol/L (ref 3.5–5.1)
Sodium: 141 mmol/L (ref 135–145)
Total Bilirubin: 0.6 mg/dL (ref 0.3–1.2)
Total Protein: 7.5 g/dL (ref 6.5–8.1)

## 2021-09-30 LAB — CBC WITH DIFFERENTIAL/PLATELET
Abs Immature Granulocytes: 0.01 10*3/uL (ref 0.00–0.07)
Basophils Absolute: 0 10*3/uL (ref 0.0–0.1)
Basophils Relative: 1 %
Eosinophils Absolute: 0 10*3/uL (ref 0.0–0.5)
Eosinophils Relative: 1 %
HCT: 39.2 % (ref 36.0–46.0)
Hemoglobin: 13.7 g/dL (ref 12.0–15.0)
Immature Granulocytes: 0 %
Lymphocytes Relative: 19 %
Lymphs Abs: 0.5 10*3/uL — ABNORMAL LOW (ref 0.7–4.0)
MCH: 30.6 pg (ref 26.0–34.0)
MCHC: 34.9 g/dL (ref 30.0–36.0)
MCV: 87.7 fL (ref 80.0–100.0)
Monocytes Absolute: 0.2 10*3/uL (ref 0.1–1.0)
Monocytes Relative: 9 %
Neutro Abs: 1.6 10*3/uL — ABNORMAL LOW (ref 1.7–7.7)
Neutrophils Relative %: 70 %
Platelets: 204 10*3/uL (ref 150–400)
RBC: 4.47 MIL/uL (ref 3.87–5.11)
RDW: 13 % (ref 11.5–15.5)
WBC: 2.3 10*3/uL — ABNORMAL LOW (ref 4.0–10.5)
nRBC: 0 % (ref 0.0–0.2)

## 2021-09-30 LAB — RAD ONC ARIA SESSION SUMMARY
Course Elapsed Days: 45
Plan Fractions Treated to Date: 3
Plan Prescribed Dose Per Fraction: 2 Gy
Plan Total Fractions Prescribed: 5
Plan Total Prescribed Dose: 10 Gy
Reference Point Dosage Given to Date: 56.4 Gy
Reference Point Session Dosage Given: 2 Gy
Session Number: 31

## 2021-09-30 MED ORDER — EXEMESTANE 25 MG PO TABS
25.0000 mg | ORAL_TABLET | Freq: Every day | ORAL | 2 refills | Status: DC
  Filled 2021-09-30: qty 30, 30d supply, fill #0
  Filled 2021-10-27: qty 30, 30d supply, fill #1
  Filled 2021-11-26: qty 30, 30d supply, fill #2

## 2021-10-01 ENCOUNTER — Ambulatory Visit
Admission: RE | Admit: 2021-10-01 | Discharge: 2021-10-01 | Disposition: A | Discharge status: Home / Self Care | Payer: BC Managed Care – PPO | Source: Ambulatory Visit | Attending: Radiation Oncology | Admitting: Radiation Oncology

## 2021-10-01 ENCOUNTER — Ambulatory Visit: Payer: BC Managed Care – PPO

## 2021-10-01 ENCOUNTER — Other Ambulatory Visit: Payer: Self-pay

## 2021-10-01 ENCOUNTER — Ambulatory Visit
Admission: RE | Admit: 2021-10-01 | Discharge: 2021-10-01 | Disposition: A | Discharge status: Home / Self Care | Payer: BC Managed Care – PPO | Source: Ambulatory Visit

## 2021-10-01 DIAGNOSIS — C50412 Malignant neoplasm of upper-outer quadrant of left female breast: Secondary | ICD-10-CM | POA: Diagnosis not present

## 2021-10-01 LAB — RAD ONC ARIA SESSION SUMMARY
Course Elapsed Days: 46
Plan Fractions Treated to Date: 4
Plan Prescribed Dose Per Fraction: 2 Gy
Plan Total Fractions Prescribed: 5
Plan Total Prescribed Dose: 10 Gy
Reference Point Dosage Given to Date: 58.4 Gy
Reference Point Session Dosage Given: 2 Gy
Session Number: 32

## 2021-10-04 ENCOUNTER — Ambulatory Visit
Admission: RE | Admit: 2021-10-04 | Discharge: 2021-10-04 | Disposition: A | Discharge status: Home / Self Care | Payer: BC Managed Care – PPO | Source: Ambulatory Visit | Attending: Radiation Oncology | Admitting: Radiation Oncology

## 2021-10-04 ENCOUNTER — Other Ambulatory Visit: Payer: Self-pay

## 2021-10-04 ENCOUNTER — Encounter: Payer: Self-pay | Admitting: Radiation Oncology

## 2021-10-04 ENCOUNTER — Ambulatory Visit: Payer: BC Managed Care – PPO

## 2021-10-04 DIAGNOSIS — C50412 Malignant neoplasm of upper-outer quadrant of left female breast: Secondary | ICD-10-CM | POA: Diagnosis not present

## 2021-10-04 LAB — RAD ONC ARIA SESSION SUMMARY
Course Elapsed Days: 49
Plan Fractions Treated to Date: 5
Plan Prescribed Dose Per Fraction: 2 Gy
Plan Total Fractions Prescribed: 5
Plan Total Prescribed Dose: 10 Gy
Reference Point Dosage Given to Date: 60.4 Gy
Reference Point Session Dosage Given: 2 Gy
Session Number: 33

## 2021-10-04 NOTE — Progress Notes (Addendum)
   Radiation Oncology         [phone] ________________________________  Name: [name] MRN: [id number]  Date: 10/04/2021  DOB: 1980/08/26  End of Treatment Note  Diagnosis:  Stage IB, pT3N2M0 grade 2 ER/PR positive invasive ductal carcinoma of the left breast.  Indication for treatment:  Curative       Radiation treatment dates:   08/16/21-10/04/21  Site/dose:   The patient initially received a dose of 50.4 Gy in 28 fractions to the chest wall and supraclavicular region. This was delivered using a 3-D conformal, 4 field technique. The patient then received a boost to the mastectomy scar. This delivered an additional 10 Gy in 5 fractions using an en face electron field. The total dose was 60.4 Gy.  Narrative: The patient tolerated radiation treatment relatively well.   The patient had some expected skin irritation as she progressed during treatment. She developed dry desquamation at the conclusion of her therapy.  Plan: The patient will receive a call in about one month from the radiation oncology department. She will continue follow up with Dr. Burr Medico as well.      Carola Rhine, PAC

## 2021-10-07 ENCOUNTER — Telehealth: Payer: Self-pay | Admitting: Pharmacy Technician

## 2021-10-07 ENCOUNTER — Other Ambulatory Visit (HOSPITAL_COMMUNITY): Payer: Self-pay

## 2021-10-07 ENCOUNTER — Telehealth: Payer: Self-pay | Admitting: Pharmacist

## 2021-10-07 ENCOUNTER — Other Ambulatory Visit: Payer: Self-pay | Admitting: Nurse Practitioner

## 2021-10-07 DIAGNOSIS — Z170 Estrogen receptor positive status [ER+]: Secondary | ICD-10-CM

## 2021-10-07 MED ORDER — ABEMACICLIB 100 MG PO TABS: 100.0000 mg | ORAL_TABLET | Freq: Two times a day (BID) | ORAL | 0 refills | Status: DC

## 2021-10-07 MED ORDER — BUDESONIDE ER 9 MG PO CP24
9.0000 mg | ORAL_CAPSULE | Freq: Every day | ORAL | 0 refills | Status: DC
  Filled 2021-10-07: qty 30, fill #0

## 2021-10-07 MED ORDER — BUDESONIDE ER 9 MG PO TB24
1.0000 | ORAL_TABLET | Freq: Every day | ORAL | 0 refills | Status: DC
  Filled 2021-10-07: qty 30, 30d supply, fill #0

## 2021-10-07 MED ORDER — ABEMACICLIB 100 MG PO TABS
100.0000 mg | ORAL_TABLET | Freq: Two times a day (BID) | ORAL | 0 refills | Status: DC
  Filled 2021-10-07: qty 56, 28d supply, fill #0

## 2021-10-07 NOTE — Telephone Encounter (Signed)
Oral Oncology Patient Advocate Encounter   Received notification that prior authorization for Verzenio is required.   PA submitted on 10/07/2021 Key [id number] Status is pending     Pamela Reyes, CPhT-Adv Oncology Pharmacy Patient [place] Direct Number: [phone]  Fax: [phone]

## 2021-10-07 NOTE — Telephone Encounter (Signed)
Oral Oncology Pharmacist Encounter  Patient's insurance requires Verzenio to be filled through [facility]. Prescription redirected for dispensing.   Leron Croak, PharmD, BCPS, [facility] Hematology/Oncology Clinical Pharmacist [facility] and [place] [phone] 10/07/2021 3:44 PM

## 2021-10-07 NOTE — Telephone Encounter (Signed)
Oral Oncology Pharmacist Encounter  Received new prescription for Verzenio (abemaciclib) for the treatment of early stage, high-risk ER/PR positive, HER-2 negative breast cancer in conjunction with exemestane and Zoladex, planned duration ~2 years.  CMP and CBC w/ Diff from 09/30/21 assessed, noted pt with WBC 2.3 K/uL and ANC 1600. Plan is for patient to not start until after next OV on 10/28/21. Prescription dose and frequency assessed for appropriateness. Patient will start on reduced dose of Verzenio for first month and if tolerated may increase to full dose.   Current medication list in Epic reviewed, no relevant/significant DDIs with Verzenio identified.  Evaluated chart and no patient barriers to medication adherence noted.   Patient agreement for treatment documented in MD note on 09/30/21.  Prescription has been e-scribed to the [facility] for benefits analysis and approval.  Oral Oncology Clinic will continue to follow for insurance authorization, copayment issues, initial counseling and start date.  Leron Croak, PharmD, BCPS, [facility] Hematology/Oncology Clinical Pharmacist [facility] and [place] [phone] 10/07/2021 1:37 PM

## 2021-10-07 NOTE — Telephone Encounter (Signed)
Oral Oncology Patient Advocate Encounter  Prior Authorization for Pamela Reyes has been approved.    PA#  [id number] Effective dates: 10/07/2021 through 10/08/2022  Patient must fill at [facility].    Lady Deutscher, CPhT-Adv Oncology Pharmacy Patient [place] Direct Number: [phone]  Fax: [phone]

## 2021-10-08 ENCOUNTER — Other Ambulatory Visit (HOSPITAL_COMMUNITY): Payer: Self-pay

## 2021-10-13 ENCOUNTER — Encounter: Payer: Self-pay | Admitting: Rehabilitation

## 2021-10-13 ENCOUNTER — Ambulatory Visit: Payer: BC Managed Care – PPO | Admitting: Physical Therapy

## 2021-10-13 ENCOUNTER — Ambulatory Visit: Payer: BC Managed Care – PPO | Attending: General Surgery | Admitting: Rehabilitation

## 2021-10-13 DIAGNOSIS — R279 Unspecified lack of coordination: Secondary | ICD-10-CM | POA: Diagnosis present

## 2021-10-13 DIAGNOSIS — R293 Abnormal posture: Secondary | ICD-10-CM

## 2021-10-13 DIAGNOSIS — Z483 Aftercare following surgery for neoplasm: Secondary | ICD-10-CM | POA: Insufficient documentation

## 2021-10-13 DIAGNOSIS — M6281 Muscle weakness (generalized): Secondary | ICD-10-CM | POA: Insufficient documentation

## 2021-10-13 NOTE — Therapy (Signed)
OUTPATIENT PHYSICAL THERAPY SOZO SCREENING NOTE   Patient Name: Pamela Reyes MRN: [id number] DOB:08-15-1980, 41 y.o., female Today's Date: 10/13/2021  PCP: Jeanie Sewer, NP REFERRING PROVIDER: Stark Klein, MD   PT End of Session - 10/13/21 0758     Visit Number 7   screen only   PT Start Time 0803    PT Stop Time 0806    PT Time Calculation (min) 3 min    Activity Tolerance Patient tolerated treatment well    Behavior During Therapy [facility] for tasks assessed/performed             Past Medical History:  Diagnosis Date   Anxiety    Axillary pain, right 01/18/2021   Will get right axillary ultrasound.   Breast cancer ([place])    Breast pain, left 10/06/2020   Encounter for gynecological examination with Papanicolaou smear of cervix 02/10/2021   Encounter for screening fecal occult blood testing 02/10/2021   Family history of breast cancer 11/11/2020   Fibroid 10/27/2020   Has 1 x .9 x .6 cm subserosal fibroid   History of hiatal hernia    Left breast mass 10/06/2020   +breast cancer   LLQ pain 10/06/2020   PONV (postoperative nausea and vomiting)    Port-A-Cath in place 03/25/2021   Past Surgical History:  Procedure Laterality Date   AXILLARY LYMPH NODE DISSECTION Left 07/07/2021   Procedure: LEFT AXILLARY LYMPH NODE DISSECTION;  Surgeon: Stark Klein, MD;  Location: [place];  Service: General;  Laterality: Left;   BREAST RECONSTRUCTION WITH PLACEMENT OF TISSUE EXPANDER AND FLEX HD (ACELLULAR HYDRATED DERMIS) Left 01/06/2021   Procedure: LEFT BREAST RECONSTRUCTION WITH PLACEMENT OF TISSUE EXPANDER AND ACELLULAR  DERMIS;  Surgeon: Irene Limbo, MD;  Location: [place];  Service: Plastics;  Laterality: Left;   MASTECTOMY W/ SENTINEL NODE BIOPSY Left 01/06/2021   Procedure: LEFT MASTECTOMY WITH MAGTRACE;  Surgeon: Stark Klein, MD;  Location: [place];  Service: General;  Laterality: Left;   PORT-A-CATH REMOVAL Right 07/07/2021   Procedure: REMOVAL  PORT-A-CATH;  Surgeon: Stark Klein, MD;  Location: [place];  Service: General;  Laterality: Right;   PORTACATH PLACEMENT Right 01/19/2021   Procedure: INSERTION PORT-A-CATH;  Surgeon: Stark Klein, MD;  Location: [place];  Service: General;  Laterality: Right;   RADIOACTIVE SEED GUIDED AXILLARY SENTINEL LYMPH NODE Left 01/06/2021   Procedure: RADIOACTIVE SEED GUIDED SENTINEL LYMPH NODE BIOPSY;  Surgeon: Stark Klein, MD;  Location: [place];  Service: General;  Laterality: Left;   SENTINEL NODE BIOPSY Left 01/06/2021   Procedure: LEFT AXILLARY SENTINEL LYMPH  NODE BIOPSY;  Surgeon: Stark Klein, MD;  Location: [place];  Service: General;  Laterality: Left;   WISDOM TOOTH EXTRACTION     Patient Active Problem List   Diagnosis Date Noted   Left flank pain, chronic 09/27/2021   Paresthesia 06/08/2021   Port-A-Cath in place 03/25/2021   Genetic testing 11/25/2020   Family history of breast cancer 11/11/2020   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 lymph node, left ([place]) 11/11/2020   Dense breasts 11/11/2020   Malignant neoplasm of upper-outer quadrant of left breast in female, estrogen receptor positive ([place]) 11/10/2020   Fibroid 10/27/2020    REFERRING DIAG: left breast cancer at risk for lymphedema  THERAPY DIAG:  Aftercare following surgery for neoplasm  PERTINENT HISTORY: Diagnosed 10/29/2020 and found by self exam. Biopsy revealed IDC ER, PR +, HER 2 -.  Pt underwent left mastectomy on 01/06/2021 with expander. 4+/5  LN. Chemotherapy completed ACP, Lymph node dissection on 07/07/21 with 1/11 lymph nodes positive. Radiation simulation on 08/02/21.   PRECAUTIONS: left UE Lymphedema risk  SUBJECTIVE: just finished radiation  PAIN:  Are you having pain? No  SOZO SCREENING: Patient was assessed today using the SOZO machine to determine the lymphedema index score. This was compared to her baseline score. It was determined that she is within the recommended range when  compared to her baseline and no further action is needed at this time. She will continue SOZO screenings. These are done every 3 months for 2 years post operatively followed by every 6 months for 2 years, and then annually.   Stark Bray, PT 10/13/2021, 8:07 AM

## 2021-10-13 NOTE — Patient Instructions (Signed)
[email or web address]/[email or web address]   

## 2021-10-13 NOTE — Therapy (Signed)
OUTPATIENT PHYSICAL THERAPY FEMALE PELVIC TREATMENT   Patient Name: Pamela Reyes MRN: [id number] DOB:1981-01-31, 41 y.o., female Today's Date: 10/13/2021   PT End of Session - 10/13/21 0843     Visit Number 8    Number of Visits 8    Date for PT Re-Evaluation 11/26/21    Authorization Type BCBS    PT Start Time 0845    PT Stop Time 0923    PT Time Calculation (min) 38 min    Activity Tolerance Patient tolerated treatment well    Behavior During Therapy [facility] for tasks assessed/performed             Past Medical History:  Diagnosis Date   Anxiety    Axillary pain, right 01/18/2021   Will get right axillary ultrasound.   Breast cancer ([place])    Breast pain, left 10/06/2020   Encounter for gynecological examination with Papanicolaou smear of cervix 02/10/2021   Encounter for screening fecal occult blood testing 02/10/2021   Family history of breast cancer 11/11/2020   Fibroid 10/27/2020   Has 1 x .9 x .6 cm subserosal fibroid   History of hiatal hernia    Left breast mass 10/06/2020   +breast cancer   LLQ pain 10/06/2020   PONV (postoperative nausea and vomiting)    Port-A-Cath in place 03/25/2021   Past Surgical History:  Procedure Laterality Date   AXILLARY LYMPH NODE DISSECTION Left 07/07/2021   Procedure: LEFT AXILLARY LYMPH NODE DISSECTION;  Surgeon: Stark Klein, MD;  Location: [place];  Service: General;  Laterality: Left;   BREAST RECONSTRUCTION WITH PLACEMENT OF TISSUE EXPANDER AND FLEX HD (ACELLULAR HYDRATED DERMIS) Left 01/06/2021   Procedure: LEFT BREAST RECONSTRUCTION WITH PLACEMENT OF TISSUE EXPANDER AND ACELLULAR  DERMIS;  Surgeon: Irene Limbo, MD;  Location: [place];  Service: Plastics;  Laterality: Left;   MASTECTOMY W/ SENTINEL NODE BIOPSY Left 01/06/2021   Procedure: LEFT MASTECTOMY WITH MAGTRACE;  Surgeon: Stark Klein, MD;  Location: [place];  Service: General;  Laterality: Left;   PORT-A-CATH REMOVAL Right 07/07/2021   Procedure:  REMOVAL PORT-A-CATH;  Surgeon: Stark Klein, MD;  Location: [place];  Service: General;  Laterality: Right;   PORTACATH PLACEMENT Right 01/19/2021   Procedure: INSERTION PORT-A-CATH;  Surgeon: Stark Klein, MD;  Location: [place];  Service: General;  Laterality: Right;   RADIOACTIVE SEED GUIDED AXILLARY SENTINEL LYMPH NODE Left 01/06/2021   Procedure: RADIOACTIVE SEED GUIDED SENTINEL LYMPH NODE BIOPSY;  Surgeon: Stark Klein, MD;  Location: [place];  Service: General;  Laterality: Left;   SENTINEL NODE BIOPSY Left 01/06/2021   Procedure: LEFT AXILLARY SENTINEL LYMPH  NODE BIOPSY;  Surgeon: Stark Klein, MD;  Location: [place];  Service: General;  Laterality: Left;   WISDOM TOOTH EXTRACTION     Patient Active Problem List   Diagnosis Date Noted   Left flank pain, chronic 09/27/2021   Paresthesia 06/08/2021   Port-A-Cath in place 03/25/2021   Genetic testing 11/25/2020   Family history of breast cancer 11/11/2020   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 lymph node, left ([place]) 11/11/2020   Dense breasts 11/11/2020   Malignant neoplasm of upper-outer quadrant of left breast in female, estrogen receptor positive ([place]) 11/10/2020   Fibroid 10/27/2020    PCP: Maximiano Coss, NP  REFERRING PROVIDER: Stark Klein, MD  REFERRING DIAG: [phone] (ICD-10-CM) - Malignant neoplasm of upper-outer quadrant of left female breast C50.912 (ICD-10-CM) - Malignant neoplasm of unspecified site of left female breast N89.8 (ICD-10-CM) -  Other specified noninflammatory disorders of vagina  THERAPY DIAG:  Muscle weakness (generalized)  Unspecified lack of coordination  Abnormal posture  Rationale for Evaluation and Treatment Rehabilitation  ONSET DATE: 2-3 months ago  SUBJECTIVE:                                                                                                                                                                                           SUBJECTIVE  STATEMENT: Pt reports she has been moisturizing internally and externally and this is helpful; has been sick UTI cleared up now.   Fluid intake: Yes: 64 oz     PAIN:  Are you having pain? No   PRECAUTIONS: actively having CA radiation treatment  WEIGHT BEARING RESTRICTIONS No  FALLS:  Has patient fallen in last 6 months? No  LIVING ENVIRONMENT: Lives with: lives with their spouse Lives in: House/apartment   OCCUPATION: used book Training and development officer  PLOF: Independent  PATIENT GOALS to have less pain at vaginal opening  PERTINENT HISTORY:  underwent left mastectomy on 01/06/21, 4 cycles AC 02/11/21 - 03/25/21,  additional 12 cycles 06/24/21, left ALND on 07/07/21 with Dr. Barry Dienes and had one out of 11 nodes positive,  Sexual abuse: No  BOWEL MOVEMENT Pain with bowel movement: No Type of bowel movement:Type ([place] Stool Scale) 1, Frequency every 2-3 days, and Strain No Fully empty rectum: Yes:   Leakage: No Pads: No Fiber supplement: No  URINATION Pain with urination: No Fully empty bladder: No Stream: Strong Urgency: No Frequency: not quicker than every 2 hours Leakage:  no Pads: No  INTERCOURSE Pain with intercourse: Initial Penetration, During Penetration, and During Climax Ability to have vaginal penetration:  Yes: but painful Climax: painful  Marinoff Scale: 2/3  PREGNANCY Vaginal deliveries 0 Tearing No C-section deliveries 0 Currently pregnant No  PROLAPSE None    OBJECTIVE:   DIAGNOSTIC FINDINGS:    COGNITION:  Overall cognitive status: Within functional limits for tasks assessed     SENSATION:  Light touch: Appears intact  Proprioception: Appears intact  MUSCLE LENGTH: Bil hamstrings and adductors limited by 25%                POSTURE: rounded shoulders  LUMBARAROM/PROM  A/PROM A/PROM  eval  Flexion Limited by 75%  Extension WFL  Right lateral flexion WFL  Left lateral flexion WFL  Right rotation Limited by 50%  Left rotation  Limited by 50%   (Blank rows = not tested)  LOWER EXTREMITY ROM:  WFL  LOWER EXTREMITY MMT:  Hips grossly 4+/5; knees and ankles 5/5   PALPATION:   General  no TTP but mild  fascial restrictions at abdomen in all quadrants                External Perineal Exam no TTP but did have mild dryness                              Internal Pelvic Floor mild TTP throughout pelvic floor at superficial layers and deep layers, mildly worse at Rt side compared to lt. Pt denied pain but reports she could feel the pressure/tension.   Patient confirms identification and approves PT to assess internal pelvic floor and treatment Yes  PELVIC MMT:   MMT eval  Vaginal 5/5; 9s isometrics; 8 reps  Internal Anal Sphincter   External Anal Sphincter   Puborectalis   Diastasis Recti   (Blank rows = not tested)        TONE: WFL  PROLAPSE: Not seen in hooklying   TODAY'S TREATMENT   10/13/2021:   Manual work: Internal vaginal assessment with pt reporting she has not been able to do a lot of dilator use since last visit (maybe 3x per pt) and did have intercourse but it was painful with penetration despite lubricant use during and consistent moisturizer use outside of intercourse. Pt found to have greatly increased tension and lacking mobility at Lt bulbocavernosus compared to Rt side today, was slightly painful but no more than 3/10 per pt. With gentle overpressure and stretching this released well and improved mobility noted with pt reporting 1/10 pain. Pt reports she can tell most pain is at lt side during intercourse as well. Deeper muscle layer progressed to next with tension throughout felt mostly at lt obturator internus but no trigger points today. Pt tolerated gentle stretching well.  Self -care: pt educated on vaginal dilator consistent use of 2x per week to improve mobility of muscles at vaginal opening to decrease pain, continued HEP, and to attempt a relaxation practice with pelvic relaxation  video given and pt agreed. Pt reported seeing how much stretching helped during session today pt motivated to be more consistent with these things outside of PT pt just limited in the last couple of weeks with schedule.   09/15/2021:  Manual work: Internal vaginal assessment as she reports she had improvement with pain with intercourse but still there and wants to see if this can be helped with releasing muscle tension. Pt did have tightness at Lt obturator internus and iliococcygeus. Released well with gentle overpressure and pt reports she felt better with not having any TTP at these muscles. This was overall better than eval. Pt educated on muscle relaxation techniques, breathing mechanics, and lubricant use for intercourse. Pt reports moisturizers have been helpful overall and has been having a lot less pain/dryness and less pain with intercourse however does still have some. Pt also educated on vaginal dilators use at home and agreed to attempt.    EVAL Examination completed, findings reviewed, pt educated on POC, HEP, and feminine moisturizers and lubricants and pt has already purchases vaginal dilators and requested information on how to use this was given and discussed. However pt educated on attempting moisturizers and HEP first to see if these help. Pt motivated to participate in PT and agreeable to attempt recommendations.     PATIENT EDUCATION:  Education details: [id number] Person educated: Patient Education method: Explanation, Demonstration, Corporate treasurer cues, Verbal cues, and Handouts Education comprehension: verbalized understanding and returned demonstration   HOME EXERCISE PROGRAM: [id number]  ASSESSMENT:  CLINICAL IMPRESSION: Patient session focused on internal manual work to decrease tension at pelvic floor for decreased pain with intercourse, education of dilators, lubricnat and moisturizer in more depth. Pt denied additional questions. Pt did have less tension at internal pelvic  floor than eval with less TTP as well however did still have tension at Lt side and this released well. Pt would benefit from additional PT to further address deficits.     OBJECTIVE IMPAIRMENTS decreased coordination, decreased endurance, increased fascial restrictions, impaired flexibility, improper body mechanics, postural dysfunction, and pain.   ACTIVITY LIMITATIONS  intercourse  PARTICIPATION LIMITATIONS: interpersonal relationship  PERSONAL FACTORS Time since onset of injury/illness/exacerbation and 1 comorbidity: medical history  are also affecting patient's functional outcome.   REHAB POTENTIAL: Good  CLINICAL DECISION MAKING: Stable/uncomplicated  EVALUATION COMPLEXITY: Low   GOALS: Goals reviewed with patient? Yes  SHORT TERM GOALS: Target date: 09/23/2021  Pt to be I with HEP.  Baseline: Goal status: MET  2.  Pt to be I with abdominal massage and pelvic relaxation techniques for improved pain levels.  Baseline:  Goal status: on going  3.  Pt to report no more than 5/10 pain with intercourse for improved tolerance to vaginal penetration.  Baseline:  Goal status: on going   LONG TERM GOALS: Target date:  11/26/21    Pt to be I with advanced HEP.  Baseline:  Goal status: INITIAL  2.  Pt to report no more than 2/10 pain with vaginal penetration for improved tolerance to intercourse and medical exams.  Baseline:  Goal status: INITIAL  3.  Pt to be I with moisturizer use and report improvement of vaginal dryness by at least 50% for improved QOL.  Baseline:  Goal status: INITIAL  PLAN: PT FREQUENCY: every other week  PT DURATION:  6 sessions  PLANNED INTERVENTIONS: Therapeutic exercises, Therapeutic activity, Neuromuscular re-education, Patient/Family education, Self Care, Joint mobilization, Dry Needling, Spinal mobilization, Cryotherapy, Moist heat, Taping, Biofeedback, and Manual therapy  PLAN FOR NEXT SESSION: breathing mechanics, pelvic relaxation,  voiding mechanics, stretching of hips/back  Stacy Gardner, PT, DPT 09/06/239:26 AM

## 2021-10-14 ENCOUNTER — Other Ambulatory Visit: Payer: Self-pay

## 2021-10-14 ENCOUNTER — Other Ambulatory Visit: Payer: Self-pay | Admitting: Hematology

## 2021-10-14 ENCOUNTER — Inpatient Hospital Stay: Payer: BC Managed Care – PPO | Attending: Hematology

## 2021-10-14 VITALS — BP 107/74 | HR 91 | Temp 98.5°F | Resp 18

## 2021-10-14 DIAGNOSIS — Z9012 Acquired absence of left breast and nipple: Secondary | ICD-10-CM | POA: Diagnosis not present

## 2021-10-14 DIAGNOSIS — Z87440 Personal history of urinary (tract) infections: Secondary | ICD-10-CM | POA: Diagnosis not present

## 2021-10-14 DIAGNOSIS — Z95828 Presence of other vascular implants and grafts: Secondary | ICD-10-CM

## 2021-10-14 DIAGNOSIS — Z170 Estrogen receptor positive status [ER+]: Secondary | ICD-10-CM | POA: Insufficient documentation

## 2021-10-14 DIAGNOSIS — C50412 Malignant neoplasm of upper-outer quadrant of left female breast: Secondary | ICD-10-CM | POA: Diagnosis present

## 2021-10-14 DIAGNOSIS — Z5111 Encounter for antineoplastic chemotherapy: Secondary | ICD-10-CM | POA: Diagnosis present

## 2021-10-14 DIAGNOSIS — C773 Secondary and unspecified malignant neoplasm of axilla and upper limb lymph nodes: Secondary | ICD-10-CM | POA: Diagnosis not present

## 2021-10-14 MED ORDER — GOSERELIN ACETATE 3.6 MG ~~LOC~~ IMPL
3.6000 mg | DRUG_IMPLANT | Freq: Once | SUBCUTANEOUS | Status: AC
  Administered 2021-10-14: 3.6 mg via SUBCUTANEOUS
  Filled 2021-10-14: qty 3.6

## 2021-10-18 ENCOUNTER — Encounter: Payer: Self-pay | Admitting: Hematology

## 2021-10-19 ENCOUNTER — Ambulatory Visit (HOSPITAL_COMMUNITY)
Admission: RE | Admit: 2021-10-19 | Discharge: 2021-10-19 | Disposition: A | Discharge status: Home / Self Care | Payer: BC Managed Care – PPO | Source: Ambulatory Visit | Attending: Family | Admitting: Family

## 2021-10-19 ENCOUNTER — Other Ambulatory Visit (HOSPITAL_COMMUNITY): Payer: Self-pay

## 2021-10-19 DIAGNOSIS — R109 Unspecified abdominal pain: Secondary | ICD-10-CM | POA: Diagnosis present

## 2021-10-19 DIAGNOSIS — G8929 Other chronic pain: Secondary | ICD-10-CM | POA: Diagnosis present

## 2021-10-19 DIAGNOSIS — R10A2 Flank pain, left side: Secondary | ICD-10-CM

## 2021-10-20 ENCOUNTER — Encounter: Payer: Self-pay | Admitting: Family

## 2021-10-20 ENCOUNTER — Ambulatory Visit (INDEPENDENT_AMBULATORY_CARE_PROVIDER_SITE_OTHER): Payer: BC Managed Care – PPO | Admitting: Family

## 2021-10-20 VITALS — BP 110/58 | HR 100 | Temp 98.0°F | Ht 64.0 in | Wt 125.2 lb

## 2021-10-20 DIAGNOSIS — R300 Dysuria: Secondary | ICD-10-CM

## 2021-10-20 DIAGNOSIS — N390 Urinary tract infection, site not specified: Secondary | ICD-10-CM

## 2021-10-20 LAB — POCT URINALYSIS DIPSTICK
Bilirubin, UA: NEGATIVE
Blood, UA: NEGATIVE
Glucose, UA: NEGATIVE
Ketones, UA: NEGATIVE
Leukocytes, UA: NEGATIVE
Nitrite, UA: NEGATIVE
Protein, UA: NEGATIVE
Spec Grav, UA: 1.015 (ref 1.010–1.025)
Urobilinogen, UA: 0.2 E.U./dL
pH, UA: 6 (ref 5.0–8.0)

## 2021-10-20 MED ORDER — PHENAZOPYRIDINE HCL 200 MG PO TABS: 200.0000 mg | ORAL_TABLET | Freq: Three times a day (TID) | ORAL | 0 refills | Status: DC | PRN

## 2021-10-20 NOTE — Progress Notes (Signed)
Patient ID: Pamela Reyes, female    DOB: 08-30-1980, 41 y.o.   MRN: [id number]  Chief Complaint  Patient presents with   Urinary Tract Infection    seen in UC, still having sx    HPI: Urinary symptoms: Patient c/o  dysuria, frequency, urgency. Denies:  flank pain, pelvic pain, low back pain, foul odor, cloudy urine, hematuria. Other sx: vaginal d/c: none, vaginal itching: none. Duration of sx: 5 days; Home tx: [place], OTC; Denies  nausea, fever. Reports last UTI 3 weeks ago. She had intercourse one time, took the '50mg'$  Macrobid before and after and states she went to urgent care on Monday because she took an at home UTI test and positive, they gave her Cipro x7d and she is on day 3 & worried it is not working.   Assessment & Plan:   Problem List Items Addressed This Visit   None Visit Diagnoses     Dysuria    -  Primary UA neg today. was taking OTC pyridium last 2 days, has run out, will send RX refill.    Relevant Medications   phenazopyridine (PYRIDIUM) 200 MG tablet   Other Relevant Orders   POCT Urinalysis Dipstick (Completed)   Ambulatory referral to Urogynecology   Postcoital UTI    - seen in UC for another UTI, prophylactic Macrobid did not work, pt afraid to have intercourse, sending referral to urogyno to further assess. Advised pt to continue the Cipro.    Relevant Medications   phenazopyridine (PYRIDIUM) 200 MG tablet   Other Relevant Orders   Ambulatory referral to Urogynecology      Subjective:    Outpatient Medications Prior to Visit  Medication Sig Dispense Refill   abemaciclib (VERZENIO) 100 MG tablet Take 1 tablet (100 mg total) by mouth 2 (two) times daily. 56 tablet 0   acetaminophen (TYLENOL) 500 MG tablet Take 500-1,000 mg by mouth every 6 (six) hours as needed for moderate pain or headache.     Budesonide ER 9 MG TB24 Take 1 tablet by mouth daily. Start on first day of Verzenio. 30 tablet 0   Cholecalciferol (DIALYVITE VITAMIN D 5000) 125 MCG (5000  UT) capsule Take 5,000 Units by mouth daily. Vit D and K supplement combined     ciprofloxacin (CIPRO) 250 MG tablet Take 500 mg by mouth 2 (two) times daily.     exemestane (AROMASIN) 25 MG tablet Take 1 tablet by mouth daily after breakfast. 30 tablet 2   famotidine (PEPCID) 10 MG tablet Take 10 mg by mouth daily as needed for heartburn or indigestion.     Goserelin Acetate (ZOLADEX [place]) Inject into the skin every 30 (thirty) days.     hydrOXYzine (ATARAX) 10 MG tablet Take 1 tablet (10 mg total) by mouth 3 (three) times daily as needed. Start at night time due to risks of drowsiness. 30 tablet 0   loratadine (CLARITIN) 10 MG tablet Take 10 mg by mouth daily.     nitrofurantoin (MACRODANTIN) 50 MG capsule Take 1 capsule (50 mg total) by mouth as directed. Take 1 hour prior or immediately after sexual intercourse. 20 capsule 2   tretinoin (RETIN-A) 0.025 % cream Apply 1 application. topically at bedtime.     No facility-administered medications prior to visit.   Past Medical History:  Diagnosis Date   Anxiety    Axillary pain, right 01/18/2021   Will get right axillary ultrasound.   Breast cancer ([place])    Breast pain, left 10/06/2020  Encounter for gynecological examination with Papanicolaou smear of cervix 02/10/2021   Encounter for screening fecal occult blood testing 02/10/2021   Family history of breast cancer 11/11/2020   Fibroid 10/27/2020   Has 1 x .9 x .6 cm subserosal fibroid   History of hiatal hernia    Left breast mass 10/06/2020   +breast cancer   LLQ pain 10/06/2020   PONV (postoperative nausea and vomiting)    Port-A-Cath in place 03/25/2021   Past Surgical History:  Procedure Laterality Date   AXILLARY LYMPH NODE DISSECTION Left 07/07/2021   Procedure: LEFT AXILLARY LYMPH NODE DISSECTION;  Surgeon: Stark Klein, MD;  Location: [place];  Service: General;  Laterality: Left;   BREAST RECONSTRUCTION WITH PLACEMENT OF TISSUE EXPANDER AND FLEX HD (ACELLULAR  HYDRATED DERMIS) Left 01/06/2021   Procedure: LEFT BREAST RECONSTRUCTION WITH PLACEMENT OF TISSUE EXPANDER AND ACELLULAR  DERMIS;  Surgeon: Irene Limbo, MD;  Location: [place];  Service: Plastics;  Laterality: Left;   MASTECTOMY W/ SENTINEL NODE BIOPSY Left 01/06/2021   Procedure: LEFT MASTECTOMY WITH MAGTRACE;  Surgeon: Stark Klein, MD;  Location: [place];  Service: General;  Laterality: Left;   PORT-A-CATH REMOVAL Right 07/07/2021   Procedure: REMOVAL PORT-A-CATH;  Surgeon: Stark Klein, MD;  Location: [place];  Service: General;  Laterality: Right;   PORTACATH PLACEMENT Right 01/19/2021   Procedure: INSERTION PORT-A-CATH;  Surgeon: Stark Klein, MD;  Location: [place];  Service: General;  Laterality: Right;   RADIOACTIVE SEED GUIDED AXILLARY SENTINEL LYMPH NODE Left 01/06/2021   Procedure: RADIOACTIVE SEED GUIDED SENTINEL LYMPH NODE BIOPSY;  Surgeon: Stark Klein, MD;  Location: [place];  Service: General;  Laterality: Left;   SENTINEL NODE BIOPSY Left 01/06/2021   Procedure: LEFT AXILLARY SENTINEL LYMPH  NODE BIOPSY;  Surgeon: Stark Klein, MD;  Location: [place];  Service: General;  Laterality: Left;   WISDOM TOOTH EXTRACTION     Allergies  Allergen Reactions   Prochlorperazine Other (See Comments)    Severe restlessness. Akathisia.      Objective:    Physical Exam Vitals and nursing note reviewed.  Constitutional:      Appearance: Normal appearance.  Cardiovascular:     Rate and Rhythm: Normal rate and regular rhythm.  Pulmonary:     Effort: Pulmonary effort is normal.     Breath sounds: Normal breath sounds.  Musculoskeletal:        General: Normal range of motion.  Skin:    General: Skin is warm and dry.  Neurological:     Mental Status: She is alert.  Psychiatric:        Mood and Affect: Mood normal.        Behavior: Behavior normal.    BP (!) 110/58 (BP Location: Left Arm, Patient Position: Sitting, Cuff Size: Large)   Pulse 100   Temp 98 F  (36.7 C) (Temporal)   Ht '5\' 4"'$  (1.626 m)   Wt 125 lb 3.2 oz (56.8 kg)   LMP  (LMP Unknown)   SpO2 97%   BMI 21.49 kg/m  Wt Readings from Last 3 Encounters:  10/20/21 125 lb 3.2 oz (56.8 kg)  09/30/21 125 lb 9.6 oz (57 kg)  09/27/21 128 lb (58.1 kg)      Jeanie Sewer, NP

## 2021-10-20 NOTE — Patient Instructions (Signed)
It was very nice to see you today!   Sent in RX for Pyridium to help with your urinary pain. Referral sent to our Urogynecology office, they will call you directly to schedule. Keep hydrating and finish the Cipro you are taking.      PLEASE NOTE:  If you had any lab tests please let us know if you have not heard back within a few days. You may see your results on MyChart before we have a chance to review them but we will give you a call once they are reviewed by [place]. If we ordered any referrals today, please let us know if you have not heard from their office within the next week.

## 2021-10-20 NOTE — Progress Notes (Signed)
Your ultrasound is negative for any abnormality. Hopefully this is reassuring.

## 2021-10-21 ENCOUNTER — Other Ambulatory Visit: Payer: Self-pay | Admitting: Hematology

## 2021-10-21 DIAGNOSIS — Z170 Estrogen receptor positive status [ER+]: Secondary | ICD-10-CM

## 2021-10-21 NOTE — Telephone Encounter (Signed)
Patient has apt with provider on 10/28/21. Will be refilled then if appropriate.

## 2021-10-22 ENCOUNTER — Other Ambulatory Visit: Payer: Self-pay | Admitting: Adult Health

## 2021-10-25 ENCOUNTER — Ambulatory Visit (INDEPENDENT_AMBULATORY_CARE_PROVIDER_SITE_OTHER): Payer: BC Managed Care – PPO | Admitting: Adult Health

## 2021-10-25 ENCOUNTER — Other Ambulatory Visit: Payer: BC Managed Care – PPO

## 2021-10-25 ENCOUNTER — Encounter: Payer: Self-pay | Admitting: Adult Health

## 2021-10-25 VITALS — BP 116/75 | HR 91 | Ht 64.0 in | Wt 130.0 lb

## 2021-10-25 DIAGNOSIS — R300 Dysuria: Secondary | ICD-10-CM

## 2021-10-25 DIAGNOSIS — R350 Frequency of micturition: Secondary | ICD-10-CM | POA: Insufficient documentation

## 2021-10-25 DIAGNOSIS — R3915 Urgency of urination: Secondary | ICD-10-CM | POA: Diagnosis not present

## 2021-10-25 HISTORY — DX: Dysuria: R30.0

## 2021-10-25 HISTORY — DX: Frequency of micturition: R35.0

## 2021-10-25 HISTORY — DX: Urgency of urination: R39.15

## 2021-10-25 LAB — POCT URINALYSIS DIPSTICK
Blood, UA: NEGATIVE
Glucose, UA: NEGATIVE
Ketones, UA: NEGATIVE
Nitrite, UA: NEGATIVE
Protein, UA: NEGATIVE

## 2021-10-25 NOTE — Progress Notes (Signed)
  Subjective:     Patient ID: Pamela Reyes, female   DOB: [birth year], 41 y.o.   MRN: [id number]  HPI Pamela Reyes is a 41 year old white female, with SO, G0P0, in complaining of urinary frequency and urgency and pressure and burning at end of stream. She has noticed after sex. She was seen at Urgent Care, and treated with Cipro, then called and told no bacteria in urine was then treated by PCP, with pyridium and that helps and told to take Macrobid if has sex. She does pelvic floor PT for pain with sex, since having breast cancer and was treated with chemo and radiation, and that helps.  Her PCP referred to URO/GYN but has not heard from them.   Last pap was 02/10/21 negative malignancy and HPV  PCP is S Hudnell NP  Review of Systems +urinary frequency    +urgency with urination Has pressure and burning at end of stream  Reviewed past medical,surgical, social and family history. Reviewed medications and allergies.  Objective:   Physical Exam BP 116/75 (BP Location: Right Arm, Patient Position: Sitting, Cuff Size: Normal)   Pulse 91   Ht '5\' 4"'$  (1.626 m)   Wt 130 lb (59 kg)   LMP  (LMP Unknown)   BMI 22.31 kg/m  urine dipstick trace leuks Skin warm and dry.Lungs: clear to ausculation bilaterally. Cardiovascular: regular rate and rhythm. Talk only at her request.    Fall risk is low  Upstream - 10/25/21 1559       Pregnancy Intention Screening   Does the patient want to become pregnant in the next year? No    Does the patient's partner want to become pregnant in the next year? No    Would the patient like to discuss contraceptive options today? No      Contraception Wrap Up   Current Method No Method - Other Reason   period stopped with chemo   End Method No Method - Other Reason   period stopped with chemo            Assessment:     1. Urgency of urination Will get UA C&S since finished antibiotic  - POCT Urinalysis Dipstick - Urine Culture - Urinalysis, Routine w reflex  microscopic - Ambulatory referral to Urology  2. Urinary frequency - Ambulatory referral to Urology  3. Burning with urination Finish pyridium  Try using peri bottle when voiding Will refer to urology, has some symptoms of IC and she was wondering about that too  - Ambulatory referral to Urology     Plan:     Follow up prn

## 2021-10-26 LAB — URINALYSIS, ROUTINE W REFLEX MICROSCOPIC
Bilirubin, UA: NEGATIVE
Glucose, UA: NEGATIVE
Ketones, UA: NEGATIVE
Nitrite, UA: NEGATIVE
Protein,UA: NEGATIVE
RBC, UA: NEGATIVE
Specific Gravity, UA: 1.011 (ref 1.005–1.030)
Urobilinogen, Ur: 0.2 mg/dL (ref 0.2–1.0)
pH, UA: 7 (ref 5.0–7.5)

## 2021-10-26 LAB — MICROSCOPIC EXAMINATION
Bacteria, UA: NONE SEEN
Casts: NONE SEEN /lpf
Epithelial Cells (non renal): NONE SEEN /hpf (ref 0–10)
RBC, Urine: NONE SEEN /hpf (ref 0–2)

## 2021-10-27 ENCOUNTER — Other Ambulatory Visit (HOSPITAL_COMMUNITY): Payer: Self-pay

## 2021-10-27 LAB — URINE CULTURE: Organism ID, Bacteria: NO GROWTH

## 2021-10-27 NOTE — Telephone Encounter (Signed)
Oral Chemotherapy Pharmacist Encounter  I spoke with patient for overview of: Verzenio for the treatment of early stage, high-risk ER/PR positive, HER-2 negative breast cancer in conjunction with exemestane and Zoladex, planned duration ~2 years.  Counseled patient on administration, dosing, side effects, monitoring, drug-food interactions, safe handling, storage, and disposal.  Patient will take Verzenio 170m tablets, 1 tablet by mouth twice daily without regard to food. Pending patient's tolerance to 100 mg strength, dose may be escalated to goal dose of 150 mg twice daily in subsequent cycles.  Patient knows to avoid grapefruit and grapefruit juice.  Verzenio start date: pending MD appt on 10/28/21  Adverse effects include but are not limited to: diarrhea, fatigue, nausea, abdominal pain, decreased blood counts, and increased liver function tests, and joint pains. Severe, life-threatening, and/or fatal interstitial lung disease (ILD) and/or pneumonitis may occur with CDK 4/6 inhibitors. Patient has anti-emetic on hand and knows to take it if nausea develops.   Patient will obtain anti diarrheal and alert the office of 4 or more loose stools above baseline. Patient will also be taking budesonide ER 9 mg tablets 1 tablet by mouth daily for the first month of Verzenio therapy. This has already been picked up by patient from the pharmacy. Other diarrhea management strategies also discussed with patient included dietary changes of eating bland, low-fiber meals in the beginning when diarrhea risk is highest with Verzenio. Also discussed avoiding foods that may upset stomach including spicy, fried and greasy foods.    Reviewed with patient importance of keeping a medication schedule and plan for any missed doses. No barriers to medication adherence identified.  Medication reconciliation performed and medication/allergy list updated. Updated medication list with supplements patient reported taking  (biotin, tKuwaittail mushroom, lions mane mushroom, and tart cherry). No drug-drug interactions exist between these supplements and Verzenio.  Insurance authorization for [employer]has been obtained. Patient's insurance requires that she fill Verzenio through [facility], nutritional Patient already has medication in hand.   All questions answered.  Ms. LKaltvoiced understanding and appreciation.   Medication education handout placed in mail for patient. Patient knows to call the office with questions or concerns. Oral Chemotherapy Clinic phone number provided to patient.   RLeron Reyes PharmD, BCPS, BAdvocate Good Samaritan HospitalHematology/Oncology Clinical Pharmacist [phone]/20/2023 12:58 PM

## 2021-10-28 ENCOUNTER — Inpatient Hospital Stay (HOSPITAL_BASED_OUTPATIENT_CLINIC_OR_DEPARTMENT_OTHER): Payer: BC Managed Care – PPO

## 2021-10-28 ENCOUNTER — Other Ambulatory Visit: Payer: Self-pay

## 2021-10-28 ENCOUNTER — Inpatient Hospital Stay (HOSPITAL_BASED_OUTPATIENT_CLINIC_OR_DEPARTMENT_OTHER): Payer: BC Managed Care – PPO | Admitting: Hematology

## 2021-10-28 VITALS — BP 126/63 | HR 91 | Temp 98.9°F | Resp 15 | Wt 127.9 lb

## 2021-10-28 DIAGNOSIS — Z170 Estrogen receptor positive status [ER+]: Secondary | ICD-10-CM

## 2021-10-28 DIAGNOSIS — Z5111 Encounter for antineoplastic chemotherapy: Secondary | ICD-10-CM | POA: Diagnosis not present

## 2021-10-28 DIAGNOSIS — C50412 Malignant neoplasm of upper-outer quadrant of left female breast: Secondary | ICD-10-CM | POA: Diagnosis not present

## 2021-10-28 LAB — CBC WITH DIFFERENTIAL (CANCER CENTER ONLY)
Abs Immature Granulocytes: 0.01 10*3/uL (ref 0.00–0.07)
Basophils Absolute: 0 10*3/uL (ref 0.0–0.1)
Basophils Relative: 1 %
Eosinophils Absolute: 0.1 10*3/uL (ref 0.0–0.5)
Eosinophils Relative: 2 %
HCT: 38.6 % (ref 36.0–46.0)
Hemoglobin: 13.2 g/dL (ref 12.0–15.0)
Immature Granulocytes: 0 %
Lymphocytes Relative: 25 %
Lymphs Abs: 0.8 10*3/uL (ref 0.7–4.0)
MCH: 30.1 pg (ref 26.0–34.0)
MCHC: 34.2 g/dL (ref 30.0–36.0)
MCV: 88.1 fL (ref 80.0–100.0)
Monocytes Absolute: 0.3 10*3/uL (ref 0.1–1.0)
Monocytes Relative: 8 %
Neutro Abs: 1.9 10*3/uL (ref 1.7–7.7)
Neutrophils Relative %: 64 %
Platelet Count: 207 10*3/uL (ref 150–400)
RBC: 4.38 MIL/uL (ref 3.87–5.11)
RDW: 13 % (ref 11.5–15.5)
WBC Count: 3 10*3/uL — ABNORMAL LOW (ref 4.0–10.5)
nRBC: 0 % (ref 0.0–0.2)

## 2021-10-28 LAB — CMP (CANCER CENTER ONLY)
ALT: 19 U/L (ref 0–44)
AST: 17 U/L (ref 15–41)
Albumin: 4.5 g/dL (ref 3.5–5.0)
Alkaline Phosphatase: 41 U/L (ref 38–126)
Anion gap: 5 (ref 5–15)
BUN: 15 mg/dL (ref 6–20)
CO2: 29 mmol/L (ref 22–32)
Calcium: 9.5 mg/dL (ref 8.9–10.3)
Chloride: 106 mmol/L (ref 98–111)
Creatinine: 0.64 mg/dL (ref 0.44–1.00)
GFR, Estimated: >60 mL/min (ref >60)
Glucose, Bld: 96 mg/dL (ref 70–99)
Potassium: 4.2 mmol/L (ref 3.5–5.1)
Sodium: 140 mmol/L (ref 135–145)
Total Bilirubin: 0.6 mg/dL (ref 0.3–1.2)
Total Protein: 6.9 g/dL (ref 6.5–8.1)

## 2021-10-28 NOTE — Progress Notes (Signed)
[facility]   [phone] Fax:([phone]   Clinic Follow up Note   Patient Care Team: Jeanie Sewer, NP as PCP - General (Family Medicine) Mauro Kaufmann, RN as Oncology Nurse Navigator Rockwell Germany, RN as Oncology Nurse Navigator Stark Klein, MD as Consulting Physician (General Surgery) [name], MD as Consulting Physician (Hematology) Kyung Rudd, MD as Consulting Physician (Radiation Oncology) Estill Dooms, NP as Nurse Practitioner (Obstetrics and Gynecology)  Date of Service:  10/28/2021  CHIEF COMPLAINT: f/u of left breast cancer  CURRENT THERAPY:  -AI, starting 07/21/21, currently exemestane -Zoladex, q4weeks, starting 07/07/21  ASSESSMENT & PLAN:  Pamela Reyes is a 41 y.o. pre-menopausal female with   1. 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 The mass has grown significantly since 2017. Biopsy on 11/05/20 confirmed invasive ductal carcinoma, with metastatic involvement of one lymph node. -staging CT CAP and bone scan on 11/19/20 showed no distant metastasis -she underwent left mastectomy on 01/06/21 under Dr. Barry Dienes. Pathology showed 6.5 cm invasive ductal carcinoma, grade 2, involving dermis of nipple without epidermal involvement. Margins were negative, but all 4 lymph nodes showed metastatic disease. -s/p 4 cycles AC 02/11/21 - 03/25/21 and Abraxane 04/08/21 - 06/24/21. -PET scan on 06/16/21 showed no convincing evidence of local recurrence or metastatic disease. -left ALND on 07/07/21 with Dr. Barry Dienes, path showed 1/11 positive nodes -s/p postmastectomy radiation by Dr. Lisbeth Renshaw 08/16/21 - 10/04/21. -she started Zoladex in 07/07/21 and letrozole on 07/21/21. She developed worsening joint pain and was switched to exemestane in early 10/2021.  -she reports she  is having recurrent joint pain and is having ongoing GU and pelvic/sexual concerns. She met with GYN on 9/18 and is scheduled to meet urology tomorrow, 9/22. I believe these side effects are related to her induced menopause from Zoladex. We discussed changing to tamoxifen and coming off Zoladex. While this is a weaker antiestrogen therapy, we need to take her quality of life into account as well. I encouraged her to stay on exemestane for a little longer before making a decision about changing treatment and about started Verzenio. I will see her back in 2 weeks for close f/u. -we discussed switching to tamoxifen if she has worsening arthritis    2. Vaginal tightness, recurrent UTI -She has a vaginal pain with intercourse, probably related to antiestrogen therapy -Continue pelvic physical therapy -She knows to use lubricate, vaginal estrogen will be the last resort if nothing else works  -she reports a history of UTI and has previously been on prophylactic macrobid without success. She will meet urology tomorrow, 9/22.    PLAN: -continue exemestane, she will star Verzenio this week  -urology consult tomorrow, 9/22 -Continue pelvic floor PT -Zoladex injection 10/5, will add lab and f/u to this visit -lab and f/u in 4 weeks   No problem-specific Assessment & Plan notes found for this encounter.   SUMMARY OF ONCOLOGIC HISTORY: Oncology History Overview Note   Cancer Staging  Malignant neoplasm of upper-outer quadrant of left breast in female, estrogen receptor positive ([place]) Staging form: Breast, AJCC 8th Edition - Clinical stage from 11/05/2020: Stage IIA (cT2, cN1, cM0, G1, ER+, PR+, HER2: Equivocal) - Signed by [name], MD on 11/10/2020 - Pathologic stage from 01/06/2021: Stage IB (pT3, pN2a, cM0, G2, ER+, PR+, HER2-) - Signed by [name], MD on  01/12/2021     Malignant neoplasm of upper-outer quadrant of left breast in female, estrogen receptor positive ([place])  10/28/2020 Mammogram    Bilateral Diagnostic Mammogram; Left Breast Ultrasound  IMPRESSION: 1. Suspicious palpable mass/hypoechoic area in the left breast at 12 o'clock retroareolar measuring 4.5 cm.   2.  Suspicious lymph node in the left axilla.   11/05/2020 Cancer Staging   Staging form: Breast, AJCC 8th Edition - Clinical stage from 11/05/2020: Stage IIA (cT2, cN1, cM0, G1, ER+, PR+, HER2: Equivocal) - Signed by [name], MD on 11/10/2020 Stage prefix: Initial diagnosis Histologic grading system: 3 grade system   11/05/2020 Pathology Results   Diagnosis 1. Breast, left, needle core biopsy, 12 o'clock subareolar, ribbon clip - INVASIVE MAMMARY CARCINOMA. SEE NOTE 1. Carcinoma measures 1.5 cm in greatest linear dimension and appears grade 2. 1. Immunohistochemical stain for E-cadherin is positive in the tumor cells, consistent with a ductal phenotype. 1. PROGNOSTIC INDICATORS Results: The tumor cells are EQUIVOCAL for Her2 (2+). Her2 by FISH will be performed and results reported separately. Estrogen Receptor: 70%, POSITIVE, STRONG-MODERATE STAINING INTENSITY Progesterone Receptor: 80%, POSITIVE, MODERATE STAINING INTENSITY Proliferation Marker Ki67: 1%  2. Lymph node, needle/core biopsy, left axilla, tribell clip - METASTATIC CARCINOMA TO A LYMPH NODE. SEE NOTE 2. Largest contiguous focus of metastatic carcinoma measures 0.3 cm. 2. PROGNOSTIC INDICATORS Results: The tumor cells are EQUIVOCAL for Her2 (2+). Her2 by FISH will be performed and results reported separately. Estrogen Receptor: 90%, POSITIVE, STRONG STAINING INTENSITY Progesterone Receptor: 95%, POSITIVE, STRONG STAINING INTENSITY Proliferation Marker Ki67: 1%   11/10/2020 Initial Diagnosis   Malignant neoplasm of upper-outer quadrant of left breast in female, estrogen receptor positive ([place])   11/19/2020 Imaging   EXAM: CT CHEST, ABDOMEN, AND PELVIS WITH CONTRAST  IMPRESSION: 1. Left breast mass  as previously described. 2. Enhancing  subcentimeter left axillary lymph nodes. 3. No evidence of distal metastatic disease. 4. Small hiatal hernia   11/19/2020 Imaging   EXAM: NUCLEAR MEDICINE WHOLE BODY BONE SCAN  IMPRESSION: No evidence of metastatic disease.   11/21/2020 Imaging   EXAM: BILATERAL BREAST MRI WITH AND WITHOUT CONTRAST  IMPRESSION: 1. Suspicious 1.3 centimeter mass in the anterior UPPER central RIGHT breast warranting tissue diagnosis. (Image 65 of series 7). 2. 4 millimeter possible satellite nodule posterior to the 1.3 centimeter mass in the RIGHT breast. (Image 63 of series 7). 3. Indeterminate oval mass in the UPPER central middle depth of the RIGHT breast warranting tissue diagnosis. (Image 60 of series 7). 4. Large area of enhancement in the central portion of the LEFT breast, involving all quadrants. There is associated enhancement and retraction of the LEFT nipple, with significantly smaller size of the LEFT breast. Enhancement spans at least 6.5 centimeters. 5. Enlarged, previously biopsied LEFT axillary lymph node.   11/24/2020 Genetic Testing   Negative hereditary cancer genetic testing: no pathogenic variants detected in [facility] BRCAPlus STAT Panel or [facility] CustomNext-Cancer +RNAinsight Panel.  The report dates are November 24, 2020 and November 27, 2020, respectively.   The BRCAplus panel offered by [employer] and includes sequencing and deletion/duplication analysis for the following 8 genes: ATM, BRCA1, BRCA2, CDH1, CHEK2, PALB2, PTEN, and TP53.  The CustomNext-Cancer+RNAinsight panel offered by [facility] includes sequencing and rearrangement analysis for the following 47 genes:  APC, ATM, AXIN2, BARD1, BMPR1A, BRCA1, BRCA2, BRIP1, CDH1, CDK4, CDKN2A, CHEK2, DICER1, EPCAM, GREM1, HOXB13, MEN1, MLH1, MSH2, MSH3, MSH6, MUTYH, NBN, NF1, NF2, NTHL1, PALB2, PMS2, POLD1, POLE, PTEN, RAD51C, RAD51D,  RECQL, RET, SDHA, SDHAF2, SDHB, SDHC, SDHD, SMAD4, SMARCA4, STK11, TP53, TSC1, TSC2, and VHL.   RNA data is routinely analyzed for use in variant interpretation for all genes.    11/27/2020 Pathology Results   Diagnosis Breast, right, needle core biopsy, anterior upper central - FIBROADENOMATOID AND FIBROCYSTIC CHANGES WITH CALCIFICATIONS - PSEUDOANGIOMATOUS STROMAL HYPERPLASIA - FOCAL PERIDUCTULAR CHRONIC INFLAMMATION - NO MALIGNANCY IDENTIFIED   01/06/2021 Cancer Staging   Staging form: Breast, AJCC 8th Edition - Pathologic stage from 01/06/2021: Stage IB (pT3, pN2a, cM0, G2, ER+, PR+, HER2-) - Signed by [name], MD on 01/12/2021 Stage prefix: Initial diagnosis Histologic grading system: 3 grade system Residual tumor (R): R0 - None   01/06/2021 Definitive Surgery   FINAL MICROSCOPIC DIAGNOSIS:   A. LYMPH NODE, LEFT AXILLARY, SENTINEL, EXCISION:  - Metastatic carcinoma to a lymph node (1/1)  - Focus of metastatic carcinoma measures 1.5 cm without evidence of extranodal extension   B. 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 BREAST, LEFT INFERIOLATERAL MARGIN, EXCISION:  - Benign fibroadipose tissue  - Negative for carcinoma   D. LYMPH NODE, LEFT AXILLARY #1, SENTINEL, EXCISION:  - Metastatic carcinoma to a lymph node (1/1)  - Focus of metastatic carcinoma measures 1.4 cm without evidence of extranodal extension   E. LYMPH NODE, LEFT AXILLARY #2, SENTINEL, EXCISION:  - Metastatic carcinoma to a lymph node (1/1)  - Focus of metastatic carcinoma measures 0.5 cm without evidence of extranodal extension   F. LYMPH NODE, LEFT AXILLARY #3, SENTINEL, EXCISION:  - Metastatic carcinoma to a lymph node (1/1)  - Focus of metastatic carcinoma measures 0.9 cm without evidence of extranodal extension   G. LYMPH NODE, LEFT AXILLARY #4, SENTINEL, EXCISION:  - Benign fibroadipose tissue,  negative for carcinoma  - Lymphoid tissue is not identified    02/11/2021 - 06/24/2021 Chemotherapy   Patient is on Treatment Plan : BREAST ADJUVANT DOSE DENSE AC q14d / PACLitaxel q7d     06/16/2021 PET scan   IMPRESSION: 1. Postsurgical change of left mastectomy and left axillary lymph node dissection with tissue expander in place. Mild hypermetabolic activity about the surgical site without suspicious focal hypermetabolic soft tissue nodularity, favored to reflect postsurgical change.   2. No convincing scintigraphic evidence of hypermetabolic local recurrence or metastatic disease within the neck, chest, abdomen or pelvis.      INTERVAL HISTORY:  RYELEE ALBEE is here for a follow up of breast cancer. She was last seen by NP Lacie on 09/30/21. She presents to the clinic alone. She reports she has had recurrent joint pain on exemestane, in addition to her continued GU and pelvic/sexual concerns.   All other systems were reviewed with the patient and are negative.  MEDICAL HISTORY:  Past Medical History:  Diagnosis Date   Anxiety    Axillary pain, right 01/18/2021   Will get right axillary ultrasound.   Breast cancer ([place])    Breast pain, left 10/06/2020   Encounter for gynecological examination with Papanicolaou smear of cervix 02/10/2021   Encounter for screening fecal occult blood testing 02/10/2021   Family history of breast cancer 11/11/2020   Fibroid 10/27/2020   Has 1 x .9 x .6 cm subserosal fibroid   History of hiatal hernia    Left breast  mass 10/06/2020   +breast cancer   LLQ pain 10/06/2020   PONV (postoperative nausea and vomiting)    Port-A-Cath in place 03/25/2021    SURGICAL HISTORY: Past Surgical History:  Procedure Laterality Date   AXILLARY LYMPH NODE DISSECTION Left 07/07/2021   Procedure: LEFT AXILLARY LYMPH NODE DISSECTION;  Surgeon: Stark Klein, MD;  Location: [facility];  Service: General;  Laterality: Left;   BREAST RECONSTRUCTION  WITH PLACEMENT OF TISSUE EXPANDER AND FLEX HD (ACELLULAR HYDRATED DERMIS) Left 01/06/2021   Procedure: LEFT BREAST RECONSTRUCTION WITH PLACEMENT OF TISSUE EXPANDER AND ACELLULAR  DERMIS;  Surgeon: Irene Limbo, MD;  Location: [facility];  Service: Plastics;  Laterality: Left;   MASTECTOMY W/ SENTINEL NODE BIOPSY Left 01/06/2021   Procedure: LEFT MASTECTOMY WITH MAGTRACE;  Surgeon: Stark Klein, MD;  Location: [facility];  Service: General;  Laterality: Left;   PORT-A-CATH REMOVAL Right 07/07/2021   Procedure: REMOVAL PORT-A-CATH;  Surgeon: Stark Klein, MD;  Location: [facility];  Service: General;  Laterality: Right;   PORTACATH PLACEMENT Right 01/19/2021   Procedure: INSERTION PORT-A-CATH;  Surgeon: Stark Klein, MD;  Location: [facility];  Service: General;  Laterality: Right;   RADIOACTIVE SEED GUIDED AXILLARY SENTINEL LYMPH NODE Left 01/06/2021   Procedure: RADIOACTIVE SEED GUIDED SENTINEL LYMPH NODE BIOPSY;  Surgeon: Stark Klein, MD;  Location: [facility];  Service: General;  Laterality: Left;   SENTINEL NODE BIOPSY Left 01/06/2021   Procedure: LEFT AXILLARY SENTINEL LYMPH  NODE BIOPSY;  Surgeon: Stark Klein, MD;  Location: [facility];  Service: General;  Laterality: Left;   WISDOM TOOTH EXTRACTION      I have reviewed the social history and family history with the patient and they are unchanged from previous note.  ALLERGIES:  is allergic to prochlorperazine.  MEDICATIONS:  Current Outpatient Medications  Medication Sig Dispense Refill   abemaciclib (VERZENIO) 100 MG tablet Take 1 tablet (100 mg total) by mouth 2 (two) times daily. (Patient not taking: Reported on 10/25/2021) 56 tablet 0   acetaminophen (TYLENOL) 500 MG tablet Take 500-1,000 mg by mouth every 6 (six) hours as needed for moderate pain or headache.     Biotin 1 MG CAPS Take by mouth.     Budesonide ER 9 MG TB24 Take 1 tablet by mouth daily. Start on first day of Verzenio. (Patient not taking: Reported on 10/25/2021) 30  tablet 0   Cholecalciferol (DIALYVITE VITAMIN D 5000) 125 MCG (5000 UT) capsule Take 5,000 Units by mouth daily. Vit D and K supplement combined     exemestane (AROMASIN) 25 MG tablet Take 1 tablet by mouth daily after breakfast. 30 tablet 2   famotidine (PEPCID) 10 MG tablet Take 10 mg by mouth daily as needed for heartburn or indigestion.     loratadine (CLARITIN) 10 MG tablet Take 10 mg by mouth daily.     nitrofurantoin (MACRODANTIN) 50 MG capsule Take 1 capsule (50 mg total) by mouth as directed. Take 1 hour prior or immediately after sexual intercourse. 20 capsule 2   tretinoin (RETIN-A) 0.025 % cream Apply 1 application. topically at bedtime.     UNABLE TO FIND Med Name: [place] Mushroom/ [place] TO FIND Med Name: [place]     No current facility-administered medications for this visit.    PHYSICAL EXAMINATION: ECOG PERFORMANCE STATUS: 1 - Symptomatic but completely ambulatory  Vitals:   10/28/21 0822  BP: 126/63  Pulse: 91  Resp: 15  Temp: 98.9 F (37.2 C)  SpO2: 100%   Wt Readings from Last 3 Encounters:  10/28/21 127 lb 14.4 oz (58 kg)  10/25/21 130 lb (59 kg)  10/20/21 125 lb 3.2 oz (56.8 kg)     GENERAL:alert, no distress and comfortable SKIN: skin color normal, no rashes or significant lesions EYES: normal, Conjunctiva are pink and non-injected, sclera clear  NEURO: alert & oriented x 3 with fluent speech  LABORATORY DATA:  I have reviewed the data as listed    Latest Ref Rng & Units 10/28/2021    8:04 AM 09/30/2021    8:28 AM 08/19/2021   10:53 AM  CBC  WBC 4.0 - 10.5 K/uL 3.0  2.3  3.4   Hemoglobin 12.0 - 15.0 g/dL 13.2  13.7  13.4   Hematocrit 36.0 - 46.0 % 38.6  39.2  39.0   Platelets 150 - 400 K/uL 207  204  211         Latest Ref Rng & Units 10/28/2021    8:04 AM 09/30/2021    8:28 AM 08/19/2021   10:53 AM  CMP  Glucose 70 - 99 mg/dL 96  96  89   BUN 6 - 20 mg/dL '15  11  17   ' Creatinine 0.44 - 1.00 mg/dL 0.64  0.49   0.66   Sodium 135 - 145 mmol/L 140  141  139   Potassium 3.5 - 5.1 mmol/L 4.2  4.3  4.5   Chloride 98 - 111 mmol/L 106  104  104   CO2 22 - 32 mmol/L '29  29  29   ' Calcium 8.9 - 10.3 mg/dL 9.5  10.4  9.9   Total Protein 6.5 - 8.1 g/dL 6.9  7.5  7.1   Total Bilirubin 0.3 - 1.2 mg/dL 0.6  0.6  0.5   Alkaline Phos 38 - 126 U/L 41  41  40   AST 15 - 41 U/L '17  17  18   ' ALT 0 - 44 U/L '19  14  19       ' RADIOGRAPHIC STUDIES: I have personally reviewed the radiological images as listed and agreed with the findings in the report. No results found.    No orders of the defined types were placed in this encounter.  All questions were answered. The patient knows to call the clinic with any problems, questions or concerns. No barriers to learning was detected. The total time spent in the appointment was 30 minutes.     [name], MD 10/28/2021   I, Wilburn Mylar, am acting as scribe for [name], MD.   I have reviewed the above documentation for accuracy and completeness, and I agree with the above.

## 2021-10-29 ENCOUNTER — Ambulatory Visit (INDEPENDENT_AMBULATORY_CARE_PROVIDER_SITE_OTHER): Payer: BC Managed Care – PPO | Admitting: Urology

## 2021-10-29 ENCOUNTER — Encounter: Payer: Self-pay | Admitting: Urology

## 2021-10-29 VITALS — BP 101/69 | HR 96 | Ht 64.0 in | Wt 125.0 lb

## 2021-10-29 DIAGNOSIS — R350 Frequency of micturition: Secondary | ICD-10-CM | POA: Diagnosis not present

## 2021-10-29 LAB — URINALYSIS, ROUTINE W REFLEX MICROSCOPIC
Bilirubin, UA: NEGATIVE
Glucose, UA: NEGATIVE
Ketones, UA: NEGATIVE
Leukocytes,UA: NEGATIVE
Nitrite, UA: NEGATIVE
Protein,UA: NEGATIVE
RBC, UA: NEGATIVE
Specific Gravity, UA: 1.01 (ref 1.005–1.030)
Urobilinogen, Ur: 0.2 mg/dL (ref 0.2–1.0)
pH, UA: 6 (ref 5.0–7.5)

## 2021-10-29 LAB — BLADDER SCAN AMB NON-IMAGING: Scan Result: 1

## 2021-10-29 MED ORDER — MIRABEGRON ER 25 MG PO TB24: 25.0000 mg | ORAL_TABLET | Freq: Every day | ORAL | 0 refills | Status: DC

## 2021-10-29 NOTE — Progress Notes (Signed)
Assessment: 1. Urinary frequency     Plan: I reviewed the patient's chart including office notes and lab results. I do not see evidence of a UTI based on her most recent culture.  Her symptoms are not consistent with interstitial cystitis at the present time. Trial of Myrbetriq 25 mg daily.  Samples given.  Use and side effects discussed. Bladder diet sheet provided. Discussed use of vaginal lubricants during intercourse to decrease mechanical irritation Return to office in 1 month.  Chief Complaint:  Chief Complaint  Patient presents with   Urinary Frequency    History of Present Illness:  Pamela Reyes is a 41 y.o. female who is seen in consultation from Derrek Monaco, NP for evaluation of urinary frequency.  She first had onset of symptoms approximately 6 weeks ago.  Her symptoms began after intercourse.  She had frequent urination, urgency, and dysuria.  She noted some gross hematuria as well.  She was seen in urgent care and diagnosed with a UTI.  Urine culture was negative by report.  She received antibiotics but did not see any change in her symptoms.  She reports her symptoms eventually improved.  She had a recurrence of her symptoms several weeks later again after intercourse.  She was seen in urgent care.  Urine culture was negative.  She was taking Macrobid after intercourse at that time.  She currently reports that her symptoms are mild.  She continues with urinary frequency.  No dysuria.  She does have dyspareunia and is undergoing pelvic floor physical therapy. She is on antiestrogen treatment for breast cancer. No history of frequent UTIs. Urine culture from 10/27/2021 showed no growth.   Past Medical History:  Past Medical History:  Diagnosis Date   Anxiety    Axillary pain, right 01/18/2021   Will get right axillary ultrasound.   Breast cancer ([place])    Breast pain, left 10/06/2020   Encounter for gynecological examination with Papanicolaou smear of cervix  02/10/2021   Encounter for screening fecal occult blood testing 02/10/2021   Family history of breast cancer 11/11/2020   Fibroid 10/27/2020   Has 1 x .9 x .6 cm subserosal fibroid   History of hiatal hernia    Left breast mass 10/06/2020   +breast cancer   LLQ pain 10/06/2020   PONV (postoperative nausea and vomiting)    Port-A-Cath in place 03/25/2021    Past Surgical History:  Past Surgical History:  Procedure Laterality Date   AXILLARY LYMPH NODE DISSECTION Left 07/07/2021   Procedure: LEFT AXILLARY LYMPH NODE DISSECTION;  Surgeon: Stark Klein, MD;  Location: [place];  Service: General;  Laterality: Left;   BREAST RECONSTRUCTION WITH PLACEMENT OF TISSUE EXPANDER AND FLEX HD (ACELLULAR HYDRATED DERMIS) Left 01/06/2021   Procedure: LEFT BREAST RECONSTRUCTION WITH PLACEMENT OF TISSUE EXPANDER AND ACELLULAR  DERMIS;  Surgeon: Irene Limbo, MD;  Location: [place];  Service: Plastics;  Laterality: Left;   MASTECTOMY W/ SENTINEL NODE BIOPSY Left 01/06/2021   Procedure: LEFT MASTECTOMY WITH MAGTRACE;  Surgeon: Stark Klein, MD;  Location: [place];  Service: General;  Laterality: Left;   PORT-A-CATH REMOVAL Right 07/07/2021   Procedure: REMOVAL PORT-A-CATH;  Surgeon: Stark Klein, MD;  Location: [place];  Service: General;  Laterality: Right;   PORTACATH PLACEMENT Right 01/19/2021   Procedure: INSERTION PORT-A-CATH;  Surgeon: Stark Klein, MD;  Location: [place];  Service: General;  Laterality: Right;   RADIOACTIVE SEED GUIDED AXILLARY SENTINEL LYMPH NODE Left 01/06/2021  Procedure: RADIOACTIVE SEED GUIDED SENTINEL LYMPH NODE BIOPSY;  Surgeon: Stark Klein, MD;  Location: [place];  Service: General;  Laterality: Left;   SENTINEL NODE BIOPSY Left 01/06/2021   Procedure: LEFT AXILLARY SENTINEL LYMPH  NODE BIOPSY;  Surgeon: Stark Klein, MD;  Location: [place];  Service: General;  Laterality: Left;   WISDOM TOOTH EXTRACTION      Allergies:  Allergies  Allergen  Reactions   Prochlorperazine Other (See Comments)    Severe restlessness. Akathisia.    Family History:  Family History  Problem Relation Age of Onset   Congestive Heart Failure Father    Alcohol abuse Father    Heart disease Father    Breast cancer Sister 22       ER+   Bladder Cancer Maternal Uncle        smoking hx   Cancer Maternal Uncle        kidney or prostate; dx after 60   Breast cancer Paternal Aunt        dx after 91; x2 paternal aunts   Stroke Maternal Grandmother    Lung cancer Maternal Grandfather        dx after 85; smoking hx   Breast cancer Paternal Grandmother        dx after 46    Social History:  Social History   Tobacco Use   Smoking status: Former    Packs/day: 0.25    Years: 4.00    Total pack years: 1.00    Types: Cigarettes    Quit date: 10/13/2001    Years since quitting: 20.0   Smokeless tobacco: Never   Tobacco comments:    I was a light smoker in my teens  Vaping Use   Vaping Use: Never used  Substance Use Topics   Alcohol use: Not Currently    Alcohol/week: 8.0 standard drinks of alcohol    Types: 8 Shots of liquor per week    Comment: 3-4 times a week for 12 years. Currently not drinking.   Drug use: Never    Review of symptoms:  Constitutional:  Negative for unexplained weight loss, night sweats, fever, chills ENT:  Negative for nose bleeds, sinus pain, painful swallowing CV:  Negative for chest pain, shortness of breath, exercise intolerance, palpitations, loss of consciousness Resp:  Negative for cough, wheezing, shortness of breath GI:  Negative for nausea, vomiting, diarrhea, bloody stools GU:  Positives noted in HPI; otherwise negative for urinary incontinence Neuro:  Negative for seizures, poor balance, limb weakness, slurred speech Psych:  Negative for lack of energy, depression, anxiety Endocrine:  Negative for polydipsia, polyuria, symptoms of hypoglycemia (dizziness, hunger, sweating) Hematologic:  Negative for  anemia, purpura, petechia, prolonged or excessive bleeding, use of anticoagulants  Allergic:  Negative for difficulty breathing or choking as a result of exposure to anything; no shellfish allergy; no allergic response (rash/itch) to materials, foods  Physical exam: BP 101/69   Pulse 96   Ht '5\' 4"'$  (1.626 m)   Wt 125 lb (56.7 kg)   BMI 21.46 kg/m  GENERAL APPEARANCE:  Well appearing, well developed, well nourished, NAD HEENT: Atraumatic, Normocephalic, oropharynx clear. NECK: Supple without lymphadenopathy or thyromegaly. LUNGS: Clear to auscultation bilaterally. HEART: Regular Rate and Rhythm without murmurs, gallops, or rubs. ABDOMEN: Soft, non-tender, No Masses. EXTREMITIES: Moves all extremities well.  Without clubbing, cyanosis, or edema. NEUROLOGIC:  Alert and oriented x 3, normal gait, CN II-XII grossly intact.  MENTAL STATUS:  Appropriate. BACK:  Non-tender to palpation.  No CVAT SKIN:  Warm, dry and intact.    Results: U/A: Dipstick negative  PVR = 1 mL

## 2021-10-29 NOTE — Progress Notes (Signed)
post void residual =25m

## 2021-11-01 ENCOUNTER — Encounter: Payer: Self-pay | Admitting: *Deleted

## 2021-11-01 ENCOUNTER — Ambulatory Visit: Payer: BC Managed Care – PPO | Admitting: Physical Therapy

## 2021-11-01 DIAGNOSIS — M6281 Muscle weakness (generalized): Secondary | ICD-10-CM

## 2021-11-01 DIAGNOSIS — R279 Unspecified lack of coordination: Secondary | ICD-10-CM

## 2021-11-01 DIAGNOSIS — Z483 Aftercare following surgery for neoplasm: Secondary | ICD-10-CM | POA: Diagnosis not present

## 2021-11-01 DIAGNOSIS — R293 Abnormal posture: Secondary | ICD-10-CM

## 2021-11-01 NOTE — Patient Instructions (Signed)

## 2021-11-01 NOTE — Therapy (Signed)
OUTPATIENT PHYSICAL THERAPY FEMALE PELVIC TREATMENT   Patient Name: Pamela Reyes MRN: [id number] DOB:29-Jul-1980, 41 y.o., female Today's Date: 11/01/2021   PT End of Session - 11/01/21 0804     Visit Number 9    Date for PT Re-Evaluation 12/27/21    Authorization Type [employer]    PT Start Time 0800    PT Stop Time 0840    PT Time Calculation (min) 40 min    Activity Tolerance Patient tolerated treatment well    Behavior During Therapy [facility] for tasks assessed/performed             Past Medical History:  Diagnosis Date   Anxiety    Axillary pain, right 01/18/2021   Will get right axillary ultrasound.   Breast cancer ([place])    Breast pain, left 10/06/2020   Encounter for gynecological examination with Papanicolaou smear of cervix 02/10/2021   Encounter for screening fecal occult blood testing 02/10/2021   Family history of breast cancer 11/11/2020   Fibroid 10/27/2020   Has 1 x .9 x .6 cm subserosal fibroid   History of hiatal hernia    Left breast mass 10/06/2020   +breast cancer   LLQ pain 10/06/2020   PONV (postoperative nausea and vomiting)    Port-A-Cath in place 03/25/2021   Past Surgical History:  Procedure Laterality Date   AXILLARY LYMPH NODE DISSECTION Left 07/07/2021   Procedure: LEFT AXILLARY LYMPH NODE DISSECTION;  Surgeon: Stark Klein, MD;  Location: [place];  Service: General;  Laterality: Left;   BREAST RECONSTRUCTION WITH PLACEMENT OF TISSUE EXPANDER AND FLEX HD (ACELLULAR HYDRATED DERMIS) Left 01/06/2021   Procedure: LEFT BREAST RECONSTRUCTION WITH PLACEMENT OF TISSUE EXPANDER AND ACELLULAR  DERMIS;  Surgeon: Irene Limbo, MD;  Location: [place];  Service: Plastics;  Laterality: Left;   MASTECTOMY W/ SENTINEL NODE BIOPSY Left 01/06/2021   Procedure: LEFT MASTECTOMY WITH MAGTRACE;  Surgeon: Stark Klein, MD;  Location: [place];  Service: General;  Laterality: Left;   PORT-A-CATH REMOVAL Right 07/07/2021   Procedure: REMOVAL PORT-A-CATH;   Surgeon: Stark Klein, MD;  Location: [place];  Service: General;  Laterality: Right;   PORTACATH PLACEMENT Right 01/19/2021   Procedure: INSERTION PORT-A-CATH;  Surgeon: Stark Klein, MD;  Location: [place];  Service: General;  Laterality: Right;   RADIOACTIVE SEED GUIDED AXILLARY SENTINEL LYMPH NODE Left 01/06/2021   Procedure: RADIOACTIVE SEED GUIDED SENTINEL LYMPH NODE BIOPSY;  Surgeon: Stark Klein, MD;  Location: [place];  Service: General;  Laterality: Left;   SENTINEL NODE BIOPSY Left 01/06/2021   Procedure: LEFT AXILLARY SENTINEL LYMPH  NODE BIOPSY;  Surgeon: Stark Klein, MD;  Location: [place];  Service: General;  Laterality: Left;   [place] EXTRACTION     Patient Active Problem List   Diagnosis Date Noted   Burning with urination 10/25/2021   Urinary frequency 10/25/2021   Urgency of urination 10/25/2021   Left flank pain, chronic 09/27/2021   Paresthesia 06/08/2021   Port-A-Cath in place 03/25/2021   Genetic testing 11/25/2020   Family history of breast cancer 11/11/2020   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 lymph node, left ([place]) 11/11/2020   Dense breasts 11/11/2020   Malignant neoplasm of upper-outer quadrant of left breast in female, estrogen receptor positive ([place]) 11/10/2020   Fibroid 10/27/2020    PCP: Maximiano Coss, NP  REFERRING PROVIDER: Stark Klein, MD  REFERRING DIAG: [phone] (ICD-10-CM) - Malignant neoplasm of upper-outer quadrant of left female breast C50.912 (ICD-10-CM) - Malignant  neoplasm of unspecified site of left female breast N89.8 (ICD-10-CM) - Other specified noninflammatory disorders of vagina  THERAPY DIAG:  Muscle weakness (generalized) - Plan: PT plan of care cert/re-cert  Abnormal posture - Plan: PT plan of care cert/re-cert  Unspecified lack of coordination - Plan: PT plan of care cert/re-cert  Rationale for Evaluation and Treatment Rehabilitation  ONSET DATE: 2-3 months ago  SUBJECTIVE:                                                                                                                                                                                            SUBJECTIVE STATEMENT: Pt reports she had been sexually actively and this feels much better, still has pain with penetration but once this improved during intercourse it felt better and pleased with this. Pt reports she saw a urologist and thinks she has IC but unsure about this. Does feel like she is going to bathroom every hour  Fluid intake: Yes: 64 oz     PAIN:  Are you having pain? No   PRECAUTIONS: actively having CA radiation treatment  WEIGHT BEARING RESTRICTIONS No  FALLS:  Has patient fallen in last 6 months? No  LIVING ENVIRONMENT: Lives with: lives with their spouse Lives in: House/apartment   OCCUPATION: used book Training and development officer  PLOF: Independent  PATIENT GOALS to have less pain at vaginal opening  PERTINENT HISTORY:  underwent left mastectomy on 01/06/21, 4 cycles AC 02/11/21 - 03/25/21,  additional 12 cycles 06/24/21, left ALND on 07/07/21 with Dr. Barry Dienes and had one out of 11 nodes positive,  Sexual abuse: No  BOWEL MOVEMENT Pain with bowel movement: No Type of bowel movement:Type (Bristol Stool Scale) 1, Frequency every 2-3 days, and Strain No Fully empty rectum: Yes:   Leakage: No Pads: No Fiber supplement: No  URINATION Pain with urination: No Fully empty bladder: No Stream: Strong Urgency: No Frequency: not quicker than every 2 hours Leakage:  no Pads: No  INTERCOURSE Pain with intercourse: Initial Penetration, During Penetration, and During Climax Ability to have vaginal penetration:  Yes: but painful Climax: painful  Marinoff Scale: 2/3  PREGNANCY Vaginal deliveries 0 Tearing No C-section deliveries 0 Currently pregnant No  PROLAPSE None    OBJECTIVE:   DIAGNOSTIC FINDINGS:    COGNITION:  Overall cognitive status: Within functional limits for tasks  assessed     SENSATION:  Light touch: Appears intact  Proprioception: Appears intact  MUSCLE LENGTH: Bil hamstrings and adductors limited by 25%                POSTURE: rounded shoulders  LUMBARAROM/PROM  A/PROM A/PROM  eval  Flexion Limited by 75%  Extension WFL  Right lateral flexion WFL  Left lateral flexion WFL  Right rotation Limited by 50%  Left rotation Limited by 50%   (Blank rows = not tested)  LOWER EXTREMITY ROM:  WFL  LOWER EXTREMITY MMT:  Hips grossly 4+/5; knees and ankles 5/5   PALPATION:   General  no TTP but mild fascial restrictions at abdomen in all quadrants                External Perineal Exam no TTP but did have mild dryness                              Internal Pelvic Floor mild TTP throughout pelvic floor at superficial layers and deep layers, mildly worse at Rt side compared to lt. Pt denied pain but reports she could feel the pressure/tension.   Patient confirms identification and approves PT to assess internal pelvic floor and treatment Yes  PELVIC MMT:   MMT eval  Vaginal 5/5; 9s isometrics; 8 reps  Internal Anal Sphincter   External Anal Sphincter   Puborectalis   Diastasis Recti   (Blank rows = not tested)        TONE: WFL  PROLAPSE: Not seen in hooklying   TODAY'S TREATMENT  11/01/2021: Manual work:  Fascial release throughout Rt side of abdomen with tension noted at Rt but nothing of note on Lt, pt did report minor pressure felt over bladder but not replicable of pressure or tension she felt last week. However did void bladder prior to session Self care; Pt educated on urge drill to improve bladder retraining and increase time from one hour voids to at least 2 for improved QOL and bladder habits. Pt does endorse she frequently has small bladder voids and goes about every hour sometimes (especially after sex) has a slight "pressure" with emptying urine but not pain.  Pt requesting information on IC, given handout on IC and  educated on this however pt states and understands she saw her urologist and that provider doesn't think she would have IC. Pt educated this is usually a chronic disease and PT plans to address pain/pressure related to pelvic tightness and abdominal tightness promoting relaxation and bladder retraining and decreased pelvic pain. Pt agreed.  Pt also educated on pelvic wand/vibrator and use and frequency  10/13/2021:   Manual work: Internal vaginal assessment with pt reporting she has not been able to do a lot of dilator use since last visit (maybe 3x per pt) and did have intercourse but it was painful with penetration despite lubricant use during and consistent moisturizer use outside of intercourse. Pt found to have greatly increased tension and lacking mobility at Lt bulbocavernosus compared to Rt side today, was slightly painful but no more than 3/10 per pt. With gentle overpressure and stretching this released well and improved mobility noted with pt reporting 1/10 pain. Pt reports she can tell most pain is at lt side during intercourse as well. Deeper muscle layer progressed to next with tension throughout felt mostly at lt obturator internus but no trigger points today. Pt tolerated gentle stretching well.  Self -care: pt educated on vaginal dilator consistent use of 2x per week to improve mobility of muscles at vaginal opening to decrease pain, continued HEP, and to attempt a relaxation practice with pelvic relaxation video given and pt agreed. Pt reported seeing how much stretching helped during session today pt  motivated to be more consistent with these things outside of PT pt just limited in the last couple of weeks with schedule.   09/15/2021:  Manual work: Internal vaginal assessment as she reports she had improvement with pain with intercourse but still there and wants to see if this can be helped with releasing muscle tension. Pt did have tightness at Lt obturator internus and iliococcygeus.  Released well with gentle overpressure and pt reports she felt better with not having any TTP at these muscles. This was overall better than eval. Pt educated on muscle relaxation techniques, breathing mechanics, and lubricant use for intercourse. Pt reports moisturizers have been helpful overall and has been having a lot less pain/dryness and less pain with intercourse however does still have some. Pt also educated on vaginal dilators use at home and agreed to attempt.       PATIENT EDUCATION:  Education details: [id number] Person educated: Patient Education method: Consulting civil engineer, Demonstration, Corporate treasurer cues, Verbal cues, and Handouts Education comprehension: verbalized understanding and returned demonstration   HOME EXERCISE PROGRAM: [id number]  ASSESSMENT:  CLINICAL IMPRESSION: Patient session focused on manual work to abdomen for decreased tension, pt responded well and tension released on Rt side, bladder moving well externally at end of session and education on urge drill, pelvic wand, pelvic relaxation. Pt denied additional questions. Pt would benefit from additional PT to further address deficits.     OBJECTIVE IMPAIRMENTS decreased coordination, decreased endurance, increased fascial restrictions, impaired flexibility, improper body mechanics, postural dysfunction, and pain.   ACTIVITY LIMITATIONS  intercourse  PARTICIPATION LIMITATIONS: interpersonal relationship  PERSONAL FACTORS Time since onset of injury/illness/exacerbation and 1 comorbidity: medical history  are also affecting patient's functional outcome.   REHAB POTENTIAL: Good  CLINICAL DECISION MAKING: Stable/uncomplicated  EVALUATION COMPLEXITY: Low   GOALS: Goals reviewed with patient? Yes  SHORT TERM GOALS: Target date: 09/23/2021  Pt to be I with HEP.  Baseline: Goal status: MET  2.  Pt to be I with abdominal massage and pelvic relaxation techniques for improved pain levels.  Baseline:  Goal status:  MET  3.  Pt to report no more than 5/10 pain with intercourse for improved tolerance to vaginal penetration.  Baseline:  Goal status: on going   LONG TERM GOALS: Target date:  11/26/21    Pt to be I with advanced HEP.  Baseline:  Goal status: INITIAL  2.  Pt to report no more than 2/10 pain with vaginal penetration for improved tolerance to intercourse and medical exams.  Baseline:  Goal status: INITIAL  3.  Pt to be I with moisturizer use and report improvement of vaginal dryness by at least 50% for improved QOL.  Baseline:  Goal status: MET  4. Pt to report improved time between bladder voids to at least 2.5 hours for improved bladder habits and QOL. Baseline:  Goal status: NEW  PLAN: PT FREQUENCY: every other week  PT DURATION:  4 sessions  PLANNED INTERVENTIONS: Therapeutic exercises, Therapeutic activity, Neuromuscular re-education, Patient/Family education, Self Care, Joint mobilization, Dry Needling, Spinal mobilization, Cryotherapy, Moist heat, Taping, Biofeedback, and Manual therapy  PLAN FOR NEXT SESSION: breathing mechanics, pelvic relaxation, voiding mechanics, stretching of hips/back  Stacy Gardner, PT, DPT 09/25/238:44 AM

## 2021-11-05 ENCOUNTER — Ambulatory Visit: Payer: Self-pay | Admitting: Radiation Oncology

## 2021-11-11 ENCOUNTER — Inpatient Hospital Stay (HOSPITAL_BASED_OUTPATIENT_CLINIC_OR_DEPARTMENT_OTHER): Payer: BC Managed Care – PPO | Admitting: Hematology

## 2021-11-11 ENCOUNTER — Other Ambulatory Visit: Payer: Self-pay

## 2021-11-11 ENCOUNTER — Encounter: Payer: Self-pay | Admitting: Hematology

## 2021-11-11 ENCOUNTER — Inpatient Hospital Stay: Payer: BC Managed Care – PPO

## 2021-11-11 ENCOUNTER — Inpatient Hospital Stay: Payer: BC Managed Care – PPO | Attending: Hematology

## 2021-11-11 VITALS — BP 106/55 | HR 72 | Temp 97.5°F | Resp 15 | Ht 64.0 in | Wt 126.5 lb

## 2021-11-11 DIAGNOSIS — Z79811 Long term (current) use of aromatase inhibitors: Secondary | ICD-10-CM | POA: Diagnosis not present

## 2021-11-11 DIAGNOSIS — Z5111 Encounter for antineoplastic chemotherapy: Secondary | ICD-10-CM | POA: Diagnosis present

## 2021-11-11 DIAGNOSIS — Z170 Estrogen receptor positive status [ER+]: Secondary | ICD-10-CM | POA: Diagnosis not present

## 2021-11-11 DIAGNOSIS — Z923 Personal history of irradiation: Secondary | ICD-10-CM | POA: Diagnosis not present

## 2021-11-11 DIAGNOSIS — Z9012 Acquired absence of left breast and nipple: Secondary | ICD-10-CM | POA: Diagnosis not present

## 2021-11-11 DIAGNOSIS — Z95828 Presence of other vascular implants and grafts: Secondary | ICD-10-CM

## 2021-11-11 DIAGNOSIS — Z87440 Personal history of urinary (tract) infections: Secondary | ICD-10-CM | POA: Insufficient documentation

## 2021-11-11 DIAGNOSIS — C50812 Malignant neoplasm of overlapping sites of left female breast: Secondary | ICD-10-CM | POA: Insufficient documentation

## 2021-11-11 DIAGNOSIS — C50412 Malignant neoplasm of upper-outer quadrant of left female breast: Secondary | ICD-10-CM | POA: Diagnosis not present

## 2021-11-11 DIAGNOSIS — C773 Secondary and unspecified malignant neoplasm of axilla and upper limb lymph nodes: Secondary | ICD-10-CM | POA: Diagnosis not present

## 2021-11-11 LAB — CMP (CANCER CENTER ONLY)
ALT: 10 U/L (ref 0–44)
AST: 12 U/L — ABNORMAL LOW (ref 15–41)
Albumin: 4.5 g/dL (ref 3.5–5.0)
Alkaline Phosphatase: 44 U/L (ref 38–126)
Anion gap: 4 — ABNORMAL LOW (ref 5–15)
BUN: 17 mg/dL (ref 6–20)
CO2: 29 mmol/L (ref 22–32)
Calcium: 9.5 mg/dL (ref 8.9–10.3)
Chloride: 106 mmol/L (ref 98–111)
Creatinine: 0.78 mg/dL (ref 0.44–1.00)
GFR, Estimated: >60 mL/min (ref >60)
Glucose, Bld: 97 mg/dL (ref 70–99)
Potassium: 4.4 mmol/L (ref 3.5–5.1)
Sodium: 139 mmol/L (ref 135–145)
Total Bilirubin: 0.4 mg/dL (ref 0.3–1.2)
Total Protein: 7.3 g/dL (ref 6.5–8.1)

## 2021-11-11 LAB — CBC WITH DIFFERENTIAL (CANCER CENTER ONLY)
Abs Immature Granulocytes: 0 10*3/uL (ref 0.00–0.07)
Basophils Absolute: 0 10*3/uL (ref 0.0–0.1)
Basophils Relative: 1 %
Eosinophils Absolute: 0.1 10*3/uL (ref 0.0–0.5)
Eosinophils Relative: 2 %
HCT: 39.2 % (ref 36.0–46.0)
Hemoglobin: 13.2 g/dL (ref 12.0–15.0)
Immature Granulocytes: 0 %
Lymphocytes Relative: 34 %
Lymphs Abs: 0.9 10*3/uL (ref 0.7–4.0)
MCH: 30.3 pg (ref 26.0–34.0)
MCHC: 33.7 g/dL (ref 30.0–36.0)
MCV: 90.1 fL (ref 80.0–100.0)
Monocytes Absolute: 0.2 10*3/uL (ref 0.1–1.0)
Monocytes Relative: 7 %
Neutro Abs: 1.5 10*3/uL — ABNORMAL LOW (ref 1.7–7.7)
Neutrophils Relative %: 56 %
Platelet Count: 215 10*3/uL (ref 150–400)
RBC: 4.35 MIL/uL (ref 3.87–5.11)
RDW: 13.4 % (ref 11.5–15.5)
WBC Count: 2.7 10*3/uL — ABNORMAL LOW (ref 4.0–10.5)
nRBC: 0 % (ref 0.0–0.2)

## 2021-11-11 MED ORDER — ABEMACICLIB 150 MG PO TABS: 150.0000 mg | ORAL_TABLET | Freq: Two times a day (BID) | ORAL | 0 refills | Status: DC

## 2021-11-11 MED ORDER — GOSERELIN ACETATE 3.6 MG ~~LOC~~ IMPL
3.6000 mg | DRUG_IMPLANT | Freq: Once | SUBCUTANEOUS | Status: AC
  Administered 2021-11-11: 3.6 mg via SUBCUTANEOUS
  Filled 2021-11-11: qty 3.6

## 2021-11-11 NOTE — Progress Notes (Signed)
Pamela Reyes   [phone] [phone]   Clinic Follow up Note   Patient Care Team: Jeanie Sewer, NP as PCP - General (Family Medicine) Mauro Kaufmann, RN as Oncology Nurse Navigator Rockwell Germany, RN as Oncology Nurse Navigator Stark Klein, MD as Consulting Physician (General Surgery) [name], MD as Consulting Physician (Hematology) Kyung Rudd, MD as Consulting Physician (Radiation Oncology) Estill Dooms, NP as Nurse Practitioner (Obstetrics and Gynecology)  Date of Service:  11/11/2021  CHIEF COMPLAINT: f/u of left breast cancer  CURRENT THERAPY:  -AI, starting 07/21/21, currently exemestane -Zoladex, q4weeks, starting 07/07/21 Melynda Keller, starting 10/28/21  ASSESSMENT & PLAN:  Pamela Reyes is a 41 y.o. pre-menopausal female with   1. 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 Biopsy on 11/05/20 confirmed invasive ductal carcinoma, with metastatic involvement of one lymph node. -staging CT CAP and bone scan on 11/19/20 showed no distant metastasis -she underwent left mastectomy on 01/06/21 under Dr. Barry Dienes. Pathology showed 6.5 cm invasive ductal carcinoma, grade 2, involving dermis of nipple without epidermal involvement. Margins were negative, but all 4 lymph nodes showed metastatic disease. -s/p 4 cycles AC 02/11/21 - 03/25/21 and Abraxane 04/08/21 - 06/24/21. -PET scan on 06/16/21 showed no convincing evidence of local recurrence or metastatic disease. -left ALND on 07/07/21 with Dr. Barry Dienes, path showed 1/11 positive nodes -s/p postmastectomy radiation by Dr. Lisbeth Renshaw 08/16/21 - 10/04/21. -she started Zoladex in 07/07/21 and letrozole on 07/21/21. She developed worsening joint pain and was switched to exemestane in early 10/2021.  -she started Verzenio $RemoveBeforeDE'100mg'[id number]$  BID  on 10/28/21. Tolerating well so far with no noticeable side effects. 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 She is now followed by Dr. Felipa Eth. We again reviewed option of stopping Zoladex and switching exemestane to tamoxifen if she has persistent symptoms. I do still recommend continuing Zoladex injection and exemestane for a few more months before deciding, which she agrees with. -she is otherwise doing well, with less joint pain on exemestane and no side effects from Verzenio thus far. Labs reviewed, overall stable, ANC 1.5.   2. Vaginal tightness, recurrent UTI and dysuria -She has pelvic pain following intercourse, probably related to antiestrogen therapy -Continue pelvic physical therapy -She knows to use lubricate, vaginal estrogen will be the last resort if nothing else works  -she reports a history of UTI and has previously been on prophylactic macrobid without success. Dr. Felipa Eth prescribed myrbetriq for her to try.     PLAN: -We will proceed with Zoladex injection today and continue every 4 weeks -continue exemestane and Verzenio -Plan to increase Verzenio to 150 mg twice daily from second month, I called in prescription today -Continue pelvic floor PT -lab 10/19 -lab, Zoladex injection and survivorship visit 11/2, she will bring her partner with her on that visit    No problem-specific Assessment & Plan notes found for this encounter.   SUMMARY OF ONCOLOGIC HISTORY: Oncology History Overview Note   Cancer Staging  Malignant neoplasm of upper-outer quadrant of left breast in female, estrogen receptor positive ([place]) Staging form: Breast, AJCC 8th Edition - Clinical stage from 11/05/2020: Stage IIA (cT2, cN1, cM0, G1, ER+, PR+, HER2: Equivocal) - Signed by [name], MD on 11/10/2020 -  Pathologic stage from 01/06/2021: Stage IB (pT3, pN2a, cM0, G2, ER+, PR+, HER2-) -  Signed by [name], MD on 01/12/2021     Malignant neoplasm of upper-outer quadrant of left breast in female, estrogen receptor positive ([place])  10/28/2020 Mammogram   Bilateral Diagnostic Mammogram; Left Breast Ultrasound  IMPRESSION: 1. Suspicious palpable mass/hypoechoic area in the left breast at 12 o'clock retroareolar measuring 4.5 cm.   2.  Suspicious lymph node in the left axilla.   11/05/2020 Cancer Staging   Staging form: Breast, AJCC 8th Edition - Clinical stage from 11/05/2020: Stage IIA (cT2, cN1, cM0, G1, ER+, PR+, HER2: Equivocal) - Signed by [name], MD on 11/10/2020 Stage prefix: Initial diagnosis Histologic grading system: 3 grade system   11/05/2020 Pathology Results   Diagnosis 1. Breast, left, needle core biopsy, 12 o'clock subareolar, ribbon clip - INVASIVE MAMMARY CARCINOMA. SEE NOTE 1. Carcinoma measures 1.5 cm in greatest linear dimension and appears grade 2. 1. Immunohistochemical stain for E-cadherin is positive in the tumor cells, consistent with a ductal phenotype. 1. PROGNOSTIC INDICATORS Results: The tumor cells are EQUIVOCAL for Her2 (2+). Her2 by FISH will be performed and results reported separately. Estrogen Receptor: 70%, POSITIVE, STRONG-MODERATE STAINING INTENSITY Progesterone Receptor: 80%, POSITIVE, MODERATE STAINING INTENSITY Proliferation Marker Ki67: 1%  2. Lymph node, needle/core biopsy, left axilla, tribell clip - METASTATIC CARCINOMA TO A LYMPH NODE. SEE NOTE 2. Largest contiguous focus of metastatic carcinoma measures 0.3 cm. 2. PROGNOSTIC INDICATORS Results: The tumor cells are EQUIVOCAL for Her2 (2+). Her2 by FISH will be performed and results reported separately. Estrogen Receptor: 90%, POSITIVE, STRONG STAINING INTENSITY Progesterone Receptor: 95%, POSITIVE, STRONG STAINING INTENSITY Proliferation Marker Ki67: 1%   11/10/2020 Initial Diagnosis   Malignant neoplasm of upper-outer quadrant of left breast in female, estrogen  receptor positive ([place])   11/19/2020 Imaging   EXAM: CT CHEST, ABDOMEN, AND PELVIS WITH CONTRAST  IMPRESSION: 1. Left breast mass  as previously described. 2. Enhancing subcentimeter left axillary lymph nodes. 3. No evidence of distal metastatic disease. 4. Small hiatal hernia   11/19/2020 Imaging   EXAM: NUCLEAR MEDICINE WHOLE BODY BONE SCAN  IMPRESSION: No evidence of metastatic disease.   11/21/2020 Imaging   EXAM: BILATERAL BREAST MRI WITH AND WITHOUT CONTRAST  IMPRESSION: 1. Suspicious 1.3 centimeter mass in the anterior UPPER central RIGHT breast warranting tissue diagnosis. (Image 65 of series 7). 2. 4 millimeter possible satellite nodule posterior to the 1.3 centimeter mass in the RIGHT breast. (Image 63 of series 7). 3. Indeterminate oval mass in the UPPER central middle depth of the RIGHT breast warranting tissue diagnosis. (Image 60 of series 7). 4. Large area of enhancement in the central portion of the LEFT breast, involving all quadrants. There is associated enhancement and retraction of the LEFT nipple, with significantly smaller size of the LEFT breast. Enhancement spans at least 6.5 centimeters. 5. Enlarged, previously biopsied LEFT axillary lymph node.   11/24/2020 Genetic Testing   Negative hereditary cancer genetic testing: no pathogenic variants detected in [facility] BRCAPlus STAT Panel or [facility] CustomNext-Cancer +RNAinsight Panel.  The report dates are November 24, 2020 and November 27, 2020, respectively.   The BRCAplus panel offered by [employer] and includes sequencing and deletion/duplication analysis for the following 8 genes: ATM, BRCA1, BRCA2, CDH1, CHEK2, PALB2, PTEN, and TP53.  The CustomNext-Cancer+RNAinsight panel offered by [facility] includes sequencing and rearrangement analysis for the following 47 genes:  APC, ATM, AXIN2, BARD1, BMPR1A, BRCA1, BRCA2, BRIP1, CDH1, CDK4, CDKN2A, CHEK2, DICER1,  EPCAM, GREM1, HOXB13, MEN1, MLH1, MSH2, MSH3,  MSH6, MUTYH, NBN, NF1, NF2, NTHL1, PALB2, PMS2, POLD1, POLE, PTEN, RAD51C, RAD51D, RECQL, RET, SDHA, SDHAF2, SDHB, SDHC, SDHD, SMAD4, SMARCA4, STK11, TP53, TSC1, TSC2, and VHL.  RNA data is routinely analyzed for use in variant interpretation for all genes.    11/27/2020 Pathology Results   Diagnosis Breast, right, needle core biopsy, anterior upper central - FIBROADENOMATOID AND FIBROCYSTIC CHANGES WITH CALCIFICATIONS - PSEUDOANGIOMATOUS STROMAL HYPERPLASIA - FOCAL PERIDUCTULAR CHRONIC INFLAMMATION - NO MALIGNANCY IDENTIFIED   01/06/2021 Cancer Staging   Staging form: Breast, AJCC 8th Edition - Pathologic stage from 01/06/2021: Stage IB (pT3, pN2a, cM0, G2, ER+, PR+, HER2-) - Signed by [name], MD on 01/12/2021 Stage prefix: Initial diagnosis Histologic grading system: 3 grade system Residual tumor (R): R0 - None   01/06/2021 Definitive Surgery   FINAL MICROSCOPIC DIAGNOSIS:   A. LYMPH NODE, LEFT AXILLARY, SENTINEL, EXCISION:  - Metastatic carcinoma to a lymph node (1/1)  - Focus of metastatic carcinoma measures 1.5 cm without evidence of extranodal extension   B. 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 BREAST, LEFT INFERIOLATERAL MARGIN, EXCISION:  - Benign fibroadipose tissue  - Negative for carcinoma   D. LYMPH NODE, LEFT AXILLARY #1, SENTINEL, EXCISION:  - Metastatic carcinoma to a lymph node (1/1)  - Focus of metastatic carcinoma measures 1.4 cm without evidence of extranodal extension   E. LYMPH NODE, LEFT AXILLARY #2, SENTINEL, EXCISION:  - Metastatic carcinoma to a lymph node (1/1)  - Focus of metastatic carcinoma measures 0.5 cm without evidence of extranodal extension   F. LYMPH NODE, LEFT AXILLARY #3, SENTINEL, EXCISION:  - Metastatic carcinoma to a lymph node (1/1)   - Focus of metastatic carcinoma measures 0.9 cm without evidence of extranodal extension   G. LYMPH NODE, LEFT AXILLARY #4, SENTINEL, EXCISION:  - Benign fibroadipose tissue, negative for carcinoma  - Lymphoid tissue is not identified    02/11/2021 - 06/24/2021 Chemotherapy   Patient is on Treatment Plan : BREAST ADJUVANT DOSE DENSE AC q14d / PACLitaxel q7d     06/16/2021 PET scan   IMPRESSION: 1. Postsurgical change of left mastectomy and left axillary lymph node dissection with tissue expander in place. Mild hypermetabolic activity about the surgical site without suspicious focal hypermetabolic soft tissue nodularity, favored to reflect postsurgical change.   2. No convincing scintigraphic evidence of hypermetabolic local recurrence or metastatic disease within the neck, chest, abdomen or pelvis.      INTERVAL HISTORY:  Pamela Reyes is here for a follow up of breast cancer. She was last seen by me on 10/28/21. She presents to the clinic alone. She reports she is doing well overall. She denies any side effects from Verzenio so far. She also reports her joint pain is better on exemestane.   All other systems were reviewed with the patient and are negative.  MEDICAL HISTORY:  Past Medical History:  Diagnosis Date   Anxiety    Axillary pain, right 01/18/2021   Will get right axillary ultrasound.   Breast cancer ([place])    Breast pain, left 10/06/2020   Encounter for gynecological examination with Papanicolaou smear of cervix 02/10/2021   Encounter for screening fecal occult blood testing 02/10/2021   Family history of breast cancer 11/11/2020  Fibroid 10/27/2020   Has 1 x .9 x .6 cm subserosal fibroid   History of hiatal hernia    Left breast mass 10/06/2020   +breast cancer   LLQ pain 10/06/2020   PONV (postoperative nausea and vomiting)    Port-A-Cath in place 03/25/2021    SURGICAL HISTORY: Past Surgical History:  Procedure Laterality Date   AXILLARY LYMPH NODE DISSECTION  Left 07/07/2021   Procedure: LEFT AXILLARY LYMPH NODE DISSECTION;  Surgeon: Stark Klein, MD;  Location: [facility];  Service: General;  Laterality: Left;   BREAST RECONSTRUCTION WITH PLACEMENT OF TISSUE EXPANDER AND FLEX HD (ACELLULAR HYDRATED DERMIS) Left 01/06/2021   Procedure: LEFT BREAST RECONSTRUCTION WITH PLACEMENT OF TISSUE EXPANDER AND ACELLULAR  DERMIS;  Surgeon: Irene Limbo, MD;  Location: [facility];  Service: Plastics;  Laterality: Left;   MASTECTOMY W/ SENTINEL NODE BIOPSY Left 01/06/2021   Procedure: LEFT MASTECTOMY WITH MAGTRACE;  Surgeon: Stark Klein, MD;  Location: [facility];  Service: General;  Laterality: Left;   PORT-A-CATH REMOVAL Right 07/07/2021   Procedure: REMOVAL PORT-A-CATH;  Surgeon: Stark Klein, MD;  Location: [facility];  Service: General;  Laterality: Right;   PORTACATH PLACEMENT Right 01/19/2021   Procedure: INSERTION PORT-A-CATH;  Surgeon: Stark Klein, MD;  Location: [facility];  Service: General;  Laterality: Right;   RADIOACTIVE SEED GUIDED AXILLARY SENTINEL LYMPH NODE Left 01/06/2021   Procedure: RADIOACTIVE SEED GUIDED SENTINEL LYMPH NODE BIOPSY;  Surgeon: Stark Klein, MD;  Location: [facility];  Service: General;  Laterality: Left;   SENTINEL NODE BIOPSY Left 01/06/2021   Procedure: LEFT AXILLARY SENTINEL LYMPH  NODE BIOPSY;  Surgeon: Stark Klein, MD;  Location: [facility];  Service: General;  Laterality: Left;   WISDOM TOOTH EXTRACTION      I have reviewed the social history and family history with the patient and they are unchanged from previous note.  ALLERGIES:  is allergic to prochlorperazine.  MEDICATIONS:  Current Outpatient Medications  Medication Sig Dispense Refill   abemaciclib (VERZENIO) 150 MG tablet Take 1 tablet (150 mg total) by mouth 2 (two) times daily. 56 tablet 0   abemaciclib (VERZENIO) 100 MG tablet Take 1 tablet (100 mg total) by mouth 2 (two) times daily. 56 tablet 0   acetaminophen (TYLENOL) 500 MG tablet  Take 500-1,000 mg by mouth every 6 (six) hours as needed for moderate pain or headache.     Biotin 1 MG CAPS Take by mouth.     Budesonide ER 9 MG TB24 Take 1 tablet by mouth daily. Start on first day of Verzenio. 30 tablet 0   Cholecalciferol (DIALYVITE VITAMIN D 5000) 125 MCG (5000 UT) capsule Take 5,000 Units by mouth daily. Vit D and K supplement combined     exemestane (AROMASIN) 25 MG tablet Take 1 tablet by mouth daily after breakfast. 30 tablet 2   famotidine (PEPCID) 10 MG tablet Take 10 mg by mouth daily as needed for heartburn or indigestion.     GOSERELIN ACETATE [place]      loratadine (CLARITIN) 10 MG tablet Take 10 mg by mouth daily.     mirabegron ER (MYRBETRIQ) 25 MG TB24 tablet Take 1 tablet (25 mg total) by mouth daily. 28 tablet 0   nitrofurantoin (MACRODANTIN) 50 MG capsule Take 1 capsule (50 mg total) by mouth as directed. Take 1 hour prior or immediately after sexual intercourse. 20 capsule 2   tretinoin (RETIN-A) 0.025 % cream Apply 1 application. topically at bedtime.  UNABLE TO FIND Med Name: [place] [employer]/ [place] TO FIND Med Name: [place] [place]     No current facility-administered medications for this visit.    PHYSICAL EXAMINATION: ECOG PERFORMANCE STATUS: 0 - Asymptomatic  Vitals:   11/11/21 0812  BP: (!) 106/55  Pulse: 72  Resp: 15  Temp: (!) 97.5 F (36.4 C)  SpO2: 100%   Wt Readings from Last 3 Encounters:  11/11/21 126 lb 8 oz (57.4 kg)  10/29/21 125 lb (56.7 kg)  10/28/21 127 lb 14.4 oz (58 kg)     GENERAL:alert, no distress and comfortable SKIN: skin color normal, no rashes or significant lesions EYES: normal, Conjunctiva are pink and non-injected, sclera clear  NEURO: alert & oriented x 3 with fluent speech  LABORATORY DATA:  I have reviewed the data as listed    Latest Ref Rng & Units 11/11/2021    7:54 AM 10/28/2021    8:04 AM 09/30/2021    8:28 AM  CBC  WBC 4.0 - 10.5 K/uL 2.7  3.0  2.3   Hemoglobin 12.0 -  15.0 g/dL 13.2  13.2  13.7   Hematocrit 36.0 - 46.0 % 39.2  38.6  39.2   Platelets 150 - 400 K/uL 215  207  204         Latest Ref Rng & Units 10/28/2021    8:04 AM 09/30/2021    8:28 AM 08/19/2021   10:53 AM  CMP  Glucose 70 - 99 mg/dL 96  96  89   BUN 6 - 20 mg/dL _0 Creatinine 0.44 - 1.00 mg/dL 0.64  0.49  0.66   Sodium 135 - 145 mmol/L 140  141  139   Potassium 3.5 - 5.1 mmol/L 4.2  4.3  4.5   Chloride 98 - 111 mmol/L 106  104  104   CO2 22 - 32 mmol/L _1 Calcium 8.9 - 10.3 mg/dL 9.5  10.4  9.9   Total Protein 6.5 - 8.1 g/dL 6.9  7.5  7.1   Total Bilirubin 0.3 - 1.2 mg/dL 0.6  0.6  0.5   Alkaline Phos 38 - 126 U/L 41  41  40   AST 15 - 41 U/L _2 ALT 0 - 44 U/L _3 RADIOGRAPHIC STUDIES: I have personally reviewed the radiological images as listed and agreed with the findings in the report. No results found.    No orders of the defined types were placed in this encounter.  All questions were answered. The patient knows to call the clinic with any problems, questions or concerns. No barriers to learning was detected. The total time spent in the appointment was 30 minutes.     [name], MD 11/11/2021   I, Wilburn Mylar, am acting as scribe for [name], MD.   I have reviewed the above documentation for accuracy and completeness, and I agree with the above.

## 2021-11-12 LAB — CANCER ANTIGEN 27.29: CA 27.29: 22.1 U/mL (ref 0.0–38.6)

## 2021-11-20 ENCOUNTER — Other Ambulatory Visit: Payer: Self-pay | Admitting: Physician Assistant

## 2021-11-22 ENCOUNTER — Encounter: Payer: Self-pay | Admitting: Hematology

## 2021-11-22 ENCOUNTER — Other Ambulatory Visit: Payer: Self-pay | Admitting: Hematology

## 2021-11-23 ENCOUNTER — Other Ambulatory Visit: Payer: Self-pay

## 2021-11-23 ENCOUNTER — Encounter: Payer: Self-pay | Admitting: Urology

## 2021-11-23 ENCOUNTER — Ambulatory Visit: Payer: BC Managed Care – PPO | Admitting: Urology

## 2021-11-23 ENCOUNTER — Ambulatory Visit (INDEPENDENT_AMBULATORY_CARE_PROVIDER_SITE_OTHER): Payer: BC Managed Care – PPO | Admitting: Urology

## 2021-11-23 VITALS — BP 100/68 | HR 109 | Ht 64.0 in | Wt 126.5 lb

## 2021-11-23 DIAGNOSIS — R350 Frequency of micturition: Secondary | ICD-10-CM

## 2021-11-23 LAB — URINALYSIS, ROUTINE W REFLEX MICROSCOPIC
Bilirubin, UA: NEGATIVE
Glucose, UA: NEGATIVE
Ketones, UA: NEGATIVE
Leukocytes,UA: NEGATIVE
Nitrite, UA: NEGATIVE
Protein,UA: NEGATIVE
RBC, UA: NEGATIVE
Specific Gravity, UA: 1.01 (ref 1.005–1.030)
Urobilinogen, Ur: 0.2 mg/dL (ref 0.2–1.0)
pH, UA: 6 (ref 5.0–7.5)

## 2021-11-23 MED ORDER — MIRABEGRON ER 25 MG PO TB24: 25.0000 mg | ORAL_TABLET | Freq: Every day | ORAL | 11 refills | Status: DC

## 2021-11-23 MED ORDER — PHENAZOPYRIDINE HCL 200 MG PO TABS: 200.0000 mg | ORAL_TABLET | Freq: Three times a day (TID) | ORAL | 2 refills | Status: DC | PRN

## 2021-11-23 NOTE — Progress Notes (Signed)
Assessment: 1. Urinary frequency    Plan: Continue Myrbetriq 25 mg daily.  Samples and rx given.   Continue bladder diet  Rx for Pyridium provided for prn use Return to office in 3 months  Chief Complaint:  Chief Complaint  Patient presents with   Urinary Frequency    History of Present Illness:  Pamela Reyes is a 41 y.o. female who is seen for further evaluation of urinary frequency.  She first had onset of symptoms in August 2023.  Her symptoms began after intercourse.  She had frequent urination, urgency, and dysuria.  She noted some gross hematuria as well.  She was seen in urgent care and diagnosed with a UTI.  Urine culture was negative by report.  She received antibiotics but did not see any change in her symptoms.  She reports her symptoms eventually improved.  She had a recurrence of her symptoms several weeks later again after intercourse.  She was seen in urgent care.  Urine culture was negative.  She was taking Macrobid after intercourse at that time.  She currently reports that her symptoms are mild.  She continues with urinary frequency.  No dysuria.  She does have dyspareunia and is undergoing pelvic floor physical therapy. She is on antiestrogen treatment for breast cancer. No history of frequent UTIs. Urine culture from 10/27/2021 showed no growth. PVR = 1 ml. She was given a trial of Myrbetriq 25 mg daily in 9/23.  She returns today for follow-up.  She has noted improvement in her frequency with the Myrbetriq.  She had intercourse approximately 2 days ago and has not experienced symptoms at this point.  No dysuria or gross hematuria.  No side effects from Myrbetriq.  Portions of the above documentation were copied from a prior visit for review purposes only.    Past Medical History:  Past Medical History:  Diagnosis Date   Anxiety    Axillary pain, right 01/18/2021   Will get right axillary ultrasound.   Breast cancer ([place])    Breast pain, left 10/06/2020    Encounter for gynecological examination with Papanicolaou smear of cervix 02/10/2021   Encounter for screening fecal occult blood testing 02/10/2021   Family history of breast cancer 11/11/2020   Fibroid 10/27/2020   Has 1 x .9 x .6 cm subserosal fibroid   History of hiatal hernia    Left breast mass 10/06/2020   +breast cancer   LLQ pain 10/06/2020   PONV (postoperative nausea and vomiting)    Port-A-Cath in place 03/25/2021    Past Surgical History:  Past Surgical History:  Procedure Laterality Date   AXILLARY LYMPH NODE DISSECTION Left 07/07/2021   Procedure: LEFT AXILLARY LYMPH NODE DISSECTION;  Surgeon: Stark Klein, MD;  Location: [facility];  Service: General;  Laterality: Left;   BREAST RECONSTRUCTION WITH PLACEMENT OF TISSUE EXPANDER AND FLEX HD (ACELLULAR HYDRATED DERMIS) Left 01/06/2021   Procedure: LEFT BREAST RECONSTRUCTION WITH PLACEMENT OF TISSUE EXPANDER AND ACELLULAR  DERMIS;  Surgeon: Irene Limbo, MD;  Location: [facility];  Service: Plastics;  Laterality: Left;   MASTECTOMY W/ SENTINEL NODE BIOPSY Left 01/06/2021   Procedure: LEFT MASTECTOMY WITH MAGTRACE;  Surgeon: Stark Klein, MD;  Location: [facility];  Service: General;  Laterality: Left;   PORT-A-CATH REMOVAL Right 07/07/2021   Procedure: REMOVAL PORT-A-CATH;  Surgeon: Stark Klein, MD;  Location: [facility];  Service: General;  Laterality: Right;   PORTACATH PLACEMENT Right 01/19/2021   Procedure: INSERTION PORT-A-CATH;  Surgeon: Stark Klein, MD;  Location: [facility];  Service: General;  Laterality: Right;   RADIOACTIVE SEED GUIDED AXILLARY SENTINEL LYMPH NODE Left 01/06/2021   Procedure: RADIOACTIVE SEED GUIDED SENTINEL LYMPH NODE BIOPSY;  Surgeon: Stark Klein, MD;  Location: [facility];  Service: General;  Laterality: Left;   SENTINEL NODE BIOPSY Left 01/06/2021   Procedure: LEFT AXILLARY SENTINEL LYMPH  NODE BIOPSY;  Surgeon: Stark Klein, MD;  Location: [facility];  Service: General;   Laterality: Left;   WISDOM TOOTH EXTRACTION      Allergies:  Allergies  Allergen Reactions   Prochlorperazine Other (See Comments)    Severe restlessness. Akathisia.    Family History:  Family History  Problem Relation Age of Onset   Congestive Heart Failure Father    Alcohol abuse Father    Heart disease Father    Breast cancer Sister 46       ER+   Bladder Cancer Maternal Uncle        smoking hx   Cancer Maternal Uncle        kidney or prostate; dx after 33   Breast cancer Paternal Aunt        dx after 49; x2 paternal aunts   Stroke Maternal Grandmother    Lung cancer Maternal Grandfather        dx after 12; smoking hx   Breast cancer Paternal Grandmother        dx after 17    Social History:  Social History   Tobacco Use   Smoking status: Former    Packs/day: 0.25    Years: 4.00    Total pack years: 1.00    Types: Cigarettes    Quit date: 10/13/2001    Years since quitting: 20.1   Smokeless tobacco: Never   Tobacco comments:    I was a light smoker in my teens  Vaping Use   Vaping Use: Never used  Substance Use Topics   Alcohol use: Not Currently    Alcohol/week: 8.0 standard drinks of alcohol    Types: 8 Shots of liquor per week    Comment: 3-4 times a week for 12 years. Currently not drinking.   Drug use: Never    ROS: Constitutional:  Negative for fever, chills, weight loss CV: Negative for chest pain, previous MI, hypertension Respiratory:  Negative for shortness of breath, wheezing, sleep apnea, frequent cough GI:  Negative for nausea, vomiting, bloody stool, GERD  Physical exam: BP 100/68   Pulse (!) 109   Ht '5\' 4"'$  (1.626 m)   Wt 126 lb 8 oz (57.4 kg)   BMI 21.71 kg/m  GENERAL APPEARANCE:  Well appearing, well developed, well nourished, NAD HEENT:  Atraumatic, normocephalic, oropharynx clear NECK:  Supple without lymphadenopathy or thyromegaly ABDOMEN:  Soft, non-tender, no masses EXTREMITIES:  Moves all extremities well, without  clubbing, cyanosis, or edema NEUROLOGIC:  Alert and oriented x 3, normal gait, CN II-XII grossly intact MENTAL STATUS:  appropriate SKIN:  Warm, dry, and intact  Results: U/A: Dipstick negative

## 2021-11-25 ENCOUNTER — Ambulatory Visit: Payer: BC Managed Care – PPO | Attending: General Surgery | Admitting: Physical Therapy

## 2021-11-25 DIAGNOSIS — R279 Unspecified lack of coordination: Secondary | ICD-10-CM | POA: Diagnosis present

## 2021-11-25 DIAGNOSIS — M6281 Muscle weakness (generalized): Secondary | ICD-10-CM | POA: Diagnosis present

## 2021-11-25 DIAGNOSIS — M62838 Other muscle spasm: Secondary | ICD-10-CM | POA: Diagnosis present

## 2021-11-25 NOTE — Therapy (Signed)
OUTPATIENT PHYSICAL THERAPY FEMALE PELVIC TREATMENT   Patient Name: Pamela Reyes MRN: [id number] DOB:06-23-1980, 41 y.o., female Today's Date: 11/25/2021   PT End of Session - 11/25/21 0849     Visit Number 10   PF 5   Date for PT Re-Evaluation 12/27/21    Authorization Type [employer]    PT Start Time 0845    PT Stop Time 0927    PT Time Calculation (min) 42 min    Activity Tolerance Patient tolerated treatment well    Behavior During Therapy [facility] for tasks assessed/performed              Past Medical History:  Diagnosis Date   Anxiety    Axillary pain, right 01/18/2021   Will get right axillary ultrasound.   Breast cancer ([place])    Breast pain, left 10/06/2020   Encounter for gynecological examination with Papanicolaou smear of cervix 02/10/2021   Encounter for screening fecal occult blood testing 02/10/2021   Family history of breast cancer 11/11/2020   Fibroid 10/27/2020   Has 1 x .9 x .6 cm subserosal fibroid   History of hiatal hernia    Left breast mass 10/06/2020   +breast cancer   LLQ pain 10/06/2020   PONV (postoperative nausea and vomiting)    Port-A-Cath in place 03/25/2021   Past Surgical History:  Procedure Laterality Date   AXILLARY LYMPH NODE DISSECTION Left 07/07/2021   Procedure: LEFT AXILLARY LYMPH NODE DISSECTION;  Surgeon: Stark Klein, MD;  Location: [place];  Service: General;  Laterality: Left;   BREAST RECONSTRUCTION WITH PLACEMENT OF TISSUE EXPANDER AND FLEX HD (ACELLULAR HYDRATED DERMIS) Left 01/06/2021   Procedure: LEFT BREAST RECONSTRUCTION WITH PLACEMENT OF TISSUE EXPANDER AND ACELLULAR  DERMIS;  Surgeon: Irene Limbo, MD;  Location: [place];  Service: Plastics;  Laterality: Left;   MASTECTOMY W/ SENTINEL NODE BIOPSY Left 01/06/2021   Procedure: LEFT MASTECTOMY WITH MAGTRACE;  Surgeon: Stark Klein, MD;  Location: [place];  Service: General;  Laterality: Left;   PORT-A-CATH REMOVAL Right 07/07/2021   Procedure: REMOVAL  PORT-A-CATH;  Surgeon: Stark Klein, MD;  Location: [place];  Service: General;  Laterality: Right;   PORTACATH PLACEMENT Right 01/19/2021   Procedure: INSERTION PORT-A-CATH;  Surgeon: Stark Klein, MD;  Location: [place];  Service: General;  Laterality: Right;   RADIOACTIVE SEED GUIDED AXILLARY SENTINEL LYMPH NODE Left 01/06/2021   Procedure: RADIOACTIVE SEED GUIDED SENTINEL LYMPH NODE BIOPSY;  Surgeon: Stark Klein, MD;  Location: [place];  Service: General;  Laterality: Left;   SENTINEL NODE BIOPSY Left 01/06/2021   Procedure: LEFT AXILLARY SENTINEL LYMPH  NODE BIOPSY;  Surgeon: Stark Klein, MD;  Location: [place];  Service: General;  Laterality: Left;   Adrian EXTRACTION     Patient Active Problem List   Diagnosis Date Noted   Burning with urination 10/25/2021   Urinary frequency 10/25/2021   Urgency of urination 10/25/2021   Left flank pain, chronic 09/27/2021   Paresthesia 06/08/2021   Port-A-Cath in place 03/25/2021   Genetic testing 11/25/2020   Family history of breast cancer 11/11/2020   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 lymph node, left ([place]) 11/11/2020   Dense breasts 11/11/2020   Malignant neoplasm of upper-outer quadrant of left breast in female, estrogen receptor positive ([place]) 11/10/2020   Fibroid 10/27/2020    PCP: Maximiano Coss, NP  REFERRING PROVIDER: Stark Klein, MD  REFERRING DIAG: [phone] (ICD-10-CM) - Malignant neoplasm of upper-outer quadrant of left female breast  C50.912 (ICD-10-CM) - Malignant neoplasm of unspecified site of left female breast N89.8 (ICD-10-CM) - Other specified noninflammatory disorders of vagina  THERAPY DIAG:  Other muscle spasm  Muscle weakness (generalized)  Unspecified lack of coordination  Rationale for Evaluation and Treatment Rehabilitation  ONSET DATE: 2-3 months ago  SUBJECTIVE:                                                                                                                                                                                            SUBJECTIVE STATEMENT: Pt reports had intercourse and pain was better. Penetration still 7/10 but deeper penetration 3/10 and able to achieve climax which is an improvement. Pt reports she did get a wand, has used it a couple times.   Fluid intake: Yes: 64 oz     PAIN:  Are you having pain? No   PRECAUTIONS: actively having CA radiation treatment  WEIGHT BEARING RESTRICTIONS No  FALLS:  Has patient fallen in last 6 months? No  LIVING ENVIRONMENT: Lives with: lives with their spouse Lives in: House/apartment   OCCUPATION: used book Training and development officer  PLOF: Independent  PATIENT GOALS to have less pain at vaginal opening  PERTINENT HISTORY:  underwent left mastectomy on 01/06/21, 4 cycles AC 02/11/21 - 03/25/21,  additional 12 cycles 06/24/21, left ALND on 07/07/21 with Dr. Barry Dienes and had one out of 11 nodes positive,  Sexual abuse: No  BOWEL MOVEMENT Pain with bowel movement: No Type of bowel movement:Type ([place] Stool Scale) 1, Frequency every 2-3 days, and Strain No Fully empty rectum: Yes:   Leakage: No Pads: No Fiber supplement: No  URINATION Pain with urination: No Fully empty bladder: No Stream: Strong Urgency: No Frequency: not quicker than every 2 hours Leakage:  no Pads: No  INTERCOURSE Pain with intercourse: Initial Penetration, During Penetration, and During Climax Ability to have vaginal penetration:  Yes: but painful Climax: painful  Marinoff Scale: 2/3  PREGNANCY Vaginal deliveries 0 Tearing No C-section deliveries 0 Currently pregnant No  PROLAPSE None    OBJECTIVE:   DIAGNOSTIC FINDINGS:    COGNITION:  Overall cognitive status: Within functional limits for tasks assessed     SENSATION:  Light touch: Appears intact  Proprioception: Appears intact  MUSCLE LENGTH: Bil hamstrings and adductors limited by 25%                POSTURE: rounded  shoulders  LUMBARAROM/PROM  A/PROM A/PROM  eval  Flexion Limited by 75%  Extension WFL  Right lateral flexion WFL  Left lateral flexion WFL  Right rotation Limited by 50%  Left rotation Limited by 50%   (Blank  rows = not tested)  LOWER EXTREMITY ROM:  WFL  LOWER EXTREMITY MMT:  Hips grossly 4+/5; knees and ankles 5/5   PALPATION:   General  no TTP but mild fascial restrictions at abdomen in all quadrants                External Perineal Exam no TTP but did have mild dryness                              Internal Pelvic Floor mild TTP throughout pelvic floor at superficial layers and deep layers, mildly worse at Rt side compared to lt. Pt denied pain but reports she could feel the pressure/tension.   Patient confirms identification and approves PT to assess internal pelvic floor and treatment Yes  PELVIC MMT:   MMT eval  Vaginal 5/5; 9s isometrics; 8 reps  Internal Anal Sphincter   External Anal Sphincter   Puborectalis   Diastasis Recti   (Blank rows = not tested)        TONE: WFL  PROLAPSE: Not seen in hooklying   TODAY'S TREATMENT   11/25/2021:  Pt consented to internal vaginal treatment today. Does continue to have increased tension but overall improved since last internal work. Continues to be more tense at Rt compared to Lt, one trigger point noted at rt bulbocavernosus and one at Rt pubococcygeus both released well and pt reported she can feel improvement with mobility with gentle stretching with palpation at end of session. Pt educated during manual work on use of wand for at home for gentle stretching and trigger point release and denied additional questions.   11/01/2021: Manual work:  Fascial release throughout Rt side of abdomen with tension noted at Rt but nothing of note on Lt, pt did report minor pressure felt over bladder but not replicable of pressure or tension she felt last week. However did void bladder prior to session Self care; Pt educated  on urge drill to improve bladder retraining and increase time from one hour voids to at least 2 for improved QOL and bladder habits. Pt does endorse she frequently has small bladder voids and goes about every hour sometimes (especially after sex) has a slight "pressure" with emptying urine but not pain.  Pt requesting information on IC, given handout on IC and educated on this however pt states and understands she saw her urologist and that provider doesn't think she would have IC. Pt educated this is usually a chronic disease and PT plans to address pain/pressure related to pelvic tightness and abdominal tightness promoting relaxation and bladder retraining and decreased pelvic pain. Pt agreed.  Pt also educated on pelvic wand/vibrator and use and frequency   PATIENT EDUCATION:  Education details: [id number] Person educated: Patient Education method: Consulting civil engineer, Demonstration, Tactile cues, Verbal cues, and Handouts Education comprehension: verbalized understanding and returned demonstration   HOME EXERCISE PROGRAM: [id number]  ASSESSMENT:  CLINICAL IMPRESSION: Patient session focused on manual work to internal pelvic floor , pt responded well and tension released on Rt side and trigger points released. Pt reports she has been doing urge drill and notes frequently is better, started using wand and uses her dilators about once per week or every other week especially if not sexually active. But states intercourse over the past month has been better without bladder pain/no uti symptoms, and improved pain levels overall.  Pt denied additional questions. Pt would benefit from additional PT to further address  deficits.     OBJECTIVE IMPAIRMENTS decreased coordination, decreased endurance, increased fascial restrictions, impaired flexibility, improper body mechanics, postural dysfunction, and pain.   ACTIVITY LIMITATIONS  intercourse  PARTICIPATION LIMITATIONS: interpersonal relationship  PERSONAL  FACTORS Time since onset of injury/illness/exacerbation and 1 comorbidity: medical history  are also affecting patient's functional outcome.   REHAB POTENTIAL: Good  CLINICAL DECISION MAKING: Stable/uncomplicated  EVALUATION COMPLEXITY: Low   GOALS: Goals reviewed with patient? Yes  SHORT TERM GOALS: Target date: 09/23/2021  Pt to be I with HEP.  Baseline: Goal status: MET  2.  Pt to be I with abdominal massage and pelvic relaxation techniques for improved pain levels.  Baseline:  Goal status: MET  3.  Pt to report no more than 5/10 pain with intercourse for improved tolerance to vaginal penetration.  Baseline:  Goal status: on going   LONG TERM GOALS: Target date:  11/26/21    Pt to be I with advanced HEP.  Baseline:  Goal status: MET  2.  Pt to report no more than 2/10 pain with vaginal penetration for improved tolerance to intercourse and medical exams.  Baseline:  Goal status: on going  3.  Pt to be I with moisturizer use and report improvement of vaginal dryness by at least 50% for improved QOL.  Baseline:  Goal status: MET  4. Pt to report improved time between bladder voids to at least 2.5 hours for improved bladder habits and QOL. Baseline:  Goal status: met   PLAN: PT FREQUENCY: every other week  PT DURATION:  4 sessions  PLANNED INTERVENTIONS: Therapeutic exercises, Therapeutic activity, Neuromuscular re-education, Patient/Family education, Self Care, Joint mobilization, Dry Needling, Spinal mobilization, Cryotherapy, Moist heat, Taping, Biofeedback, and Manual therapy  PLAN FOR NEXT SESSION: breathing mechanics, pelvic relaxation, voiding mechanics, stretching of hips/back  Stacy Gardner, PT, DPT 10/19/239:31 AM

## 2021-11-26 ENCOUNTER — Other Ambulatory Visit (HOSPITAL_COMMUNITY): Payer: Self-pay

## 2021-11-29 ENCOUNTER — Inpatient Hospital Stay: Payer: BC Managed Care – PPO

## 2021-11-29 ENCOUNTER — Other Ambulatory Visit (HOSPITAL_COMMUNITY): Payer: Self-pay

## 2021-11-29 ENCOUNTER — Ambulatory Visit: Payer: BC Managed Care – PPO | Admitting: Hematology

## 2021-11-29 DIAGNOSIS — Z5111 Encounter for antineoplastic chemotherapy: Secondary | ICD-10-CM | POA: Diagnosis not present

## 2021-11-29 DIAGNOSIS — C50412 Malignant neoplasm of upper-outer quadrant of left female breast: Secondary | ICD-10-CM

## 2021-11-29 LAB — CMP (CANCER CENTER ONLY)
ALT: 12 U/L (ref 0–44)
AST: 14 U/L — ABNORMAL LOW (ref 15–41)
Albumin: 4.1 g/dL (ref 3.5–5.0)
Alkaline Phosphatase: 39 U/L (ref 38–126)
Anion gap: 5 (ref 5–15)
BUN: 20 mg/dL (ref 6–20)
CO2: 27 mmol/L (ref 22–32)
Calcium: 9.6 mg/dL (ref 8.9–10.3)
Chloride: 108 mmol/L (ref 98–111)
Creatinine: 0.83 mg/dL (ref 0.44–1.00)
GFR, Estimated: >60 mL/min (ref >60)
Glucose, Bld: 91 mg/dL (ref 70–99)
Potassium: 4.3 mmol/L (ref 3.5–5.1)
Sodium: 140 mmol/L (ref 135–145)
Total Bilirubin: 0.3 mg/dL (ref 0.3–1.2)
Total Protein: 6.7 g/dL (ref 6.5–8.1)

## 2021-11-29 LAB — CBC WITH DIFFERENTIAL (CANCER CENTER ONLY)
Abs Immature Granulocytes: 0.01 10*3/uL (ref 0.00–0.07)
Basophils Absolute: 0 10*3/uL (ref 0.0–0.1)
Basophils Relative: 1 %
Eosinophils Absolute: 0.1 10*3/uL (ref 0.0–0.5)
Eosinophils Relative: 2 %
HCT: 36.4 % (ref 36.0–46.0)
Hemoglobin: 12.4 g/dL (ref 12.0–15.0)
Immature Granulocytes: 0 %
Lymphocytes Relative: 36 %
Lymphs Abs: 1 10*3/uL (ref 0.7–4.0)
MCH: 31.7 pg (ref 26.0–34.0)
MCHC: 34.1 g/dL (ref 30.0–36.0)
MCV: 93.1 fL (ref 80.0–100.0)
Monocytes Absolute: 0.2 10*3/uL (ref 0.1–1.0)
Monocytes Relative: 8 %
Neutro Abs: 1.5 10*3/uL — ABNORMAL LOW (ref 1.7–7.7)
Neutrophils Relative %: 53 %
Platelet Count: 206 10*3/uL (ref 150–400)
RBC: 3.91 MIL/uL (ref 3.87–5.11)
RDW: 14.7 % (ref 11.5–15.5)
WBC Count: 2.9 10*3/uL — ABNORMAL LOW (ref 4.0–10.5)
nRBC: 0 % (ref 0.0–0.2)

## 2021-12-06 ENCOUNTER — Other Ambulatory Visit (HOSPITAL_COMMUNITY): Payer: Self-pay

## 2021-12-06 ENCOUNTER — Other Ambulatory Visit: Payer: Self-pay

## 2021-12-06 ENCOUNTER — Encounter: Payer: BC Managed Care – PPO | Admitting: Physical Therapy

## 2021-12-06 MED ORDER — EXEMESTANE 25 MG PO TABS: 25.0000 mg | ORAL_TABLET | Freq: Every day | ORAL | 0 refills | Status: DC

## 2021-12-09 ENCOUNTER — Inpatient Hospital Stay: Payer: BC Managed Care – PPO

## 2021-12-09 ENCOUNTER — Other Ambulatory Visit: Payer: Self-pay

## 2021-12-09 ENCOUNTER — Inpatient Hospital Stay: Payer: BC Managed Care – PPO | Attending: Hematology | Admitting: Nurse Practitioner

## 2021-12-09 ENCOUNTER — Encounter: Payer: Self-pay | Admitting: Nurse Practitioner

## 2021-12-09 VITALS — BP 131/78 | HR 94 | Temp 98.0°F | Resp 16 | Wt 128.1 lb

## 2021-12-09 VITALS — BP 131/78 | HR 94 | Temp 98.3°F | Resp 16

## 2021-12-09 DIAGNOSIS — Z9012 Acquired absence of left breast and nipple: Secondary | ICD-10-CM | POA: Diagnosis not present

## 2021-12-09 DIAGNOSIS — C773 Secondary and unspecified malignant neoplasm of axilla and upper limb lymph nodes: Secondary | ICD-10-CM | POA: Insufficient documentation

## 2021-12-09 DIAGNOSIS — Z803 Family history of malignant neoplasm of breast: Secondary | ICD-10-CM | POA: Insufficient documentation

## 2021-12-09 DIAGNOSIS — Z95828 Presence of other vascular implants and grafts: Secondary | ICD-10-CM

## 2021-12-09 DIAGNOSIS — Z170 Estrogen receptor positive status [ER+]: Secondary | ICD-10-CM | POA: Diagnosis not present

## 2021-12-09 DIAGNOSIS — Z5111 Encounter for antineoplastic chemotherapy: Secondary | ICD-10-CM | POA: Diagnosis present

## 2021-12-09 DIAGNOSIS — R0789 Other chest pain: Secondary | ICD-10-CM | POA: Diagnosis not present

## 2021-12-09 DIAGNOSIS — Z801 Family history of malignant neoplasm of trachea, bronchus and lung: Secondary | ICD-10-CM | POA: Diagnosis not present

## 2021-12-09 DIAGNOSIS — R0781 Pleurodynia: Secondary | ICD-10-CM | POA: Diagnosis not present

## 2021-12-09 DIAGNOSIS — Z8052 Family history of malignant neoplasm of bladder: Secondary | ICD-10-CM | POA: Insufficient documentation

## 2021-12-09 DIAGNOSIS — C50412 Malignant neoplasm of upper-outer quadrant of left female breast: Secondary | ICD-10-CM | POA: Diagnosis not present

## 2021-12-09 LAB — CBC WITH DIFFERENTIAL (CANCER CENTER ONLY)
Abs Immature Granulocytes: 0.01 10*3/uL (ref 0.00–0.07)
Basophils Absolute: 0 10*3/uL (ref 0.0–0.1)
Basophils Relative: 1 %
Eosinophils Absolute: 0.1 10*3/uL (ref 0.0–0.5)
Eosinophils Relative: 2 %
HCT: 35.7 % — ABNORMAL LOW (ref 36.0–46.0)
Hemoglobin: 12.7 g/dL (ref 12.0–15.0)
Immature Granulocytes: 0 %
Lymphocytes Relative: 30 %
Lymphs Abs: 0.9 10*3/uL (ref 0.7–4.0)
MCH: 32.2 pg (ref 26.0–34.0)
MCHC: 35.6 g/dL (ref 30.0–36.0)
MCV: 90.6 fL (ref 80.0–100.0)
Monocytes Absolute: 0.3 10*3/uL (ref 0.1–1.0)
Monocytes Relative: 9 %
Neutro Abs: 1.7 10*3/uL (ref 1.7–7.7)
Neutrophils Relative %: 58 %
Platelet Count: 198 10*3/uL (ref 150–400)
RBC: 3.94 MIL/uL (ref 3.87–5.11)
RDW: 14.3 % (ref 11.5–15.5)
WBC Count: 2.9 10*3/uL — ABNORMAL LOW (ref 4.0–10.5)
nRBC: 0 % (ref 0.0–0.2)

## 2021-12-09 LAB — CMP (CANCER CENTER ONLY)
ALT: 15 U/L (ref 0–44)
AST: 17 U/L (ref 15–41)
Albumin: 4.3 g/dL (ref 3.5–5.0)
Alkaline Phosphatase: 38 U/L (ref 38–126)
Anion gap: 5 (ref 5–15)
BUN: 13 mg/dL (ref 6–20)
CO2: 29 mmol/L (ref 22–32)
Calcium: 9.8 mg/dL (ref 8.9–10.3)
Chloride: 105 mmol/L (ref 98–111)
Creatinine: 0.86 mg/dL (ref 0.44–1.00)
GFR, Estimated: >60 mL/min (ref >60)
Glucose, Bld: 79 mg/dL (ref 70–99)
Potassium: 4.7 mmol/L (ref 3.5–5.1)
Sodium: 139 mmol/L (ref 135–145)
Total Bilirubin: 0.4 mg/dL (ref 0.3–1.2)
Total Protein: 7.2 g/dL (ref 6.5–8.1)

## 2021-12-09 MED ORDER — GOSERELIN ACETATE 3.6 MG ~~LOC~~ IMPL
3.6000 mg | DRUG_IMPLANT | Freq: Once | SUBCUTANEOUS | Status: AC
  Administered 2021-12-09: 3.6 mg via SUBCUTANEOUS
  Filled 2021-12-09: qty 3.6

## 2021-12-09 NOTE — Progress Notes (Signed)
CLINIC:  Survivorship   Patient Care Team: Jeanie Sewer, NP as PCP - General (Family Medicine) Mauro Kaufmann, RN as Oncology Nurse Navigator Rockwell Germany, RN as Oncology Nurse Navigator Stark Klein, MD as Consulting Physician (General Surgery) [name], MD as Consulting Physician (Hematology) Kyung Rudd, MD as Consulting Physician (Radiation Oncology) Estill Dooms, NP as Nurse Practitioner (Obstetrics and Gynecology) Alla Feeling, NP as Nurse Practitioner (Nurse Practitioner)   REASON FOR VISIT:  Routine follow-up post-treatment for breast cancer.  BRIEF ONCOLOGIC HISTORY:  Oncology History Overview Note   Cancer Staging  Malignant neoplasm of upper-outer quadrant of left breast in female, estrogen receptor positive (Pamela Reyes) Staging form: Breast, AJCC 8th Edition - Clinical stage from 11/05/2020: Stage IIA (cT2, cN1, cM0, G1, ER+, PR+, HER2: Equivocal) - Signed by [name], MD on 11/10/2020 - Pathologic stage from 01/06/2021: Stage IB (pT3, pN2a, cM0, G2, ER+, PR+, HER2-) - Signed by [name], MD on 01/12/2021     Malignant neoplasm of upper-outer quadrant of left breast in female, estrogen receptor positive ([place])  10/28/2020 Mammogram   Bilateral Diagnostic Mammogram; Left Breast Ultrasound  IMPRESSION: 1. Suspicious palpable mass/hypoechoic area in the left breast at 12 o'clock retroareolar measuring 4.5 cm.   2.  Suspicious lymph node in the left axilla.   11/05/2020 Cancer Staging   Staging form: Breast, AJCC 8th Edition - Clinical stage from 11/05/2020: Stage IIA (cT2, cN1, cM0, G1, ER+, PR+, HER2: Equivocal) - Signed by [name], MD on 11/10/2020 Stage prefix: Initial diagnosis Histologic grading system: 3 grade system   11/05/2020 Pathology Results   Diagnosis 1. Breast, left, needle core biopsy, 12 o'clock subareolar, ribbon clip - INVASIVE MAMMARY CARCINOMA. SEE NOTE 1. Carcinoma measures 1.5 cm in greatest linear dimension and appears grade  2. 1. Immunohistochemical stain for E-cadherin is positive in the tumor cells, consistent with a ductal phenotype. 1. PROGNOSTIC INDICATORS Results: The tumor cells are EQUIVOCAL for Her2 (2+). Her2 by FISH will be performed and results reported separately. Estrogen Receptor: 70%, POSITIVE, STRONG-MODERATE STAINING INTENSITY Progesterone Receptor: 80%, POSITIVE, MODERATE STAINING INTENSITY Proliferation Marker Ki67: 1%  2. Lymph node, needle/core biopsy, left axilla, tribell clip - METASTATIC CARCINOMA TO A LYMPH NODE. SEE NOTE 2. Largest contiguous focus of metastatic carcinoma measures 0.3 cm. 2. PROGNOSTIC INDICATORS Results: The tumor cells are EQUIVOCAL for Her2 (2+). Her2 by FISH will be performed and results reported separately. Estrogen Receptor: 90%, POSITIVE, STRONG STAINING INTENSITY Progesterone Receptor: 95%, POSITIVE, STRONG STAINING INTENSITY Proliferation Marker Ki67: 1%   11/10/2020 Initial Diagnosis   Malignant neoplasm of upper-outer quadrant of left breast in female, estrogen receptor positive (Pamela Reyes)   11/19/2020 Imaging   EXAM: CT CHEST, ABDOMEN, AND PELVIS WITH CONTRAST  IMPRESSION: 1. Left breast mass  as previously described. 2. Enhancing subcentimeter left axillary lymph nodes. 3. No evidence of distal metastatic disease. 4. Small hiatal hernia   11/19/2020 Imaging   EXAM: NUCLEAR MEDICINE WHOLE BODY BONE SCAN  IMPRESSION: No evidence of metastatic disease.   11/21/2020 Imaging   EXAM: BILATERAL BREAST MRI WITH AND WITHOUT CONTRAST  IMPRESSION: 1. Suspicious 1.3 centimeter mass in the anterior UPPER central RIGHT breast warranting tissue diagnosis. (Image 65 of series 7). 2. 4 millimeter possible satellite nodule posterior to the 1.3 centimeter mass in the RIGHT breast. (Image 63 of series 7). 3. Indeterminate oval mass in the UPPER central middle depth of the RIGHT breast warranting tissue diagnosis. (Image 60 of series 7). 4. Large area  of  enhancement in the central portion of the LEFT breast, involving all quadrants. There is associated enhancement and retraction of the LEFT nipple, with significantly smaller size of the LEFT breast. Enhancement spans at least 6.5 centimeters. 5. Enlarged, previously biopsied LEFT axillary lymph node.   11/24/2020 Genetic Testing   Negative hereditary cancer genetic testing: no pathogenic variants detected in [facility] BRCAPlus STAT Panel or [facility] CustomNext-Cancer +RNAinsight Panel.  The report dates are November 24, 2020 and November 27, 2020, respectively.   The BRCAplus panel offered by [employer] and includes sequencing and deletion/duplication analysis for the following 8 genes: ATM, BRCA1, BRCA2, CDH1, CHEK2, PALB2, PTEN, and TP53.  The CustomNext-Cancer+RNAinsight panel offered by [facility] includes sequencing and rearrangement analysis for the following 47 genes:  APC, ATM, AXIN2, BARD1, BMPR1A, BRCA1, BRCA2, BRIP1, CDH1, CDK4, CDKN2A, CHEK2, DICER1, EPCAM, GREM1, HOXB13, MEN1, MLH1, MSH2, MSH3, MSH6, MUTYH, NBN, NF1, NF2, NTHL1, PALB2, PMS2, POLD1, POLE, PTEN, RAD51C, RAD51D, RECQL, RET, SDHA, SDHAF2, SDHB, SDHC, SDHD, SMAD4, SMARCA4, STK11, TP53, TSC1, TSC2, and VHL.  RNA data is routinely analyzed for use in variant interpretation for all genes.    11/27/2020 Pathology Results   Diagnosis Breast, right, needle core biopsy, anterior upper central - FIBROADENOMATOID AND FIBROCYSTIC CHANGES WITH CALCIFICATIONS - PSEUDOANGIOMATOUS STROMAL HYPERPLASIA - FOCAL PERIDUCTULAR CHRONIC INFLAMMATION - NO MALIGNANCY IDENTIFIED   01/06/2021 Cancer Staging   Staging form: Breast, AJCC 8th Edition - Pathologic stage from 01/06/2021: Stage IB (pT3, pN2a, cM0, G2, ER+, PR+, HER2-) - Signed by [name], MD on 01/12/2021 Stage prefix: Initial diagnosis Histologic grading system: 3 grade system Residual tumor (R): R0 - None   01/06/2021 Definitive Surgery   FINAL MICROSCOPIC DIAGNOSIS:   A.  LYMPH NODE, LEFT AXILLARY, SENTINEL, EXCISION:  - Metastatic carcinoma to a lymph node (1/1)  - Focus of metastatic carcinoma measures 1.5 cm without evidence of extranodal extension   B. 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 BREAST, LEFT INFERIOLATERAL MARGIN, EXCISION:  - Benign fibroadipose tissue  - Negative for carcinoma   D. LYMPH NODE, LEFT AXILLARY #1, SENTINEL, EXCISION:  - Metastatic carcinoma to a lymph node (1/1)  - Focus of metastatic carcinoma measures 1.4 cm without evidence of extranodal extension   E. LYMPH NODE, LEFT AXILLARY #2, SENTINEL, EXCISION:  - Metastatic carcinoma to a lymph node (1/1)  - Focus of metastatic carcinoma measures 0.5 cm without evidence of extranodal extension   F. LYMPH NODE, LEFT AXILLARY #3, SENTINEL, EXCISION:  - Metastatic carcinoma to a lymph node (1/1)  - Focus of metastatic carcinoma measures 0.9 cm without evidence of extranodal extension   G. LYMPH NODE, LEFT AXILLARY #4, SENTINEL, EXCISION:  - Benign fibroadipose tissue, negative for carcinoma  - Lymphoid tissue is not identified    02/11/2021 - 06/24/2021 Chemotherapy   Patient is on Treatment Plan : BREAST ADJUVANT DOSE DENSE AC q14d / PACLitaxel q7d     06/16/2021 PET scan   IMPRESSION: 1. Postsurgical change of left mastectomy and left axillary lymph node dissection with tissue expander in place. Mild hypermetabolic activity about the surgical site without suspicious focal hypermetabolic soft tissue nodularity, favored to reflect postsurgical change.   2. No convincing scintigraphic evidence of hypermetabolic local recurrence or metastatic disease within the neck, chest,  abdomen or pelvis.     INTERVAL HISTORY:  Ms. Delafuente presents to the Survivorship Clinic to review her  survivorship care plan detailing her treatment course for breast cancer, as well as monitoring long-term side effects of that treatment, education regarding health maintenance, screening, and overall wellness and health promotion.     Overall, Ms. Cutrone is doing okay.  She increased Verzenio to 150 mg twice daily about 2 weeks ago.  She has had more diarrhea but intermittent, also somewhat diet related.  This past weekend she felt unwell with fatigue, nausea, and headache.  He is still trying to figure out how to manage her side effects.  On exemestane she feels much better than letrozole.  She has mild hot flashes.  Urinary symptoms have improved, she continues pelvic floor PT and has started Myrbetriq.  She has an intermittent nonspecific pain in the left lateral chest wall over her ribs.  Denies obvious injury or strain or provoking event.  Feels like "bone pain" or bruise, not burning.   ONCOLOGY TREATMENT TEAM:  1. Surgeon:  Dr. Barry Dienes at [facility] 2. Medical Oncologist: Dr. Burr Medico 3. Radiation Oncologist: Dr. Lisbeth Renshaw    PAST MEDICAL/SURGICAL HISTORY:  Past Medical History:  Diagnosis Date   Anxiety    Axillary pain, right 01/18/2021   Will get right axillary ultrasound.   Breast cancer ([place])    Breast pain, left 10/06/2020   Encounter for gynecological examination with Papanicolaou smear of cervix 02/10/2021   Encounter for screening fecal occult blood testing 02/10/2021   Family history of breast cancer 11/11/2020   Fibroid 10/27/2020   Has 1 x .9 x .6 cm subserosal fibroid   History of hiatal hernia    Left breast mass 10/06/2020   +breast cancer   LLQ pain 10/06/2020   PONV (postoperative nausea and vomiting)    Port-A-Cath in place 03/25/2021   Past Surgical History:  Procedure Laterality Date   AXILLARY LYMPH NODE DISSECTION Left 07/07/2021   Procedure: LEFT AXILLARY LYMPH NODE DISSECTION;  Surgeon: Stark Klein, MD;  Location: [place];  Service:  General;  Laterality: Left;   BREAST RECONSTRUCTION WITH PLACEMENT OF TISSUE EXPANDER AND FLEX HD (ACELLULAR HYDRATED DERMIS) Left 01/06/2021   Procedure: LEFT BREAST RECONSTRUCTION WITH PLACEMENT OF TISSUE EXPANDER AND ACELLULAR  DERMIS;  Surgeon: Irene Limbo, MD;  Location: [place];  Service: Plastics;  Laterality: Left;   MASTECTOMY W/ SENTINEL NODE BIOPSY Left 01/06/2021   Procedure: LEFT MASTECTOMY WITH MAGTRACE;  Surgeon: Stark Klein, MD;  Location: [place];  Service: General;  Laterality: Left;   PORT-A-CATH REMOVAL Right 07/07/2021   Procedure: REMOVAL PORT-A-CATH;  Surgeon: Stark Klein, MD;  Location: [place];  Service: General;  Laterality: Right;   PORTACATH PLACEMENT Right 01/19/2021   Procedure: INSERTION PORT-A-CATH;  Surgeon: Stark Klein, MD;  Location: [place];  Service: General;  Laterality: Right;   RADIOACTIVE SEED GUIDED AXILLARY SENTINEL LYMPH NODE Left 01/06/2021   Procedure: RADIOACTIVE SEED GUIDED SENTINEL LYMPH NODE BIOPSY;  Surgeon: Stark Klein, MD;  Location: [place];  Service: General;  Laterality: Left;   SENTINEL NODE BIOPSY Left 01/06/2021   Procedure: LEFT AXILLARY SENTINEL LYMPH  NODE BIOPSY;  Surgeon: Stark Klein, MD;  Location: [place];  Service: General;  Laterality: Left;   WISDOM TOOTH EXTRACTION       ALLERGIES:  Allergies  Allergen Reactions   Prochlorperazine Other (See Comments)    Severe restlessness. Akathisia.  CURRENT MEDICATIONS:  Outpatient Encounter Medications as of 12/09/2021  Medication Sig   abemaciclib (VERZENIO) 100 MG tablet Take 1 tablet (100 mg total) by mouth 2 (two) times daily.   abemaciclib (VERZENIO) 150 MG tablet Take 1 tablet (150 mg total) by mouth 2 (two) times daily.   acetaminophen (TYLENOL) 500 MG tablet Take 500-1,000 mg by mouth every 6 (six) hours as needed for moderate pain or headache.   Biotin 1 MG CAPS Take by mouth.   Budesonide ER 9 MG TB24 Take 1 tablet by mouth daily. Start on  first day of Verzenio.   Cholecalciferol (DIALYVITE VITAMIN D 5000) 125 MCG (5000 UT) capsule Take 5,000 Units by mouth daily. Vit D and K supplement combined   exemestane (AROMASIN) 25 MG tablet Take 1 tablet by mouth daily after breakfast.   famotidine (PEPCID) 10 MG tablet Take 10 mg by mouth daily as needed for heartburn or indigestion.   GOSERELIN ACETATE [place]    loratadine (CLARITIN) 10 MG tablet Take 10 mg by mouth daily.   mirabegron ER (MYRBETRIQ) 25 MG TB24 tablet Take 1 tablet (25 mg total) by mouth daily.   nitrofurantoin (MACRODANTIN) 50 MG capsule Take 1 capsule (50 mg total) by mouth as directed. Take 1 hour prior or immediately after sexual intercourse.   phenazopyridine (PYRIDIUM) 200 MG tablet Take 1 tablet (200 mg total) by mouth 3 (three) times daily as needed for pain.   tretinoin (RETIN-A) 0.025 % cream Apply 1 application. topically at bedtime.   UNABLE TO FIND Med Name: [place]/ [place] TO FIND Med Name: [place]   [DISCONTINUED] Investigational olanzapine/placebo 10 MG capsule URCC 16070 Blister Card 2 Take 1 capsule by mouth daily. Take in the morning on Days 2-4.   No facility-administered encounter medications on file as of 12/09/2021.     ONCOLOGIC FAMILY HISTORY:  Family History  Problem Relation Age of Onset   Congestive Heart Failure Father    Alcohol abuse Father    Heart disease Father    Breast cancer Sister 74       ER+   Bladder Cancer Maternal Uncle        smoking hx   Cancer Maternal Uncle        kidney or prostate; dx after 58   Breast cancer Paternal Aunt        dx after 22; x2 paternal aunts   Stroke Maternal Grandmother    Lung cancer Maternal Grandfather        dx after 50; smoking hx   Breast cancer Paternal Grandmother        dx after 66     GENETIC COUNSELING/TESTING: Yes, negative  SOCIAL HISTORY:  KHADEEJAH CASTNER lives together in [place] with her significant other of 14 years.  She does  not have children.  She denies any current or history of tobacco, alcohol, or illicit drug use.     PHYSICAL EXAMINATION:  Vital Signs:   Vitals:   12/09/21 1222  BP: 131/78  Pulse: 94  Resp: 16  Temp: 98 F (36.7 C)  SpO2: 100%   Filed Weights   12/09/21 1222  Weight: 128 lb 2 oz (58.1 kg)   General: Well-nourished, well-appearing female in no acute distress.  HEENT:   Sclerae anicteric.  Lymph: No cervical, supraclavicular, or infraclavicular lymphadenopathy noted on palpation.  Respiratory:  breathing non-labored.  GI: Abdomen soft and round; non-tender, non-distended. Bowel sounds normoactive.  Neuro:  No focal deficits. Steady gait.  Psych: Mood and affect normal and appropriate for situation.  Extremities: No edema. MSK: Mild TTP at the left low anterolateral ribs/chest wall. full range of motion in bilateral upper extremities Skin: Warm and dry. Breast exam: Right breast without nipple discharge or inversion.  S/p left mastectomy with expander reconstruction.  Chest wall with mild residual hyperpigmentation.  No lymphedema.  No palpable mass or nodularity in the right breast, left mastectomy scar or chest wall, or either axilla that I could appreciate.  LABORATORY DATA:  None for this visit.    Latest Ref Rng & Units 12/09/2021   11:17 AM 11/29/2021    8:22 AM 11/11/2021    7:54 AM  CBC  WBC 4.0 - 10.5 K/uL 2.9  2.9  2.7   Hemoglobin 12.0 - 15.0 g/dL 12.7  12.4  13.2   Hematocrit 36.0 - 46.0 % 35.7  36.4  39.2   Platelets 150 - 400 K/uL 198  206  215        Latest Ref Rng & Units 12/09/2021   11:17 AM 11/29/2021    8:22 AM 11/11/2021    7:54 AM  CMP  Glucose 70 - 99 mg/dL 79  91  97   BUN 6 - 20 mg/dL _0 Creatinine 0.44 - 1.00 mg/dL 0.86  0.83  0.78   Sodium 135 - 145 mmol/L 139  140  139   Potassium 3.5 - 5.1 mmol/L 4.7  4.3  4.4   Chloride 98 - 111 mmol/L 105  108  106   CO2 22 - 32 mmol/L _1 Calcium 8.9 - 10.3 mg/dL 9.8  9.6  9.5    Total Protein 6.5 - 8.1 g/dL 7.2  6.7  7.3   Total Bilirubin 0.3 - 1.2 mg/dL 0.4  0.3  0.4   Alkaline Phos 38 - 126 U/L 38  39  44   AST 15 - 41 U/L _2 ALT 0 - 44 U/L _3 DIAGNOSTIC IMAGING:  None for this visit.      ASSESSMENT AND PLAN:  Ms.. Whitelock is a pleasant 41 y.o. 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  She presents to the Survivorship Clinic for our initial meeting and routine follow-up post-completion of treatment for breast cancer.    1. Left breast cancer:  Ms. Gundry is continuing to recover from definitive treatment for breast cancer.  She is currently taking Verzenio 150 mg twice daily, monthly Zoladex injections, and daily exemestane.  She has more side effects at the 150 mg dose but is willing to continue for now.  If she continues to have poor tolerance we discussed trying 100 mg a.m./150 mg p.m.  She is tolerating Zoladex and exemestane better. Today, a comprehensive survivorship care plan and treatment summary was reviewed with the patient today detailing her breast cancer diagnosis, treatment course, potential late/long-term effects of treatment, appropriate follow-up care with recommendations for the future, and patient education resources.  A copy of this summary, along with a letter will be sent to the patient's primary care provider via mail/fax/In Basket message after today's visit.    2.  Left chest wall/rib pain: Unprovoked, onset a few months ago, nothing worsens or  relieves it.  Mild and intermittent dull ache.  There is some tenderness to palpation but no obvious deformity or abnormality.  Nothing on PET scan from earlier this year.  We discussed this is likely benign, possibly from surgery and or radiation.  I offered her a rib x-ray to rule out  osseous abnormality and she is interested, will get it done in the next week at her convenience.  3. Bone health:  Ms. Armenteros was premenopausal prior to treatment.  Given her current therapy with ovarian suppression starting 06/2021 it is reasonable to start DEXA screening in the next year.  In the meantime I encouraged her to start calcium (anywhere from [id number] mg as tolerated) and vitamin D and continue weightbearing exercise.    4. Cancer screening:  Due to Ms. Guerin's history and her age, she should receive screening for skin cancers and gynecologic cancers.  She agrees to colon cancer screening when age-appropriate.  She does not meet criteria for lung cancer screening.  The information and recommendations are listed on the patient's comprehensive care plan/treatment summary and were reviewed in detail with the patient.    5. Health maintenance and wellness promotion: Ms. Erby was encouraged to consume 5-7 servings of fruits and vegetables per day.  She was also encouraged to engage in moderate to vigorous exercise for 30 minutes per day most days of the week.  She was instructed to limit her alcohol consumption and continue to abstain from tobacco use.   6. Support services/counseling: It is not uncommon for this period of the patient's cancer care trajectory to be one of many emotions and stressors.  We discussed an opportunity for her to participate in the next session of [facility] ("Finding Your New Normal") support group series designed for patients after they have completed treatment.   Ms. Cobin was encouraged to take advantage of our many other support services programs, support groups, and/or counseling in coping with her new life as a cancer survivor after completing anti-cancer treatment.  She was offered support today through active listening and expressive supportive counseling.  She was given information regarding our available services and encouraged to contact me with any questions  or for help enrolling in any of our support group/programs.    Dispo:   -Continue Verzenio 150 mg twice daily for now, daily exemestane, and every 4 weeks Zoladex  -return to cancer center for monthly lab and Zoladex injection -Left rib x-ray in the next week at her convenience, I will call with results -Follow-up in 4 weeks -Screening right Mammogram due in January -Follow up with plastic surgery next week as scheduled -She is welcome to return back to the Survivorship Clinic at any time; no additional follow-up needed at this time.  -Consider referral back to survivorship as a long-term survivor for continued surveillance  Orders Placed This Encounter  Procedures   DG Ribs Unilateral W/Chest Left    Standing Status:   Future    Standing Expiration Date:   12/10/2022    Order Specific Question:   Reason for Exam (SYMPTOM  OR DIAGNOSIS REQUIRED)    Answer:   h/o left breast cancer s/p mastectomy, chemo, radiation. expander in place. 3 mo vague left rib/chest wall pain. r/o osseous abnormality    Order Specific Question:   Is patient pregnant?    Answer:   No    Order Specific Question:   Preferred imaging location?    Answer:   [facility]  A total of (40) minutes of face-to-face time was spent with this patient with greater than 50% of that time in counseling and care-coordination.   Cira Rue, NP Survivorship Program [facility] [phone]   Note: PRIMARY CARE PROVIDER Jeanie Sewer, [place] [phone]

## 2021-12-13 DIAGNOSIS — Z923 Personal history of irradiation: Secondary | ICD-10-CM | POA: Insufficient documentation

## 2021-12-14 ENCOUNTER — Other Ambulatory Visit: Payer: Self-pay | Admitting: Hematology

## 2021-12-14 ENCOUNTER — Encounter: Payer: BC Managed Care – PPO | Admitting: Nurse Practitioner

## 2021-12-15 ENCOUNTER — Ambulatory Visit (HOSPITAL_COMMUNITY)
Admission: RE | Admit: 2021-12-15 | Discharge: 2021-12-15 | Disposition: A | Discharge status: Home / Self Care | Payer: BC Managed Care – PPO | Source: Ambulatory Visit | Attending: Nurse Practitioner | Admitting: Nurse Practitioner

## 2021-12-15 DIAGNOSIS — R0789 Other chest pain: Secondary | ICD-10-CM | POA: Diagnosis present

## 2021-12-22 ENCOUNTER — Ambulatory Visit: Payer: BC Managed Care – PPO | Attending: General Surgery | Admitting: Physical Therapy

## 2021-12-22 DIAGNOSIS — R279 Unspecified lack of coordination: Secondary | ICD-10-CM | POA: Insufficient documentation

## 2021-12-22 DIAGNOSIS — M62838 Other muscle spasm: Secondary | ICD-10-CM | POA: Diagnosis not present

## 2021-12-22 NOTE — Therapy (Signed)
OUTPATIENT PHYSICAL THERAPY FEMALE PELVIC TREATMENT   Patient Name: Pamela Reyes MRN: [id number] DOB:Jul 02, 1980, 41 y.o., female Today's Date: 12/22/2021   PT End of Session - 12/22/21 0849     Visit Number 11   PF6   Date for PT Re-Evaluation 12/27/21    Authorization Type BC[employer]    PT Start Time 0845    PT Stop Time 0925    PT Time Calculation (min) 40 min    Activity Tolerance Patient tolerated treatment well    Behavior During Therapy [facility] for tasks assessed/performed               Past Medical History:  Diagnosis Date   Anxiety    Axillary pain, right 01/18/2021   Will get right axillary ultrasound.   Breast cancer ([place])    Breast pain, left 10/06/2020   Encounter for gynecological examination with Papanicolaou smear of cervix 02/10/2021   Encounter for screening fecal occult blood testing 02/10/2021   Family history of breast cancer 11/11/2020   Fibroid 10/27/2020   Has 1 x .9 x .6 cm subserosal fibroid   History of hiatal hernia    Left breast mass 10/06/2020   +breast cancer   LLQ pain 10/06/2020   PONV (postoperative nausea and vomiting)    Port-A-Cath in place 03/25/2021   Past Surgical History:  Procedure Laterality Date   AXILLARY LYMPH NODE DISSECTION Left 07/07/2021   Procedure: LEFT AXILLARY LYMPH NODE DISSECTION;  Surgeon: Stark Klein, MD;  Location: [place];  Service: General;  Laterality: Left;   BREAST RECONSTRUCTION WITH PLACEMENT OF TISSUE EXPANDER AND FLEX HD (ACELLULAR HYDRATED DERMIS) Left 01/06/2021   Procedure: LEFT BREAST RECONSTRUCTION WITH PLACEMENT OF TISSUE EXPANDER AND ACELLULAR  DERMIS;  Surgeon: Irene Limbo, MD;  Location: [place];  Service: Plastics;  Laterality: Left;   MASTECTOMY W/ SENTINEL NODE BIOPSY Left 01/06/2021   Procedure: LEFT MASTECTOMY WITH MAGTRACE;  Surgeon: Stark Klein, MD;  Location: [place];  Service: General;  Laterality: Left;   PORT-A-CATH REMOVAL Right 07/07/2021   Procedure: REMOVAL  PORT-A-CATH;  Surgeon: Stark Klein, MD;  Location: [place];  Service: General;  Laterality: Right;   PORTACATH PLACEMENT Right 01/19/2021   Procedure: INSERTION PORT-A-CATH;  Surgeon: Stark Klein, MD;  Location: [place];  Service: General;  Laterality: Right;   RADIOACTIVE SEED GUIDED AXILLARY SENTINEL LYMPH NODE Left 01/06/2021   Procedure: RADIOACTIVE SEED GUIDED SENTINEL LYMPH NODE BIOPSY;  Surgeon: Stark Klein, MD;  Location: [place];  Service: General;  Laterality: Left;   SENTINEL NODE BIOPSY Left 01/06/2021   Procedure: LEFT AXILLARY SENTINEL LYMPH  NODE BIOPSY;  Surgeon: Stark Klein, MD;  Location: [place];  Service: General;  Laterality: Left;   [place] EXTRACTION     Patient Active Problem List   Diagnosis Date Noted   Burning with urination 10/25/2021   Urinary frequency 10/25/2021   Urgency of urination 10/25/2021   Left flank pain, chronic 09/27/2021   Paresthesia 06/08/2021   Port-A-Cath in place 03/25/2021   Genetic testing 11/25/2020   Family history of breast cancer 11/11/2020   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 lymph node, left ([place]) 11/11/2020   Dense breasts 11/11/2020   Malignant neoplasm of upper-outer quadrant of left breast in female, estrogen receptor positive ([place]) 11/10/2020   Fibroid 10/27/2020    PCP: Maximiano Coss, NP  REFERRING PROVIDER: Stark Klein, MD  REFERRING DIAG: [id number] (ICD-10-CM) - Malignant neoplasm of upper-outer quadrant of left female breast  C50.912 (ICD-10-CM) - Malignant neoplasm of unspecified site of left female breast N89.8 (ICD-10-CM) - Other specified noninflammatory disorders of vagina  THERAPY DIAG:  Other muscle spasm  Unspecified lack of coordination  Rationale for Evaluation and Treatment Rehabilitation  ONSET DATE: 2-3 months ago  SUBJECTIVE:                                                                                                                                                                                            SUBJECTIVE STATEMENT: Pt reports pain has continued to be better, intercourse much more tolerable and dryness has been a little better. Pt still using vaginal dilator size 3 which is slightly smaller than partner. Now pt has 4/10 pain with penetration then 10-15s 1/10 pain with deeper penetration.   Fluid intake: Yes: 64 oz     PAIN:  Are you having pain? No   PRECAUTIONS: actively having CA radiation treatment  WEIGHT BEARING RESTRICTIONS No  FALLS:  Has patient fallen in last 6 months? No  LIVING ENVIRONMENT: Lives with: lives with their spouse Lives in: House/apartment   OCCUPATION: used book Training and development officer  PLOF: Independent  PATIENT GOALS to have less pain at vaginal opening  PERTINENT HISTORY:  underwent left mastectomy on 01/06/21, 4 cycles AC 02/11/21 - 03/25/21,  additional 12 cycles 06/24/21, left ALND on 07/07/21 with Dr. Barry Dienes and had one out of 11 nodes positive,  Sexual abuse: No  BOWEL MOVEMENT Pain with bowel movement: No Type of bowel movement:Type (Bristol Stool Scale) 1, Frequency every 2-3 days, and Strain No Fully empty rectum: Yes:   Leakage: No Pads: No Fiber supplement: No  URINATION Pain with urination: No Fully empty bladder: No Stream: Strong Urgency: No Frequency: not quicker than every 2 hours Leakage:  no Pads: No  INTERCOURSE Pain with intercourse: Initial Penetration, During Penetration, and During Climax Ability to have vaginal penetration:  Yes: but painful Climax: painful  Marinoff Scale: 2/3  PREGNANCY Vaginal deliveries 0 Tearing No C-section deliveries 0 Currently pregnant No  PROLAPSE None    OBJECTIVE:   DIAGNOSTIC FINDINGS:    COGNITION:  Overall cognitive status: Within functional limits for tasks assessed     SENSATION:  Light touch: Appears intact  Proprioception: Appears intact  MUSCLE LENGTH: Bil hamstrings and adductors limited by 25%                 POSTURE: rounded shoulders  LUMBARAROM/PROM  A/PROM A/PROM  eval  Flexion Limited by 75%  Extension WFL  Right lateral flexion WFL  Left lateral flexion WFL  Right rotation Limited by 50%  Left rotation Limited  by 50%   (Blank rows = not tested)  LOWER EXTREMITY ROM:  WFL  LOWER EXTREMITY MMT:  Hips grossly 4+/5; knees and ankles 5/5   PALPATION:   General  no TTP but mild fascial restrictions at abdomen in all quadrants                External Perineal Exam no TTP but did have mild dryness                              Internal Pelvic Floor mild TTP throughout pelvic floor at superficial layers and deep layers, mildly worse at Rt side compared to lt. Pt denied pain but reports she could feel the pressure/tension.   Patient confirms identification and approves PT to assess internal pelvic floor and treatment Yes  PELVIC MMT:   MMT eval 12/22/21   Vaginal 5/5; 9s isometrics; 8 reps 5/5, 10s, 9 reps  Internal Anal Sphincter    External Anal Sphincter    Puborectalis    Diastasis Recti    (Blank rows = not tested)        TONE: WFL  PROLAPSE: Not seen in hooklying   TODAY'S TREATMENT   12/22/21:  Pt consented to internal vaginal treatment this date and found to have improved tissue mobility in all directions, no pain during insertion or treatment today. Pt had no trigger points and denied any discomfort. Pt also demonstrated WFL strength, endurance and coordination of pelvic floor.   Pt educated on intercourse positions with handout given for improved comfort based on pt's needs to improve comfort/decreased pain with intercourse. Reviewed with pt, she denied questions.  Pt also re-educated on moisturizers, lubricant use continuation, HEP continuation, and pelvic relaxation before/during/after intercourse. Pt reported understanding denied additional questions.     PATIENT EDUCATION:  Education details: [id number] Person educated: Patient Education method:  Consulting civil engineer, Demonstration, Corporate treasurer cues, Verbal cues, and Handouts Education comprehension: verbalized understanding and returned demonstration   HOME EXERCISE PROGRAM: [id number]  ASSESSMENT:  CLINICAL IMPRESSION: Patient session focused on manual work to internal pelvic floor , pt responded well and tension released on Rt side and trigger points released. Pt reports she has been doing urge drill and notes frequently is better, started using wand and uses her dilators about once per week or every other week especially if not sexually active. But states intercourse over the past month has been better without bladder pain/no uti symptoms, and improved pain levels overall.  Pt denied additional questions. Pt would benefit from additional PT to further address deficits.     OBJECTIVE IMPAIRMENTS decreased coordination, decreased endurance, increased fascial restrictions, impaired flexibility, improper body mechanics, postural dysfunction, and pain.   ACTIVITY LIMITATIONS  intercourse  PARTICIPATION LIMITATIONS: interpersonal relationship  PERSONAL FACTORS Time since onset of injury/illness/exacerbation and 1 comorbidity: medical history  are also affecting patient's functional outcome.   REHAB POTENTIAL: Good  CLINICAL DECISION MAKING: Stable/uncomplicated  EVALUATION COMPLEXITY: Low   GOALS: Goals reviewed with patient? Yes  SHORT TERM GOALS: Target date: 09/23/2021  Pt to be I with HEP.  Baseline: Goal status: MET  2.  Pt to be I with abdominal massage and pelvic relaxation techniques for improved pain levels.  Baseline:  Goal status: MET  3.  Pt to report no more than 5/10 pain with intercourse for improved tolerance to vaginal penetration.  Baseline:  Goal status: MET   LONG TERM GOALS: Target date:  11/26/21  Pt to be I with advanced HEP.  Baseline:  Goal status: MET  2.  Pt to report no more than 2/10 pain with vaginal penetration for improved tolerance to  intercourse and medical exams.  Baseline:  Goal status: met with dilators, intermittently met with partner but not consistently.   3.  Pt to be I with moisturizer use and report improvement of vaginal dryness by at least 50% for improved QOL.  Baseline:  Goal status: MET  4. Pt to report improved time between bladder voids to at least 2.5 hours for improved bladder habits and QOL. Baseline:  Goal status: met   PLAN: PT FREQUENCY: every other week  PT DURATION:  4 sessions  PLANNED INTERVENTIONS: Therapeutic exercises, Therapeutic activity, Neuromuscular re-education, Patient/Family education, Self Care, Joint mobilization, Dry Needling, Spinal mobilization, Cryotherapy, Moist heat, Taping, Biofeedback, and Manual therapy  PLAN FOR NEXT SESSION:    PHYSICAL THERAPY DISCHARGE SUMMARY  Visits from Start of Care: 11 total but 6 for pelvic floor   Current functional level related to goals / functional outcomes: All goals met    Remaining deficits: Pt does have light pain with vaginal penetration but pt reports she feels I with ability to continue all recommendations and continue working on this now that everything is much more tolerable.    Education / Equipment: HEP   Patient agrees to discharge. Patient goals were met. Patient is being discharged due to meeting the stated rehab goals. Thank you for the referral.    Stacy Gardner, PT, DPT 11/15/239:29 AM

## 2021-12-23 ENCOUNTER — Other Ambulatory Visit: Payer: Self-pay | Admitting: Hematology

## 2021-12-27 ENCOUNTER — Ambulatory Visit: Payer: BC Managed Care – PPO | Admitting: Physical Therapy

## 2022-01-06 ENCOUNTER — Inpatient Hospital Stay: Payer: BC Managed Care – PPO

## 2022-01-06 VITALS — BP 97/61 | HR 87 | Temp 98.7°F | Resp 18

## 2022-01-06 DIAGNOSIS — Z5111 Encounter for antineoplastic chemotherapy: Secondary | ICD-10-CM | POA: Diagnosis not present

## 2022-01-06 DIAGNOSIS — Z95828 Presence of other vascular implants and grafts: Secondary | ICD-10-CM

## 2022-01-06 DIAGNOSIS — Z170 Estrogen receptor positive status [ER+]: Secondary | ICD-10-CM

## 2022-01-06 LAB — CMP (CANCER CENTER ONLY)
ALT: 17 U/L (ref 0–44)
AST: 17 U/L (ref 15–41)
Albumin: 4.8 g/dL (ref 3.5–5.0)
Alkaline Phosphatase: 42 U/L (ref 38–126)
Anion gap: 6 (ref 5–15)
BUN: 23 mg/dL — ABNORMAL HIGH (ref 6–20)
CO2: 29 mmol/L (ref 22–32)
Calcium: 10.6 mg/dL — ABNORMAL HIGH (ref 8.9–10.3)
Chloride: 104 mmol/L (ref 98–111)
Creatinine: 0.94 mg/dL (ref 0.44–1.00)
GFR, Estimated: >60 mL/min (ref >60)
Glucose, Bld: 100 mg/dL — ABNORMAL HIGH (ref 70–99)
Potassium: 4.7 mmol/L (ref 3.5–5.1)
Sodium: 139 mmol/L (ref 135–145)
Total Bilirubin: 0.5 mg/dL (ref 0.3–1.2)
Total Protein: 7.8 g/dL (ref 6.5–8.1)

## 2022-01-06 LAB — CBC WITH DIFFERENTIAL (CANCER CENTER ONLY)
Abs Immature Granulocytes: 0.02 10*3/uL (ref 0.00–0.07)
Basophils Absolute: 0.1 10*3/uL (ref 0.0–0.1)
Basophils Relative: 1 %
Eosinophils Absolute: 0.1 10*3/uL (ref 0.0–0.5)
Eosinophils Relative: 1 %
HCT: 38.5 % (ref 36.0–46.0)
Hemoglobin: 13.5 g/dL (ref 12.0–15.0)
Immature Granulocytes: 1 %
Lymphocytes Relative: 21 %
Lymphs Abs: 0.7 10*3/uL (ref 0.7–4.0)
MCH: 32.6 pg (ref 26.0–34.0)
MCHC: 35.1 g/dL (ref 30.0–36.0)
MCV: 93 fL (ref 80.0–100.0)
Monocytes Absolute: 0.2 10*3/uL (ref 0.1–1.0)
Monocytes Relative: 7 %
Neutro Abs: 2.4 10*3/uL (ref 1.7–7.7)
Neutrophils Relative %: 69 %
Platelet Count: 236 10*3/uL (ref 150–400)
RBC: 4.14 MIL/uL (ref 3.87–5.11)
RDW: 14.8 % (ref 11.5–15.5)
WBC Count: 3.5 10*3/uL — ABNORMAL LOW (ref 4.0–10.5)
nRBC: 0 % (ref 0.0–0.2)

## 2022-01-06 MED ORDER — GOSERELIN ACETATE 3.6 MG ~~LOC~~ IMPL
3.6000 mg | DRUG_IMPLANT | Freq: Once | SUBCUTANEOUS | Status: AC
  Administered 2022-01-06: 3.6 mg via SUBCUTANEOUS
  Filled 2022-01-06: qty 3.6

## 2022-01-17 ENCOUNTER — Other Ambulatory Visit: Payer: Self-pay | Admitting: *Deleted

## 2022-01-17 ENCOUNTER — Ambulatory Visit: Payer: BC Managed Care – PPO | Attending: General Surgery

## 2022-01-17 VITALS — Wt 131.0 lb

## 2022-01-17 DIAGNOSIS — I972 Postmastectomy lymphedema syndrome: Secondary | ICD-10-CM | POA: Insufficient documentation

## 2022-01-17 DIAGNOSIS — M25612 Stiffness of left shoulder, not elsewhere classified: Secondary | ICD-10-CM | POA: Insufficient documentation

## 2022-01-17 DIAGNOSIS — C50412 Malignant neoplasm of upper-outer quadrant of left female breast: Secondary | ICD-10-CM | POA: Insufficient documentation

## 2022-01-17 DIAGNOSIS — M79602 Pain in left arm: Secondary | ICD-10-CM | POA: Insufficient documentation

## 2022-01-17 DIAGNOSIS — Z170 Estrogen receptor positive status [ER+]: Secondary | ICD-10-CM | POA: Insufficient documentation

## 2022-01-17 DIAGNOSIS — Z483 Aftercare following surgery for neoplasm: Secondary | ICD-10-CM | POA: Insufficient documentation

## 2022-01-17 NOTE — Therapy (Signed)
OUTPATIENT PHYSICAL THERAPY ONCOLOGY EVALUATION  Patient Name: Pamela Reyes MRN: [id number] DOB:[birth year], 41 y.o., female Today's Date: 01/19/2022  END OF SESSION:  PT End of Session - 01/19/22 0804     Visit Number 1    Number of Visits 9    Date for PT Re-Evaluation 02/16/22    PT Start Time 0806    PT Stop Time 0849    PT Time Calculation (min) 43 min    Activity Tolerance Patient tolerated treatment well    Behavior During Therapy [facility] for tasks assessed/performed             Past Medical History:  Diagnosis Date   Anxiety    Axillary pain, right 01/18/2021   Will get right axillary ultrasound.   Breast cancer ([place])    Breast pain, left 10/06/2020   Encounter for gynecological examination with Papanicolaou smear of cervix 02/10/2021   Encounter for screening fecal occult blood testing 02/10/2021   Family history of breast cancer 11/11/2020   Fibroid 10/27/2020   Has 1 x .9 x .6 cm subserosal fibroid   History of hiatal hernia    Left breast mass 10/06/2020   +breast cancer   LLQ pain 10/06/2020   PONV (postoperative nausea and vomiting)    Port-A-Cath in place 03/25/2021   Past Surgical History:  Procedure Laterality Date   AXILLARY LYMPH NODE DISSECTION Left 07/07/2021   Procedure: LEFT AXILLARY LYMPH NODE DISSECTION;  Surgeon: Stark Klein, MD;  Location: [place];  Service: General;  Laterality: Left;   BREAST RECONSTRUCTION WITH PLACEMENT OF TISSUE EXPANDER AND FLEX HD (ACELLULAR HYDRATED DERMIS) Left 01/06/2021   Procedure: LEFT BREAST RECONSTRUCTION WITH PLACEMENT OF TISSUE EXPANDER AND ACELLULAR  DERMIS;  Surgeon: Irene Limbo, MD;  Location: [place];  Service: Plastics;  Laterality: Left;   MASTECTOMY W/ SENTINEL NODE BIOPSY Left 01/06/2021   Procedure: LEFT MASTECTOMY WITH MAGTRACE;  Surgeon: Stark Klein, MD;  Location: [place];  Service: General;  Laterality: Left;   PORT-A-CATH REMOVAL Right 07/07/2021   Procedure: REMOVAL PORT-A-CATH;   Surgeon: Stark Klein, MD;  Location: [place];  Service: General;  Laterality: Right;   PORTACATH PLACEMENT Right 01/19/2021   Procedure: INSERTION PORT-A-CATH;  Surgeon: Stark Klein, MD;  Location: [place];  Service: General;  Laterality: Right;   RADIOACTIVE SEED GUIDED AXILLARY SENTINEL LYMPH NODE Left 01/06/2021   Procedure: RADIOACTIVE SEED GUIDED SENTINEL LYMPH NODE BIOPSY;  Surgeon: Stark Klein, MD;  Location: [place];  Service: General;  Laterality: Left;   SENTINEL NODE BIOPSY Left 01/06/2021   Procedure: LEFT AXILLARY SENTINEL LYMPH  NODE BIOPSY;  Surgeon: Stark Klein, MD;  Location: [place];  Service: General;  Laterality: Left;   WISDOM TOOTH EXTRACTION     Patient Active Problem List   Diagnosis Date Noted   Burning with urination 10/25/2021   Urinary frequency 10/25/2021   Urgency of urination 10/25/2021   Left flank pain, chronic 09/27/2021   Paresthesia 06/08/2021   Port-A-Cath in place 03/25/2021   Genetic testing 11/25/2020   Family history of breast cancer 11/11/2020   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 lymph node, left ([place]) 11/11/2020   Dense breasts 11/11/2020   Malignant neoplasm of upper-outer quadrant of left breast in female, estrogen receptor positive ([place]) 11/10/2020   Fibroid 10/27/2020    PCP: Jeanie Sewer, NP  REFERRING PROVIDER: [name] MD  REFERRING DIAG: L UE cording; C50.412,Z17.0 (ICD-10-CM) - Malignant neoplasm of upper-outer quadrant of left breast  in female, estrogen receptor positive ([place])   THERAPY DIAG:  Pain in left arm  Stiffness of left shoulder, not elsewhere classified  Aftercare following surgery for neoplasm  Postmastectomy lymphedema  Malignant neoplasm of upper-outer quadrant of left breast in female, estrogen receptor positive ([place])  ONSET DATE: End of October, 2023  Rationale for Evaluation and Treatment: Rehabilitation  SUBJECTIVE:                                                                                                                                                                                            SUBJECTIVE STATEMENT: I started getting cording a couple of months ago. In the last 3 weeks it has gone down in to my wrist.  PERTINENT HISTORY:  Diagnosed 10/29/2020 and found by self exam. Biopsy revealed IDC ER, PR +, HER 2 -.  Pt underwent left mastectomy on 01/06/2021 with expander. 4+/5 LN. Chemotherapy completed ACP, Lymph node dissection on 07/07/21 with 1/11 lymph nodes positive. Completed radiation at end of August 2023. PAIN:  Are you having pain? Yes NPRS scale: 3/10 Pain location: L UE Pain orientation: Left  PAIN TYPE: tight Pain description: intermittent  Aggravating factors: physical activity  Relieving factors: nothing  PRECAUTIONS: Other: at risk of lymphedema  WEIGHT BEARING RESTRICTIONS: No  FALLS:  Has patient fallen in last 6 months? 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 and [employer] of books  LEISURE: pt does not exercise outside of work  HAND DOMINANCE: right   PRIOR LEVEL OF FUNCTION: Independent  PATIENT GOALS: to get rid of the cording and prevent it from coming back   OBJECTIVE:  COGNITION: Overall cognitive status: Within functional limits for tasks assessed   PALPATION: Palpable cording from axilla down towards forearm  OBSERVATIONS / OTHER ASSESSMENTS: fullness in L upper chest above expander and under L axilla  POSTURE: forward head, rounded shoulers  UPPER EXTREMITY AROM/PROM:  A/PROM RIGHT   eval   Shoulder extension 70  Shoulder flexion 160  Shoulder abduction 170  Shoulder internal rotation 38  Shoulder external rotation 92    (Blank rows = not tested)  A/PROM LEFT   eval  Shoulder  extension 50  Shoulder flexion 145  Shoulder abduction 159  Shoulder internal rotation 45  Shoulder external rotation 91    (Blank rows = not tested)  LYMPHEDEMA ASSESSMENTS:   SURGERY TYPE/DATE: 01/06/21 L mastectomy and SLNB  NUMBER OF  LYMPH NODES REMOVED: SLNB 4/5, then ALND 07/07/21 1/11  CHEMOTHERAPY: Completed 06/24/21  RADIATION:Completed 10/04/21  HORMONE TREATMENT: Currently taking exemestane  INFECTIONS: none     QUICK DASH SURVEY:   Katina Dung - 01/19/22 0001     Open a tight or new jar No difficulty    Do heavy household chores (wash walls, wash floors) No difficulty    Carry a shopping bag or briefcase No difficulty    Wash your back No difficulty    Use a knife to cut food No difficulty    Recreational activities in which you take some force or impact through your arm, shoulder, or hand (golf, hammering, tennis) Mild difficulty    During the past week, to what extent has your arm, shoulder or hand problem interfered with your normal social activities with family, friends, neighbors, or groups? Not at all    During the past week, to what extent has your arm, shoulder or hand problem limited your work or other regular daily activities Modererately    Arm, shoulder, or hand pain. Moderate    Tingling (pins and needles) in your arm, shoulder, or hand None    Difficulty Sleeping No difficulty    DASH Score 11.36 %               TODAY'S TREATMENT:                                                                                                                                         DATE:  01/19/22: MFR performed to LUE to cording extending from axilla down to forearm with numerous cords palpable in axilla  PATIENT EDUCATION:  Education details: axillary cording and how heavy lifting can affect this, try compression bra for swelling, nature of cording and how it gets better but then tightens back up Person educated: Patient Education method:  Explanation Education comprehension: verbalized understanding  HOME EXERCISE PROGRAM: Try wearing compression bra for chest edema  ASSESSMENT:  CLINICAL IMPRESSION: Patient is a 41 y.o. female who was seen today for physical therapy evaluation and treatment for L UE cording and L chest and lateral trunk lymphedema. Pt underwent a L mastectomy and SLNB (4/5) on 01/06/21 and then had to have an ALND 07/07/21 (1/11). She came to this clinic post op for ROM. She reports she developed cording in late October 2023 and also has been having swelling in L chest for several months. There is edema just inferior to L axilla as well. She has a compression sleeve that she sometimes wears at work but reports her arm aches when she removes it. Educated pt to bring sleeve in to next appointment to check for fit and assist her with obtaining a different sleeve. 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 She would benefit from skilled PT services to  decrease L chest and trunk lymphedema and to decrease cording to allow improved L shoulder ROM.   OBJECTIVE IMPAIRMENTS: decreased knowledge of condition, decreased knowledge of use of DME, decreased ROM, increased edema, increased fascial restrictions, impaired UE functional use, postural dysfunction, and pain.   ACTIVITY LIMITATIONS: carrying and lifting  PARTICIPATION LIMITATIONS: occupation  PERSONAL FACTORS: Time since onset of injury/illness/exacerbation are also affecting patient's functional outcome.   REHAB POTENTIAL: Good  CLINICAL DECISION MAKING: Stable/uncomplicated  EVALUATION COMPLEXITY: Low  GOALS: Goals reviewed with patient? Yes  SHORT TERM GOALS=LONG TERM GOALS Target date: 02/16/22  Pt will obtain an appropriate sleeve and glove that she is able to wear at work without discomfort.  Baseline: Goal status: INITIAL  2.  Pt will demonstrate 170 degrees of L shoulder abduction to allow her to reach  out to the side.  Baseline: 159 Goal status: INITIAL  3.  Pt will be independent in self MLD for long term management of lymphedema.  Baseline:  Goal status: INITIAL  4.  Pt will report no tightness from cording at end range of left shoulder ROM to allow improved comfort.  Baseline:  Goal status: INITIAL  5.  Pt will be independent in a home exercise program for long term stretching and strengthening.  Baseline:  Goal status: INITIAL   PLAN:  PT FREQUENCY: 2x/week  PT DURATION: 4 weeks  PLANNED INTERVENTIONS: Therapeutic exercises, Therapeutic activity, Patient/Family education, Self Care, Joint mobilization, Orthotic/Fit training, Manual lymph drainage, Compression bandaging, scar mobilization, Taping, Vasopneumatic device, Manual therapy, and Re-evaluation  PLAN FOR NEXT SESSION: how is compression bra? Instruct in breast MLD, MFR for cording in LUE, check her current sleeve and glove for fit - if circular knit try flat knit - pt has discomfort after wearing sleeve all day (she has to lift at work - its prophylactic)   Manus Gunning, PT 01/19/2022, 9:04 AM

## 2022-01-17 NOTE — Therapy (Signed)
OUTPATIENT PHYSICAL THERAPY SOZO SCREENING NOTE   Patient Name: Pamela Reyes MRN: [id number] DOB:01/29/1981, 41 y.o., female Today's Date: 01/17/2022  PCP: Jeanie Sewer, NP REFERRING PROVIDER: Stark Klein, MD   PT End of Session - 01/17/22 0850     Visit Number 11   # unchanged due to screen only   PT Start Time 0849    PT Stop Time 0854    PT Time Calculation (min) 5 min    Activity Tolerance Patient tolerated treatment well    Behavior During Therapy [facility] for tasks assessed/performed             Past Medical History:  Diagnosis Date   Anxiety    Axillary pain, right 01/18/2021   Will get right axillary ultrasound.   Breast cancer ([place])    Breast pain, left 10/06/2020   Encounter for gynecological examination with Papanicolaou smear of cervix 02/10/2021   Encounter for screening fecal occult blood testing 02/10/2021   Family history of breast cancer 11/11/2020   Fibroid 10/27/2020   Has 1 x .9 x .6 cm subserosal fibroid   History of hiatal hernia    Left breast mass 10/06/2020   +breast cancer   LLQ pain 10/06/2020   PONV (postoperative nausea and vomiting)    Port-A-Cath in place 03/25/2021   Past Surgical History:  Procedure Laterality Date   AXILLARY LYMPH NODE DISSECTION Left 07/07/2021   Procedure: LEFT AXILLARY LYMPH NODE DISSECTION;  Surgeon: Stark Klein, MD;  Location: [place];  Service: General;  Laterality: Left;   BREAST RECONSTRUCTION WITH PLACEMENT OF TISSUE EXPANDER AND FLEX HD (ACELLULAR HYDRATED DERMIS) Left 01/06/2021   Procedure: LEFT BREAST RECONSTRUCTION WITH PLACEMENT OF TISSUE EXPANDER AND ACELLULAR  DERMIS;  Surgeon: Irene Limbo, MD;  Location: [place];  Service: Plastics;  Laterality: Left;   MASTECTOMY W/ SENTINEL NODE BIOPSY Left 01/06/2021   Procedure: LEFT MASTECTOMY WITH MAGTRACE;  Surgeon: Stark Klein, MD;  Location: [place];  Service: General;  Laterality: Left;   PORT-A-CATH REMOVAL Right 07/07/2021    Procedure: REMOVAL PORT-A-CATH;  Surgeon: Stark Klein, MD;  Location: [place];  Service: General;  Laterality: Right;   PORTACATH PLACEMENT Right 01/19/2021   Procedure: INSERTION PORT-A-CATH;  Surgeon: Stark Klein, MD;  Location: [place];  Service: General;  Laterality: Right;   RADIOACTIVE SEED GUIDED AXILLARY SENTINEL LYMPH NODE Left 01/06/2021   Procedure: RADIOACTIVE SEED GUIDED SENTINEL LYMPH NODE BIOPSY;  Surgeon: Stark Klein, MD;  Location: [place];  Service: General;  Laterality: Left;   SENTINEL NODE BIOPSY Left 01/06/2021   Procedure: LEFT AXILLARY SENTINEL LYMPH  NODE BIOPSY;  Surgeon: Stark Klein, MD;  Location: [place];  Service: General;  Laterality: Left;   [place] EXTRACTION     Patient Active Problem List   Diagnosis Date Noted   Burning with urination 10/25/2021   Urinary frequency 10/25/2021   Urgency of urination 10/25/2021   Left flank pain, chronic 09/27/2021   Paresthesia 06/08/2021   Port-A-Cath in place 03/25/2021   Genetic testing 11/25/2020   Family history of breast cancer 11/11/2020   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 lymph node, left ([place]) 11/11/2020   Dense breasts 11/11/2020   Malignant neoplasm of upper-outer quadrant of left breast in female, estrogen receptor positive ([place]) 11/10/2020   Fibroid 10/27/2020    REFERRING DIAG: left breast cancer at risk for lymphedema  THERAPY DIAG:  Aftercare following surgery for neoplasm  PERTINENT HISTORY: Diagnosed 10/29/2020 and found by  self exam. Biopsy revealed IDC ER, PR +, HER 2 -.  Pt underwent left mastectomy on 01/06/2021 with expander. 4+/5 LN. Chemotherapy completed ACP, Lymph node dissection on 07/07/21 with 1/11 lymph nodes positive. Radiation simulation on 08/02/21.   PRECAUTIONS: left UE Lymphedema risk, None  SUBJECTIVE: Pt returns for her 3 month L-dex screen. "I've started noticing some increased cording down my Lt arm recently and my chest has been feeling tighter  recently since I completed radiation in Aug. I'd like to come back for PT."  PAIN:  Are you having pain? No  SOZO SCREENING: Patient was assessed today using the SOZO machine to determine the lymphedema index score. This was compared to her baseline score. It was determined that she is within the recommended range when compared to her baseline and no further action is needed at this time. She will continue SOZO screenings. These are done every 3 months for 2 years post operatively followed by every 6 months for 2 years, and then annually. Patient reported a change in status to PTA which initiated the PTA consulting with a PT. PT determined it would be appropriate to initiate therapy at this time. PT requested a referral from patient's provider.    L-DEX FLOWSHEETS - 01/17/22 0800       L-DEX LYMPHEDEMA SCREENING   Measurement Type Unilateral    L-DEX MEASUREMENT EXTREMITY Upper Extremity    POSITION  Standing    DOMINANT SIDE Right    At Risk Side Left    BASELINE SCORE (UNILATERAL) -0.2    L-DEX SCORE (UNILATERAL) 1.9    VALUE CHANGE (UNILAT) 2.1               Otelia Limes, PTA 01/17/2022, 8:52 AM

## 2022-01-19 ENCOUNTER — Encounter: Payer: Self-pay | Admitting: Physical Therapy

## 2022-01-19 ENCOUNTER — Other Ambulatory Visit: Payer: Self-pay | Admitting: Hematology

## 2022-01-19 ENCOUNTER — Other Ambulatory Visit: Payer: Self-pay

## 2022-01-19 ENCOUNTER — Ambulatory Visit: Payer: BC Managed Care – PPO | Admitting: Physical Therapy

## 2022-01-19 DIAGNOSIS — Z483 Aftercare following surgery for neoplasm: Secondary | ICD-10-CM

## 2022-01-19 DIAGNOSIS — I972 Postmastectomy lymphedema syndrome: Secondary | ICD-10-CM

## 2022-01-19 DIAGNOSIS — M25612 Stiffness of left shoulder, not elsewhere classified: Secondary | ICD-10-CM | POA: Diagnosis present

## 2022-01-19 DIAGNOSIS — M79602 Pain in left arm: Secondary | ICD-10-CM

## 2022-01-19 DIAGNOSIS — Z170 Estrogen receptor positive status [ER+]: Secondary | ICD-10-CM

## 2022-01-19 DIAGNOSIS — C50412 Malignant neoplasm of upper-outer quadrant of left female breast: Secondary | ICD-10-CM | POA: Diagnosis present

## 2022-01-20 ENCOUNTER — Encounter: Payer: Self-pay | Admitting: *Deleted

## 2022-01-21 ENCOUNTER — Ambulatory Visit: Payer: BC Managed Care – PPO | Admitting: Rehabilitation

## 2022-01-21 ENCOUNTER — Encounter: Payer: Self-pay | Admitting: Rehabilitation

## 2022-01-21 DIAGNOSIS — I972 Postmastectomy lymphedema syndrome: Secondary | ICD-10-CM

## 2022-01-21 DIAGNOSIS — Z483 Aftercare following surgery for neoplasm: Secondary | ICD-10-CM

## 2022-01-21 DIAGNOSIS — M79602 Pain in left arm: Secondary | ICD-10-CM

## 2022-01-21 DIAGNOSIS — C50412 Malignant neoplasm of upper-outer quadrant of left female breast: Secondary | ICD-10-CM

## 2022-01-21 DIAGNOSIS — M25612 Stiffness of left shoulder, not elsewhere classified: Secondary | ICD-10-CM

## 2022-01-21 NOTE — Therapy (Signed)
OUTPATIENT PHYSICAL THERAPY ONCOLOGY EVALUATION  Patient Name: Pamela Reyes MRN: [id number] DOB:[birth year], 41 y.o., female Today's Date: 01/21/2022  END OF SESSION:  PT End of Session - 01/21/22 0800     Visit Number 2    Number of Visits 9    Date for PT Re-Evaluation 02/16/22    PT Start Time 0803    PT Stop Time 6811    PT Time Calculation (min) 52 min    Activity Tolerance Patient tolerated treatment well    Behavior During Therapy [facility] for tasks assessed/performed             Past Medical History:  Diagnosis Date   Anxiety    Axillary pain, right 01/18/2021   Will get right axillary ultrasound.   Breast cancer ([place])    Breast pain, left 10/06/2020   Encounter for gynecological examination with Papanicolaou smear of cervix 02/10/2021   Encounter for screening fecal occult blood testing 02/10/2021   Family history of breast cancer 11/11/2020   Fibroid 10/27/2020   Has 1 x .9 x .6 cm subserosal fibroid   History of hiatal hernia    Left breast mass 10/06/2020   +breast cancer   LLQ pain 10/06/2020   PONV (postoperative nausea and vomiting)    Port-A-Cath in place 03/25/2021   Past Surgical History:  Procedure Laterality Date   AXILLARY LYMPH NODE DISSECTION Left 07/07/2021   Procedure: LEFT AXILLARY LYMPH NODE DISSECTION;  Surgeon: Stark Klein, MD;  Location: [place];  Service: General;  Laterality: Left;   BREAST RECONSTRUCTION WITH PLACEMENT OF TISSUE EXPANDER AND FLEX HD (ACELLULAR HYDRATED DERMIS) Left 01/06/2021   Procedure: LEFT BREAST RECONSTRUCTION WITH PLACEMENT OF TISSUE EXPANDER AND ACELLULAR  DERMIS;  Surgeon: Irene Limbo, MD;  Location: [place];  Service: Plastics;  Laterality: Left;   MASTECTOMY W/ SENTINEL NODE BIOPSY Left 01/06/2021   Procedure: LEFT MASTECTOMY WITH MAGTRACE;  Surgeon: Stark Klein, MD;  Location: [place];  Service: General;  Laterality: Left;   PORT-A-CATH REMOVAL Right 07/07/2021   Procedure: REMOVAL PORT-A-CATH;   Surgeon: Stark Klein, MD;  Location: [place];  Service: General;  Laterality: Right;   PORTACATH PLACEMENT Right 01/19/2021   Procedure: INSERTION PORT-A-CATH;  Surgeon: Stark Klein, MD;  Location: [place];  Service: General;  Laterality: Right;   RADIOACTIVE SEED GUIDED AXILLARY SENTINEL LYMPH NODE Left 01/06/2021   Procedure: RADIOACTIVE SEED GUIDED SENTINEL LYMPH NODE BIOPSY;  Surgeon: Stark Klein, MD;  Location: [place];  Service: General;  Laterality: Left;   SENTINEL NODE BIOPSY Left 01/06/2021   Procedure: LEFT AXILLARY SENTINEL LYMPH  NODE BIOPSY;  Surgeon: Stark Klein, MD;  Location: [place];  Service: General;  Laterality: Left;   WISDOM TOOTH EXTRACTION     Patient Active Problem List   Diagnosis Date Noted   Burning with urination 10/25/2021   Urinary frequency 10/25/2021   Urgency of urination 10/25/2021   Left flank pain, chronic 09/27/2021   Paresthesia 06/08/2021   Port-A-Cath in place 03/25/2021   Genetic testing 11/25/2020   Family history of breast cancer 11/11/2020   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 lymph node, left ([place]) 11/11/2020   Dense breasts 11/11/2020   Malignant neoplasm of upper-outer quadrant of left breast in female, estrogen receptor positive ([place]) 11/10/2020   Fibroid 10/27/2020    PCP: Jeanie Sewer, NP  REFERRING PROVIDER: [name] MD  REFERRING DIAG: L UE cording; C50.412,Z17.0 (ICD-10-CM) - Malignant neoplasm of upper-outer quadrant of left breast  in female, estrogen receptor positive ([place])   THERAPY DIAG:  Pain in left arm  Stiffness of left shoulder, not elsewhere classified  Aftercare following surgery for neoplasm  Postmastectomy lymphedema  Malignant neoplasm of upper-outer quadrant of left breast in female, estrogen receptor positive ([place])  ONSET DATE: End of October, 2023  Rationale for Evaluation and Treatment: Rehabilitation  SUBJECTIVE:                                                                                                                                                                                            SUBJECTIVE STATEMENT: I brought my sleeve to check it.   PERTINENT HISTORY:  Diagnosed 10/29/2020 and found by self exam. Biopsy revealed IDC ER, PR +, HER 2 -.  Pt underwent left mastectomy on 01/06/2021 with expander. 4+/5 LN. Chemotherapy completed ACP, Lymph node dissection on 07/07/21 with 1/11 lymph nodes positive. Completed radiation at end of August 2023. PAIN:  Are you having pain? Yes NPRS scale: 3/10 Pain location: L UE Pain orientation: Left  PAIN TYPE: tight Pain description: intermittent  Aggravating factors: physical activity  Relieving factors: nothing  PRECAUTIONS: Other: at risk of lymphedema  WEIGHT BEARING RESTRICTIONS: No  FALLS:  Has patient fallen in last 6 months? 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 and [employer] of books  LEISURE: pt does not exercise outside of work  HAND DOMINANCE: right   PRIOR LEVEL OF FUNCTION: Independent  PATIENT GOALS: to get rid of the cording and prevent it from coming back   OBJECTIVE:  COGNITION: Overall cognitive status: Within functional limits for tasks assessed   PALPATION: Palpable cording from axilla down towards forearm  OBSERVATIONS / OTHER ASSESSMENTS: fullness in L upper chest above expander and under L axilla  POSTURE: forward head, rounded shoulers  UPPER EXTREMITY AROM/PROM:  A/PROM RIGHT   eval   Shoulder extension 70  Shoulder flexion 160  Shoulder abduction 170  Shoulder internal rotation 38  Shoulder external rotation 92    (Blank rows = not tested)  A/PROM LEFT   eval  Shoulder extension 50  Shoulder flexion 145  Shoulder abduction 159   Shoulder internal rotation 45  Shoulder external rotation 91    (Blank rows = not tested)  LYMPHEDEMA ASSESSMENTS:   SURGERY TYPE/DATE: 01/06/21 L mastectomy and SLNB  NUMBER OF LYMPH NODES REMOVED: SLNB 4/5, then ALND 07/07/21 1/11  CHEMOTHERAPY: Completed 06/24/21  RADIATION:Completed 10/04/21  HORMONE TREATMENT: Currently taking exemestane  INFECTIONS: none   TODAY'S TREATMENT:                                                                                                                                         DATE:  01/21/22: MFR performed to LUE to cording extending from axilla down to forearm with numerous cords palpable in axilla with PROM and pinning into flexion and D2 extension.  Checked fit of current bella strong sleeve which seems to be too long and pt feels like it is too tight causing pain.  Decided to switch to a bella lite but they did not have her color choice so we switched again to a juzo soft size 68mx regular and order was sent to [employer] use insurance and pt was given a handout for self ordering of the bella incase the black comes back in stock sometime.    01/19/22: MFR performed to LUE to cording extending from axilla down to forearm with numerous cords palpable in axilla  PATIENT EDUCATION:  Education details: axillary cording and how heavy lifting can affect this, try compression bra for swelling, nature of cording and how it gets better but then tightens back up Person educated: Patient Education method: Explanation Education comprehension: verbalized understanding  HOME EXERCISE PROGRAM: Try wearing compression bra for chest edema  ASSESSMENT:  CLINICAL IMPRESSION: Pt tolerated cording work well.  2-3 cords noted in axilla and upper arm and not palpable in the lower arm but pt feels them there.  Decided to switch to less compression due to pain with the higher fabric intensity and no signs of lymphedema on SOZO.    OBJECTIVE IMPAIRMENTS:  decreased knowledge of condition, decreased knowledge of use of DME, decreased ROM, increased edema, increased fascial restrictions, impaired UE functional use, postural dysfunction, and pain.   ACTIVITY LIMITATIONS: carrying and lifting  PARTICIPATION LIMITATIONS: occupation  PERSONAL FACTORS: Time since onset of injury/illness/exacerbation are also affecting patient's functional outcome.   REHAB POTENTIAL: Good  CLINICAL DECISION MAKING: Stable/uncomplicated  EVALUATION COMPLEXITY: Low  GOALS: Goals reviewed with patient? Yes  SHORT TERM GOALS=LONG TERM GOALS Target date: 02/16/22  Pt will obtain an appropriate sleeve and glove that she is able to wear at work without discomfort.  Baseline: Goal status: INITIAL  2.  Pt will demonstrate 170 degrees of L shoulder abduction to allow her to reach out to the side.  Baseline: 159 Goal status: INITIAL  3.  Pt will be independent in self MLD for long term management of lymphedema.  Baseline:  Goal status: INITIAL  4.  Pt will report no tightness from cording at end range of left shoulder ROM to allow improved comfort.  Baseline:  Goal status: INITIAL  5.  Pt will be independent in a home exercise program for long term stretching and strengthening.  Baseline:  Goal status:  INITIAL   PLAN:  PT FREQUENCY: 2x/week  PT DURATION: 4 weeks  PLANNED INTERVENTIONS: Therapeutic exercises, Therapeutic activity, Patient/Family education, Self Care, Joint mobilization, Orthotic/Fit training, Manual lymph drainage, Compression bandaging, scar mobilization, Taping, Vasopneumatic device, Manual therapy, and Re-evaluation  PLAN FOR NEXT SESSION: how is compression bra? Instruct in breast MLD, MFR for cording in LUE, check her current sleeve and glove for fit - if circular knit try flat knit - pt has discomfort after wearing sleeve all day (she has to lift at work - its prophylactic)   Stark Bray, PT 01/21/2022, 9:21 AM

## 2022-01-24 ENCOUNTER — Ambulatory Visit: Payer: BC Managed Care – PPO

## 2022-01-24 DIAGNOSIS — C50412 Malignant neoplasm of upper-outer quadrant of left female breast: Secondary | ICD-10-CM

## 2022-01-24 DIAGNOSIS — I972 Postmastectomy lymphedema syndrome: Secondary | ICD-10-CM

## 2022-01-24 DIAGNOSIS — M25612 Stiffness of left shoulder, not elsewhere classified: Secondary | ICD-10-CM

## 2022-01-24 DIAGNOSIS — Z483 Aftercare following surgery for neoplasm: Secondary | ICD-10-CM

## 2022-01-24 DIAGNOSIS — M79602 Pain in left arm: Secondary | ICD-10-CM

## 2022-01-24 NOTE — Therapy (Signed)
OUTPATIENT PHYSICAL THERAPY ONCOLOGY EVALUATION  Patient Name: Pamela Reyes MRN: [id number] DOB:1980/08/09, 41 y.o., female Today's Date: 01/24/2022  END OF SESSION:  PT End of Session - 01/24/22 1457     Visit Number 3    Number of Visits 9    Date for PT Re-Evaluation 02/16/22    Authorization Type BCBS    PT Start Time 1500    PT Stop Time 1553    PT Time Calculation (min) 53 min    Activity Tolerance Patient tolerated treatment well    Behavior During Therapy [facility] for tasks assessed/performed             Past Medical History:  Diagnosis Date   Anxiety    Axillary pain, right 01/18/2021   Will get right axillary ultrasound.   Breast cancer ([place])    Breast pain, left 10/06/2020   Encounter for gynecological examination with Papanicolaou smear of cervix 02/10/2021   Encounter for screening fecal occult blood testing 02/10/2021   Family history of breast cancer 11/11/2020   Fibroid 10/27/2020   Has 1 x .9 x .6 cm subserosal fibroid   History of hiatal hernia    Left breast mass 10/06/2020   +breast cancer   LLQ pain 10/06/2020   PONV (postoperative nausea and vomiting)    Port-A-Cath in place 03/25/2021   Past Surgical History:  Procedure Laterality Date   AXILLARY LYMPH NODE DISSECTION Left 07/07/2021   Procedure: LEFT AXILLARY LYMPH NODE DISSECTION;  Surgeon: Stark Klein, MD;  Location: [place];  Service: General;  Laterality: Left;   BREAST RECONSTRUCTION WITH PLACEMENT OF TISSUE EXPANDER AND FLEX HD (ACELLULAR HYDRATED DERMIS) Left 01/06/2021   Procedure: LEFT BREAST RECONSTRUCTION WITH PLACEMENT OF TISSUE EXPANDER AND ACELLULAR  DERMIS;  Surgeon: Irene Limbo, MD;  Location: [place];  Service: Plastics;  Laterality: Left;   MASTECTOMY W/ SENTINEL NODE BIOPSY Left 01/06/2021   Procedure: LEFT MASTECTOMY WITH MAGTRACE;  Surgeon: Stark Klein, MD;  Location: [place];  Service: General;  Laterality: Left;   PORT-A-CATH REMOVAL Right 07/07/2021    Procedure: REMOVAL PORT-A-CATH;  Surgeon: Stark Klein, MD;  Location: [place];  Service: General;  Laterality: Right;   PORTACATH PLACEMENT Right 01/19/2021   Procedure: INSERTION PORT-A-CATH;  Surgeon: Stark Klein, MD;  Location: [place];  Service: General;  Laterality: Right;   RADIOACTIVE SEED GUIDED AXILLARY SENTINEL LYMPH NODE Left 01/06/2021   Procedure: RADIOACTIVE SEED GUIDED SENTINEL LYMPH NODE BIOPSY;  Surgeon: Stark Klein, MD;  Location: [place];  Service: General;  Laterality: Left;   SENTINEL NODE BIOPSY Left 01/06/2021   Procedure: LEFT AXILLARY SENTINEL LYMPH  NODE BIOPSY;  Surgeon: Stark Klein, MD;  Location: [place];  Service: General;  Laterality: Left;   WISDOM TOOTH EXTRACTION     Patient Active Problem List   Diagnosis Date Noted   Burning with urination 10/25/2021   Urinary frequency 10/25/2021   Urgency of urination 10/25/2021   Left flank pain, chronic 09/27/2021   Paresthesia 06/08/2021   Port-A-Cath in place 03/25/2021   Genetic testing 11/25/2020   Family history of breast cancer 11/11/2020   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 lymph node, left ([place]) 11/11/2020   Dense breasts 11/11/2020   Malignant neoplasm of upper-outer quadrant of left breast in female, estrogen receptor positive ([place]) 11/10/2020   Fibroid 10/27/2020    PCP: Jeanie Sewer, NP  REFERRING PROVIDER: [name] MD  REFERRING DIAG: L UE cording; C50.412,Z17.0 (ICD-10-CM) - Malignant neoplasm  of upper-outer quadrant of left breast in female, estrogen receptor positive ([place])   THERAPY DIAG:  Pain in left arm  Stiffness of left shoulder, not elsewhere classified  Aftercare following surgery for neoplasm  Postmastectomy lymphedema  Malignant neoplasm of upper-outer quadrant of left breast in female, estrogen receptor positive ([place])  ONSET DATE: End of October, 2023  Rationale for Evaluation and Treatment: Rehabilitation  SUBJECTIVE:                                                                                                                                                                                            SUBJECTIVE STATEMENT: I feel like my breast is really swollen. Its around my armpit and just above my breast and has been there about a month. I feel like the cording is much better since last visit.  PERTINENT HISTORY:  Diagnosed 10/29/2020 and found by self exam. Biopsy revealed IDC ER, PR +, HER 2 -.  Pt underwent left mastectomy on 01/06/2021 with expander. 4+/5 LN. Chemotherapy completed ACP, Lymph node dissection on 07/07/21 with 1/11 lymph nodes positive. Completed radiation at end of August 2023. PAIN:  Are you having pain? Yes NPRS scale: 3/10 Pain location: L UE, armpit and medial upper arm. Pain orientation: Left  PAIN TYPE: tight Pain description: intermittent  Aggravating factors: physical activity  Relieving factors: nothing  PRECAUTIONS: Other: at risk of lymphedema  WEIGHT BEARING RESTRICTIONS: No  FALLS:  Has patient fallen in last 6 months? 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 and [employer] of books  LEISURE: pt does not exercise outside of work  HAND DOMINANCE: right   PRIOR LEVEL OF FUNCTION: Independent  PATIENT GOALS: to get rid of the cording and prevent it from coming back   OBJECTIVE:  COGNITION: Overall cognitive status: Within functional limits for tasks assessed   PALPATION: Palpable cording from axilla down towards forearm  OBSERVATIONS / OTHER ASSESSMENTS: fullness in L upper chest above expander and under L axilla  POSTURE: forward head, rounded shoulers  UPPER EXTREMITY AROM/PROM:  A/PROM RIGHT   eval   Shoulder extension 70  Shoulder flexion 160  Shoulder  abduction 170  Shoulder internal rotation 38  Shoulder external rotation 92    (Blank rows = not tested)  A/PROM LEFT   eval  Shoulder extension 50  Shoulder flexion 145  Shoulder abduction 159  Shoulder internal rotation 45  Shoulder external rotation  91    (Blank rows = not tested)  LYMPHEDEMA ASSESSMENTS:   SURGERY TYPE/DATE: 01/06/21 L mastectomy and SLNB  NUMBER OF LYMPH NODES REMOVED: SLNB 4/5, then ALND 07/07/21 1/11  CHEMOTHERAPY: Completed 06/24/21  RADIATION:Completed 10/04/21  HORMONE TREATMENT: Currently taking exemestane  INFECTIONS: none   TODAY'S TREATMENT:                                                                                                                                          DATE:   01/24/22:  Supine wand flexion and scaption x 5 Soft tissue mobilization to left pectorals, UT and lats with cocoabutter MFR performed to LUE to cording extending from axilla down to forearm with numerous cords palpable in axilla with PROM and pinning into flexion and D2 . Supine LTR with arms outstretched x 5 with knees to right, single arm pectoral stretch at doorway x 3 Updated HEP    01/21/22: MFR performed to LUE to cording extending from axilla down to forearm with numerous cords palpable in axilla with PROM and pinning into flexion and D2 extension.  Checked fit of current bella strong sleeve which seems to be too long and pt feels like it is too tight causing pain.  Decided to switch to a bella lite but they did not have her color choice so we switched again to a juzo soft size 72mx regular and order was sent to [facility] [employer] use insurance and pt was given a handout for self ordering of the bella incase the black comes back in stock sometime.    01/19/22: MFR performed to LUE to cording extending from axilla down to forearm with numerous cords palpable in axilla  PATIENT EDUCATION:  Access Code: [email or web address]: [email or web address] Date:  01/24/2022 Prepared by: RCheral Almas Exercises - Supine Lower Trunk Rotation  - 2 x daily - 7 x weekly - 1 sets - 3-5 reps - 10 hold - Supine Shoulder Flexion with Dowel  - 2 x daily - 7 x weekly - 1 sets - 3-5 reps - 10 hold - Single Arm Doorway Pec Stretch at 90 Degrees Abduction  - 2 x daily - 7 x weekly - 1 sets - 3-5 reps - 10 hold Education details: axillary cording and how heavy lifting can affect this, try compression bra for swelling, nature of cording and how it gets better but then tightens back up Person educated: Patient Education method: Explanation Education comprehension: verbalized understanding  HOME EXERCISE PROGRAM: Try wearing compression bra for chest edema  ASSESSMENT:  CLINICAL IMPRESSION: Cording is still present but improved overall.. Reviewed importance of stretching and advised to perform 2x's per day. Pt was very tender and tight at axillary and clavicular border of pectorals today and may contribute to her feeling of swelling there  OBJECTIVE IMPAIRMENTS: decreased knowledge of condition, decreased knowledge of use of DME, decreased ROM, increased  edema, increased fascial restrictions, impaired UE functional use, postural dysfunction, and pain.   ACTIVITY LIMITATIONS: carrying and lifting  PARTICIPATION LIMITATIONS: occupation  PERSONAL FACTORS: Time since onset of injury/illness/exacerbation are also affecting patient's functional outcome.   REHAB POTENTIAL: Good  CLINICAL DECISION MAKING: Stable/uncomplicated  EVALUATION COMPLEXITY: Low  GOALS: Goals reviewed with patient? Yes  SHORT TERM GOALS=LONG TERM GOALS Target date: 02/16/22  Pt will obtain an appropriate sleeve and glove that she is able to wear at work without discomfort.  Baseline: Goal status: INITIAL  2.  Pt will demonstrate 170 degrees of L shoulder abduction to allow her to reach out to the side.  Baseline: 159 Goal status: INITIAL  3.  Pt will be independent in self MLD  for long term management of lymphedema.  Baseline:  Goal status: INITIAL  4.  Pt will report no tightness from cording at end range of left shoulder ROM to allow improved comfort.  Baseline:  Goal status: INITIAL  5.  Pt will be independent in a home exercise program for long term stretching and strengthening.  Baseline:  Goal status: INITIAL   PLAN:  PT FREQUENCY: 2x/week  PT DURATION: 4 weeks  PLANNED INTERVENTIONS: Therapeutic exercises, Therapeutic activity, Patient/Family education, Self Care, Joint mobilization, Orthotic/Fit training, Manual lymph drainage, Compression bandaging, scar mobilization, Taping, Vasopneumatic device, Manual therapy, and Re-evaluation  PLAN FOR NEXT SESSION: how is compression bra? Instruct in breast MLD,  STM to left pectorals,MFR for cording in LUE, check her current sleeve and glove for fit - if circular knit try flat knit - pt has discomfort after wearing sleeve all day (she has to lift at work - its prophylactic)   Claris Pong, PT 01/24/2022, 3:55 PM

## 2022-01-27 ENCOUNTER — Ambulatory Visit: Payer: BC Managed Care – PPO

## 2022-01-27 DIAGNOSIS — Z483 Aftercare following surgery for neoplasm: Secondary | ICD-10-CM | POA: Diagnosis not present

## 2022-01-27 DIAGNOSIS — M79602 Pain in left arm: Secondary | ICD-10-CM

## 2022-01-27 DIAGNOSIS — I972 Postmastectomy lymphedema syndrome: Secondary | ICD-10-CM

## 2022-01-27 DIAGNOSIS — M25612 Stiffness of left shoulder, not elsewhere classified: Secondary | ICD-10-CM

## 2022-01-27 DIAGNOSIS — C50412 Malignant neoplasm of upper-outer quadrant of left female breast: Secondary | ICD-10-CM

## 2022-01-27 NOTE — Therapy (Signed)
OUTPATIENT PHYSICAL THERAPY ONCOLOGY EVALUATION  Patient Name: Pamela Reyes MRN: [id number] DOB:May 19, 1980, 41 y.o., female Today's Date: 01/27/2022  END OF SESSION:  PT End of Session - 01/27/22 1600     Visit Number 4    Number of Visits 9    Date for PT Re-Evaluation 02/16/22    PT Start Time 1601    PT Stop Time 1650    PT Time Calculation (min) 49 min    Activity Tolerance Patient tolerated treatment well    Behavior During Therapy [facility] for tasks assessed/performed             Past Medical History:  Diagnosis Date   Anxiety    Axillary pain, right 01/18/2021   Will get right axillary ultrasound.   Breast cancer ([place])    Breast pain, left 10/06/2020   Encounter for gynecological examination with Papanicolaou smear of cervix 02/10/2021   Encounter for screening fecal occult blood testing 02/10/2021   Family history of breast cancer 11/11/2020   Fibroid 10/27/2020   Has 1 x .9 x .6 cm subserosal fibroid   History of hiatal hernia    Left breast mass 10/06/2020   +breast cancer   LLQ pain 10/06/2020   PONV (postoperative nausea and vomiting)    Port-A-Cath in place 03/25/2021   Past Surgical History:  Procedure Laterality Date   AXILLARY LYMPH NODE DISSECTION Left 07/07/2021   Procedure: LEFT AXILLARY LYMPH NODE DISSECTION;  Surgeon: Stark Klein, MD;  Location: [place];  Service: General;  Laterality: Left;   BREAST RECONSTRUCTION WITH PLACEMENT OF TISSUE EXPANDER AND FLEX HD (ACELLULAR HYDRATED DERMIS) Left 01/06/2021   Procedure: LEFT BREAST RECONSTRUCTION WITH PLACEMENT OF TISSUE EXPANDER AND ACELLULAR  DERMIS;  Surgeon: Irene Limbo, MD;  Location: [place];  Service: Plastics;  Laterality: Left;   MASTECTOMY W/ SENTINEL NODE BIOPSY Left 01/06/2021   Procedure: LEFT MASTECTOMY WITH MAGTRACE;  Surgeon: Stark Klein, MD;  Location: [place];  Service: General;  Laterality: Left;   PORT-A-CATH REMOVAL Right 07/07/2021   Procedure: REMOVAL PORT-A-CATH;   Surgeon: Stark Klein, MD;  Location: [place];  Service: General;  Laterality: Right;   PORTACATH PLACEMENT Right 01/19/2021   Procedure: INSERTION PORT-A-CATH;  Surgeon: Stark Klein, MD;  Location: [place];  Service: General;  Laterality: Right;   RADIOACTIVE SEED GUIDED AXILLARY SENTINEL LYMPH NODE Left 01/06/2021   Procedure: RADIOACTIVE SEED GUIDED SENTINEL LYMPH NODE BIOPSY;  Surgeon: Stark Klein, MD;  Location: [place];  Service: General;  Laterality: Left;   SENTINEL NODE BIOPSY Left 01/06/2021   Procedure: LEFT AXILLARY SENTINEL LYMPH  NODE BIOPSY;  Surgeon: Stark Klein, MD;  Location: [place];  Service: General;  Laterality: Left;   WISDOM TOOTH EXTRACTION     Patient Active Problem List   Diagnosis Date Noted   Burning with urination 10/25/2021   Urinary frequency 10/25/2021   Urgency of urination 10/25/2021   Left flank pain, chronic 09/27/2021   Paresthesia 06/08/2021   Port-A-Cath in place 03/25/2021   Genetic testing 11/25/2020   Family history of breast cancer 11/11/2020   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 lymph node, left ([place]) 11/11/2020   Dense breasts 11/11/2020   Malignant neoplasm of upper-outer quadrant of left breast in female, estrogen receptor positive ([place]) 11/10/2020   Fibroid 10/27/2020    PCP: Jeanie Sewer, NP  REFERRING PROVIDER: [name] MD  REFERRING DIAG: L UE cording; C50.412,Z17.0 (ICD-10-CM) - Malignant neoplasm of upper-outer quadrant of left breast  in female, estrogen receptor positive ([place])   THERAPY DIAG:  Pain in left arm  Stiffness of left shoulder, not elsewhere classified  Aftercare following surgery for neoplasm  Postmastectomy lymphedema  Malignant neoplasm of upper-outer quadrant of left breast in female, estrogen receptor positive ([place])  ONSET DATE: End of October, 2023  Rationale for Evaluation and Treatment: Rehabilitation  SUBJECTIVE:                                                                                                                                                                                            SUBJECTIVE STATEMENT: I was a little sore when I left last time and it felt good. The stretching feels good.I dont feel breast swelling as much today because I didn't work today.  PERTINENT HISTORY:  Diagnosed 10/29/2020 and found by self exam. Biopsy revealed IDC ER, PR +, HER 2 -.  Pt underwent left mastectomy on 01/06/2021 with expander. 4+/5 LN. Chemotherapy completed ACP, Lymph node dissection on 07/07/21 with 1/11 lymph nodes positive. Completed radiation at end of August 2023. PAIN:  Are you having pain? Yes NPRS scale: 1/10 today, but havent been working today Pain location: L UE, armpit and medial upper arm. Pain orientation: Left  PAIN TYPE: tight Pain description: intermittent  Aggravating factors: physical activity  Relieving factors: nothing  PRECAUTIONS: Other: at risk of lymphedema  WEIGHT BEARING RESTRICTIONS: No  FALLS:  Has patient fallen in last 6 months? 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 and [employer] of books  LEISURE: pt does not exercise outside of work  HAND DOMINANCE: right   PRIOR LEVEL OF FUNCTION: Independent  PATIENT GOALS: to get rid of the cording and prevent it from coming back   OBJECTIVE:  COGNITION: Overall cognitive status: Within functional limits for tasks assessed   PALPATION: Palpable cording from axilla down towards forearm  OBSERVATIONS / OTHER ASSESSMENTS: fullness in L upper chest above expander and under L axilla  POSTURE: forward head, rounded shoulers  UPPER EXTREMITY AROM/PROM:  A/PROM RIGHT   eval   Shoulder extension 70  Shoulder flexion 160  Shoulder abduction 170   Shoulder internal rotation 38  Shoulder external rotation 92    (Blank rows = not tested)  A/PROM LEFT   eval  Shoulder extension 50  Shoulder flexion 145  Shoulder abduction 159  Shoulder internal rotation 45  Shoulder external rotation 91    (Blank  rows = not tested)  LYMPHEDEMA ASSESSMENTS:   SURGERY TYPE/DATE: 01/06/21 L mastectomy and SLNB  NUMBER OF LYMPH NODES REMOVED: SLNB 4/5, then ALND 07/07/21 1/11  CHEMOTHERAPY: Completed 06/24/21  RADIATION:Completed 10/04/21  HORMONE TREATMENT: Currently taking exemestane  INFECTIONS: none   TODAY'S TREATMENT:                                                                                                                                          DATE:  01/27/2022 Discussed sleeve purchasing and she is opting to do it herself online at [facility] since [employer] would have to put on next year and [employer] cover. Soft tissue mobilization to left pectorals, UT and lats with cocoabutter in supine, and in SL to UT and scapular area with cocoabutter upine wand flexion and scaption x 5 MFR performed to LUE to cording extending from axilla down to forearm with numerous cords palpable in axilla with PROM and pinning into flexion and D2 . Supine LTR with arms outstretched x 5 with knees to right, single arm pectoral stretch at doorway  with arm extended and low x 3. Also showed NTS in same position  01/24/22:  Supine wand flexion and scaption x 5 Soft tissue mobilization to left pectorals, UT and lats with cocoabutter MFR performed to LUE to cording extending from axilla down to forearm with numerous cords palpable in axilla with PROM and pinning into flexion and D2 . Supine LTR with arms outstretched x 5 with knees to right, single arm pectoral stretch at doorway x 3 Updated HEP    01/21/22: MFR performed to LUE to cording extending from axilla down to forearm with numerous cords palpable in axilla with PROM and  pinning into flexion and D2 extension.  Checked fit of current bella strong sleeve which seems to be too long and pt feels like it is too tight causing pain.  Decided to switch to a bella lite but they did not have her color choice so we switched again to a juzo soft size 63mx regular and order was sent to [facility] [employer] use insurance and pt was given a handout for self ordering of the bella incase the black comes back in stock sometime.    01/19/22: MFR performed to LUE to cording extending from axilla down to forearm with numerous cords palpable in axilla  PATIENT EDUCATION:  Access Code: [email or web address]: [email or web address] Date: 01/24/2022 Prepared by: RCheral Almas Exercises - Supine Lower Trunk Rotation  - 2 x Reyes - 7 x weekly - 1 sets - 3-5 reps - 10 hold - Supine Shoulder Flexion with Dowel  - 2 x Reyes - 7 x weekly - 1 sets - 3-5 reps - 10 hold - Single Arm Doorway Pec Stretch at 90 Degrees Abduction  - 2 x Reyes - 7 x weekly - 1 sets - 3-5 reps -  10 hold Education details: axillary cording and how heavy lifting can affect this, try compression bra for swelling, nature of cording and how it gets better but then tightens back up Person educated: Patient Education method: Explanation Education comprehension: verbalized understanding  HOME EXERCISE PROGRAM: Try wearing compression bra for chest edema  ASSESSMENT:  CLINICAL IMPRESSION: No swelling visualized at chest today but pt does have tightness in pectorals and lats, serratus and scapular area. Cording is still present but with less tightness noted/  OBJECTIVE IMPAIRMENTS: decreased knowledge of condition, decreased knowledge of use of DME, decreased ROM, increased edema, increased fascial restrictions, impaired UE functional use, postural dysfunction, and pain.   ACTIVITY LIMITATIONS: carrying and lifting  PARTICIPATION LIMITATIONS: occupation  PERSONAL FACTORS: Time since onset of  injury/illness/exacerbation are also affecting patient's functional outcome.   REHAB POTENTIAL: Good  CLINICAL DECISION MAKING: Stable/uncomplicated  EVALUATION COMPLEXITY: Low  GOALS: Goals reviewed with patient? Yes  SHORT TERM GOALS=LONG TERM GOALS Target date: 02/16/22  Pt will obtain an appropriate sleeve and glove that she is able to wear at work without discomfort.  Baseline: Goal status: INITIAL  2.  Pt will demonstrate 170 degrees of L shoulder abduction to allow her to reach out to the side.  Baseline: 159 Goal status: INITIAL  3.  Pt will be independent in self MLD for long term management of lymphedema.  Baseline:  Goal status: INITIAL  4.  Pt will report no tightness from cording at end range of left shoulder ROM to allow improved comfort.  Baseline:  Goal status: INITIAL  5.  Pt will be independent in a home exercise program for long term stretching and strengthening.  Baseline:  Goal status: INITIAL   PLAN:  PT FREQUENCY: 2x/week  PT DURATION: 4 weeks  PLANNED INTERVENTIONS: Therapeutic exercises, Therapeutic activity, Patient/Family education, Self Care, Joint mobilization, Orthotic/Fit training, Manual lymph drainage, Compression bandaging, scar mobilization, Taping, Vasopneumatic device, Manual therapy, and Re-evaluation  PLAN FOR NEXT SESSION: how is compression bra? Check breast for swelling,Instruct in breast MLD,  STM to left pectorals,MFR for cording in LUE, check her current sleeve and glove for fit - if circular knit try flat knit - pt has discomfort after wearing sleeve all day (she has to lift at work - its prophylactic)   Claris Pong, PT 01/27/2022, 4:57 PM

## 2022-01-28 ENCOUNTER — Telehealth: Payer: Self-pay

## 2022-01-28 NOTE — Telephone Encounter (Signed)
Faxed Last office note and signed CMN to [place] fit for compression garment as per Provider

## 2022-01-31 NOTE — Progress Notes (Signed)
Page   [phone] [phone]   Clinic Follow up Note   Patient Care Team: Jeanie Sewer, NP as PCP - General (Family Medicine) Mauro Kaufmann, RN as Oncology Nurse Navigator Rockwell Germany, RN as Oncology Nurse Navigator Stark Klein, MD as Consulting Physician (General Surgery) [name], MD as Consulting Physician (Hematology) Kyung Rudd, MD as Consulting Physician (Radiation Oncology) Estill Dooms, NP as Nurse Practitioner (Obstetrics and Gynecology) Alla Feeling, NP as Nurse Practitioner (Nurse Practitioner) 02/03/2022  CHIEF COMPLAINT: Follow-up left breast cancer  SUMMARY OF ONCOLOGIC HISTORY: Oncology History Overview Note   Cancer Staging  Malignant neoplasm of upper-outer quadrant of left breast in female, estrogen receptor positive ([place]) Staging form: Breast, AJCC 8th Edition - Clinical stage from 11/05/2020: Stage IIA (cT2, cN1, cM0, G1, ER+, PR+, HER2: Equivocal) - Signed by [name], MD on 11/10/2020 - Pathologic stage from 01/06/2021: Stage IB (pT3, pN2a, cM0, G2, ER+, PR+, HER2-) - Signed by [name], MD on 01/12/2021     Malignant neoplasm of upper-outer quadrant of left breast in female, estrogen receptor positive ([place])  10/28/2020 Mammogram   Bilateral Diagnostic Mammogram; Left Breast Ultrasound  IMPRESSION: 1. Suspicious palpable mass/hypoechoic area in the left breast at 12 o'clock retroareolar measuring 4.5 cm.   2.  Suspicious lymph node in the left axilla.   11/05/2020 Cancer Staging   Staging form: Breast, AJCC 8th Edition - Clinical stage from 11/05/2020: Stage IIA (cT2, cN1, cM0, G1, ER+, PR+, HER2: Equivocal) - Signed by [name], MD on 11/10/2020 Stage prefix: Initial diagnosis Histologic grading system: 3 grade system   11/05/2020 Pathology Results   Diagnosis 1. Breast, left, needle core biopsy, 12 o'clock subareolar, ribbon clip - INVASIVE MAMMARY CARCINOMA. SEE NOTE 1. Carcinoma  measures 1.5 cm in greatest linear dimension and appears grade 2. 1. Immunohistochemical stain for E-cadherin is positive in the tumor cells, consistent with a ductal phenotype. 1. PROGNOSTIC INDICATORS Results: The tumor cells are EQUIVOCAL for Her2 (2+). Her2 by FISH will be performed and results reported separately. Estrogen Receptor: 70%, POSITIVE, STRONG-MODERATE STAINING INTENSITY Progesterone Receptor: 80%, POSITIVE, MODERATE STAINING INTENSITY Proliferation Marker Ki67: 1%  2. Lymph node, needle/core biopsy, left axilla, tribell clip - METASTATIC CARCINOMA TO A LYMPH NODE. SEE NOTE 2. Largest contiguous focus of metastatic carcinoma measures 0.3 cm. 2. PROGNOSTIC INDICATORS Results: The tumor cells are EQUIVOCAL for Her2 (2+). Her2 by FISH will be performed and results reported separately. Estrogen Receptor: 90%, POSITIVE, STRONG STAINING INTENSITY Progesterone Receptor: 95%, POSITIVE, STRONG STAINING INTENSITY Proliferation Marker Ki67: 1%   11/10/2020 Initial Diagnosis   Malignant neoplasm of upper-outer quadrant of left breast in female, estrogen receptor positive ([place])   11/19/2020 Imaging   EXAM: CT CHEST, ABDOMEN, AND PELVIS WITH CONTRAST  IMPRESSION: 1. Left breast mass  as previously described. 2. Enhancing subcentimeter left axillary lymph nodes. 3. No evidence of distal metastatic disease. 4. Small hiatal hernia   11/19/2020 Imaging   EXAM: NUCLEAR MEDICINE WHOLE BODY BONE SCAN  IMPRESSION: No evidence of metastatic disease.   11/21/2020 Imaging   EXAM: BILATERAL BREAST MRI WITH AND WITHOUT CONTRAST  IMPRESSION: 1. Suspicious 1.3 centimeter mass in the anterior UPPER central RIGHT breast warranting tissue diagnosis. (Image 65 of series 7). 2. 4 millimeter possible satellite nodule posterior to the 1.3 centimeter mass in the RIGHT breast. (Image 63 of series 7). 3. Indeterminate oval mass in the UPPER central middle depth of the RIGHT breast warranting  tissue  diagnosis. (Image 60 of series 7). 4. Large area of enhancement in the central portion of the LEFT breast, involving all quadrants. There is associated enhancement and retraction of the LEFT nipple, with significantly smaller size of the LEFT breast. Enhancement spans at least 6.5 centimeters. 5. Enlarged, previously biopsied LEFT axillary lymph node.   11/24/2020 Genetic Testing   Negative hereditary cancer genetic testing: no pathogenic variants detected in [facility] BRCAPlus STAT Panel or [facility] CustomNext-Cancer +RNAinsight Panel.  The report dates are November 24, 2020 and November 27, 2020, respectively.   The BRCAplus panel offered by [employer] and includes sequencing and deletion/duplication analysis for the following 8 genes: ATM, BRCA1, BRCA2, CDH1, CHEK2, PALB2, PTEN, and TP53.  The CustomNext-Cancer+RNAinsight panel offered by [facility] includes sequencing and rearrangement analysis for the following 47 genes:  APC, ATM, AXIN2, BARD1, BMPR1A, BRCA1, BRCA2, BRIP1, CDH1, CDK4, CDKN2A, CHEK2, DICER1, EPCAM, GREM1, HOXB13, MEN1, MLH1, MSH2, MSH3, MSH6, MUTYH, NBN, NF1, NF2, NTHL1, PALB2, PMS2, POLD1, POLE, PTEN, RAD51C, RAD51D, RECQL, RET, SDHA, SDHAF2, SDHB, SDHC, SDHD, SMAD4, SMARCA4, STK11, TP53, TSC1, TSC2, and VHL.  RNA data is routinely analyzed for use in variant interpretation for all genes.    11/27/2020 Pathology Results   Diagnosis Breast, right, needle core biopsy, anterior upper central - FIBROADENOMATOID AND FIBROCYSTIC CHANGES WITH CALCIFICATIONS - PSEUDOANGIOMATOUS STROMAL HYPERPLASIA - FOCAL PERIDUCTULAR CHRONIC INFLAMMATION - NO MALIGNANCY IDENTIFIED   01/06/2021 Cancer Staging   Staging form: Breast, AJCC 8th Edition - Pathologic stage from 01/06/2021: Stage IB (pT3, pN2a, cM0, G2, ER+, PR+, HER2-) - Signed by [name], MD on 01/12/2021 Stage prefix: Initial diagnosis Histologic grading system: 3 grade system Residual tumor (R): R0 - None   01/06/2021  Definitive Surgery   FINAL MICROSCOPIC DIAGNOSIS:   A. LYMPH NODE, LEFT AXILLARY, SENTINEL, EXCISION:  - Metastatic carcinoma to a lymph node (1/1)  - Focus of metastatic carcinoma measures 1.5 cm without evidence of extranodal extension   B. 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 BREAST, LEFT INFERIOLATERAL MARGIN, EXCISION:  - Benign fibroadipose tissue  - Negative for carcinoma   D. LYMPH NODE, LEFT AXILLARY #1, SENTINEL, EXCISION:  - Metastatic carcinoma to a lymph node (1/1)  - Focus of metastatic carcinoma measures 1.4 cm without evidence of extranodal extension   E. LYMPH NODE, LEFT AXILLARY #2, SENTINEL, EXCISION:  - Metastatic carcinoma to a lymph node (1/1)  - Focus of metastatic carcinoma measures 0.5 cm without evidence of extranodal extension   F. LYMPH NODE, LEFT AXILLARY #3, SENTINEL, EXCISION:  - Metastatic carcinoma to a lymph node (1/1)  - Focus of metastatic carcinoma measures 0.9 cm without evidence of extranodal extension   G. LYMPH NODE, LEFT AXILLARY #4, SENTINEL, EXCISION:  - Benign fibroadipose tissue, negative for carcinoma  - Lymphoid tissue is not identified    02/11/2021 - 06/24/2021 Chemotherapy   Patient is on Treatment Plan : BREAST ADJUVANT DOSE DENSE AC q14d / PACLitaxel q7d     06/16/2021 PET scan   IMPRESSION: 1. Postsurgical change of left mastectomy and left axillary lymph node dissection with tissue expander in place. Mild hypermetabolic activity about the surgical site without suspicious focal hypermetabolic soft tissue nodularity, favored to reflect postsurgical change.   2. No convincing scintigraphic evidence of hypermetabolic local  recurrence or metastatic disease within the neck, chest, abdomen or pelvis.     CURRENT THERAPY: Verzenio  150 mg twice daily, monthly Zoladex injections, and daily exemestane  INTERVAL HISTORY: Ms. Mckeen returns for follow-up as scheduled, last seen by me in survivorship visit 12/09/2021. She continues verzenio 150 mg BID. Was suffering from extreme fatigue but she turned a corner in the past few weeks and that is better. Mild nausea resolved when she changed famotidine to prilosec. Diarrhea is mild, manageable. For a few months she has been having sternal pain with certain movements and "pops" when she sneezes. Denies cough, dyspnea. She scheduled a PCP visit later today for this. The left side/chest wall pain has improved. She also has a separate pressure/heaviness in her chest that gets worse when anxious/stressed. Having implant exchange surgery on 1/12.    REVIEW OF SYSTEMS:   All other systems were reviewed with the patient and are negative.  MEDICAL HISTORY:  Past Medical History:  Diagnosis Date   Anxiety    Axillary pain, right 01/18/2021   Will get right axillary ultrasound.   Breast cancer ([place])    Breast pain, left 10/06/2020   Encounter for gynecological examination with Papanicolaou smear of cervix 02/10/2021   Encounter for screening fecal occult blood testing 02/10/2021   Family history of breast cancer 11/11/2020   Fibroid 10/27/2020   Has 1 x .9 x .6 cm subserosal fibroid   History of hiatal hernia    Left breast mass 10/06/2020   +breast cancer   LLQ pain 10/06/2020   PONV (postoperative nausea and vomiting)    Port-A-Cath in place 03/25/2021    SURGICAL HISTORY: Past Surgical History:  Procedure Laterality Date   AXILLARY LYMPH NODE DISSECTION Left 07/07/2021   Procedure: LEFT AXILLARY LYMPH NODE DISSECTION;  Surgeon: Stark Klein, MD;  Location: [facility];  Service: General;  Laterality: Left;   BREAST RECONSTRUCTION WITH PLACEMENT OF TISSUE EXPANDER AND FLEX HD (ACELLULAR HYDRATED DERMIS) Left 01/06/2021   Procedure: LEFT BREAST RECONSTRUCTION WITH  PLACEMENT OF TISSUE EXPANDER AND ACELLULAR  DERMIS;  Surgeon: Irene Limbo, MD;  Location: [facility];  Service: Plastics;  Laterality: Left;   MASTECTOMY W/ SENTINEL NODE BIOPSY Left 01/06/2021   Procedure: LEFT MASTECTOMY WITH MAGTRACE;  Surgeon: Stark Klein, MD;  Location: [facility];  Service: General;  Laterality: Left;   PORT-A-CATH REMOVAL Right 07/07/2021   Procedure: REMOVAL PORT-A-CATH;  Surgeon: Stark Klein, MD;  Location: [facility];  Service: General;  Laterality: Right;   PORTACATH PLACEMENT Right 01/19/2021   Procedure: INSERTION PORT-A-CATH;  Surgeon: Stark Klein, MD;  Location: [facility];  Service: General;  Laterality: Right;   RADIOACTIVE SEED GUIDED AXILLARY SENTINEL LYMPH NODE Left 01/06/2021   Procedure: RADIOACTIVE SEED GUIDED SENTINEL LYMPH NODE BIOPSY;  Surgeon: Stark Klein, MD;  Location: [facility];  Service: General;  Laterality: Left;   SENTINEL NODE BIOPSY Left 01/06/2021   Procedure: LEFT AXILLARY SENTINEL LYMPH  NODE BIOPSY;  Surgeon: Stark Klein, MD;  Location: [facility];  Service: General;  Laterality: Left;   WISDOM TOOTH EXTRACTION      I have reviewed the social history and family history with the patient and they are unchanged from previous note.  ALLERGIES:  is allergic to prochlorperazine.  MEDICATIONS:  Current Outpatient Medications  Medication Sig Dispense Refill   acetaminophen (TYLENOL) 500 MG tablet Take 500-1,000 mg by mouth every 6 (six) hours as needed for moderate pain or headache.  Biotin 1 MG CAPS Take by mouth.     Cholecalciferol (DIALYVITE VITAMIN D 5000) 125 MCG (5000 UT) capsule Take 5,000 Units by mouth daily. Vit D and K supplement combined     exemestane (AROMASIN) 25 MG tablet Take 1 tablet by mouth daily after breakfast. 90 tablet 0   GOSERELIN ACETATE [place]      loratadine (CLARITIN) 10 MG tablet Take 10 mg by mouth daily.     nitrofurantoin (MACRODANTIN) 50 MG capsule Take 1 capsule (50 mg total) by mouth as directed.  Take 1 hour prior or immediately after sexual intercourse. 20 capsule 2   omeprazole (PRILOSEC) 20 MG capsule Take 20 mg by mouth daily.     phenazopyridine (PYRIDIUM) 200 MG tablet Take 1 tablet (200 mg total) by mouth 3 (three) times daily as needed for pain. 20 tablet 2   tretinoin (RETIN-A) 0.025 % cream Apply 1 application. topically at bedtime.     UNABLE TO FIND Med Name: [place] [employer]/ [employer]     UNABLE TO FIND Med Name: [facility]     VERZENIO 150 MG tablet TAKE 1 TABLET BY MOUTH 2 TIMES A DAY 56 tablet 0   Budesonide ER 9 MG TB24 Take 1 tablet by mouth daily. Start on first day of Verzenio. 30 tablet 0   famotidine (PEPCID) 10 MG tablet Take 10 mg by mouth daily as needed for heartburn or indigestion.     mirabegron ER (MYRBETRIQ) 25 MG TB24 tablet Take 1 tablet (25 mg total) by mouth daily. (Patient not taking: Reported on 02/03/2022) 30 tablet 11   No current facility-administered medications for this visit.    PHYSICAL EXAMINATION: ECOG PERFORMANCE STATUS: 1 - Symptomatic but completely ambulatory  Vitals:   02/03/22 0916  BP: 125/62  Pulse: 80  Resp: 16  Temp: 98 F (36.7 C)  SpO2: 100%   Filed Weights   02/03/22 0916  Weight: 129 lb 9.6 oz (58.8 kg)    GENERAL:alert, no distress and comfortable SKIN: No rash EYES: sclera clear LUNGS: clear with normal breathing effort HEART: regular rate & rhythm and no murmurs and no lower extremity edema ABDOMEN:abdomen soft, non-tender and normal bowel sounds Musculoskeletal: No focal tenderness in the sternum NEURO: alert & oriented x 3 with fluent speech, no focal motor/sensory deficits Breast exam deferred  LABORATORY DATA:  I have reviewed the data as listed    Latest Ref Rng & Units 02/03/2022    8:48 AM 01/06/2022    8:05 AM 12/09/2021   11:17 AM  CBC  WBC 4.0 - 10.5 K/uL 2.1  3.5  2.9   Hemoglobin 12.0 - 15.0 g/dL 13.1  13.5  12.7   Hematocrit 36.0 - 46.0 % 36.9  38.5  35.7   Platelets  150 - 400 K/uL 203  236  198         Latest Ref Rng & Units 02/03/2022    8:48 AM 01/06/2022    8:05 AM 12/09/2021   11:17 AM  CMP  Glucose 70 - 99 mg/dL 91  100  79   BUN 6 - 20 mg/dL _0 Creatinine 0.44 - 1.00 mg/dL 0.85  0.94  0.86   Sodium 135 - 145 mmol/L 141  139  139   Potassium 3.5 - 5.1 mmol/L 4.7  4.7  4.7   Chloride 98 - 111 mmol/L 105  104  105   CO2 22 - 32 mmol/L 30  29  29   Calcium 8.9 - 10.3 mg/dL 10.1  10.6  9.8   Total Protein 6.5 - 8.1 g/dL 7.1  7.8  7.2   Total Bilirubin 0.3 - 1.2 mg/dL 0.5  0.5  0.4   Alkaline Phos 38 - 126 U/L 43  42  38   AST 15 - 41 U/L _0 ALT 0 - 44 U/L _1 RADIOGRAPHIC STUDIES: I have personally reviewed the radiological images as listed and agreed with the findings in the report. No results found.   ASSESSMENT & PLAN: 41 y.o. female       1. 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 Biopsy on 11/05/20 confirmed invasive ductal carcinoma, with metastatic involvement of one lymph node. -staging CT CAP and bone scan on 11/19/20 showed no distant metastasis -S/p left mastectomy on 01/06/21 under Dr. Barry Dienes. Pathology showed 6.5 cm invasive ductal carcinoma, grade 2, involving dermis of nipple without epidermal involvement. Margins were negative, but all 4 lymph nodes showed metastatic disease. -s/p 4 cycles AC 02/11/21 - 03/25/21 and Abraxane 04/08/21 - 06/24/21. -PET scan on 06/16/21 showed no convincing evidence of local recurrence or metastatic disease. -left ALND on 07/07/21 with Dr. Barry Dienes, path showed 1/11 positive nodes -s/p postmastectomy radiation by Dr. Lisbeth Renshaw 08/16/21 - 10/04/21. -she started Zoladex in 07/07/21 and letrozole on 07/21/21. She developed worsening joint pain and was switched to exemestane in early 10/2021. tolerating  better. -she started Verzenio 186m BID on 10/28/21, increased to 150 mg BID after the first month. Tolerating better, less fatigue and nausea resolved with switching famotidine to prilosec. -Labs reviewed, ANC 0.9. she is undergoing implant exchange on 1/12, will stop verzenio now. Continue zoladex (due today) and exemestane.  -F/up in 1 month, will likely restart verzenio if she is doing well. We discussed possible dose reduction to 150 mg AM/100 mg PM if needed   2. Sternal pain, chest pressure, anxiety -Onset few months -no ttp on exam -She has separate chest pressure which worsens with anxiety.  -EKG normal today in clinic. She has appt with PCP later today to discuss this. -She will update me   3.  Left chest wall/rib pain:  -Began ~11/2021, unprovoked - There was some tenderness to palpation but no obvious deformity or abnormality.  Nothing on PET scan from earlier this year.   -Xray was unremarkable -Improved   PLAN: -labs reviewed, ANC 0.9 -Hold verzenio for upcoming reconstruction surgery -Continue Zoladex and exemestane -EKG today, normal. F/up PCP for sternal pain -F/up in 4 weeks with next injection, will likely restart verzenio if she has recovered well   Orders Placed This Encounter  Procedures   EKG 12-Lead   All questions were answered. The patient knows to call the clinic with any problems, questions or concerns. No barriers to learning was detected. I spent 25 minutes counseling the patient face to face. The total time spent in the appointment was 40 minutes and more than 50% was on counseling and review of test results.     LAlla Feeling NP 02/03/22

## 2022-02-01 ENCOUNTER — Telehealth: Payer: Self-pay | Admitting: Family

## 2022-02-01 NOTE — Telephone Encounter (Signed)
Patient states: - She has been having chest/breast bone pain for several weeks  - Pain is described as dull ache, worsening over past week  - No other symptoms   Patient has been transferred to triage.

## 2022-02-01 NOTE — Telephone Encounter (Signed)
Patient Name: Pamela Reyes Gender: Female DOB: 1980-11-17 Age: 41 Y 3 M 20 D Return Phone Number: [phone] (Primary) Address: [place] [address] Client [facility] Client Site [facility] Contact Type Call Who Is Calling Patient / Member / Family / Caregiver Call Type Triage / Clinical Relationship To Patient Self Return Phone Number [phone] (Primary) Chief Complaint CHEST PAIN - pain, pressure, heaviness or tightness Reason for Call Symptomatic / Request for [name] states that she is experience pain and a dull ache in the center of her chest. Additional Comment Dr. Johnsie Cancel Translation No Nurse Assessment Nurse: Eugenio Hoes, RN, Jenny Reichmann Date/Time (Eastern Time): 02/01/2022 4:42:47 PM Confirm and document reason for call. If symptomatic, describe symptoms. ---Caller states that she is having chest pain that has been present for a few months now. Caller states that pain is dull and sharp pain with movement. Pain is 3/10. Does the patient have any new or worsening symptoms? ---Yes Will a triage be completed? ---Yes Related visit to physician within the last 2 weeks? ---No Does the PT have any chronic conditions? (i.e. diabetes, asthma, this includes High risk factors for pregnancy, etc.) ---No Is the patient pregnant or possibly pregnant? (Ask all females between the ages of 38-55) ---No Is this a behavioral health or substance abuse call? ---No Guidelines Guideline Title Affirmed Question Affirmed Notes Nurse Date/Time (Eastern Time) Chest Pain [1] Chest pain (or "angina") comes and goes AND [2] is happening more often (increasing in frequency) or getting Lynett Fish 02/01/2022 4:44:10 PM PLEASE NOTE: All timestamps contained within this report are represented as [place] Standard Time. CONFIDENTIALTY NOTICE: This fax  transmission is intended only for the addressee. It contains information that is legally privileged, confidential or otherwise protected from use or disclosure. If you are not the intended recipient, you are strictly prohibited from reviewing, disclosing, copying using or disseminating any of this information or taking any action in reliance on or regarding this information. If you have received this fax in error, please notify us immediately by telephone so that we can arrange for its return to [place]. Phone: [phone], Toll-Free: [phone], Fax: [phone] Page: 2 of 2 Call Id: [id number] Guidelines Guideline Title Affirmed Question Affirmed Notes Nurse Date/Time Eilene Ghazi Time) worse (increasing in severity) (Exception: Chest pains that last only a few seconds.) Disp. Time Eilene Ghazi Time) Disposition Final User 02/01/2022 4:40:56 PM Send to Urgent [place] 02/01/2022 4:47:46 PM Go to ED Now Yes Eugenio Hoes, RN, Jenny Reichmann Final Disposition 02/01/2022 4:47:46 PM Go to ED Now Yes Eugenio Hoes, RN, [place] Disagree/Comply Disagree Caller Understands Yes PreDisposition InappropriateToAsk Care Advice Given Per Guideline GO TO ED NOW: * Leave now. Drive carefully. CARE ADVICE given per Chest Pain (Adult) guideline. Comments User: Baird Cancer, RN Date/Time Eilene Ghazi Time): 02/01/2022 4:46:09 PM 92 HR User: Baird Cancer, RN Date/Time Eilene Ghazi Time): 02/01/2022 4:50:47 PM Called office backline and informed office that patient declined to be seen at ER. Referrals GO TO FACILITY REFUSED

## 2022-02-01 NOTE — Telephone Encounter (Signed)
Pamela Reyes, On call access nurse states: - After evaluation, final disposition was for pt to go to ED.  - Pt informed her she will not be doing that   I informed Pamela Reyes that pt previously scheduled herself to see PCP on 02/03/22 and that I will be informing PCP of her decision. Pamela Reyes verbalized understanding.   Spoke with pt to confirm she will be keeping OV on 12/28 @ 3:40pm w/ PCP. Also informed her that if symptoms worsen to go to ED. Pt verbalized understanding.

## 2022-02-03 ENCOUNTER — Encounter: Payer: Self-pay | Admitting: Nurse Practitioner

## 2022-02-03 ENCOUNTER — Ambulatory Visit (INDEPENDENT_AMBULATORY_CARE_PROVIDER_SITE_OTHER): Payer: BC Managed Care – PPO | Admitting: Family

## 2022-02-03 ENCOUNTER — Inpatient Hospital Stay: Payer: BC Managed Care – PPO | Attending: Hematology

## 2022-02-03 ENCOUNTER — Encounter: Payer: Self-pay | Admitting: Family

## 2022-02-03 ENCOUNTER — Inpatient Hospital Stay: Payer: BC Managed Care – PPO

## 2022-02-03 ENCOUNTER — Inpatient Hospital Stay (HOSPITAL_BASED_OUTPATIENT_CLINIC_OR_DEPARTMENT_OTHER): Payer: BC Managed Care – PPO | Admitting: Nurse Practitioner

## 2022-02-03 VITALS — BP 96/52 | HR 73 | Temp 98.7°F | Ht 64.0 in | Wt 129.0 lb

## 2022-02-03 VITALS — BP 125/62 | HR 80 | Temp 98.0°F | Resp 16 | Wt 129.6 lb

## 2022-02-03 DIAGNOSIS — C50412 Malignant neoplasm of upper-outer quadrant of left female breast: Secondary | ICD-10-CM

## 2022-02-03 DIAGNOSIS — Z170 Estrogen receptor positive status [ER+]: Secondary | ICD-10-CM | POA: Insufficient documentation

## 2022-02-03 DIAGNOSIS — Z5111 Encounter for antineoplastic chemotherapy: Secondary | ICD-10-CM | POA: Insufficient documentation

## 2022-02-03 DIAGNOSIS — Z79811 Long term (current) use of aromatase inhibitors: Secondary | ICD-10-CM | POA: Insufficient documentation

## 2022-02-03 DIAGNOSIS — R072 Precordial pain: Secondary | ICD-10-CM | POA: Diagnosis not present

## 2022-02-03 DIAGNOSIS — C773 Secondary and unspecified malignant neoplasm of axilla and upper limb lymph nodes: Secondary | ICD-10-CM | POA: Insufficient documentation

## 2022-02-03 DIAGNOSIS — F418 Other specified anxiety disorders: Secondary | ICD-10-CM

## 2022-02-03 DIAGNOSIS — Z95828 Presence of other vascular implants and grafts: Secondary | ICD-10-CM

## 2022-02-03 LAB — CBC WITH DIFFERENTIAL (CANCER CENTER ONLY)
Abs Immature Granulocytes: 0 10*3/uL (ref 0.00–0.07)
Basophils Absolute: 0.1 10*3/uL (ref 0.0–0.1)
Basophils Relative: 2 %
Eosinophils Absolute: 0 10*3/uL (ref 0.0–0.5)
Eosinophils Relative: 2 %
HCT: 36.9 % (ref 36.0–46.0)
Hemoglobin: 13.1 g/dL (ref 12.0–15.0)
Immature Granulocytes: 0 %
Lymphocytes Relative: 45 %
Lymphs Abs: 0.9 10*3/uL (ref 0.7–4.0)
MCH: 33.3 pg (ref 26.0–34.0)
MCHC: 35.5 g/dL (ref 30.0–36.0)
MCV: 93.9 fL (ref 80.0–100.0)
Monocytes Absolute: 0.2 10*3/uL (ref 0.1–1.0)
Monocytes Relative: 9 %
Neutro Abs: 0.9 10*3/uL — ABNORMAL LOW (ref 1.7–7.7)
Neutrophils Relative %: 42 %
Platelet Count: 203 10*3/uL (ref 150–400)
RBC: 3.93 MIL/uL (ref 3.87–5.11)
RDW: 13.3 % (ref 11.5–15.5)
WBC Count: 2.1 10*3/uL — ABNORMAL LOW (ref 4.0–10.5)
nRBC: 0 % (ref 0.0–0.2)

## 2022-02-03 LAB — CMP (CANCER CENTER ONLY)
ALT: 27 U/L (ref 0–44)
AST: 22 U/L (ref 15–41)
Albumin: 4.4 g/dL (ref 3.5–5.0)
Alkaline Phosphatase: 43 U/L (ref 38–126)
Anion gap: 6 (ref 5–15)
BUN: 13 mg/dL (ref 6–20)
CO2: 30 mmol/L (ref 22–32)
Calcium: 10.1 mg/dL (ref 8.9–10.3)
Chloride: 105 mmol/L (ref 98–111)
Creatinine: 0.85 mg/dL (ref 0.44–1.00)
GFR, Estimated: >60 mL/min (ref >60)
Glucose, Bld: 91 mg/dL (ref 70–99)
Potassium: 4.7 mmol/L (ref 3.5–5.1)
Sodium: 141 mmol/L (ref 135–145)
Total Bilirubin: 0.5 mg/dL (ref 0.3–1.2)
Total Protein: 7.1 g/dL (ref 6.5–8.1)

## 2022-02-03 MED ORDER — GOSERELIN ACETATE 3.6 MG ~~LOC~~ IMPL
3.6000 mg | DRUG_IMPLANT | Freq: Once | SUBCUTANEOUS | Status: AC
  Administered 2022-02-03: 3.6 mg via SUBCUTANEOUS
  Filled 2022-02-03: qty 3.6

## 2022-02-03 MED ORDER — HYDROXYZINE HCL 10 MG PO TABS: 10.0000 mg | ORAL_TABLET | Freq: Three times a day (TID) | ORAL | 0 refills | Status: DC | PRN

## 2022-02-03 NOTE — Progress Notes (Signed)
Patient ID: Pamela Reyes, female    DOB: 1980/10/17, 41 y.o.   MRN: [id number]  Chief Complaint  Patient presents with   Chest Pain    Pt stated thjat she has been feeling some pain in her stereum area for the past couple months and they seems to be getting worse.    HPI:      Substernal pain:   can feel with movement, stretching, especially in the beginning and now feels even with rest. Reports she has been doing some new stretches. Occasionally will feel a sharp pain, that doesn't last and sometimes it lasts for an hour as a dull ache, does not radiate. Denies any burning sensation, no heartburn, no epigastric pain, bloating or sore throat. Pain occurs randomly not r/t eating.  Last chest imaging back in May and negative for any nodules. Pt reports having more stress than usual d/t recent husband's surgery and she is having upcoming plastic surgery. Denies any tachycardia, palpitations, sweating, SOB.  Assessment & Plan:  1. Substernal pain - no cardiac risk factors, based on description very unlikely cardiac or reflux related. Most likely manifestation of new anxiety. Starting trial of Hydroxyzine prn, advised on use & SE, recommend taking at least bid for 1 week to see if pain subsides any.   - hydrOXYzine (ATARAX) 10 MG tablet; Take 1 tablet (10 mg total) by mouth 3 (three) times daily as needed for anxiety. START with 1 pill bid for 1 week. If no change in chest tightness stop and call the office.  Dispense: 30 tablet; Refill: 0   Subjective:    Outpatient Medications Prior to Visit  Medication Sig Dispense Refill   acetaminophen (TYLENOL) 500 MG tablet Take 500-1,000 mg by mouth every 6 (six) hours as needed for moderate pain or headache.     Biotin 1 MG CAPS Take by mouth.     Cholecalciferol (DIALYVITE VITAMIN D 5000) 125 MCG (5000 UT) capsule Take 5,000 Units by mouth daily. Vit D and K supplement combined     exemestane (AROMASIN) 25 MG tablet Take 1 tablet by mouth daily after  breakfast. 90 tablet 0   GOSERELIN ACETATE       loratadine (CLARITIN) 10 MG tablet Take 10 mg by mouth daily.     nitrofurantoin (MACRODANTIN) 50 MG capsule Take 1 capsule (50 mg total) by mouth as directed. Take 1 hour prior or immediately after sexual intercourse. 20 capsule 2   omeprazole (PRILOSEC) 20 MG capsule Take 20 mg by mouth daily.     phenazopyridine (PYRIDIUM) 200 MG tablet Take 1 tablet (200 mg total) by mouth 3 (three) times daily as needed for pain. 20 tablet 2   tretinoin (RETIN-A) 0.025 % cream Apply 1 application. topically at bedtime.     UNABLE TO FIND Med Name: Pamela Reyes/ Pamela Reyes TO FIND Med Name: Pamela Reyes     VERZENIO 150 MG tablet TAKE 1 TABLET BY MOUTH 2 TIMES A DAY 56 tablet 0   No facility-administered medications prior to visit.   Past Medical History:  Diagnosis Date   Anxiety    Axillary pain, right 01/18/2021   Will get right axillary ultrasound.   Breast cancer ([place])    Breast pain, left 10/06/2020   Encounter for gynecological examination with Papanicolaou smear of cervix 02/10/2021   Encounter for screening fecal occult blood testing 02/10/2021   Family history of breast cancer 11/11/2020   Fibroid 10/27/2020  Has 1 x .9 x .6 cm subserosal fibroid   History of hiatal hernia    Left breast mass 10/06/2020   +breast cancer   LLQ pain 10/06/2020   PONV (postoperative nausea and vomiting)    Port-A-Cath in place 03/25/2021   Past Surgical History:  Procedure Laterality Date   AXILLARY LYMPH NODE DISSECTION Left 07/07/2021   Procedure: LEFT AXILLARY LYMPH NODE DISSECTION;  Surgeon: Stark Klein, MD;  Location: [place];  Service: General;  Laterality: Left;   BREAST RECONSTRUCTION WITH PLACEMENT OF TISSUE EXPANDER AND FLEX HD (ACELLULAR HYDRATED DERMIS) Left 01/06/2021   Procedure: LEFT BREAST RECONSTRUCTION WITH PLACEMENT OF TISSUE EXPANDER AND ACELLULAR  DERMIS;  Surgeon: Irene Limbo, MD;   Location: [place];  Service: Plastics;  Laterality: Left;   MASTECTOMY W/ SENTINEL NODE BIOPSY Left 01/06/2021   Procedure: LEFT MASTECTOMY WITH MAGTRACE;  Surgeon: Stark Klein, MD;  Location: [place];  Service: General;  Laterality: Left;   PORT-A-CATH REMOVAL Right 07/07/2021   Procedure: REMOVAL PORT-A-CATH;  Surgeon: Stark Klein, MD;  Location: [place];  Service: General;  Laterality: Right;   PORTACATH PLACEMENT Right 01/19/2021   Procedure: INSERTION PORT-A-CATH;  Surgeon: Stark Klein, MD;  Location: [place];  Service: General;  Laterality: Right;   RADIOACTIVE SEED GUIDED AXILLARY SENTINEL LYMPH NODE Left 01/06/2021   Procedure: RADIOACTIVE SEED GUIDED SENTINEL LYMPH NODE BIOPSY;  Surgeon: Stark Klein, MD;  Location: [place];  Service: General;  Laterality: Left;   SENTINEL NODE BIOPSY Left 01/06/2021   Procedure: LEFT AXILLARY SENTINEL LYMPH  NODE BIOPSY;  Surgeon: Stark Klein, MD;  Location: [place];  Service: General;  Laterality: Left;   WISDOM TOOTH EXTRACTION     Allergies  Allergen Reactions   Prochlorperazine Other (See Comments)    Severe restlessness. Akathisia.      Objective:    Physical Exam Vitals and nursing note reviewed.  Constitutional:      Appearance: Normal appearance.  Cardiovascular:     Rate and Rhythm: Normal rate and regular rhythm.  Pulmonary:     Effort: Pulmonary effort is normal.     Breath sounds: Normal breath sounds.  Chest:     Chest wall: No mass, deformity, swelling or tenderness.  Musculoskeletal:        General: Normal range of motion.  Skin:    General: Skin is warm and dry.  Neurological:     Mental Status: She is alert.  Psychiatric:        Mood and Affect: Mood normal.        Behavior: Behavior normal.    BP (!) 96/52   Pulse 73   Temp 98.7 F (37.1 C)   Ht '5\' 4"'$  (1.626 m)   Wt 129 lb (58.5 kg)   SpO2 94%   BMI 22.14 kg/m  Wt Readings from Last 3 Encounters:  02/03/22 129 lb (58.5 kg)  02/03/22 129  lb 9.6 oz (58.8 kg)  01/17/22 131 lb (59.4 kg)     Jeanie Sewer, NP

## 2022-02-03 NOTE — H&P (Signed)
Subjective:     Patient ID: Pamela Reyes is a 41 y.o. female.   Follow-up       13 mo post op left mastectomy with immediate expander based reconstruction.    Presented with palpable left breast mass. In 2017 chart review notes MR guided biopsy of mass as fibroadenoma while living in [place]. Patient reported growth in mass. Diagnostic MMG/US 10/2020 showed left breast mass at 12 o'clock retroareolar measuring 4.5 cm. One enlarged LN noted in the left axilla. Biopsy demonstrated IDC ER/PR+, Her2-. LN positive for metastatic disease.   Breast MRI showed RIGHT breast 1.3 cm mass , 4 mmpossible satellite nodule posterior to this mass. Additional ineterminate oval mass in the RIGHT upper central breast noted. Left breast enhancement spans 6.0 x 5.0 x 5.5 cm, all quadrants. Enlarged, previously biopsied left axillary LN. Subsequent MR guided biopsy labeled right anterior upper central benign.   Staging scans no evidence distant metastatic disease.    Final pathology left mastectomy 6.5 cm IDC involving dermis of nipple, margins clear, 4/4 SLN+. Completed adjuvant chemotherapy 5.18.23. Completed left ALND 5.31.2023 with final pathology 1/11 LN+.    PET scan 5.10.23 no disease noted. Breast MRI 6 months follow-up of RIGHT NME recommended- this would have been June 2023, however not able to complete due to presence expander. Completed adjuvant RT 8.28.23. On exemestane.   Genetics negative. Sister, PA, PGM with breast ca. States sister had RT for treatment of her cancer.   Prior 34 C. 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   Review of Systems      Objective:   Physical Exam Cardiovascular:     Rate and Rhythm: Normal rate and regular rhythm.     Heart sounds: Normal heart sounds.  Pulmonary:     Effort: Pulmonary effort is normal.     Breath sounds: Normal breath sounds.     Chest:   Left chest expanded hyperpigmentation of RT  soft No rippling Right grade 1 ptosis no masses SN to nipple R 23 cm BW R 17 cm Nipple to IMF R 6 cm      Assessment:     Left breast ca UOQ ER+ metastasized to LN S/p left mastectomy SLN, prepectoral TE/ADM (Alloderm) reconstruction  Adjuvant chemotherapy S/p ALND Adjuvant radiation    Plan:     Screening MMG right scheduled 1.3.2024. Had labs drawn today and ANC 0.9. Instructed to hold Verzenio per Oncology. Plan reccheck prior to surgery. She understands we may need to delay surgery if remains this low.    Reviewed timing of next surgery in general 3-6 months post completion RT. Reviewed at that time options for implant exchange alone or LD flap with implant or consultation with microsurgeon for other flap options. Reviewed increased risks in setting RT including wound healing problems and CC. In setting implant based reconstruction wound healing problems may manifest as exposure or extrusion implant. Reviewed asymmetry one can expect with unilateral implant reconstruction especially in setting RT. If she was candidate for autologous alone this would address RT concerns and may offer more overall symmetry with right breast. Patient has elected for implant exchange alone. Reviewed any complications from surgery may require flap surgery in future such as latissimus to continue reconstruction efforts.   Consider symmetry procedure right breast. However this may alter imaging appearance breast and may further delay f/u MRI recommended of this breast. Patient declines any procedure over right at  this time. She understands she will have significant asymmetry shape of breasts.   Discussed saline vs silicone. Reviewed latter may have less rippling offer more stability shape however imaging surveillance needed for implant rupture. Saline has risks more visible rippling. Reviewed shared risks rupture contracture BIA-ALCL or newer reports SCC. Reviewed implants not permanent devices and may require  additional surgery. Patient has elected for saline. Completed Natrelle Saline physician patient checklist.    Additional risks including but not limited to bleeding hematoma seroma infection need for additional surgery damage to adjacent structures blood clots in legs or lungs, unacceptable cosmetic appearance reviewed. Reviewed OP surgery no drains anticipated.   Rx for bactrim given. Patient has other medication at home.   Natrelle 133S FV-11-T 300 ml tissue expander placed,  225 ml fill volume

## 2022-02-04 ENCOUNTER — Ambulatory Visit: Payer: BC Managed Care – PPO | Admitting: Physical Therapy

## 2022-02-09 ENCOUNTER — Ambulatory Visit
Admission: RE | Admit: 2022-02-09 | Discharge: 2022-02-09 | Disposition: A | Discharge status: Home / Self Care | Payer: BC Managed Care – PPO | Source: Ambulatory Visit | Attending: Nurse Practitioner | Admitting: Nurse Practitioner

## 2022-02-09 ENCOUNTER — Ambulatory Visit: Payer: BC Managed Care – PPO

## 2022-02-09 DIAGNOSIS — Z170 Estrogen receptor positive status [ER+]: Secondary | ICD-10-CM

## 2022-02-09 HISTORY — DX: Acquired absence of left breast and nipple: Z90.12

## 2022-02-10 ENCOUNTER — Other Ambulatory Visit (HOSPITAL_COMMUNITY): Payer: Self-pay | Admitting: General Surgery

## 2022-02-10 DIAGNOSIS — Z86711 Personal history of pulmonary embolism: Secondary | ICD-10-CM

## 2022-02-10 DIAGNOSIS — R0789 Other chest pain: Secondary | ICD-10-CM

## 2022-02-11 ENCOUNTER — Ambulatory Visit: Payer: BC Managed Care – PPO | Attending: Hematology

## 2022-02-11 DIAGNOSIS — M79602 Pain in left arm: Secondary | ICD-10-CM | POA: Diagnosis not present

## 2022-02-11 DIAGNOSIS — C50412 Malignant neoplasm of upper-outer quadrant of left female breast: Secondary | ICD-10-CM | POA: Diagnosis present

## 2022-02-11 DIAGNOSIS — I972 Postmastectomy lymphedema syndrome: Secondary | ICD-10-CM | POA: Insufficient documentation

## 2022-02-11 DIAGNOSIS — M25612 Stiffness of left shoulder, not elsewhere classified: Secondary | ICD-10-CM | POA: Insufficient documentation

## 2022-02-11 DIAGNOSIS — Z170 Estrogen receptor positive status [ER+]: Secondary | ICD-10-CM | POA: Diagnosis present

## 2022-02-11 DIAGNOSIS — Z9012 Acquired absence of left breast and nipple: Secondary | ICD-10-CM | POA: Diagnosis present

## 2022-02-11 DIAGNOSIS — Z483 Aftercare following surgery for neoplasm: Secondary | ICD-10-CM | POA: Diagnosis present

## 2022-02-11 NOTE — Therapy (Signed)
OUTPATIENT PHYSICAL THERAPY ONCOLOGY EVALUATION  Patient Name: Pamela Reyes MRN: [id number] DOB:12-Oct-1980, 42 y.o., female Today's Date: 02/11/2022  END OF SESSION:  PT End of Session - 02/11/22 0759     Visit Number 5    Number of Visits 9    Date for PT Re-Evaluation 02/16/22    Authorization Type BCBS    PT Start Time 0800    PT Stop Time 0850    PT Time Calculation (min) 50 min    Activity Tolerance Patient tolerated treatment well    Behavior During Therapy [facility] for tasks assessed/performed             Past Medical History:  Diagnosis Date   Anxiety    Axillary pain, right 01/18/2021   Will get right axillary ultrasound.   Breast cancer ([place])    Breast pain, left 10/06/2020   Encounter for gynecological examination with Papanicolaou smear of cervix 02/10/2021   Encounter for screening fecal occult blood testing 02/10/2021   Family history of breast cancer 11/11/2020   Fibroid 10/27/2020   Has 1 x .9 x .6 cm subserosal fibroid   H/O left mastectomy    left mastectomy 2022   History of hiatal hernia    Left breast mass 10/06/2020   +breast cancer   LLQ pain 10/06/2020   PONV (postoperative nausea and vomiting)    Port-A-Cath in place 03/25/2021   Past Surgical History:  Procedure Laterality Date   AXILLARY LYMPH NODE DISSECTION Left 07/07/2021   Procedure: LEFT AXILLARY LYMPH NODE DISSECTION;  Surgeon: Stark Klein, MD;  Location: [place];  Service: General;  Laterality: Left;   BREAST RECONSTRUCTION WITH PLACEMENT OF TISSUE EXPANDER AND FLEX HD (ACELLULAR HYDRATED DERMIS) Left 01/06/2021   Procedure: LEFT BREAST RECONSTRUCTION WITH PLACEMENT OF TISSUE EXPANDER AND ACELLULAR  DERMIS;  Surgeon: Irene Limbo, MD;  Location: [place];  Service: Plastics;  Laterality: Left;   MASTECTOMY W/ SENTINEL NODE BIOPSY Left 01/06/2021   Procedure: LEFT MASTECTOMY WITH MAGTRACE;  Surgeon: Stark Klein, MD;  Location: [place];  Service: General;   Laterality: Left;   PORT-A-CATH REMOVAL Right 07/07/2021   Procedure: REMOVAL PORT-A-CATH;  Surgeon: Stark Klein, MD;  Location: [place];  Service: General;  Laterality: Right;   PORTACATH PLACEMENT Right 01/19/2021   Procedure: INSERTION PORT-A-CATH;  Surgeon: Stark Klein, MD;  Location: [place];  Service: General;  Laterality: Right;   RADIOACTIVE SEED GUIDED AXILLARY SENTINEL LYMPH NODE Left 01/06/2021   Procedure: RADIOACTIVE SEED GUIDED SENTINEL LYMPH NODE BIOPSY;  Surgeon: Stark Klein, MD;  Location: [place];  Service: General;  Laterality: Left;   SENTINEL NODE BIOPSY Left 01/06/2021   Procedure: LEFT AXILLARY SENTINEL LYMPH  NODE BIOPSY;  Surgeon: Stark Klein, MD;  Location: [place];  Service: General;  Laterality: Left;   [place] EXTRACTION     Patient Active Problem List   Diagnosis Date Noted   Burning with urination 10/25/2021   Urinary frequency 10/25/2021   Urgency of urination 10/25/2021   Left flank pain, chronic 09/27/2021   Paresthesia 06/08/2021   Port-A-Cath in place 03/25/2021   Genetic testing 11/25/2020   Family history of breast cancer 11/11/2020   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 lymph node, left ([place]) 11/11/2020   Dense breasts 11/11/2020   Malignant neoplasm of upper-outer quadrant of left breast in female, estrogen receptor positive ([place]) 11/10/2020   Fibroid 10/27/2020    PCP: Jeanie Sewer, NP  REFERRING PROVIDER: [name] MD  REFERRING DIAG: L UE cording; C50.412,Z17.0 (ICD-10-CM) - Malignant neoplasm of upper-outer quadrant of left breast in female, estrogen receptor positive ([place])   THERAPY DIAG:  Pain in left arm  Stiffness of left shoulder, not elsewhere classified  Aftercare following surgery for neoplasm  Postmastectomy lymphedema  Malignant neoplasm of upper-outer quadrant of left breast in female, estrogen receptor positive ([place])  ONSET DATE: End of October, 2023  Rationale for Evaluation and  Treatment: Rehabilitation  SUBJECTIVE:                                                                                                                                                                                           SUBJECTIVE STATEMENT: My left arm is improving. Mobility is better. Breast swelling is about the same. Its not bad. I am going to get another compression bra.  PERTINENT HISTORY:  Diagnosed 10/29/2020 and found by self exam. Biopsy revealed IDC ER, PR +, HER 2 -.  Pt underwent left mastectomy on 01/06/2021 with expander. 4+/5 LN. Chemotherapy completed ACP, Lymph node dissection on 07/07/21 with 1/11 lymph nodes positive. Completed radiation at end of August 2023. PAIN:  Are you having pain? Yes NPRS scale: 2/10 today, but havent been working today Pain location: L UE, armpit and medial upper arm. Pain orientation: Left  PAIN TYPE: tight Pain description: intermittent  Aggravating factors: physical activity  Relieving factors: nothing  PRECAUTIONS: Other: at risk of lymphedema  WEIGHT BEARING RESTRICTIONS: No  FALLS:  Has patient fallen in last 6 months? 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 and [employer] of books  LEISURE: pt does not exercise outside of work  HAND DOMINANCE: right   PRIOR LEVEL OF FUNCTION: Independent  PATIENT GOALS: to get rid of the cording and prevent it from coming back   OBJECTIVE:  COGNITION: Overall cognitive status: Within functional limits for tasks assessed   PALPATION: Palpable cording from axilla down towards forearm  OBSERVATIONS / OTHER ASSESSMENTS: fullness in L upper chest above expander and under L axilla  POSTURE: forward head, rounded shoulers  UPPER EXTREMITY AROM/PROM:  A/PROM RIGHT   eval    Shoulder extension 70  Shoulder flexion 160  Shoulder abduction 170  Shoulder internal rotation 38  Shoulder external rotation 92    (Blank rows = not tested)  A/PROM LEFT   eval  Shoulder extension 50  Shoulder flexion 145  Shoulder abduction 159  Shoulder internal  rotation 45  Shoulder external rotation 91    (Blank rows = not tested)  LYMPHEDEMA ASSESSMENTS:   SURGERY TYPE/DATE: 01/06/21 L mastectomy and SLNB  NUMBER OF LYMPH NODES REMOVED: SLNB 4/5, then ALND 07/07/21 1/11  CHEMOTHERAPY: Completed 06/24/21  RADIATION:Completed 10/04/21  HORMONE TREATMENT: Currently taking exemestane  INFECTIONS: none   TODAY'S TREATMENT:   02/11/2022 Discussed compression bras and second to Petra Kuba as an option and that insurance may cover rather than buying on line. Discussed purchasing pulleys on [place] for home use. Overhead pulleys flexion and abd x 2 min, scaption 1 min to warm up   Soft tissue mobilization to left pectorals, UT and lats with cocoabutter in supine, with cocoa butter supine wand scaption x 5 MFR performed to LUE to cording extending from axilla down to forearm with several cords palpable in axilla with PROM and pinning into flexion and D2 . NTS while in D2 flex.   Standing lat stretch at laundry cart x 2                                                                                                                                 DATE:  01/27/2022 Discussed sleeve purchasing and she is opting to do it herself online at [place] since [employer] custom would have to put on next year and [facility] doesn't cover. Soft tissue mobilization to left pectorals, UT and lats with cocoabutter in supine, and in SL to UT and scapular area with cocoabutter supine wand flexion and scaption x 5 MFR performed to LUE to cording extending from axilla down to forearm with numerous cords palpable in axilla with PROM and pinning into flexion and D2 . Supine LTR with arms outstretched x  5 with knees to right, single arm pectoral stretch at doorway  with arm extended and low x 3. Also showed NTS in same position  01/24/22:  Supine wand flexion and scaption x 5 Soft tissue mobilization to left pectorals, UT and lats with cocoabutter MFR performed to LUE to cording extending from axilla down to forearm with numerous cords palpable in axilla with PROM and pinning into flexion and D2 . Supine LTR with arms outstretched x 5 with knees to right, single arm pectoral stretch at doorway x 3 Updated HEP    01/21/22: MFR performed to LUE to cording extending from axilla down to forearm with numerous cords palpable in axilla with PROM and pinning into flexion and D2 extension.  Checked fit of current bella strong sleeve which seems to be too long and pt feels like it is too tight causing pain.  Decided to switch to a bella lite but they did not have her color choice so we switched again to a juzo soft size 75mx regular and order was [facility] to [facility] [employer] use insurance and pt was given a handout for self ordering of the bella incase the black comes back in stock sometime.  01/19/22: MFR performed to LUE to cording extending from axilla down to forearm with numerous cords palpable in axilla  PATIENT EDUCATION:  Access Code: [email or web address] URL: [email or web address] Date: 01/24/2022 Prepared by: Cheral Almas  Exercises - Supine Lower Trunk Rotation  - 2 x daily - 7 x weekly - 1 sets - 3-5 reps - 10 hold - Supine Shoulder Flexion with Dowel  - 2 x daily - 7 x weekly - 1 sets - 3-5 reps - 10 hold - Single Arm Doorway Pec Stretch at 90 Degrees Abduction  - 2 x daily - 7 x weekly - 1 sets - 3-5 reps - 10 hold Education details: axillary cording and how heavy lifting can affect this, try compression bra for swelling, nature of cording and how it gets better but then tightens back up Person educated: Patient Education method: Explanation Education comprehension: verbalized  understanding  HOME EXERCISE PROGRAM: Try wearing compression bra for chest edema  ASSESSMENT:  CLINICAL IMPRESSION: Continued STM and MFR techniques to cording and PROM. Pt continues with significant tightness in her left pectorals. Cording is improved as is ROM and cording is less visible and palpable. Pt received her compression sleeve and has been wearing for work activities. She loosened up very well with pulleys and may want to consider purchasing. OBJECTIVE IMPAIRMENTS: decreased knowledge of condition, decreased knowledge of use of DME, decreased ROM, increased edema, increased fascial restrictions, impaired UE functional use, postural dysfunction, and pain.   ACTIVITY LIMITATIONS: carrying and lifting  PARTICIPATION LIMITATIONS: occupation  PERSONAL FACTORS: Time since onset of injury/illness/exacerbation are also affecting patient's functional outcome.   REHAB POTENTIAL: Good  CLINICAL DECISION MAKING: Stable/uncomplicated  EVALUATION COMPLEXITY: Low  GOALS: Goals reviewed with patient? Yes  SHORT TERM GOALS=LONG TERM GOALS Target date: 02/16/22  Pt will obtain an appropriate sleeve and glove that she is able to wear at work without discomfort.  Baseline: Goal status:MET 02/11/2022  2.  Pt will demonstrate 170 degrees of L shoulder abduction to allow her to reach out to the side.  Baseline: 159 Goal status: INITIAL  3.  Pt will be independent in self MLD for long term management of lymphedema.  Baseline:  Goal status: INITIAL  4.  Pt will report no tightness from cording at end range of left shoulder ROM to allow improved comfort.  Baseline:  Goal status: INITIAL  5.  Pt will be independent in a home exercise program for long term stretching and strengthening.  Baseline:  Goal status:MET 02/11/2022   PLAN:  PT FREQUENCY: 2x/week  PT DURATION: 4 weeks  PLANNED INTERVENTIONS: Therapeutic exercises, Therapeutic activity, Patient/Family education, Self Care,  Joint mobilization, Orthotic/Fit training, Manual lymph drainage, Compression bandaging, scar mobilization, Taping, Vasopneumatic device, Manual therapy, and Re-evaluation  PLAN FOR NEXT SESSION: how is compression bra? Check breast for swelling,Instruct in breast MLD,  STM to left pectorals,MFR for cording in LUE, check her current sleeve and glove for fit - if circular knit try flat knit - pt has discomfort after wearing sleeve all day (she has to lift at work - its prophylactic)   Claris Pong, PT 02/11/2022, 8:53 AM

## 2022-02-14 ENCOUNTER — Encounter (HOSPITAL_BASED_OUTPATIENT_CLINIC_OR_DEPARTMENT_OTHER): Payer: Self-pay | Admitting: Plastic Surgery

## 2022-02-14 ENCOUNTER — Other Ambulatory Visit: Payer: Self-pay

## 2022-02-14 ENCOUNTER — Ambulatory Visit: Payer: BC Managed Care – PPO

## 2022-02-14 DIAGNOSIS — Z9012 Acquired absence of left breast and nipple: Secondary | ICD-10-CM

## 2022-02-14 DIAGNOSIS — Z483 Aftercare following surgery for neoplasm: Secondary | ICD-10-CM

## 2022-02-14 DIAGNOSIS — M25612 Stiffness of left shoulder, not elsewhere classified: Secondary | ICD-10-CM

## 2022-02-14 DIAGNOSIS — M79602 Pain in left arm: Secondary | ICD-10-CM

## 2022-02-14 DIAGNOSIS — I972 Postmastectomy lymphedema syndrome: Secondary | ICD-10-CM

## 2022-02-14 NOTE — Therapy (Signed)
OUTPATIENT PHYSICAL THERAPY ONCOLOGY EVALUATION  Patient Name: Pamela Reyes MRN: [id number] DOB:1980/11/13, 42 y.o., female Today's Date: 02/14/2022  END OF SESSION:  PT End of Session - 02/14/22 1604     Visit Number 6    Number of Visits 9    Date for PT Re-Evaluation 02/16/22    Authorization Type BCBS    PT Start Time 1605    PT Stop Time 1655    PT Time Calculation (min) 50 min    Activity Tolerance Patient tolerated treatment well    Behavior During Therapy WFL for tasks assessed/performed             Past Medical History:  Diagnosis Date   Anxiety    Axillary pain, right 01/18/2021   Will get right axillary ultrasound.   Breast cancer ([place])    Breast pain, left 10/06/2020   Encounter for gynecological examination with Papanicolaou smear of cervix 02/10/2021   Encounter for screening fecal occult blood testing 02/10/2021   Family history of breast cancer 11/11/2020   Fibroid 10/27/2020   Has 1 x .9 x .6 cm subserosal fibroid   H/O left mastectomy    left mastectomy 2022   History of hiatal hernia    Left breast mass 10/06/2020   +breast cancer   LLQ pain 10/06/2020   PONV (postoperative nausea and vomiting)    Port-A-Cath in place 03/25/2021   Past Surgical History:  Procedure Laterality Date   AXILLARY LYMPH NODE DISSECTION Left 07/07/2021   Procedure: LEFT AXILLARY LYMPH NODE DISSECTION;  Surgeon: Stark Klein, MD;  Location: [facility];  Service: General;  Laterality: Left;   BREAST RECONSTRUCTION WITH PLACEMENT OF TISSUE EXPANDER AND FLEX HD (ACELLULAR HYDRATED DERMIS) Left 01/06/2021   Procedure: LEFT BREAST RECONSTRUCTION WITH PLACEMENT OF TISSUE EXPANDER AND ACELLULAR  DERMIS;  Surgeon: Irene Limbo, MD;  Location: [facility];  Service: Plastics;  Laterality: Left;   MASTECTOMY W/ SENTINEL NODE BIOPSY Left 01/06/2021   Procedure: LEFT MASTECTOMY WITH MAGTRACE;  Surgeon: Stark Klein, MD;  Location: [facility];  Service: General;   Laterality: Left;   PORT-A-CATH REMOVAL Right 07/07/2021   Procedure: REMOVAL PORT-A-CATH;  Surgeon: Stark Klein, MD;  Location: [facility];  Service: General;  Laterality: Right;   PORTACATH PLACEMENT Right 01/19/2021   Procedure: INSERTION PORT-A-CATH;  Surgeon: Stark Klein, MD;  Location: [facility] [place];  Service: General;  Laterality: Right;   RADIOACTIVE SEED GUIDED AXILLARY SENTINEL LYMPH NODE Left 01/06/2021   Procedure: RADIOACTIVE SEED GUIDED SENTINEL LYMPH NODE BIOPSY;  Surgeon: Stark Klein, MD;  Location: [facility];  Service: General;  Laterality: Left;   SENTINEL NODE BIOPSY Left 01/06/2021   Procedure: LEFT AXILLARY SENTINEL LYMPH  NODE BIOPSY;  Surgeon: Stark Klein, MD;  Location: [facility];  Service: General;  Laterality: Left;   [place] EXTRACTION     Patient Active Problem List   Diagnosis Date Noted   Burning with urination 10/25/2021   Urinary frequency 10/25/2021   Urgency of urination 10/25/2021   Left flank pain, chronic 09/27/2021   Paresthesia 06/08/2021   Port-A-Cath in place 03/25/2021   Genetic testing 11/25/2020   Family history of breast cancer 11/11/2020   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 lymph node, left ([place]) 11/11/2020   Dense breasts 11/11/2020   Malignant neoplasm of upper-outer quadrant of left breast in female, estrogen receptor positive ([place]) 11/10/2020   Fibroid 10/27/2020    PCP: Jeanie Sewer, NP  REFERRING PROVIDER: [name] MD  REFERRING DIAG: L UE cording; C50.412,Z17.0 (ICD-10-CM) - Malignant neoplasm of upper-outer quadrant of left breast in female, estrogen receptor positive ([place])   THERAPY DIAG:  Pain in left arm  Stiffness of left shoulder, not elsewhere classified  Aftercare following surgery for neoplasm  H/O left mastectomy  Postmastectomy lymphedema  ONSET DATE: End of October, 2023  Rationale for Evaluation and Treatment: Rehabilitation  SUBJECTIVE:                                                                                                                                                                                            SUBJECTIVE STATEMENT: I haven't got my bra yet but its on my list. I am wearing the compression bra that I have. I have my counts checked on Wednesday to see if I can have sx on Friday. Still having some trouble with cording. If I work all day I get pain in my breast from the expander.  PERTINENT HISTORY:  Diagnosed 10/29/2020 and found by self exam. Biopsy revealed IDC ER, PR +, HER 2 -.  Pt underwent left mastectomy on 01/06/2021 with expander. 4+/5 LN. Chemotherapy completed ACP, Lymph node dissection on 07/07/21 with 1/11 lymph nodes positive. Completed radiation at end of August 2023. PAIN:  Are you having pain? Yes NPRS scale: 2/10 today, but havent been working today Pain location: L UE, armpit and medial upper arm. Pain orientation: Left  PAIN TYPE: tight Pain description: intermittent  Aggravating factors: physical activity  Relieving factors: nothing  PRECAUTIONS: Other: at risk of lymphedema  WEIGHT BEARING RESTRICTIONS: No  FALLS:  Has patient fallen in last 6 months? 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 and [employer] of books  LEISURE: pt does not exercise outside of work  HAND DOMINANCE: right   PRIOR LEVEL OF FUNCTION: Independent  PATIENT GOALS: to get rid of the cording and prevent it from coming back   OBJECTIVE:  COGNITION: Overall cognitive status: Within functional limits for tasks assessed   PALPATION: Palpable cording from axilla down towards forearm  OBSERVATIONS / OTHER ASSESSMENTS: fullness in L upper chest above expander and under L axilla  POSTURE: forward head, rounded shoulers  UPPER  EXTREMITY AROM/PROM:  A/PROM RIGHT   eval   Shoulder extension 70  Shoulder flexion 160  Shoulder abduction 170  Shoulder internal rotation 38  Shoulder external rotation 92    (Blank rows = not tested)  A/PROM LEFT   eval LEFT 02/14/2022  Shoulder extension 50 47  Shoulder flexion 145 152  Shoulder abduction 159 165  Shoulder internal rotation 45 62  Shoulder external rotation 91 90    (Blank rows = not tested)  LYMPHEDEMA ASSESSMENTS:   SURGERY TYPE/DATE: 01/06/21 L mastectomy and SLNB  NUMBER OF LYMPH NODES REMOVED: SLNB 4/5, then ALND 07/07/21 1/11  CHEMOTHERAPY: Completed 06/24/21  RADIATION:Completed 10/04/21  HORMONE TREATMENT: Currently taking exemestane  INFECTIONS: none   TODAY'S TREATMENT:   02/14/2022 Overhead pulleys flexion and abd x 2 min, scaption 1 min to warm up   Soft tissue mobilization to left pectorals, UT and lats with cocoabutter in supine, and in SL to UT and scapular area and lats with cocoa butter MFR performed to LUE to cording extending from axilla down to forearm with several cords palpable in axilla with PROM and pinning into flexion and D2 . PROM left shoulder flexion, abduction and D2 flexion   AROM bilateral flexion, scaption, horizontal abduction x5   02/11/2022 Discussed compression bras and second to Petra Kuba as an option and that insurance may cover rather than buying on line. Discussed purchasing pulleys on [address] for home use. Overhead pulleys flexion and abd x 2 min, scaption 1 min to warm up   Soft tissue mobilization to left pectorals, UT and lats with cocoabutter in supine, with cocoa butter supine wand scaption x 5 MFR performed to LUE to cording extending from axilla down to forearm with several cords palpable in axilla with PROM and pinning into flexion and D2 . NTS while in D2 flex.   Standing lat stretch at laundry cart x 2                                                                                                                                  DATE:  01/27/2022 Discussed sleeve purchasing and she is opting to do it herself online at [facility] since [facility] custom would have to put on next year and [facility] doesn't cover. Soft tissue mobilization to left pectorals, UT and lats with cocoabutter in supine, and in SL to UT and scapular area with cocoabutter supine wand flexion and scaption x 5 MFR performed to LUE to cording extending from axilla down to forearm with numerous cords palpable in axilla with PROM and pinning into flexion and D2 . Supine LTR with arms outstretched x 5 with knees to right, single arm pectoral stretch at doorway  with arm extended and low x 3. Also showed NTS in same position  01/24/22:  Supine wand flexion and scaption x 5 Soft tissue mobilization to left pectorals, UT and lats with cocoabutter MFR performed to LUE to cording extending from axilla down to forearm with numerous cords palpable in axilla with PROM and pinning into flexion and D2 . Supine LTR with arms outstretched x 5 with knees to right, single arm pectoral stretch at doorway  x 3 Updated HEP    01/21/22: MFR performed to LUE to cording extending from axilla down to forearm with numerous cords palpable in axilla with PROM and pinning into flexion and D2 extension.  Checked fit of current bella strong sleeve which seems to be too long and pt feels like it is too tight causing pain.  Decided to switch to a bella lite but they did not have her color choice so we switched again to a juzo soft size 35mx regular and order was sent to [facility] [employer] use insurance and pt was given a handout for self ordering of the bella incase the black comes back in stock sometime.    01/19/22: MFR performed to LUE to cording extending from axilla down to forearm with numerous cords palpable in axilla  PATIENT EDUCATION:  Access Code: [email or web address]: [email or web address] Date: 01/24/2022 Prepared by: RCheral Almas Exercises - Supine Lower Trunk Rotation  - 2 x daily - 7 x weekly - 1 sets - 3-5 reps - 10 hold - Supine Shoulder Flexion with Dowel  - 2 x daily - 7 x weekly - 1 sets - 3-5 reps - 10 hold - Single Arm Doorway Pec Stretch at 90 Degrees Abduction  - 2 x daily - 7 x weekly - 1 sets - 3-5 reps - 10 hold Education details: axillary cording and how heavy lifting can affect this, try compression bra for swelling, nature of cording and how it gets better but then tightens back up Person educated: Patient Education method: Explanation Education comprehension: verbalized understanding  HOME EXERCISE PROGRAM: Try wearing compression bra for chest edema  ASSESSMENT:  CLINICAL IMPRESSION: Pt has a lot on her mind with her next surgery coming up on Friday if her blood count improves. She is progressing well with ROM but has not yet achieved goals. Cording is still present and limiting at end ranges but is not nearly as visible. Will hold on therapy until after pts next surgery and she will contact [facility] resume.  OBJECTIVE IMPAIRMENTS: decreased knowledge of condition, decreased knowledge of use of DME, decreased ROM, increased edema, increased fascial restrictions, impaired UE functional use, postural dysfunction, and pain.   ACTIVITY LIMITATIONS: carrying and lifting  PARTICIPATION LIMITATIONS: occupation  PERSONAL FACTORS: Time since onset of injury/illness/exacerbation are also affecting patient's functional outcome.   REHAB POTENTIAL: Good  CLINICAL DECISION MAKING: Stable/uncomplicated  EVALUATION COMPLEXITY: Low  GOALS: Goals reviewed with patient? Yes  SHORT TERM GOALS=LONG TERM GOALS Target date: 02/16/22  Pt will obtain an appropriate sleeve and glove that she is able to wear at work without discomfort.  Baseline: Goal status:MET 02/11/2022  2.  Pt will demonstrate 170 degrees of L shoulder abduction to allow her to reach out to the side.  Baseline: 159 Goal status: IN  progress  3.  Pt will be independent in self MLD for long term management of lymphedema.  Baseline:  Goal status: In Progress  4.  Pt will report no tightness from cording at end range of left shoulder ROM to allow improved comfort.  Baseline:  Goal status: In Progress 5.  Pt will be independent in a home exercise program for long term stretching and strengthening.  Baseline:  Goal status:MET 02/11/2022   PLAN:  PT FREQUENCY: 2x/week  PT DURATION: 4 weeks  PLANNED INTERVENTIONS: Therapeutic exercises, Therapeutic activity, Patient/Family education, Self Care, Joint mobilization, Orthotic/Fit training, Manual lymph drainage, Compression bandaging, scar mobilization, Taping, Vasopneumatic device, Manual therapy, and Re-evaluation  PLAN FOR NEXT SESSION: how is compression bra? Check breast for swelling,Instruct in breast MLD,  STM to left pectorals,MFR for cording in LUE, check her current sleeve and glove for fit - if circular knit try flat knit - pt has discomfort after wearing sleeve all day (she has to lift at work - its prophylactic)   Claris Pong, PT 02/14/2022, 5:01 PM

## 2022-02-15 ENCOUNTER — Encounter (HOSPITAL_BASED_OUTPATIENT_CLINIC_OR_DEPARTMENT_OTHER)
Admission: RE | Admit: 2022-02-15 | Discharge: 2022-02-15 | Disposition: A | Discharge status: Home / Self Care | Payer: BC Managed Care – PPO | Source: Ambulatory Visit | Attending: Plastic Surgery | Admitting: Plastic Surgery

## 2022-02-15 NOTE — Progress Notes (Signed)
Patient made aware that lab work needs to be done tomorrow per Dr. Para Skeans orders. Patient verbalized understanding and will return tomorrow morning for lab work.

## 2022-02-15 NOTE — Progress Notes (Signed)

## 2022-02-16 ENCOUNTER — Encounter (HOSPITAL_BASED_OUTPATIENT_CLINIC_OR_DEPARTMENT_OTHER)
Admission: RE | Admit: 2022-02-16 | Discharge: 2022-02-16 | Disposition: A | Discharge status: Home / Self Care | Payer: BC Managed Care – PPO | Source: Ambulatory Visit | Attending: Plastic Surgery | Admitting: Plastic Surgery

## 2022-02-16 ENCOUNTER — Ambulatory Visit: Payer: BC Managed Care – PPO

## 2022-02-16 DIAGNOSIS — Z87891 Personal history of nicotine dependence: Secondary | ICD-10-CM | POA: Diagnosis not present

## 2022-02-16 DIAGNOSIS — Z853 Personal history of malignant neoplasm of breast: Secondary | ICD-10-CM | POA: Diagnosis not present

## 2022-02-16 DIAGNOSIS — Z9012 Acquired absence of left breast and nipple: Secondary | ICD-10-CM | POA: Diagnosis not present

## 2022-02-16 DIAGNOSIS — Z421 Encounter for breast reconstruction following mastectomy: Secondary | ICD-10-CM | POA: Diagnosis present

## 2022-02-16 DIAGNOSIS — Z923 Personal history of irradiation: Secondary | ICD-10-CM | POA: Diagnosis not present

## 2022-02-16 LAB — CBC WITH DIFFERENTIAL/PLATELET
Abs Immature Granulocytes: 0.01 10*3/uL (ref 0.00–0.07)
Basophils Absolute: 0.1 10*3/uL (ref 0.0–0.1)
Basophils Relative: 2 %
Eosinophils Absolute: 0 10*3/uL (ref 0.0–0.5)
Eosinophils Relative: 1 %
HCT: 38.8 % (ref 36.0–46.0)
Hemoglobin: 13.9 g/dL (ref 12.0–15.0)
Immature Granulocytes: 0 %
Lymphocytes Relative: 34 %
Lymphs Abs: 0.9 10*3/uL (ref 0.7–4.0)
MCH: 33.3 pg (ref 26.0–34.0)
MCHC: 35.8 g/dL (ref 30.0–36.0)
MCV: 93 fL (ref 80.0–100.0)
Monocytes Absolute: 0.2 10*3/uL (ref 0.1–1.0)
Monocytes Relative: 9 %
Neutro Abs: 1.4 10*3/uL — ABNORMAL LOW (ref 1.7–7.7)
Neutrophils Relative %: 54 %
Platelets: 247 10*3/uL (ref 150–400)
RBC: 4.17 MIL/uL (ref 3.87–5.11)
RDW: 13.2 % (ref 11.5–15.5)
WBC: 2.6 10*3/uL — ABNORMAL LOW (ref 4.0–10.5)
nRBC: 0 % (ref 0.0–0.2)

## 2022-02-17 ENCOUNTER — Ambulatory Visit (HOSPITAL_COMMUNITY)
Admission: RE | Admit: 2022-02-17 | Discharge: 2022-02-17 | Disposition: A | Discharge status: Home / Self Care | Payer: BC Managed Care – PPO | Source: Ambulatory Visit | Attending: General Surgery | Admitting: General Surgery

## 2022-02-17 DIAGNOSIS — R0789 Other chest pain: Secondary | ICD-10-CM | POA: Diagnosis not present

## 2022-02-17 DIAGNOSIS — Z86711 Personal history of pulmonary embolism: Secondary | ICD-10-CM | POA: Insufficient documentation

## 2022-02-17 MED ORDER — IOHEXOL 350 MG/ML SOLN
75.0000 mL | Freq: Once | INTRAVENOUS | Status: AC | PRN
  Administered 2022-02-17: 75 mL via INTRAVENOUS

## 2022-02-18 ENCOUNTER — Ambulatory Visit (HOSPITAL_BASED_OUTPATIENT_CLINIC_OR_DEPARTMENT_OTHER): Payer: BC Managed Care – PPO | Admitting: Anesthesiology

## 2022-02-18 ENCOUNTER — Ambulatory Visit (HOSPITAL_BASED_OUTPATIENT_CLINIC_OR_DEPARTMENT_OTHER)
Admission: RE | Admit: 2022-02-18 | Discharge: 2022-02-18 | Disposition: A | Discharge status: Home / Self Care | Payer: BC Managed Care – PPO | Source: Ambulatory Visit | Attending: Plastic Surgery | Admitting: Plastic Surgery

## 2022-02-18 ENCOUNTER — Encounter (HOSPITAL_BASED_OUTPATIENT_CLINIC_OR_DEPARTMENT_OTHER)
Admission: RE | Disposition: A | Discharge status: Home / Self Care | Payer: Self-pay | Source: Ambulatory Visit | Attending: Plastic Surgery

## 2022-02-18 ENCOUNTER — Encounter (HOSPITAL_BASED_OUTPATIENT_CLINIC_OR_DEPARTMENT_OTHER): Payer: Self-pay | Admitting: Plastic Surgery

## 2022-02-18 ENCOUNTER — Other Ambulatory Visit: Payer: Self-pay

## 2022-02-18 DIAGNOSIS — Z421 Encounter for breast reconstruction following mastectomy: Secondary | ICD-10-CM | POA: Diagnosis not present

## 2022-02-18 DIAGNOSIS — Z853 Personal history of malignant neoplasm of breast: Secondary | ICD-10-CM | POA: Insufficient documentation

## 2022-02-18 DIAGNOSIS — Z01818 Encounter for other preprocedural examination: Secondary | ICD-10-CM

## 2022-02-18 DIAGNOSIS — Z9012 Acquired absence of left breast and nipple: Secondary | ICD-10-CM | POA: Insufficient documentation

## 2022-02-18 DIAGNOSIS — Z170 Estrogen receptor positive status [ER+]: Secondary | ICD-10-CM

## 2022-02-18 DIAGNOSIS — Z923 Personal history of irradiation: Secondary | ICD-10-CM | POA: Insufficient documentation

## 2022-02-18 DIAGNOSIS — Z87891 Personal history of nicotine dependence: Secondary | ICD-10-CM | POA: Insufficient documentation

## 2022-02-18 HISTORY — PX: REMOVAL OF TISSUE EXPANDER AND PLACEMENT OF IMPLANT: SHX6457

## 2022-02-18 LAB — POCT PREGNANCY, URINE: Preg Test, Ur: NEGATIVE

## 2022-02-18 SURGERY — REMOVAL, TISSUE EXPANDER, BREAST, WITH IMPLANT INSERTION
Anesthesia: General | Site: Chest | Laterality: Left

## 2022-02-18 MED ORDER — GABAPENTIN 300 MG PO CAPS
300.0000 mg | ORAL_CAPSULE | ORAL | Status: AC
  Administered 2022-02-18: 300 mg via ORAL

## 2022-02-18 MED ORDER — ONDANSETRON HCL 4 MG/2ML IJ SOLN
4.0000 mg | Freq: Once | INTRAMUSCULAR | Status: AC | PRN
  Administered 2022-02-18: 4 mg via INTRAVENOUS

## 2022-02-18 MED ORDER — ACETAMINOPHEN 325 MG PO TABS: 325.0000 mg | ORAL_TABLET | ORAL | Status: DC | PRN

## 2022-02-18 MED ORDER — SCOPOLAMINE 1 MG/3DAYS TD PT72
1.0000 | MEDICATED_PATCH | TRANSDERMAL | Status: DC
  Administered 2022-02-18: 1.5 mg via TRANSDERMAL

## 2022-02-18 MED ORDER — CELECOXIB 200 MG PO CAPS
200.0000 mg | ORAL_CAPSULE | ORAL | Status: AC
  Administered 2022-02-18: 200 mg via ORAL

## 2022-02-18 MED ORDER — GABAPENTIN 300 MG PO CAPS
ORAL_CAPSULE | ORAL | Status: AC
  Filled 2022-02-18: qty 1

## 2022-02-18 MED ORDER — BUPIVACAINE HCL (PF) 0.25 % IJ SOLN
INTRAMUSCULAR | Status: AC
  Filled 2022-02-18: qty 30

## 2022-02-18 MED ORDER — SCOPOLAMINE 1 MG/3DAYS TD PT72
MEDICATED_PATCH | TRANSDERMAL | Status: AC
  Filled 2022-02-18: qty 1

## 2022-02-18 MED ORDER — FENTANYL CITRATE (PF) 100 MCG/2ML IJ SOLN
INTRAMUSCULAR | Status: DC | PRN
  Administered 2022-02-18: 50 ug via INTRAVENOUS

## 2022-02-18 MED ORDER — OXYCODONE HCL 5 MG PO TABS: 5.0000 mg | ORAL_TABLET | Freq: Once | ORAL | Status: DC | PRN

## 2022-02-18 MED ORDER — LIDOCAINE HCL (CARDIAC) PF 100 MG/5ML IV SOSY
PREFILLED_SYRINGE | INTRAVENOUS | Status: DC | PRN
  Administered 2022-02-18: 100 mg via INTRAVENOUS

## 2022-02-18 MED ORDER — HYDROMORPHONE HCL 1 MG/ML IJ SOLN
INTRAMUSCULAR | Status: AC
  Filled 2022-02-18: qty 1

## 2022-02-18 MED ORDER — DEXAMETHASONE SODIUM PHOSPHATE 4 MG/ML IJ SOLN
INTRAMUSCULAR | Status: DC | PRN
  Administered 2022-02-18: 5 mg via INTRAVENOUS

## 2022-02-18 MED ORDER — FENTANYL CITRATE (PF) 100 MCG/2ML IJ SOLN: 25.0000 ug | INTRAMUSCULAR | Status: DC | PRN

## 2022-02-18 MED ORDER — CELECOXIB 200 MG PO CAPS
ORAL_CAPSULE | ORAL | Status: AC
  Filled 2022-02-18: qty 1

## 2022-02-18 MED ORDER — SODIUM CHLORIDE 0.9 % IV SOLN
INTRAVENOUS | Status: DC | PRN
  Administered 2022-02-18: 500 mL

## 2022-02-18 MED ORDER — MIDAZOLAM HCL 5 MG/5ML IJ SOLN
INTRAMUSCULAR | Status: DC | PRN
  Administered 2022-02-18: 2 mg via INTRAVENOUS

## 2022-02-18 MED ORDER — BUPIVACAINE HCL (PF) 0.5 % IJ SOLN
INTRAMUSCULAR | Status: AC
  Filled 2022-02-18: qty 30

## 2022-02-18 MED ORDER — CEFAZOLIN SODIUM-DEXTROSE 2-4 GM/100ML-% IV SOLN
2.0000 g | INTRAVENOUS | Status: AC
  Administered 2022-02-18: 2 g via INTRAVENOUS

## 2022-02-18 MED ORDER — FENTANYL CITRATE (PF) 100 MCG/2ML IJ SOLN
INTRAMUSCULAR | Status: AC
  Filled 2022-02-18: qty 2

## 2022-02-18 MED ORDER — ACETAMINOPHEN 160 MG/5ML PO SOLN: 325.0000 mg | ORAL | Status: DC | PRN

## 2022-02-18 MED ORDER — LACTATED RINGERS IV SOLN: INTRAVENOUS | Status: DC

## 2022-02-18 MED ORDER — ONDANSETRON HCL 4 MG/2ML IJ SOLN
INTRAMUSCULAR | Status: AC
  Filled 2022-02-18: qty 2

## 2022-02-18 MED ORDER — BUPIVACAINE HCL (PF) 0.5 % IJ SOLN
INTRAMUSCULAR | Status: DC | PRN
  Administered 2022-02-18: 20 mL

## 2022-02-18 MED ORDER — ACETAMINOPHEN 500 MG PO TABS: 1000.0000 mg | ORAL_TABLET | ORAL | Status: DC

## 2022-02-18 MED ORDER — MIDAZOLAM HCL 2 MG/2ML IJ SOLN
INTRAMUSCULAR | Status: AC
  Filled 2022-02-18: qty 2

## 2022-02-18 MED ORDER — CEFAZOLIN SODIUM-DEXTROSE 2-4 GM/100ML-% IV SOLN
INTRAVENOUS | Status: AC
  Filled 2022-02-18: qty 100

## 2022-02-18 MED ORDER — CHLORHEXIDINE GLUCONATE CLOTH 2 % EX PADS: 6.0000 | MEDICATED_PAD | Freq: Once | CUTANEOUS | Status: DC

## 2022-02-18 MED ORDER — PROPOFOL 10 MG/ML IV BOLUS
INTRAVENOUS | Status: DC | PRN
  Administered 2022-02-18: 150 mg via INTRAVENOUS

## 2022-02-18 MED ORDER — MEPERIDINE HCL 25 MG/ML IJ SOLN: 6.2500 mg | INTRAMUSCULAR | Status: DC | PRN

## 2022-02-18 MED ORDER — AMISULPRIDE (ANTIEMETIC) 5 MG/2ML IV SOLN
10.0000 mg | Freq: Once | INTRAVENOUS | Status: AC
  Administered 2022-02-18: 10 mg via INTRAVENOUS

## 2022-02-18 MED ORDER — AMISULPRIDE (ANTIEMETIC) 5 MG/2ML IV SOLN
INTRAVENOUS | Status: AC
  Filled 2022-02-18: qty 4

## 2022-02-18 MED ORDER — ONDANSETRON HCL 4 MG/2ML IJ SOLN
INTRAMUSCULAR | Status: DC | PRN
  Administered 2022-02-18: 4 mg via INTRAVENOUS

## 2022-02-18 MED ORDER — SODIUM CHLORIDE 0.9 % IV SOLN
INTRAVENOUS | Status: AC
  Filled 2022-02-18: qty 10

## 2022-02-18 MED ORDER — OXYCODONE HCL 5 MG/5ML PO SOLN: 5.0000 mg | Freq: Once | ORAL | Status: DC | PRN

## 2022-02-18 SURGICAL SUPPLY — 70 items
ADH SKN CLS APL DERMABOND .7 (GAUZE/BANDAGES/DRESSINGS) ×2
APL PRP STRL LF DISP 70% ISPRP (MISCELLANEOUS) ×1
BAG DECANTER FOR FLEXI CONT (MISCELLANEOUS) ×1 IMPLANT
BINDER BREAST LRG (GAUZE/BANDAGES/DRESSINGS) IMPLANT
BINDER BREAST MEDIUM (GAUZE/BANDAGES/DRESSINGS) IMPLANT
BINDER BREAST XLRG (GAUZE/BANDAGES/DRESSINGS) IMPLANT
BINDER BREAST XXLRG (GAUZE/BANDAGES/DRESSINGS) IMPLANT
BLADE SURG 10 STRL SS (BLADE) ×1 IMPLANT
BLADE SURG 15 STRL LF DISP TIS (BLADE) IMPLANT
BLADE SURG 15 STRL SS (BLADE)
BNDG GAUZE DERMACEA FLUFF 4 (GAUZE/BANDAGES/DRESSINGS) ×2 IMPLANT
BNDG GZE DERMACEA 4 6PLY (GAUZE/BANDAGES/DRESSINGS) ×2
CANISTER SUCT 1200ML W/VALVE (MISCELLANEOUS) ×1 IMPLANT
CHLORAPREP W/TINT 26 (MISCELLANEOUS) ×1 IMPLANT
COVER BACK TABLE 60X90IN (DRAPES) ×1 IMPLANT
COVER MAYO STAND STRL (DRAPES) ×1 IMPLANT
DERMABOND ADVANCED .7 DNX12 (GAUZE/BANDAGES/DRESSINGS) ×2 IMPLANT
DRAIN CHANNEL 15F RND FF W/TCR (WOUND CARE) IMPLANT
DRAPE TOP ARMCOVERS (MISCELLANEOUS) ×1 IMPLANT
DRAPE U-SHAPE 76X120 STRL (DRAPES) ×1 IMPLANT
DRAPE UTILITY XL STRL (DRAPES) ×1 IMPLANT
ELECT BLADE 4.0 EZ CLEAN MEGAD (MISCELLANEOUS) ×1
ELECT COATED BLADE 2.86 ST (ELECTRODE) ×1 IMPLANT
ELECT REM PT RETURN 9FT ADLT (ELECTROSURGICAL) ×1
ELECTRODE BLDE 4.0 EZ CLN MEGD (MISCELLANEOUS) ×1 IMPLANT
ELECTRODE REM PT RTRN 9FT ADLT (ELECTROSURGICAL) ×1 IMPLANT
EVACUATOR SILICONE 100CC (DRAIN) IMPLANT
GAUZE PAD ABD 8X10 STRL (GAUZE/BANDAGES/DRESSINGS) ×2 IMPLANT
GLOVE BIO SURGEON STRL SZ 6 (GLOVE) ×2 IMPLANT
GOWN STRL REUS W/ TWL LRG LVL3 (GOWN DISPOSABLE) ×2 IMPLANT
GOWN STRL REUS W/TWL LRG LVL3 (GOWN DISPOSABLE) ×2
IMPL BREAST SALINE 240CC (Breast) IMPLANT
IMPLANT BREAST SALINE 240CC (Breast) ×1 IMPLANT
IV NS 1000ML (IV SOLUTION)
IV NS 1000ML BAXH (IV SOLUTION) IMPLANT
IV NS 500ML (IV SOLUTION)
IV NS 500ML BAXH (IV SOLUTION) IMPLANT
MARKER SKIN DUAL TIP RULER LAB (MISCELLANEOUS) IMPLANT
NDL FILTER BLUNT 18X1 1/2 (NEEDLE) IMPLANT
NDL HYPO 25X1 1.5 SAFETY (NEEDLE) IMPLANT
NEEDLE FILTER BLUNT 18X1 1/2 (NEEDLE) IMPLANT
NEEDLE HYPO 25X1 1.5 SAFETY (NEEDLE) IMPLANT
PACK BASIN DAY SURGERY FS (CUSTOM PROCEDURE TRAY) ×1 IMPLANT
PENCIL SMOKE EVACUATOR (MISCELLANEOUS) ×1 IMPLANT
PIN SAFETY STERILE (MISCELLANEOUS) IMPLANT
SHEET MEDIUM DRAPE 40X70 STRL (DRAPES) ×2 IMPLANT
SIZER BREAST SGL USE 240CC (SIZER) ×1
SIZER BREAST SGL USE 270CC (SIZER) ×1
SIZER BRST SGL USE 240CC (SIZER) IMPLANT
SIZER BRST SGL USE 270CC (SIZER) IMPLANT
SLEEVE SCD COMPRESS KNEE MED (STOCKING) ×1 IMPLANT
SPIKE FLUID TRANSFER (MISCELLANEOUS) IMPLANT
SPONGE T-LAP 18X18 ~~LOC~~+RFID (SPONGE) ×2 IMPLANT
STAPLER VISISTAT 35W (STAPLE) ×1 IMPLANT
SUT ETHILON 2 0 FS 18 (SUTURE) IMPLANT
SUT MNCRL AB 4-0 PS2 18 (SUTURE) ×1 IMPLANT
SUT PDS AB 2-0 CT2 27 (SUTURE) IMPLANT
SUT SILK 2 0 SH (SUTURE) IMPLANT
SUT VIC AB 3-0 PS1 18 (SUTURE)
SUT VIC AB 3-0 PS1 18XBRD (SUTURE) IMPLANT
SUT VIC AB 3-0 SH 27 (SUTURE) ×1
SUT VIC AB 3-0 SH 27X BRD (SUTURE) ×1 IMPLANT
SUT VICRYL 4-0 PS2 18IN ABS (SUTURE) ×1 IMPLANT
SYR 20ML LL LF (SYRINGE) IMPLANT
SYR BULB IRRIG 60ML STRL (SYRINGE) ×2 IMPLANT
SYR CONTROL 10ML LL (SYRINGE) IMPLANT
TOWEL GREEN STERILE FF (TOWEL DISPOSABLE) ×2 IMPLANT
TUBE CONNECTING 20X1/4 (TUBING) ×2 IMPLANT
UNDERPAD 30X36 HEAVY ABSORB (UNDERPADS AND DIAPERS) ×2 IMPLANT
YANKAUER SUCT BULB TIP NO VENT (SUCTIONS) ×1 IMPLANT

## 2022-02-18 NOTE — Anesthesia Procedure Notes (Signed)
Procedure Name: LMA Insertion Date/Time: 02/18/2022 11:44 AM  Performed by: Willa Frater, CRNAPre-anesthesia Checklist: Patient identified, Emergency Drugs available, Suction available and Patient being monitored Patient Re-evaluated:Patient Re-evaluated prior to induction Oxygen Delivery Method: Circle system utilized Preoxygenation: Pre-oxygenation with 100% oxygen Induction Type: IV induction Ventilation: Mask ventilation without difficulty LMA: LMA inserted LMA Size: 3.0 Number of attempts: 1 Airway Equipment and Method: Bite block Placement Confirmation: positive ETCO2 Tube secured with: Tape Dental Injury: Teeth and Oropharynx as per pre-operative assessment

## 2022-02-18 NOTE — Anesthesia Preprocedure Evaluation (Addendum)
Anesthesia Evaluation  Patient identified by MRN, date of birth, ID band Patient awake    Reviewed: Allergy & Precautions, H&P , NPO status , Patient's Chart, lab work & pertinent test results, reviewed documented beta blocker date and time   History of Anesthesia Complications (+) PONV and history of anesthetic complications  Airway Mallampati: II  TM Distance: >3 FB Neck ROM: full    Dental no notable dental hx. (+) Teeth Intact, Dental Advisory Given   Pulmonary neg pulmonary ROS, former smoker   Pulmonary exam normal breath sounds clear to auscultation       Cardiovascular Exercise Tolerance: Good negative cardio ROS Normal cardiovascular exam Rhythm:regular Rate:Normal     Neuro/Psych   Anxiety     negative neurological ROS  negative psych ROS   GI/Hepatic negative GI ROS, Neg liver ROS, hiatal hernia,,,  Endo/Other  negative endocrine ROS    Renal/GU negative Renal ROS  negative genitourinary   Musculoskeletal   Abdominal   Peds  Hematology negative hematology ROS (+)   Anesthesia Other Findings   Reproductive/Obstetrics negative OB ROS                             Anesthesia Physical Anesthesia Plan  ASA: 2  Anesthesia Plan: General   Post-op Pain Management: Minimal or no pain anticipated   Induction: Intravenous  PONV Risk Score and Plan: 4 or greater and Ondansetron, Dexamethasone, Treatment may vary due to age or medical condition, Scopolamine patch - Pre-op and Midazolam  Airway Management Planned: LMA  Additional Equipment: None  Intra-op Plan:   Post-operative Plan:   Informed Consent: I have reviewed the patients History and Physical, chart, labs and discussed the procedure including the risks, benefits and alternatives for the proposed anesthesia with the patient or authorized representative who has indicated his/her understanding and acceptance.        Plan Discussed with: CRNA and Anesthesiologist  Anesthesia Plan Comments: ( )        Anesthesia Quick Evaluation

## 2022-02-18 NOTE — Op Note (Signed)
Operative Note   DATE OF OPERATION: 1.12.24  LOCATION: [facility]  SURGICAL DIVISION: Plastic Surgery  PREOPERATIVE DIAGNOSES:  1. History left breast cancer 2. Acquired absence breast 3. History therapeutic radiation  POSTOPERATIVE DIAGNOSES:  same  PROCEDURE:  Removal left chest tissue expander and placement saline implnst  SURGEON: Irene Limbo MD MBA  ASSISTANT: none  ANESTHESIA:  General.   EBL: 15 ml  COMPLICATIONS: None immediate.   INDICATIONS FOR PROCEDURE:  The patient, Pamela Reyes, is a 42 y.o. female born on 1980-06-24, is here for staged breast reconstruction following skin sparing mastectomy and immediate expander acellular dermis prepectoral reconstruction.   FINDINGS: Completed incorporation ADM noted. Natrelle Moderate Profile Smooth Round Saline 240 ml implant placed. REF [id number] SN [id number] fill volume 240 ml  DESCRIPTION OF PROCEDURE:  The patient's operative site was marked with the patient in the preoperative area. The patient was taken to the operating room. SCDs were placed and IV antibiotics were given. The patient's operative site was prepped and draped in a sterile fashion. A time out was performed and all information was confirmed to be correct. Incision made in prior mastectomy scar and carried through superficial fascia and acellular dermis. Expander removed. Inferior capsulotomy performed. Sizer placed and patient brought to upright sitting position. A 240 ml moderate profile implant selected. Patient returned to supine position. Cavity irrigated with saline solution containing Ancef, gentamicin, and bacitracin.Hemostasis ensured. Local anesthetic infiltrated. Implant prepared and placed in cavity. Implant filled to 240 ml. Fill tubing removed. Tab closure and implant orientation ensured. Closure completed with 3-0 vicryl in superficial fascia, 4-0 vicryl in dermis, and 4-0 monocryl subcuticular skin closure. Dermabond applied. Dry dressing  and breast binder applied.   The patient was allowed to wake from anesthesia, extubated and taken to the recovery room in satisfactory condition.   SPECIMENS: none  DRAINS: none  Irene Limbo, MD [facility]  Office/ physician access line after hours [phone]

## 2022-02-18 NOTE — Anesthesia Postprocedure Evaluation (Signed)
Anesthesia Post Note  Patient: Pamela Reyes  Procedure(s) Performed: REMOVAL OF TISSUE EXPANDER AND PLACEMENT OF IMPLANT (Left: Chest)     Patient location during evaluation: PACU Anesthesia Type: General Level of consciousness: sedated Pain management: pain level controlled Vital Signs Assessment: post-procedure vital signs reviewed and stable Respiratory status: spontaneous breathing and respiratory function stable Cardiovascular status: stable Postop Assessment: no apparent nausea or vomiting Anesthetic complications: no   No notable events documented.  Last Vitals:  Vitals:   02/18/22 1325 02/18/22 1343  BP: 122/77 (!) 148/70  Pulse: 86 88  Resp: 11 16  Temp:  36.5 C  SpO2: 100% 100%    Last Pain:  Vitals:   02/18/22 1343  TempSrc:   PainSc: 0-No pain                 Thai Burgueno DANIEL

## 2022-02-18 NOTE — Interval H&P Note (Signed)
History and Physical Interval Note:  02/18/2022 11:03 AM  Pamela Reyes  has presented today for surgery, with the diagnosis of hx breast ca, acquired absence breast, hx therapeutic radiation.  The various methods of treatment have been discussed with the patient and family. After consideration of risks, benefits and other options for treatment, the patient has consented to  Procedure(s): REMOVAL OF TISSUE EXPANDER AND PLACEMENT OF IMPLANT (Left) as a surgical intervention.  The patient's history has been reviewed, patient examined, no change in status, stable for surgery.  I have reviewed the patient's chart and labs.  Questions were answered to the patient's satisfaction.     Arnoldo Hooker Allani Reber

## 2022-02-18 NOTE — Transfer of Care (Signed)
Immediate Anesthesia Transfer of Care Note  Patient: Pamela Reyes  Procedure(s) Performed: REMOVAL OF TISSUE EXPANDER AND PLACEMENT OF IMPLANT (Left: Chest)  Patient Location: PACU  Anesthesia Type:General  Level of Consciousness: sedated  Airway & Oxygen Therapy: Patient Spontanous Breathing and Patient connected to face mask oxygen  Post-op Assessment: Report given to RN and Post -op Vital signs reviewed and stable  Post vital signs: Reviewed and stable  Last Vitals:  Vitals Value Taken Time  BP    Temp    Pulse    Resp    SpO2      Last Pain:  Vitals:   02/18/22 1001  TempSrc: Oral  PainSc: 0-No pain         Complications: No notable events documented.

## 2022-02-18 NOTE — Discharge Instructions (Addendum)
May give Tylenol after 4pm, if needed.  May give NSAIDS (ibuprofen, motrin) after 4 pm, if needed.    Post Anesthesia Home Care Instructions  Activity: Get plenty of rest for the remainder of the day. A responsible individual must stay with you for 24 hours following the procedure.  For the next 24 hours, DO NOT: -Drive a car -Paediatric nurse -Drink alcoholic beverages -Take any medication unless instructed by your physician -Make any legal decisions or sign important papers.  Meals: Start with liquid foods such as gelatin or soup. Progress to regular foods as tolerated. Avoid greasy, spicy, heavy foods. If nausea and/or vomiting occur, drink only clear liquids until the nausea and/or vomiting subsides. Call your physician if vomiting continues.  Special Instructions/Symptoms: Your throat may feel dry or sore from the anesthesia or the breathing tube placed in your throat during surgery. If this causes discomfort, gargle with warm salt water. The discomfort should disappear within 24 hours.  If you had a scopolamine patch placed behind your ear for the management of post- operative nausea and/or vomiting:  1. The medication in the patch is effective for 72 hours, after which it should be removed.  Wrap patch in a tissue and discard in the trash. Wash hands thoroughly with soap and water. 2. You may remove the patch earlier than 72 hours if you experience unpleasant side effects which may include dry mouth, dizziness or visual disturbances. 3. Avoid touching the patch. Wash your hands with soap and water after contact with the patch.

## 2022-02-21 ENCOUNTER — Encounter (HOSPITAL_BASED_OUTPATIENT_CLINIC_OR_DEPARTMENT_OTHER): Payer: Self-pay | Admitting: Plastic Surgery

## 2022-02-22 ENCOUNTER — Encounter (HOSPITAL_BASED_OUTPATIENT_CLINIC_OR_DEPARTMENT_OTHER): Payer: Self-pay | Admitting: Plastic Surgery

## 2022-03-01 ENCOUNTER — Other Ambulatory Visit: Payer: Self-pay | Admitting: Hematology

## 2022-03-01 ENCOUNTER — Other Ambulatory Visit: Payer: Self-pay | Admitting: Family

## 2022-03-01 DIAGNOSIS — R072 Precordial pain: Secondary | ICD-10-CM

## 2022-03-02 NOTE — Progress Notes (Unsigned)
[place]   [phone] [phone]   Clinic Follow up Note   Patient Care Team: Jeanie Sewer, NP as PCP - General (Family Medicine) Mauro Kaufmann, RN as Oncology Nurse Navigator Rockwell Germany, RN as Oncology Nurse Navigator Stark Klein, MD as Consulting Physician (General Surgery) [name], MD as Consulting Physician (Hematology) Kyung Rudd, MD as Consulting Physician (Radiation Oncology) Estill Dooms, NP as Nurse Practitioner (Obstetrics and Gynecology) Alla Feeling, NP as Nurse Practitioner (Nurse Practitioner)  Date of Service:  03/03/2022  CHIEF COMPLAINT: f/u of left breast cancer   CURRENT THERAPY:  Verzenio 150 mg twice daily, monthly Zoladex injections, and daily exemestane   ASSESSMENT:  Pamela Reyes is a 42 y.o. 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 Biopsy on 11/05/20 confirmed invasive ductal carcinoma, with metastatic involvement of one lymph node. -staging CT CAP and bone scan on 11/19/20 showed no distant metastasis -she underwent left mastectomy on 01/06/21 under Dr. Barry Dienes. Pathology showed 6.5 cm invasive ductal carcinoma, grade 2, involving dermis of nipple without epidermal involvement. Margins were negative, but all 4 lymph nodes showed metastatic disease. -s/p 4 cycles AC 02/11/21 - 03/25/21 and Abraxane 04/08/21 - 06/24/21. -PET scan on 06/16/21 showed no convincing evidence of local recurrence or metastatic disease. -left ALND on 07/07/21 with Dr. Barry Dienes, path showed 1/11 positive nodes -s/p postmastectomy radiation by Dr. Lisbeth Renshaw 08/16/21 - 10/04/21. -she started Zoladex in 07/07/21 and letrozole on 07/21/21. She developed worsening joint pain and was switched to exemestane  in early 10/2021.  -she started Verzenio '100mg'$  BID on 10/28/21, dose increased to '150mg'$  bid later. She had UTI, fatigue etc, but overall tolerating better now -she had reconsult surgery on February 18, 2022, Verzenio was held before surgery.  She has recovered well from surgery, will resume Verzenio in next few days. -She have noticed more joint pain from exemestane lately, I encouraged her to exercise, it is tolerable now.  Will continue.  However if her joint pain gets much worse, we will probably switch her to tamoxifen. -She would like to continue Zoladex injection for now, she has not decided to proceed with BSO yet.     PLAN: -lab reviewed - restart Verzenio  -Continue  Exemestane and Zoladex -lab, f/u and injection 03/31/2022 -I will see her in 3 months    SUMMARY OF ONCOLOGIC HISTORY: Oncology History Overview Note   Cancer Staging  Malignant neoplasm of upper-outer quadrant of left breast in female, estrogen receptor positive ([place]) Staging form: Breast, AJCC 8th Edition - Clinical stage from 11/05/2020: Stage IIA (cT2, cN1, cM0, G1, ER+, PR+, HER2: Equivocal) - Signed by [name], MD on 11/10/2020 - Pathologic stage from 01/06/2021: Stage IB (pT3, pN2a, cM0, G2, ER+, PR+, HER2-) - Signed by [name], MD on 01/12/2021     Malignant neoplasm of upper-outer quadrant of left breast in female, estrogen receptor positive ([place])  10/28/2020 Mammogram   Bilateral Diagnostic Mammogram; Left Breast Ultrasound  IMPRESSION: 1. Suspicious palpable mass/hypoechoic area in the left breast at 12 o'clock retroareolar measuring 4.5 cm.   2.  Suspicious lymph node in the left axilla.   11/05/2020 Cancer Staging   Staging form: Breast, AJCC 8th Edition - Clinical stage from 11/05/2020: Stage IIA (cT2, cN1, cM0, G1, ER+,  PR+, HER2: Equivocal) - Signed by [name], MD on 11/10/2020 Stage prefix: Initial diagnosis Histologic grading system: 3 grade system   11/05/2020 Pathology Results    Diagnosis 1. Breast, left, needle core biopsy, 12 o'clock subareolar, ribbon clip - INVASIVE MAMMARY CARCINOMA. SEE NOTE 1. Carcinoma measures 1.5 cm in greatest linear dimension and appears grade 2. 1. Immunohistochemical stain for E-cadherin is positive in the tumor cells, consistent with a ductal phenotype. 1. PROGNOSTIC INDICATORS Results: The tumor cells are EQUIVOCAL for Her2 (2+). Her2 by FISH will be performed and results reported separately. Estrogen Receptor: 70%, POSITIVE, STRONG-MODERATE STAINING INTENSITY Progesterone Receptor: 80%, POSITIVE, MODERATE STAINING INTENSITY Proliferation Marker Ki67: 1%  2. Lymph node, needle/core biopsy, left axilla, tribell clip - METASTATIC CARCINOMA TO A LYMPH NODE. SEE NOTE 2. Largest contiguous focus of metastatic carcinoma measures 0.3 cm. 2. PROGNOSTIC INDICATORS Results: The tumor cells are EQUIVOCAL for Her2 (2+). Her2 by FISH will be performed and results reported separately. Estrogen Receptor: 90%, POSITIVE, STRONG STAINING INTENSITY Progesterone Receptor: 95%, POSITIVE, STRONG STAINING INTENSITY Proliferation Marker Ki67: 1%   11/10/2020 Initial Diagnosis   Malignant neoplasm of upper-outer quadrant of left breast in female, estrogen receptor positive ([place])   11/19/2020 Imaging   EXAM: CT CHEST, ABDOMEN, AND PELVIS WITH CONTRAST  IMPRESSION: 1. Left breast mass  as previously described. 2. Enhancing subcentimeter left axillary lymph nodes. 3. No evidence of distal metastatic disease. 4. Small hiatal hernia   11/19/2020 Imaging   EXAM: NUCLEAR MEDICINE WHOLE BODY BONE SCAN  IMPRESSION: No evidence of metastatic disease.   11/21/2020 Imaging   EXAM: BILATERAL BREAST MRI WITH AND WITHOUT CONTRAST  IMPRESSION: 1. Suspicious 1.3 centimeter mass in the anterior UPPER central RIGHT breast warranting tissue diagnosis. (Image 65 of series 7). 2. 4 millimeter possible satellite nodule posterior to the 1.3 centimeter mass  in the RIGHT breast. (Image 63 of series 7). 3. Indeterminate oval mass in the UPPER central middle depth of the RIGHT breast warranting tissue diagnosis. (Image 60 of series 7). 4. Large area of enhancement in the central portion of the LEFT breast, involving all quadrants. There is associated enhancement and retraction of the LEFT nipple, with significantly smaller size of the LEFT breast. Enhancement spans at least 6.5 centimeters. 5. Enlarged, previously biopsied LEFT axillary lymph node.   11/24/2020 Genetic Testing   Negative hereditary cancer genetic testing: no pathogenic variants detected in [facility] BRCAPlus STAT Panel or [facility] CustomNext-Cancer +RNAinsight Panel.  The report dates are November 24, 2020 and November 27, 2020, respectively.   The BRCAplus panel offered by [employer] and includes sequencing and deletion/duplication analysis for the following 8 genes: ATM, BRCA1, BRCA2, CDH1, CHEK2, PALB2, PTEN, and TP53.  The CustomNext-Cancer+RNAinsight panel offered by [facility] includes sequencing and rearrangement analysis for the following 47 genes:  APC, ATM, AXIN2, BARD1, BMPR1A, BRCA1, BRCA2, BRIP1, CDH1, CDK4, CDKN2A, CHEK2, DICER1, EPCAM, GREM1, HOXB13, MEN1, MLH1, MSH2, MSH3, MSH6, MUTYH, NBN, NF1, NF2, NTHL1, PALB2, PMS2, POLD1, POLE, PTEN, RAD51C, RAD51D, RECQL, RET, SDHA, SDHAF2, SDHB, SDHC, SDHD, SMAD4, SMARCA4, STK11, TP53, TSC1, TSC2, and VHL.  RNA data is routinely analyzed for use in variant interpretation for all genes.    11/27/2020 Pathology Results   Diagnosis Breast, right, needle core biopsy, anterior upper central - FIBROADENOMATOID AND FIBROCYSTIC CHANGES WITH CALCIFICATIONS - PSEUDOANGIOMATOUS STROMAL HYPERPLASIA - FOCAL PERIDUCTULAR CHRONIC INFLAMMATION - NO MALIGNANCY IDENTIFIED   01/06/2021 Cancer Staging   Staging form: Breast, AJCC 8th Edition - Pathologic stage from 01/06/2021:  Stage IB (pT3, pN2a, cM0, G2, ER+, PR+, HER2-) - Signed by [name],  MD on 01/12/2021 Stage prefix: Initial diagnosis Histologic grading system: 3 grade system Residual tumor (R): R0 - None   01/06/2021 Definitive Surgery   FINAL MICROSCOPIC DIAGNOSIS:   A. LYMPH NODE, LEFT AXILLARY, SENTINEL, EXCISION:  - Metastatic carcinoma to a lymph node (1/1)  - Focus of metastatic carcinoma measures 1.5 cm without evidence of extranodal extension   B. 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 BREAST, LEFT INFERIOLATERAL MARGIN, EXCISION:  - Benign fibroadipose tissue  - Negative for carcinoma   D. LYMPH NODE, LEFT AXILLARY #1, SENTINEL, EXCISION:  - Metastatic carcinoma to a lymph node (1/1)  - Focus of metastatic carcinoma measures 1.4 cm without evidence of extranodal extension   E. LYMPH NODE, LEFT AXILLARY #2, SENTINEL, EXCISION:  - Metastatic carcinoma to a lymph node (1/1)  - Focus of metastatic carcinoma measures 0.5 cm without evidence of extranodal extension   F. LYMPH NODE, LEFT AXILLARY #3, SENTINEL, EXCISION:  - Metastatic carcinoma to a lymph node (1/1)  - Focus of metastatic carcinoma measures 0.9 cm without evidence of extranodal extension   G. LYMPH NODE, LEFT AXILLARY #4, SENTINEL, EXCISION:  - Benign fibroadipose tissue, negative for carcinoma  - Lymphoid tissue is not identified    02/11/2021 - 06/24/2021 Chemotherapy   Patient is on Treatment Plan : BREAST ADJUVANT DOSE DENSE AC q14d / PACLitaxel q7d     06/16/2021 PET scan   IMPRESSION: 1. Postsurgical change of left mastectomy and left axillary lymph node dissection with tissue expander in place. Mild hypermetabolic activity about the surgical site without suspicious focal hypermetabolic soft tissue nodularity, favored to reflect postsurgical change.   2. No convincing scintigraphic  evidence of hypermetabolic local recurrence or metastatic disease within the neck, chest, abdomen or pelvis.      INTERVAL HISTORY:  Pamela Reyes is here for a follow up of left breast cancer  She was last seen by NP Lacie on 02/04/2023 She presents to the clinic alone. Pt states surgery went well. Pt reports UTI is still a problem. Pt states she has some joint pain and she takes Tylenol for pain.     All other systems were reviewed with the patient and are negative.  MEDICAL HISTORY:  Past Medical History:  Diagnosis Date   Anxiety    Axillary pain, right 01/18/2021   Will get right axillary ultrasound.   Breast cancer ([place])    Breast pain, left 10/06/2020   Encounter for gynecological examination with Papanicolaou smear of cervix 02/10/2021   Encounter for screening fecal occult blood testing 02/10/2021   Family history of breast cancer 11/11/2020   Fibroid 10/27/2020   Has 1 x .9 x .6 cm subserosal fibroid   H/O left mastectomy    left mastectomy 2022   History of hiatal hernia    Left breast mass 10/06/2020   +breast cancer   LLQ pain 10/06/2020   PONV (postoperative nausea and vomiting)    Port-A-Cath in place 03/25/2021    SURGICAL HISTORY: Past Surgical History:  Procedure Laterality Date   AXILLARY LYMPH NODE DISSECTION Left 07/07/2021   Procedure: LEFT AXILLARY LYMPH NODE DISSECTION;  Surgeon: Stark Klein, MD;  Location: [facility];  Service: General;  Laterality: Left;   BREAST RECONSTRUCTION WITH PLACEMENT OF TISSUE EXPANDER AND FLEX HD (ACELLULAR HYDRATED DERMIS) Left 01/06/2021   Procedure: LEFT BREAST RECONSTRUCTION WITH PLACEMENT OF TISSUE EXPANDER AND ACELLULAR  DERMIS;  Surgeon: Irene Limbo, MD;  Location: [facility];  Service: Plastics;  Laterality: Left;   MASTECTOMY W/ SENTINEL NODE BIOPSY Left 01/06/2021   Procedure: LEFT MASTECTOMY WITH MAGTRACE;  Surgeon: Stark Klein, MD;  Location: [facility];  Service: General;  Laterality: Left;    PORT-A-CATH REMOVAL Right 07/07/2021   Procedure: REMOVAL PORT-A-CATH;  Surgeon: Stark Klein, MD;  Location: [facility];  Service: General;  Laterality: Right;   PORTACATH PLACEMENT Right 01/19/2021   Procedure: INSERTION PORT-A-CATH;  Surgeon: Stark Klein, MD;  Location: [facility];  Service: General;  Laterality: Right;   RADIOACTIVE SEED GUIDED AXILLARY SENTINEL LYMPH NODE Left 01/06/2021   Procedure: RADIOACTIVE SEED GUIDED SENTINEL LYMPH NODE BIOPSY;  Surgeon: Stark Klein, MD;  Location: [facility];  Service: General;  Laterality: Left;   REMOVAL OF TISSUE EXPANDER AND PLACEMENT OF IMPLANT Left 02/18/2022   Procedure: REMOVAL OF TISSUE EXPANDER AND PLACEMENT OF IMPLANT;  Surgeon: Irene Limbo, MD;  Location: [facility];  Service: Plastics;  Laterality: Left;   SENTINEL NODE BIOPSY Left 01/06/2021   Procedure: LEFT AXILLARY SENTINEL LYMPH  NODE BIOPSY;  Surgeon: Stark Klein, MD;  Location: [facility];  Service: General;  Laterality: Left;   WISDOM TOOTH EXTRACTION      I have reviewed the social history and family history with the patient and they are unchanged from previous note.  ALLERGIES:  is allergic to prochlorperazine.  MEDICATIONS:  Current Outpatient Medications  Medication Sig Dispense Refill   acetaminophen (TYLENOL) 500 MG tablet Take 500-1,000 mg by mouth every 6 (six) hours as needed for moderate pain or headache.     Biotin 1 MG CAPS Take by mouth.     Cholecalciferol (DIALYVITE VITAMIN D 5000) 125 MCG (5000 UT) capsule Take 5,000 Units by mouth daily. Vit D and K supplement combined     exemestane (AROMASIN) 25 MG tablet Take 1 tablet by mouth daily after breakfast. 90 tablet 0   GOSERELIN ACETATE [place]      hydrOXYzine (ATARAX) 10 MG tablet TAKE 1 TABLET (10 MG TOTAL) BY MOUTH 3 (THREE) TIMES DAILY AS NEEDED FOR ANXIETY. START WITH 1 PILL BID FOR 1 WEEK. IF NO CHANGE IN CHEST TIGHTNESS STOP AND CALL THE OFFICE. 90 tablet 1   loratadine  (CLARITIN) 10 MG tablet Take 10 mg by mouth daily.     nitrofurantoin (MACRODANTIN) 50 MG capsule Take 1 capsule (50 mg total) by mouth as directed. Take 1 hour prior or immediately after sexual intercourse. 20 capsule 2   omeprazole (PRILOSEC) 20 MG capsule Take 20 mg by mouth daily.     phenazopyridine (PYRIDIUM) 200 MG tablet Take 1 tablet (200 mg total) by mouth 3 (three) times daily as needed for pain. 20 tablet 2   tretinoin (RETIN-A) 0.025 % cream Apply 1 application. topically at bedtime.     UNABLE TO FIND Med Name: [place] [employer]/ [place] TO FIND Med Name: [place] [place]     VERZENIO 150 MG tablet TAKE 1 TABLET BY MOUTH 2 TIMES A DAY 56 tablet 0   No current facility-administered medications for this visit.    PHYSICAL EXAMINATION: ECOG PERFORMANCE STATUS: 0 - Asymptomatic  Vitals:   03/03/22 7619  BP: 136/69  Pulse: 75  Resp: 18  Temp: 98.2 F (36.8 C)  SpO2: 100%   Wt Readings from Last 3 Encounters:  03/03/22 132 lb 14.4 oz (60.3 kg)  02/18/22 130 lb 8.2 oz (59.2 kg)  02/03/22 129 lb (58.5 kg)     GENERAL:alert, no distress and comfortable SKIN: skin color normal, no rashes or significant lesions EYES: normal, Conjunctiva are pink and non-injected, sclera clear  NEURO: alert & oriented x 3 with fluent speech NECK: (-)supple, thyroid normal size, non-tender, without nodularity LYMPH:  (-)  no palpable lymphadenopathy in the cervical, axillary  ABDOMEN(-) :abdomen soft, non-tender and normal bowel sounds BREAST:LT Breast Reconstruction Surgery no palpable mass, Breast exam benign.     LABORATORY DATA:  I have reviewed the data as listed    Latest Ref Rng & Units 03/03/2022    8:02 AM 02/16/2022    9:49 AM 02/03/2022    8:48 AM  CBC  WBC 4.0 - 10.5 K/uL 3.0  2.6  2.1   Hemoglobin 12.0 - 15.0 g/dL 13.2  13.9  13.1   Hematocrit 36.0 - 46.0 % 37.2  38.8  36.9   Platelets 150 - 400 K/uL 245  247  203         Latest Ref Rng &  Units 03/03/2022    8:02 AM 02/03/2022    8:48 AM 01/06/2022    8:05 AM  CMP  Glucose 70 - 99 mg/dL 90  91  100   BUN 6 - 20 mg/dL '21  13  23   '$ Creatinine 0.44 - 1.00 mg/dL 0.92  0.85  0.94   Sodium 135 - 145 mmol/L 141  141  139   Potassium 3.5 - 5.1 mmol/L 4.2  4.7  4.7   Chloride 98 - 111 mmol/L 104  105  104   CO2 22 - 32 mmol/L '31  30  29   '$ Calcium 8.9 - 10.3 mg/dL 10.0  10.1  10.6   Total Protein 6.5 - 8.1 g/dL 7.2  7.1  7.8   Total Bilirubin 0.3 - 1.2 mg/dL 0.4  0.5  0.5   Alkaline Phos 38 - 126 U/L 49  43  42   AST 15 - 41 U/L '27  22  17   '$ ALT 0 - 44 U/L 34  27  17       RADIOGRAPHIC STUDIES: I have personally reviewed the radiological images as listed and agreed with the findings in the report. No results found.    No orders of the defined types were placed in this encounter.  All questions were answered. The patient knows to call the clinic with any problems, questions or concerns. No barriers to learning was detected. The total time spent in the appointment was 30 minutes.     [name], MD 03/03/2022   Felicity Coyer, CMA, am acting as scribe for [name], MD.   I have reviewed the above documentation for accuracy and completeness, and I agree with the above.

## 2022-03-02 NOTE — Assessment & Plan Note (Addendum)
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 Biopsy on 11/05/20 confirmed invasive ductal carcinoma, with metastatic involvement of one lymph node. -staging CT CAP and bone scan on 11/19/20 showed no distant metastasis -she underwent left mastectomy on 01/06/21 under Dr. Barry Dienes. Pathology showed 6.5 cm invasive ductal carcinoma, grade 2, involving dermis of nipple without epidermal involvement. Margins were negative, but all 4 lymph nodes showed metastatic disease. -s/p 4 cycles AC 02/11/21 - 03/25/21 and Abraxane 04/08/21 - 06/24/21. -PET scan on 06/16/21 showed no convincing evidence of local recurrence or metastatic disease. -left ALND on 07/07/21 with Dr. Barry Dienes, path showed 1/11 positive nodes -s/p postmastectomy radiation by Dr. Lisbeth Renshaw 08/16/21 - 10/04/21. -she started Zoladex in 07/07/21 and letrozole on 07/21/21. She developed worsening joint pain and was switched to exemestane in early 10/2021.  -she started Verzenio '100mg'$  BID on 10/28/21, dose increased to '150mg'$  bid later. She had UTI, fatigue etc, but overall tolerating better now -she had reconsult surgery on February 18, 2022, Verzenio was held before surgery.

## 2022-03-03 ENCOUNTER — Inpatient Hospital Stay: Payer: BC Managed Care – PPO | Attending: Hematology

## 2022-03-03 ENCOUNTER — Encounter: Payer: Self-pay | Admitting: Hematology

## 2022-03-03 ENCOUNTER — Inpatient Hospital Stay (HOSPITAL_BASED_OUTPATIENT_CLINIC_OR_DEPARTMENT_OTHER): Payer: BC Managed Care – PPO | Admitting: Hematology

## 2022-03-03 ENCOUNTER — Inpatient Hospital Stay: Payer: BC Managed Care – PPO

## 2022-03-03 VITALS — BP 136/69 | HR 75 | Temp 98.2°F | Resp 18 | Ht 64.0 in | Wt 132.9 lb

## 2022-03-03 DIAGNOSIS — Z79811 Long term (current) use of aromatase inhibitors: Secondary | ICD-10-CM | POA: Diagnosis not present

## 2022-03-03 DIAGNOSIS — Z9012 Acquired absence of left breast and nipple: Secondary | ICD-10-CM | POA: Insufficient documentation

## 2022-03-03 DIAGNOSIS — C50412 Malignant neoplasm of upper-outer quadrant of left female breast: Secondary | ICD-10-CM | POA: Diagnosis not present

## 2022-03-03 DIAGNOSIS — Z5111 Encounter for antineoplastic chemotherapy: Secondary | ICD-10-CM | POA: Diagnosis not present

## 2022-03-03 DIAGNOSIS — Z170 Estrogen receptor positive status [ER+]: Secondary | ICD-10-CM

## 2022-03-03 DIAGNOSIS — Z95828 Presence of other vascular implants and grafts: Secondary | ICD-10-CM

## 2022-03-03 DIAGNOSIS — Z923 Personal history of irradiation: Secondary | ICD-10-CM | POA: Insufficient documentation

## 2022-03-03 LAB — CBC WITH DIFFERENTIAL (CANCER CENTER ONLY)
Abs Immature Granulocytes: 0.01 10*3/uL (ref 0.00–0.07)
Basophils Absolute: 0 10*3/uL (ref 0.0–0.1)
Basophils Relative: 1 %
Eosinophils Absolute: 0 10*3/uL (ref 0.0–0.5)
Eosinophils Relative: 1 %
HCT: 37.2 % (ref 36.0–46.0)
Hemoglobin: 13.2 g/dL (ref 12.0–15.0)
Immature Granulocytes: 0 %
Lymphocytes Relative: 31 %
Lymphs Abs: 0.9 10*3/uL (ref 0.7–4.0)
MCH: 33.1 pg (ref 26.0–34.0)
MCHC: 35.5 g/dL (ref 30.0–36.0)
MCV: 93.2 fL (ref 80.0–100.0)
Monocytes Absolute: 0.3 10*3/uL (ref 0.1–1.0)
Monocytes Relative: 9 %
Neutro Abs: 1.7 10*3/uL (ref 1.7–7.7)
Neutrophils Relative %: 58 %
Platelet Count: 245 10*3/uL (ref 150–400)
RBC: 3.99 MIL/uL (ref 3.87–5.11)
RDW: 12.1 % (ref 11.5–15.5)
WBC Count: 3 10*3/uL — ABNORMAL LOW (ref 4.0–10.5)
nRBC: 0 % (ref 0.0–0.2)

## 2022-03-03 LAB — CMP (CANCER CENTER ONLY)
ALT: 34 U/L (ref 0–44)
AST: 27 U/L (ref 15–41)
Albumin: 4.3 g/dL (ref 3.5–5.0)
Alkaline Phosphatase: 49 U/L (ref 38–126)
Anion gap: 6 (ref 5–15)
BUN: 21 mg/dL — ABNORMAL HIGH (ref 6–20)
CO2: 31 mmol/L (ref 22–32)
Calcium: 10 mg/dL (ref 8.9–10.3)
Chloride: 104 mmol/L (ref 98–111)
Creatinine: 0.92 mg/dL (ref 0.44–1.00)
GFR, Estimated: >60 mL/min (ref >60)
Glucose, Bld: 90 mg/dL (ref 70–99)
Potassium: 4.2 mmol/L (ref 3.5–5.1)
Sodium: 141 mmol/L (ref 135–145)
Total Bilirubin: 0.4 mg/dL (ref 0.3–1.2)
Total Protein: 7.2 g/dL (ref 6.5–8.1)

## 2022-03-03 MED ORDER — GOSERELIN ACETATE 3.6 MG ~~LOC~~ IMPL
3.6000 mg | DRUG_IMPLANT | Freq: Once | SUBCUTANEOUS | Status: AC
  Administered 2022-03-03: 3.6 mg via SUBCUTANEOUS
  Filled 2022-03-03: qty 3.6

## 2022-03-04 ENCOUNTER — Other Ambulatory Visit: Payer: Self-pay | Admitting: Hematology

## 2022-03-04 ENCOUNTER — Telehealth: Payer: Self-pay | Admitting: Hematology

## 2022-03-04 LAB — CANCER ANTIGEN 27.29: CA 27.29: 15.8 U/mL (ref 0.0–38.6)

## 2022-03-04 NOTE — Telephone Encounter (Signed)
Spoke with patient confirming all upcoming appointments  

## 2022-03-08 ENCOUNTER — Other Ambulatory Visit: Payer: Self-pay | Admitting: Family

## 2022-03-08 DIAGNOSIS — R072 Precordial pain: Secondary | ICD-10-CM

## 2022-03-23 ENCOUNTER — Other Ambulatory Visit: Payer: Self-pay | Admitting: Family

## 2022-03-23 DIAGNOSIS — R072 Precordial pain: Secondary | ICD-10-CM

## 2022-03-25 ENCOUNTER — Encounter: Payer: Self-pay | Admitting: Obstetrics and Gynecology

## 2022-03-25 ENCOUNTER — Ambulatory Visit (INDEPENDENT_AMBULATORY_CARE_PROVIDER_SITE_OTHER): Payer: BC Managed Care – PPO | Admitting: Obstetrics and Gynecology

## 2022-03-25 VITALS — BP 112/76 | HR 85 | Ht 64.0 in | Wt 129.0 lb

## 2022-03-25 DIAGNOSIS — N941 Unspecified dyspareunia: Secondary | ICD-10-CM

## 2022-03-25 DIAGNOSIS — R350 Frequency of micturition: Secondary | ICD-10-CM | POA: Diagnosis not present

## 2022-03-25 DIAGNOSIS — R3989 Other symptoms and signs involving the genitourinary system: Secondary | ICD-10-CM | POA: Diagnosis not present

## 2022-03-25 LAB — POCT URINALYSIS DIPSTICK
Bilirubin, UA: NEGATIVE
Blood, UA: NEGATIVE
Glucose, UA: NEGATIVE
Ketones, UA: NEGATIVE
Leukocytes, UA: NEGATIVE
Nitrite, UA: NEGATIVE
Protein, UA: NEGATIVE
Spec Grav, UA: 1.015 (ref 1.010–1.025)
Urobilinogen, UA: 0.2 E.U./dL
pH, UA: 7 (ref 5.0–8.0)

## 2022-03-25 MED ORDER — DIAZEPAM 5 MG PO TABS: ORAL_TABLET | ORAL | 0 refills | Status: AC

## 2022-03-25 MED ORDER — LIDOCAINE 5 % EX OINT: TOPICAL_OINTMENT | CUTANEOUS | 0 refills | Status: DC

## 2022-03-25 NOTE — Progress Notes (Signed)
Urogynecology New Patient Evaluation and Consultation  Referring Provider: Jeanie Sewer, NP PCP: Jeanie Sewer, NP Date of Service: 03/25/2022  SUBJECTIVE Chief Complaint: New Patient (Initial Visit) (Pamela Reyes is a 42 y.o. female here for a consult for recurrent UTI sx. Pt said she also experiences pain during sex. Pt said she pelvic PT at [facility].)  History of Present Illness: Pamela Reyes is a 42 y.o. White or Caucasian female seen in consultation at the request of NP Jeanie Sewer for evaluation of dysuria and dyspareunia.    Review of records significant for: Has had several episodes of UTI symptoms. Symptoms occur after UTI. Tried post coital macrobid and did not see an improvement.   Urinary Symptoms: Does not leak urine.   Day time voids 6-8.  Nocturia: 1-2 times per night to void. Voiding dysfunction: she empties her bladder well.  does not use a catheter to empty bladder.  When urinating, she feels she has no difficulties Drinks: 2 cups coffee, electrolyte drink (flavored with mio), water per day Seen by [facility]- tried Myrbetriq 70m daily. She feels like this did help some but medication was not covered.   UTIs: 2-3 UTI's in the last year.  Symptoms happened after intercourse. Currently she has no symptoms.  Urine culture 10/25/21- negative. She reports that she did not have a culture the first time.  Denies history of blood in urine and kidney or bladder stones Has done pelvic physical therapy (11 visits) She is taking cystomend supplement over the counter daily (contains L-arginine, D-mannose and pumpkin seed oil among other supplements).   Pelvic Organ Prolapse Symptoms:                  She Denies a feeling of a bulge the vaginal area.   Bowel Symptom: Bowel movements: 2-3 time(s) per day Stool consistency: loose Straining: no.  Splinting: no.  Incomplete evacuation: no.  She Denies accidental bowel leakage / fecal  incontinence Bowel regimen: none  Sexual Function Sexually active: yes.  Sexual orientation:  heterosexual Pain with sex: Yes, at the vaginal opening, deep in the pelvis She has stopped having intercourse because of her urinary symptoms.  She is not sure that pelvic PT has helped with this symptoms but has not been consistent about using dilators.  Has a pelvic wand but did not really use it.  Uses good clean love lubricant and coconut oil, and v-magic  Also uses uberlube for intercourse.  She is on an anti-estrogen medication for her breast cancer.   Pelvic Pain Denies pelvic pain    Past Medical History:  Past Medical History:  Diagnosis Date   Anxiety    Axillary pain, right 01/18/2021   Will get right axillary ultrasound.   [place]    Breast pain, left 10/06/2020   Encounter for gynecological examination with Papanicolaou smear of cervix 02/10/2021   Encounter for screening fecal occult blood testing 02/10/2021   Family history of breast cancer 11/11/2020   Fibroid 10/27/2020   Has 1 x .9 x .6 cm subserosal fibroid   H/O left mastectomy    left mastectomy 2022   History of hiatal hernia    Left breast mass 10/06/2020   +breast cancer   LLQ pain 10/06/2020   PONV (postoperative nausea and vomiting)    Port-A-Cath in place 03/25/2021     Past Surgical History:   Past Surgical History:  Procedure Laterality Date   AXILLARY LYMPH NODE DISSECTION Left  07/07/2021   Procedure: LEFT AXILLARY LYMPH NODE DISSECTION;  Surgeon: Stark Klein, MD;  Location: [place];  Service: General;  Laterality: Left;   BREAST RECONSTRUCTION WITH PLACEMENT OF TISSUE EXPANDER AND FLEX HD (ACELLULAR HYDRATED DERMIS) Left 01/06/2021   Procedure: LEFT BREAST RECONSTRUCTION WITH PLACEMENT OF TISSUE EXPANDER AND ACELLULAR  DERMIS;  Surgeon: Irene Limbo, MD;  Location: [place];  Service: Plastics;  Laterality: Left;   MASTECTOMY W/ SENTINEL NODE BIOPSY Left  01/06/2021   Procedure: LEFT MASTECTOMY WITH MAGTRACE;  Surgeon: Stark Klein, MD;  Location: [place];  Service: General;  Laterality: Left;   PORT-A-CATH REMOVAL Right 07/07/2021   Procedure: REMOVAL PORT-A-CATH;  Surgeon: Stark Klein, MD;  Location: [place];  Service: General;  Laterality: Right;   PORTACATH PLACEMENT Right 01/19/2021   Procedure: INSERTION PORT-A-CATH;  Surgeon: Stark Klein, MD;  Location: [place];  Service: General;  Laterality: Right;   RADIOACTIVE SEED GUIDED AXILLARY SENTINEL LYMPH NODE Left 01/06/2021   Procedure: RADIOACTIVE SEED GUIDED SENTINEL LYMPH NODE BIOPSY;  Surgeon: Stark Klein, MD;  Location: [place];  Service: General;  Laterality: Left;   REMOVAL OF TISSUE EXPANDER AND PLACEMENT OF IMPLANT Left 02/18/2022   Procedure: REMOVAL OF TISSUE EXPANDER AND PLACEMENT OF IMPLANT;  Surgeon: Irene Limbo, MD;  Location: [place];  Service: Plastics;  Laterality: Left;   SENTINEL NODE BIOPSY Left 01/06/2021   Procedure: LEFT AXILLARY SENTINEL LYMPH  NODE BIOPSY;  Surgeon: Stark Klein, MD;  Location: [place];  Service: General;  Laterality: Left;   WISDOM TOOTH EXTRACTION       Past OB/GYN History: OB History  Gravida Para Term Preterm AB Living  0 0 0 0 0 0  SAB IAB Ectopic Multiple Live Births  0 0 0 0 0   No LMP recorded. (Menstrual status: Chemotherapy). Last pap smear was 02/2021- negative.     Medications: She has a current medication list which includes the following prescription(s): acetaminophen, biotin, dialyvite vitamin d 5000, diazepam, exemestane, goserelin acetate, hydroxyzine, lidocaine, loratadine, nitrofurantoin, omeprazole, phenazopyridine, tretinoin, UNABLE TO FIND, UNABLE TO FIND, verzenio, and [DISCONTINUED] investigational olanzapine/placebo.   Allergies: Patient is allergic to prochlorperazine.   Social History:  Social History   Tobacco Use   Smoking status: Former    Packs/day: 0.25    Years:  4.00    Total pack years: 1.00    Types: Cigarettes    Quit date: 10/13/2001    Years since quitting: 20.4   Smokeless tobacco: Never   Tobacco comments:    I was a light smoker in my teens  Vaping Use   Vaping Use: Never used  Substance Use Topics   Alcohol use: Not Currently    Alcohol/week: 8.0 standard drinks of alcohol    Types: 8 Shots of liquor per week    Comment: 3-4 times a week for 12 years. Currently not drinking.   Drug use: Never    Relationship status: long-term partner (fiance) She lives with fiance.   She is employed in an office. Regular exercise: No History of abuse: No  Family History:   Family History  Problem Relation Age of Onset   Congestive Heart Failure Father    Alcohol abuse Father    Heart disease Father    Breast cancer Sister 58       ER+   Bladder Cancer Maternal Uncle        smoking hx   Cancer Maternal Uncle  kidney or prostate; dx after 80   Breast cancer Paternal Aunt        dx after 26; x2 paternal aunts   Stroke Maternal Grandmother    Lung cancer Maternal Grandfather        dx after 51; smoking hx   Breast cancer Paternal Grandmother        dx after 50     Review of Systems: Review of Systems  Constitutional:  Negative for fever, malaise/fatigue and weight loss.  Respiratory:  Negative for cough, shortness of breath and wheezing.   Cardiovascular:  Negative for chest pain, palpitations and leg swelling.  Gastrointestinal:  Negative for abdominal pain and blood in stool.  Genitourinary:  Negative for dysuria.  Musculoskeletal:  Negative for myalgias.  Skin:  Negative for rash.  Neurological:  Negative for dizziness and headaches.  Endo/Heme/Allergies:  Does not bruise/bleed easily.       + hot flashes  Psychiatric/Behavioral:  Negative for depression. The patient is not nervous/anxious.      OBJECTIVE Physical Exam: Vitals:   03/25/22 0837  BP: 112/76  Pulse: 85  Weight: 129 lb (58.5 kg)  Height: 5' 4"$   (1.626 m)    Physical Exam Constitutional:      General: She is not in acute distress. Pulmonary:     Effort: Pulmonary effort is normal.  Abdominal:     General: There is no distension.     Palpations: Abdomen is soft.     Tenderness: There is no abdominal tenderness. There is no rebound.  Musculoskeletal:        General: No swelling. Normal range of motion.  Skin:    General: Skin is warm and dry.     Findings: No rash.  Neurological:     Mental Status: She is alert and oriented to person, place, and time.  Psychiatric:        Mood and Affect: Mood normal.        Behavior: Behavior normal.      GU / Detailed Urogynecologic Evaluation:  Pelvic Exam: Normal external female genitalia; Bartholin's and Skene's glands normal in appearance; urethral meatus normal in appearance, no urethral masses or discharge.   Q tip test- negative  CST: negative  Small white linear area at introitus- could be atrophy vs lichen sclerosus. Speculum exam reveals normal vaginal mucosa with atrophy- vaginal mucosa friable and some bleeding present with speculum placement. Cervix normal appearance. Uterus normal single, nontender. Adnexa no mass, fullness, tenderness.     Pelvic floor strength II/V  Pelvic floor musculature: high tension but no pain present.  Right levator non-tender, Right obturator non-tender, Left levator non-tender, Left obturator non-tender  POP-Q:  Deferred- no prolapse noted.    Rectal Exam:  Normal external rectum  Post-Void Residual (PVR) by Bladder Scan: In order to evaluate bladder emptying, we discussed obtaining a postvoid residual and she agreed to this procedure.  Procedure: The ultrasound unit was placed on the patient's abdomen in the suprapubic region after the patient had voided. A PVR of 80 ml was obtained by bladder scan.  Laboratory Results: POC urine: negative   ASSESSMENT AND PLAN Ms. Wertheim is a 42 y.o. with:  1. Bladder pain   2. Urinary  frequency   3. Dyspareunia, female    Bladder pain - we discussed that UTI symptoms without definitive infection is consistent with interstitial cystitis.  - For irritative bladder we reviewed treatment options including altering her diet to avoid irritative beverages and foods  as well as attempting to decrease stress and other exacerbating factors.  We also discussed using pyridium and similar over-the-counter medications for pain relief as needed. We discussed the pentad of medications including Elmiron, Tums, an antihistamine such as Vistaril, amitriptyline, and L-arginine.  We also discussed in-office bladder instillations for pain flares, as well as cystoscopy with hydrodistention in the operating room, which can be both diagnostic and therapeutic. She was also given information on the [place] at https://www.ic-network.com for bladder diet suggestions and patient forums for support. - Currently will continue with l-arginine supplement and encouraged over the counter antihistamine.  - If she has symptoms she should provide urine sample and if negative can try a bladder instillation.   2. Dyspareunia/ atrophy - will contact oncologist abt vaginal estrogen and let us know. Her internal vaginal tissues are very friable and I feel she could benefit from the estrogen.  - continue with vaginal moisturizers - Has some pelvic floor muscle tension, but no pain on exam. Suspect that she has spasm of pelvic floor muscles after intercourse which exacerbates her symptoms. Prescribed valium 19m to use prior to intercourse. Also encouraged continued dilator therapy and pelvic floor massage.  - Also prescribed lidocaine ointment to use at introitus prior to intercourse or dilator use.  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 Will consider biopsy if not improved.   Return 6 months or sooner if needed   MJaquita Folds MD

## 2022-03-25 NOTE — Patient Instructions (Addendum)
Today we talked about ways to manage bladder urgency such as altering your diet to avoid irritative beverages and foods (bladder diet) as well as attempting to decrease stress and other exacerbating factors.     There is a website with helpful information for people with bladder irritation, called the [place] at https://www.ic-network.com. This website has more information about a healthy bladder diet and patient forums for support.  The Most Bothersome Foods* The Least Bothersome Foods*  Coffee - Regular & Decaf Tea - caffeinated Carbonated beverages - cola, non-colas, diet & caffeine-free Alcohols - Beer, Red Wine, White Wine, Champagne Fruits - Grapefruit, Rosalie, Orange, [employer] - Cranberry, Grapefruit, Orange, Pineapple Vegetables - Tomato & Tomato Products Flavor Enhancers - Hot peppers, Spicy foods, Chili, Horseradish, Vinegar, Monosodium glutamate (MSG) Artificial Sweeteners - NutraSweet, Sweet 'N Low, Equal (sweetener), Saccharin Ethnic foods - [place], [place], [place] food Express Scripts - low-fat & whole Fruits - Bananas, Blueberries, Honeydew melon, Pears, Raisins, Watermelon Vegetables - Broccoli, Brussels Sprouts, [place], Carrots, Cauliflower, [place], Cucumber, Mushrooms, Peas, Radishes, Squash, Zucchini, White potatoes, Sweet potatoes & yams Poultry - Chicken, Eggs, [place], [employer] - Beef, Programmer, multimedia, Lamb Seafood - Shrimp, [place] fish, Salmon Grains - Oat, Rice Snacks - Pretzels, Popcorn  *Lissa Morales et al. Diet and its role in interstitial cystitis/bladder pain syndrome (IC/BPS) and comorbid conditions. [place] 2012 Jan 11.     Interstitial Cystitis/ Bladder Pain treatment:  Avoid irriative foods and beverages and identify other triggers Work to decrease stress- meditation, yoga Medications for bladder pain: pyridium (Azo), methenamine, Uribel Medications: amitiptyline, hydroxyzine (anti-histamine), tums, L-arginine, Elmiron  Bladder  instillations for pain flares Cystoscopy with hydrodistention  The IC Network at https://www.ic-network.com is helpful for bladder diet suggestions and forums for support.

## 2022-03-28 ENCOUNTER — Encounter: Payer: Self-pay | Admitting: *Deleted

## 2022-03-30 NOTE — Progress Notes (Unsigned)
Patient Care Team: Jeanie Sewer, NP as PCP - General (Family Medicine) Mauro Kaufmann, RN as Oncology Nurse Navigator Rockwell Germany, RN as Oncology Nurse Navigator Stark Klein, MD as Consulting Physician (General Surgery) [name], MD as Consulting Physician (Hematology) Kyung Rudd, MD as Consulting Physician (Radiation Oncology) Estill Dooms, NP as Nurse Practitioner (Obstetrics and Gynecology) Alla Feeling, NP as Nurse Practitioner (Nurse Practitioner)   CHIEF COMPLAINT: Follow-up left breast cancer  Oncology History Overview Note   Cancer Staging  Malignant neoplasm of upper-outer quadrant of left breast in female, estrogen receptor positive ([place]) Staging form: Breast, AJCC 8th Edition - Clinical stage from 11/05/2020: Stage IIA (cT2, cN1, cM0, G1, ER+, PR+, HER2: Equivocal) - Signed by [name], MD on 11/10/2020 - Pathologic stage from 01/06/2021: Stage IB (pT3, pN2a, cM0, G2, ER+, PR+, HER2-) - Signed by [name], MD on 01/12/2021     Malignant neoplasm of upper-outer quadrant of left breast in female, estrogen receptor positive ([place])  10/28/2020 Mammogram   Bilateral Diagnostic Mammogram; Left Breast Ultrasound  IMPRESSION: 1. Suspicious palpable mass/hypoechoic area in the left breast at 12 o'clock retroareolar measuring 4.5 cm.   2.  Suspicious lymph node in the left axilla.   11/05/2020 Cancer Staging   Staging form: Breast, AJCC 8th Edition - Clinical stage from 11/05/2020: Stage IIA (cT2, cN1, cM0, G1, ER+, PR+, HER2: Equivocal) - Signed by [name], MD on 11/10/2020 Stage prefix: Initial diagnosis Histologic grading system: 3 grade system   11/05/2020 Pathology Results   Diagnosis 1. Breast, left, needle core biopsy, 12 o'clock subareolar, ribbon clip - INVASIVE MAMMARY CARCINOMA. SEE NOTE 1. Carcinoma measures 1.5 cm in greatest linear dimension and appears grade 2. 1. Immunohistochemical stain for E-cadherin is positive in the tumor  cells, consistent with a ductal phenotype. 1. PROGNOSTIC INDICATORS Results: The tumor cells are EQUIVOCAL for Her2 (2+). Her2 by FISH will be performed and results reported separately. Estrogen Receptor: 70%, POSITIVE, STRONG-MODERATE STAINING INTENSITY Progesterone Receptor: 80%, POSITIVE, MODERATE STAINING INTENSITY Proliferation Marker Ki67: 1%  2. Lymph node, needle/core biopsy, left axilla, tribell clip - METASTATIC CARCINOMA TO A LYMPH NODE. SEE NOTE 2. Largest contiguous focus of metastatic carcinoma measures 0.3 cm. 2. PROGNOSTIC INDICATORS Results: The tumor cells are EQUIVOCAL for Her2 (2+). Her2 by FISH will be performed and results reported separately. Estrogen Receptor: 90%, POSITIVE, STRONG STAINING INTENSITY Progesterone Receptor: 95%, POSITIVE, STRONG STAINING INTENSITY Proliferation Marker Ki67: 1%   11/10/2020 Initial Diagnosis   Malignant neoplasm of upper-outer quadrant of left breast in female, estrogen receptor positive ([name])   11/19/2020 Imaging   EXAM: CT CHEST, ABDOMEN, AND PELVIS WITH CONTRAST  IMPRESSION: 1. Left breast mass  as previously described. 2. Enhancing subcentimeter left axillary lymph nodes. 3. No evidence of distal metastatic disease. 4. Small hiatal hernia   11/19/2020 Imaging   EXAM: NUCLEAR MEDICINE WHOLE BODY BONE SCAN  IMPRESSION: No evidence of metastatic disease.   11/21/2020 Imaging   EXAM: BILATERAL BREAST MRI WITH AND WITHOUT CONTRAST  IMPRESSION: 1. Suspicious 1.3 centimeter mass in the anterior UPPER central RIGHT breast warranting tissue diagnosis. (Image 65 of series 7). 2. 4 millimeter possible satellite nodule posterior to the 1.3 centimeter mass in the RIGHT breast. (Image 63 of series 7). 3. Indeterminate oval mass in the UPPER central middle depth of the RIGHT breast warranting tissue diagnosis. (Image 60 of series 7). 4. Large area of enhancement in the central portion of the LEFT breast, involving all  quadrants. There is associated enhancement and retraction of the LEFT nipple, with significantly smaller size of the LEFT breast. Enhancement spans at least 6.5 centimeters. 5. Enlarged, previously biopsied LEFT axillary lymph node.   11/24/2020 Genetic Testing   Negative hereditary cancer genetic testing: no pathogenic variants detected in [facility] BRCAPlus STAT Panel or [facility] CustomNext-Cancer +RNAinsight Panel.  The report dates are November 24, 2020 and November 27, 2020, respectively.   The BRCAplus panel offered by [employer] and includes sequencing and deletion/duplication analysis for the following 8 genes: ATM, BRCA1, BRCA2, CDH1, CHEK2, PALB2, PTEN, and TP53.  The CustomNext-Cancer+RNAinsight panel offered by [facility] includes sequencing and rearrangement analysis for the following 47 genes:  APC, ATM, AXIN2, BARD1, BMPR1A, BRCA1, BRCA2, BRIP1, CDH1, CDK4, CDKN2A, CHEK2, DICER1, EPCAM, GREM1, HOXB13, MEN1, MLH1, MSH2, MSH3, MSH6, MUTYH, NBN, NF1, NF2, NTHL1, PALB2, PMS2, POLD1, POLE, PTEN, RAD51C, RAD51D, RECQL, RET, SDHA, SDHAF2, SDHB, SDHC, SDHD, SMAD4, SMARCA4, STK11, TP53, TSC1, TSC2, and VHL.  RNA data is routinely analyzed for use in variant interpretation for all genes.    11/27/2020 Pathology Results   Diagnosis Breast, right, needle core biopsy, anterior upper central - FIBROADENOMATOID AND FIBROCYSTIC CHANGES WITH CALCIFICATIONS - PSEUDOANGIOMATOUS STROMAL HYPERPLASIA - FOCAL PERIDUCTULAR CHRONIC INFLAMMATION - NO MALIGNANCY IDENTIFIED   01/06/2021 Cancer Staging   Staging form: Breast, AJCC 8th Edition - Pathologic stage from 01/06/2021: Stage IB (pT3, pN2a, cM0, G2, ER+, PR+, HER2-) - Signed by [name], MD on 01/12/2021 Stage prefix: Initial diagnosis Histologic grading system: 3 grade system Residual tumor (R): R0 - None   01/06/2021 Definitive Surgery   FINAL MICROSCOPIC DIAGNOSIS:   A. LYMPH NODE, LEFT AXILLARY, SENTINEL, EXCISION:  - Metastatic carcinoma  to a lymph node (1/1)  - Focus of metastatic carcinoma measures 1.5 cm without evidence of extranodal extension   B. 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 BREAST, LEFT INFERIOLATERAL MARGIN, EXCISION:  - Benign fibroadipose tissue  - Negative for carcinoma   D. LYMPH NODE, LEFT AXILLARY #1, SENTINEL, EXCISION:  - Metastatic carcinoma to a lymph node (1/1)  - Focus of metastatic carcinoma measures 1.4 cm without evidence of extranodal extension   E. LYMPH NODE, LEFT AXILLARY #2, SENTINEL, EXCISION:  - Metastatic carcinoma to a lymph node (1/1)  - Focus of metastatic carcinoma measures 0.5 cm without evidence of extranodal extension   F. LYMPH NODE, LEFT AXILLARY #3, SENTINEL, EXCISION:  - Metastatic carcinoma to a lymph node (1/1)  - Focus of metastatic carcinoma measures 0.9 cm without evidence of extranodal extension   G. LYMPH NODE, LEFT AXILLARY #4, SENTINEL, EXCISION:  - Benign fibroadipose tissue, negative for carcinoma  - Lymphoid tissue is not identified    02/11/2021 - 06/24/2021 Chemotherapy   Patient is on Treatment Plan : BREAST ADJUVANT DOSE DENSE AC q14d / PACLitaxel q7d     06/16/2021 PET scan   IMPRESSION: 1. Postsurgical change of left mastectomy and left axillary lymph node dissection with tissue expander in place. Mild hypermetabolic activity about the surgical site without suspicious focal hypermetabolic soft tissue nodularity, favored to reflect postsurgical change.   2. No convincing scintigraphic evidence of hypermetabolic local recurrence or metastatic disease within the neck, chest, abdomen or pelvis.      CURRENT THERAPY: Verzenio 150 mg  twice daily, monthly Zoladex injections, and daily exemestane.   PENDING Stop zoladex/AI 03/11/22 to switch to tamoxifen  04/2022  INTERVAL HISTORY Pamela Reyes returns for follow-up as scheduled, last seen by Dr. Burr Medico 03/03/2022. She has struggled more over the past month, with joint pain that is unbearable some days and more mild other days. In addition, she saw urogyn who feels she has significant vaginal atrophy and would benefit from vaginal estrogen. Pt still has urinary symptoms and dyspareunia.   Otherwise doing well. Recovered from recent reconstruction surgery. Tolerating verzenio 150 mg BID. Periodic diarrhea is mild, manageable. Some days she has severe fatigue but still functional and tolerable.   ROS  All other systems reviewed and negative   Past Medical History:  Diagnosis Date   Anxiety    Axillary pain, right 01/18/2021   Will get right axillary ultrasound.   Breast cancer ([place])    Breast pain, left 10/06/2020   Encounter for gynecological examination with Papanicolaou smear of cervix 02/10/2021   Encounter for screening fecal occult blood testing 02/10/2021   Family history of breast cancer 11/11/2020   Fibroid 10/27/2020   Has 1 x .9 x .6 cm subserosal fibroid   H/O left mastectomy    left mastectomy 2022   History of hiatal hernia    Left breast mass 10/06/2020   +breast cancer   LLQ pain 10/06/2020   PONV (postoperative nausea and vomiting)    Port-A-Cath in place 03/25/2021     Past Surgical History:  Procedure Laterality Date   AXILLARY LYMPH NODE DISSECTION Left 07/07/2021   Procedure: LEFT AXILLARY LYMPH NODE DISSECTION;  Surgeon: Stark Klein, MD;  Location: [place];  Service: General;  Laterality: Left;   BREAST RECONSTRUCTION WITH PLACEMENT OF TISSUE EXPANDER AND FLEX HD (ACELLULAR HYDRATED DERMIS) Left 01/06/2021   Procedure: LEFT BREAST RECONSTRUCTION WITH PLACEMENT OF TISSUE EXPANDER AND ACELLULAR  DERMIS;  Surgeon: Irene Limbo, MD;  Location: [place];  Service: Plastics;  Laterality: Left;   MASTECTOMY W/ SENTINEL NODE BIOPSY Left 01/06/2021    Procedure: LEFT MASTECTOMY WITH MAGTRACE;  Surgeon: Stark Klein, MD;  Location: [place];  Service: General;  Laterality: Left;   PORT-A-CATH REMOVAL Right 07/07/2021   Procedure: REMOVAL PORT-A-CATH;  Surgeon: Stark Klein, MD;  Location: [place];  Service: General;  Laterality: Right;   PORTACATH PLACEMENT Right 01/19/2021   Procedure: INSERTION PORT-A-CATH;  Surgeon: Stark Klein, MD;  Location: [place];  Service: General;  Laterality: Right;   RADIOACTIVE SEED GUIDED AXILLARY SENTINEL LYMPH NODE Left 01/06/2021   Procedure: RADIOACTIVE SEED GUIDED SENTINEL LYMPH NODE BIOPSY;  Surgeon: Stark Klein, MD;  Location: [place];  Service: General;  Laterality: Left;   REMOVAL OF TISSUE EXPANDER AND PLACEMENT OF IMPLANT Left 02/18/2022   Procedure: REMOVAL OF TISSUE EXPANDER AND PLACEMENT OF IMPLANT;  Surgeon: Irene Limbo, MD;  Location: [place];  Service: Plastics;  Laterality: Left;   SENTINEL NODE BIOPSY Left 01/06/2021   Procedure: LEFT AXILLARY SENTINEL LYMPH  NODE BIOPSY;  Surgeon: Stark Klein, MD;  Location: [place];  Service: General;  Laterality: Left;   WISDOM TOOTH EXTRACTION       Outpatient Encounter Medications as of 03/31/2022  Medication Sig   acetaminophen (TYLENOL) 500 MG tablet Take 500-1,000 mg by mouth every 6 (six) hours as needed for moderate pain or headache.   Biotin 1 MG CAPS Take by mouth.   Cholecalciferol (DIALYVITE VITAMIN D 5000) 125 MCG (5000 UT)  capsule Take 5,000 Units by mouth daily. Vit D and K supplement combined   diazepam (VALIUM) 5 MG tablet Place 1 tablet vaginally nightly as needed for muscle spasm/ pelvic pain.   GOSERELIN ACETATE [place]    hydrOXYzine (ATARAX) 10 MG tablet TAKE 1 TABLET (10 MG TOTAL) BY MOUTH DAILY AS NEEDED FOR ANXIETY, MAY TAKE 2 TIMES A DAY IF NEEDED   lidocaine (XYLOCAINE) 5 % ointment Apply vaginally as needed   loratadine (CLARITIN) 10 MG tablet Take 10 mg by mouth daily.   nitrofurantoin  (MACRODANTIN) 50 MG capsule Take 1 capsule (50 mg total) by mouth as directed. Take 1 hour prior or immediately after sexual intercourse.   omeprazole (PRILOSEC) 20 MG capsule Take 20 mg by mouth daily.   phenazopyridine (PYRIDIUM) 200 MG tablet Take 1 tablet (200 mg total) by mouth 3 (three) times daily as needed for pain.   tamoxifen (NOLVADEX) 20 MG tablet Take 1 tablet (20 mg total) by mouth daily.   tretinoin (RETIN-A) 0.025 % cream Apply 1 application. topically at bedtime.   UNABLE TO FIND Med Name: [place] [employer] [place] TO FIND Med Name: [place]   VERZENIO 150 MG tablet TAKE 1 TABLET BY MOUTH 2 TIMES A DAY   [DISCONTINUED] exemestane (AROMASIN) 25 MG tablet TAKE 1 TABLET BY MOUTH DAILY AFTER BREAKFAST.   [DISCONTINUED] Investigational olanzapine/placebo 10 MG capsule URCC 16070 Blister Card 2 Take 1 capsule by mouth daily. Take in the morning on Days 2-4.   No facility-administered encounter medications on file as of 03/31/2022.     Today's Vitals   03/31/22 0937  BP: 118/70  Pulse: 96  Resp: 17  Temp: 98.1 F (36.7 C)  TempSrc: Oral  SpO2: 100%   There is no height or weight on file to calculate BMI.   PHYSICAL EXAM GENERAL:alert, no distress and comfortable SKIN: no rash  EYES: sclera clear LUNGS: normal breathing effort HEART: no lower extremity edema NEURO: alert & oriented x 3 with fluent speech   CBC    Component Value Date/Time   WBC 2.6 (L) 03/31/2022 0842   WBC 2.6 (L) 02/16/2022 0949   RBC 4.46 03/31/2022 0842   HGB 14.3 03/31/2022 0842   HCT 40.6 03/31/2022 0842   PLT 197 03/31/2022 0842   MCV 91.0 03/31/2022 0842   MCH 32.1 03/31/2022 0842   MCHC 35.2 03/31/2022 0842   RDW 12.0 03/31/2022 0842   LYMPHSABS 1.0 03/31/2022 0842   MONOABS 0.2 03/31/2022 0842   EOSABS 0.0 03/31/2022 0842   BASOSABS 0.0 03/31/2022 0842     CMP     Component Value Date/Time   NA 139 03/31/2022 0842   K 4.8 03/31/2022 0842   CL 102  03/31/2022 0842   CO2 27 03/31/2022 0842   GLUCOSE 93 03/31/2022 0842   BUN 18 03/31/2022 0842   CREATININE 0.88 03/31/2022 0842   CALCIUM 10.5 (H) 03/31/2022 0842   PROT 7.8 03/31/2022 0842   ALBUMIN 4.8 03/31/2022 0842   AST 19 03/31/2022 0842   ALT 17 03/31/2022 0842   ALKPHOS 58 03/31/2022 0842   BILITOT 0.6 03/31/2022 0842   GFRNONAA >60 03/31/2022 0842     ASSESSMENT & PLAN: 42 y.o. female       1. 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 Biopsy on 11/05/20  confirmed invasive ductal carcinoma, with metastatic involvement of one lymph node. -staging CT CAP and bone scan on 11/19/20 showed no distant metastasis -S/p left mastectomy on 01/06/21 under Dr. Barry Dienes. Pathology showed 6.5 cm invasive ductal carcinoma, grade 2, involving dermis of nipple without epidermal involvement. Margins were negative, but all 4 lymph nodes showed metastatic disease. -s/p 4 cycles AC 02/11/21 - 03/25/21 and Abraxane 04/08/21 - 06/24/21. -PET scan on 06/16/21 showed no convincing evidence of local recurrence or metastatic disease. -left ALND on 07/07/21 with Dr. Barry Dienes, path showed 1/11 positive nodes -s/p postmastectomy radiation by Dr. Lisbeth Renshaw 08/16/21 - 10/04/21. -she started Zoladex in 07/07/21 and letrozole on 07/21/21. She developed worsening joint pain and was switched to exemestane in early 10/2021. tolerating better for a while but has developed worsening joint pains. -she started Verzenio 111m BID on 10/28/21, increased to 150 mg BID after the first month. Tolerating. Nausea resolved with switching famotidine to prilosec. She has mild intermittent diarrhea and fatigue that fluctuates -S/p breast reconstruction 02/18/22, recovered -Pamela Reyes stable. Due to worsening joint pain, urinary symptoms, and negative impact on QOL, we  will stop zoladex and AI and change to tamoxifen. Plan to start 04/08/22. She will continue Verzenio BID -She is ok to use vaginal estrogen per urogynecology, I will cc my note to Dr. SWannetta Sender-She will return for lab 3/22, then lab and f/up with Dr. FBurr Medico4/19 as scheduled -Case discussed with Dr. FBurr Medico  2.  UTI symptoms, dyspareunia, vaginal atrophy -She has tried antibiotics, medication, moisturizers, and pelvic PT -Seen by urogynecologist Dr. MSherlene Shamswho felt bladder pain was c/w interstitial cystitis, and dyspareunia/atrophy would benefit from vaginal estrogen -continue f/up  3. Sternal pain, chest pressure, anxiety -Onset few months -She has separate chest pressure which worsens with anxiety.  -EKG normal in clinic 02/03/22.  -Not discussed today  4.  Left chest wall/rib pain:  -Began ~11/2021, unprovoked - There was some tenderness to palpation but no obvious deformity or abnormality.  Nothing on PET scan from earlier this year.   -Xray was unremarkable -CTA 02/17/2022 ordered by Dr. BBarry Dienesshowed healing subacute nondisplaced right anterior fourth and fifth rib fractures, no acute PE -Not discussed today    PLAN: -Labs reviewed, continue verzenio 150 mg BID -Stop zoladex and Exemestane due to urinary/gyn symptoms, worsening joint pain, and negative impact on QOL -Begin tamoxifen 04/08/22 -OK to use vaginal estrogen for atrophy, will cc note to urogynecologist Dr. MRegis Bill-Lab 3/22 as scheduled, then lab and f/up with Dr. FBurr Medico4/19 -Case discussed with Dr. FBurr Medico    All questions were answered. The patient knows to call the clinic with any problems, questions or concerns. No barriers to learning were detected. I spent 25 ,minutes counseling the patient face to face. The total time spent in the appointment was 40 minutes and more than 50% was on counseling, review of test results, and coordination of care.   LCira Rue NP-C 03/31/2022

## 2022-03-31 ENCOUNTER — Encounter: Payer: Self-pay | Admitting: Nurse Practitioner

## 2022-03-31 ENCOUNTER — Inpatient Hospital Stay (HOSPITAL_BASED_OUTPATIENT_CLINIC_OR_DEPARTMENT_OTHER): Payer: BC Managed Care – PPO | Admitting: Nurse Practitioner

## 2022-03-31 ENCOUNTER — Encounter: Payer: Self-pay | Admitting: Obstetrics and Gynecology

## 2022-03-31 ENCOUNTER — Inpatient Hospital Stay: Payer: BC Managed Care – PPO | Attending: Hematology

## 2022-03-31 ENCOUNTER — Inpatient Hospital Stay: Payer: BC Managed Care – PPO

## 2022-03-31 VITALS — BP 118/70 | HR 96 | Temp 98.1°F | Resp 17

## 2022-03-31 DIAGNOSIS — Z9012 Acquired absence of left breast and nipple: Secondary | ICD-10-CM | POA: Insufficient documentation

## 2022-03-31 DIAGNOSIS — Z170 Estrogen receptor positive status [ER+]: Secondary | ICD-10-CM | POA: Insufficient documentation

## 2022-03-31 DIAGNOSIS — C773 Secondary and unspecified malignant neoplasm of axilla and upper limb lymph nodes: Secondary | ICD-10-CM | POA: Diagnosis not present

## 2022-03-31 DIAGNOSIS — N941 Unspecified dyspareunia: Secondary | ICD-10-CM | POA: Insufficient documentation

## 2022-03-31 DIAGNOSIS — Z923 Personal history of irradiation: Secondary | ICD-10-CM | POA: Insufficient documentation

## 2022-03-31 DIAGNOSIS — N952 Postmenopausal atrophic vaginitis: Secondary | ICD-10-CM

## 2022-03-31 DIAGNOSIS — C50412 Malignant neoplasm of upper-outer quadrant of left female breast: Secondary | ICD-10-CM

## 2022-03-31 DIAGNOSIS — R0789 Other chest pain: Secondary | ICD-10-CM | POA: Diagnosis not present

## 2022-03-31 DIAGNOSIS — F419 Anxiety disorder, unspecified: Secondary | ICD-10-CM | POA: Diagnosis not present

## 2022-03-31 DIAGNOSIS — R0781 Pleurodynia: Secondary | ICD-10-CM | POA: Insufficient documentation

## 2022-03-31 DIAGNOSIS — Z79811 Long term (current) use of aromatase inhibitors: Secondary | ICD-10-CM | POA: Diagnosis not present

## 2022-03-31 DIAGNOSIS — Z5111 Encounter for antineoplastic chemotherapy: Secondary | ICD-10-CM | POA: Diagnosis present

## 2022-03-31 LAB — CMP (CANCER CENTER ONLY)
ALT: 17 U/L (ref 0–44)
AST: 19 U/L (ref 15–41)
Albumin: 4.8 g/dL (ref 3.5–5.0)
Alkaline Phosphatase: 58 U/L (ref 38–126)
Anion gap: 10 (ref 5–15)
BUN: 18 mg/dL (ref 6–20)
CO2: 27 mmol/L (ref 22–32)
Calcium: 10.5 mg/dL — ABNORMAL HIGH (ref 8.9–10.3)
Chloride: 102 mmol/L (ref 98–111)
Creatinine: 0.88 mg/dL (ref 0.44–1.00)
GFR, Estimated: >60 mL/min (ref >60)
Glucose, Bld: 93 mg/dL (ref 70–99)
Potassium: 4.8 mmol/L (ref 3.5–5.1)
Sodium: 139 mmol/L (ref 135–145)
Total Bilirubin: 0.6 mg/dL (ref 0.3–1.2)
Total Protein: 7.8 g/dL (ref 6.5–8.1)

## 2022-03-31 LAB — CBC WITH DIFFERENTIAL (CANCER CENTER ONLY)
Abs Immature Granulocytes: 0.01 10*3/uL (ref 0.00–0.07)
Basophils Absolute: 0 10*3/uL (ref 0.0–0.1)
Basophils Relative: 1 %
Eosinophils Absolute: 0 10*3/uL (ref 0.0–0.5)
Eosinophils Relative: 1 %
HCT: 40.6 % (ref 36.0–46.0)
Hemoglobin: 14.3 g/dL (ref 12.0–15.0)
Immature Granulocytes: 0 %
Lymphocytes Relative: 40 %
Lymphs Abs: 1 10*3/uL (ref 0.7–4.0)
MCH: 32.1 pg (ref 26.0–34.0)
MCHC: 35.2 g/dL (ref 30.0–36.0)
MCV: 91 fL (ref 80.0–100.0)
Monocytes Absolute: 0.2 10*3/uL (ref 0.1–1.0)
Monocytes Relative: 7 %
Neutro Abs: 1.3 10*3/uL — ABNORMAL LOW (ref 1.7–7.7)
Neutrophils Relative %: 51 %
Platelet Count: 197 10*3/uL (ref 150–400)
RBC: 4.46 MIL/uL (ref 3.87–5.11)
RDW: 12 % (ref 11.5–15.5)
WBC Count: 2.6 10*3/uL — ABNORMAL LOW (ref 4.0–10.5)
nRBC: 0 % (ref 0.0–0.2)

## 2022-03-31 MED ORDER — TAMOXIFEN CITRATE 20 MG PO TABS: 20.0000 mg | ORAL_TABLET | Freq: Every day | ORAL | 3 refills | Status: DC

## 2022-03-31 MED ORDER — ESTRADIOL 0.1 MG/GM VA CREA: TOPICAL_CREAM | VAGINAL | 11 refills | Status: DC

## 2022-04-18 ENCOUNTER — Encounter: Payer: Self-pay | Admitting: Hematology

## 2022-04-18 ENCOUNTER — Ambulatory Visit: Payer: BC Managed Care – PPO | Attending: Hematology

## 2022-04-18 VITALS — Wt 125.4 lb

## 2022-04-18 DIAGNOSIS — Z483 Aftercare following surgery for neoplasm: Secondary | ICD-10-CM | POA: Insufficient documentation

## 2022-04-18 NOTE — Therapy (Signed)
OUTPATIENT PHYSICAL THERAPY [facility] SCREENING NOTE   Patient Name: Pamela Reyes MRN: [id number] DOB:14-Jan-1981, 42 y.o., female Today's Date: 04/18/2022  PCP: Jeanie Sewer, NP REFERRING PROVIDER: [name], MD   PT End of Session - 04/18/22 1002     Visit Number 6   # unchanged due to screen only   PT Start Time 0959    PT Stop Time 1004    PT Time Calculation (min) 5 min    Activity Tolerance Patient tolerated treatment well    Behavior During Therapy [facility] for tasks assessed/performed             Past Medical History:  Diagnosis Date   Anxiety    Axillary pain, right 01/18/2021   Will get right axillary ultrasound.   Breast cancer ([place])    Breast pain, left 10/06/2020   Encounter for gynecological examination with Papanicolaou smear of cervix 02/10/2021   Encounter for screening fecal occult blood testing 02/10/2021   Family history of breast cancer 11/11/2020   Fibroid 10/27/2020   Has 1 x .9 x .6 cm subserosal fibroid   H/O left mastectomy    left mastectomy 2022   History of hiatal hernia    Left breast mass 10/06/2020   +breast cancer   LLQ pain 10/06/2020   PONV (postoperative nausea and vomiting)    Port-A-Cath in place 03/25/2021   Past Surgical History:  Procedure Laterality Date   AXILLARY LYMPH NODE DISSECTION Left 07/07/2021   Procedure: LEFT AXILLARY LYMPH NODE DISSECTION;  Surgeon: Stark Klein, MD;  Location: [place];  Service: General;  Laterality: Left;   BREAST RECONSTRUCTION WITH PLACEMENT OF TISSUE EXPANDER AND FLEX HD (ACELLULAR HYDRATED DERMIS) Left 01/06/2021   Procedure: LEFT BREAST RECONSTRUCTION WITH PLACEMENT OF TISSUE EXPANDER AND ACELLULAR  DERMIS;  Surgeon: Irene Limbo, MD;  Location: [place];  Service: Plastics;  Laterality: Left;   MASTECTOMY W/ SENTINEL NODE BIOPSY Left 01/06/2021   Procedure: LEFT MASTECTOMY WITH MAGTRACE;  Surgeon: Stark Klein, MD;  Location: [place];  Service: General;  Laterality:  Left;   PORT-A-CATH REMOVAL Right 07/07/2021   Procedure: REMOVAL PORT-A-CATH;  Surgeon: Stark Klein, MD;  Location: [place];  Service: General;  Laterality: Right;   PORTACATH PLACEMENT Right 01/19/2021   Procedure: INSERTION PORT-A-CATH;  Surgeon: Stark Klein, MD;  Location: [place];  Service: General;  Laterality: Right;   RADIOACTIVE SEED GUIDED AXILLARY SENTINEL LYMPH NODE Left 01/06/2021   Procedure: RADIOACTIVE SEED GUIDED SENTINEL LYMPH NODE BIOPSY;  Surgeon: Stark Klein, MD;  Location: [place];  Service: General;  Laterality: Left;   REMOVAL OF TISSUE EXPANDER AND PLACEMENT OF IMPLANT Left 02/18/2022   Procedure: REMOVAL OF TISSUE EXPANDER AND PLACEMENT OF IMPLANT;  Surgeon: Irene Limbo, MD;  Location: [place];  Service: Plastics;  Laterality: Left;   SENTINEL NODE BIOPSY Left 01/06/2021   Procedure: LEFT AXILLARY SENTINEL LYMPH  NODE BIOPSY;  Surgeon: Stark Klein, MD;  Location: [place];  Service: General;  Laterality: Left;   [place] EXTRACTION     Patient Active Problem List   Diagnosis Date Noted   Burning with urination 10/25/2021   Urinary frequency 10/25/2021   Urgency of urination 10/25/2021   Left flank pain, chronic 09/27/2021   Paresthesia 06/08/2021   Port-A-Cath in place 03/25/2021   Genetic testing 11/25/2020   Family history of breast cancer 11/11/2020   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 lymph node, left ([place]) 11/11/2020   Dense breasts  11/11/2020   Malignant neoplasm of upper-outer quadrant of left breast in female, estrogen receptor positive ([place]) 11/10/2020   Fibroid 10/27/2020    REFERRING DIAG: left breast cancer at risk for lymphedema  THERAPY DIAG: Aftercare following surgery for neoplasm  PERTINENT HISTORY: Diagnosed 10/29/2020 and found by self exam. Biopsy revealed IDC ER, PR +, HER 2 -.  Pt underwent left mastectomy on 01/06/2021 with expander. 4+/5 LN. Chemotherapy completed ACP, Lymph node  dissection on 07/07/21 with 1/11 lymph nodes positive. Completed radiation at end of August 2023   PRECAUTIONS: left UE Lymphedema risk, None  SUBJECTIVE: Pt returns for her 3 month L-dex screen.   PAIN:  Are you having pain? No  SOZO SCREENING: Patient was assessed today using the SOZO machine to determine the lymphedema index score. This was compared to her baseline score. It was determined that she is within the recommended range when compared to her baseline and no further action is needed at this time. She will continue SOZO screenings. These are done every 3 months for 2 years post operatively followed by every 6 months for 2 years, and then annually.   L-DEX FLOWSHEETS - 04/18/22 1000       L-DEX LYMPHEDEMA SCREENING   Measurement Type Unilateral    L-DEX MEASUREMENT EXTREMITY Upper Extremity    POSITION  Standing    DOMINANT SIDE Right    At Risk Side Left    BASELINE SCORE (UNILATERAL) -0.2    L-DEX SCORE (UNILATERAL) 2.7    VALUE CHANGE (UNILAT) 2.9               Otelia Limes, PTA 04/18/2022, 10:05 AM

## 2022-04-21 ENCOUNTER — Other Ambulatory Visit: Payer: Self-pay | Admitting: Hematology

## 2022-04-26 ENCOUNTER — Other Ambulatory Visit (HOSPITAL_COMMUNITY)
Admission: RE | Admit: 2022-04-26 | Discharge: 2022-04-26 | Disposition: A | Discharge status: Home / Self Care | Payer: BC Managed Care – PPO | Source: Ambulatory Visit | Attending: Obstetrics & Gynecology | Admitting: Obstetrics & Gynecology

## 2022-04-26 ENCOUNTER — Other Ambulatory Visit (INDEPENDENT_AMBULATORY_CARE_PROVIDER_SITE_OTHER): Payer: BC Managed Care – PPO | Admitting: *Deleted

## 2022-04-26 DIAGNOSIS — N898 Other specified noninflammatory disorders of vagina: Secondary | ICD-10-CM | POA: Diagnosis present

## 2022-04-26 DIAGNOSIS — L292 Pruritus vulvae: Secondary | ICD-10-CM | POA: Insufficient documentation

## 2022-04-26 NOTE — Progress Notes (Signed)
   NURSE VISIT- VAGINITIS/STD/POC  SUBJECTIVE:  Pamela Reyes is a 42 y.o. G0P0000 GYN patientfemale here for a vaginal swab for vaginitis screening.  She reports the following symptoms: vulvar itching for several days. Denies abnormal vaginal bleeding, significant pelvic pain, fever, or UTI symptoms.  OBJECTIVE:  There were no vitals taken for this visit.  Appears well, in no apparent distress  ASSESSMENT: Vaginal swab for vaginitis screening  PLAN: Self-collected vaginal probe for Bacterial Vaginosis, Yeast sent to lab Treatment: to be determined once results are received Follow-up as needed if symptoms persist/worsen, or new symptoms develop  Janece Canterbury  04/26/2022 4:36 PM

## 2022-04-28 LAB — CERVICOVAGINAL ANCILLARY ONLY
Bacterial Vaginitis (gardnerella): NEGATIVE
Candida Glabrata: NEGATIVE
Candida Vaginitis: NEGATIVE
Comment: NEGATIVE
Comment: NEGATIVE
Comment: NEGATIVE

## 2022-04-29 ENCOUNTER — Other Ambulatory Visit: Payer: Self-pay

## 2022-04-29 ENCOUNTER — Inpatient Hospital Stay: Payer: BC Managed Care – PPO | Attending: Hematology

## 2022-04-29 ENCOUNTER — Inpatient Hospital Stay: Payer: BC Managed Care – PPO

## 2022-04-29 DIAGNOSIS — C50412 Malignant neoplasm of upper-outer quadrant of left female breast: Secondary | ICD-10-CM | POA: Diagnosis present

## 2022-04-29 DIAGNOSIS — Z170 Estrogen receptor positive status [ER+]: Secondary | ICD-10-CM | POA: Diagnosis not present

## 2022-04-29 DIAGNOSIS — C773 Secondary and unspecified malignant neoplasm of axilla and upper limb lymph nodes: Secondary | ICD-10-CM | POA: Insufficient documentation

## 2022-04-29 DIAGNOSIS — Z79810 Long term (current) use of selective estrogen receptor modulators (SERMs): Secondary | ICD-10-CM | POA: Insufficient documentation

## 2022-04-29 LAB — CBC WITH DIFFERENTIAL (CANCER CENTER ONLY)
Abs Immature Granulocytes: 0.01 10*3/uL (ref 0.00–0.07)
Basophils Absolute: 0 10*3/uL (ref 0.0–0.1)
Basophils Relative: 1 %
Eosinophils Absolute: 0 10*3/uL (ref 0.0–0.5)
Eosinophils Relative: 0 %
HCT: 37 % (ref 36.0–46.0)
Hemoglobin: 13.1 g/dL (ref 12.0–15.0)
Immature Granulocytes: 0 %
Lymphocytes Relative: 37 %
Lymphs Abs: 1.1 10*3/uL (ref 0.7–4.0)
MCH: 32.3 pg (ref 26.0–34.0)
MCHC: 35.4 g/dL (ref 30.0–36.0)
MCV: 91.1 fL (ref 80.0–100.0)
Monocytes Absolute: 0.2 10*3/uL (ref 0.1–1.0)
Monocytes Relative: 8 %
Neutro Abs: 1.7 10*3/uL (ref 1.7–7.7)
Neutrophils Relative %: 54 %
Platelet Count: 187 10*3/uL (ref 150–400)
RBC: 4.06 MIL/uL (ref 3.87–5.11)
RDW: 13.2 % (ref 11.5–15.5)
WBC Count: 3.1 10*3/uL — ABNORMAL LOW (ref 4.0–10.5)
nRBC: 0 % (ref 0.0–0.2)

## 2022-04-29 LAB — CMP (CANCER CENTER ONLY)
ALT: 13 U/L (ref 0–44)
AST: 19 U/L (ref 15–41)
Albumin: 4.6 g/dL (ref 3.5–5.0)
Alkaline Phosphatase: 48 U/L (ref 38–126)
Anion gap: 8 (ref 5–15)
BUN: 16 mg/dL (ref 6–20)
CO2: 28 mmol/L (ref 22–32)
Calcium: 10 mg/dL (ref 8.9–10.3)
Chloride: 104 mmol/L (ref 98–111)
Creatinine: 0.92 mg/dL (ref 0.44–1.00)
GFR, Estimated: >60 mL/min (ref >60)
Glucose, Bld: 79 mg/dL (ref 70–99)
Potassium: 4 mmol/L (ref 3.5–5.1)
Sodium: 140 mmol/L (ref 135–145)
Total Bilirubin: 0.5 mg/dL (ref 0.3–1.2)
Total Protein: 7.1 g/dL (ref 6.5–8.1)

## 2022-05-07 LAB — MISC LABCORP TEST (SEND OUT): Labcorp test code: 512150

## 2022-05-17 ENCOUNTER — Other Ambulatory Visit: Payer: Self-pay | Admitting: Hematology

## 2022-05-27 ENCOUNTER — Inpatient Hospital Stay: Payer: BC Managed Care – PPO

## 2022-05-27 ENCOUNTER — Inpatient Hospital Stay: Payer: BC Managed Care – PPO | Attending: Hematology | Admitting: Hematology

## 2022-05-27 ENCOUNTER — Encounter: Payer: Self-pay | Admitting: Hematology

## 2022-05-27 VITALS — BP 117/63 | HR 100 | Temp 98.4°F | Resp 18 | Ht 64.0 in | Wt 124.2 lb

## 2022-05-27 DIAGNOSIS — Z9012 Acquired absence of left breast and nipple: Secondary | ICD-10-CM | POA: Diagnosis not present

## 2022-05-27 DIAGNOSIS — C50412 Malignant neoplasm of upper-outer quadrant of left female breast: Secondary | ICD-10-CM

## 2022-05-27 DIAGNOSIS — M25512 Pain in left shoulder: Secondary | ICD-10-CM | POA: Insufficient documentation

## 2022-05-27 DIAGNOSIS — Z79811 Long term (current) use of aromatase inhibitors: Secondary | ICD-10-CM | POA: Insufficient documentation

## 2022-05-27 DIAGNOSIS — Z170 Estrogen receptor positive status [ER+]: Secondary | ICD-10-CM | POA: Insufficient documentation

## 2022-05-27 LAB — CMP (CANCER CENTER ONLY)
ALT: 12 U/L (ref 0–44)
AST: 17 U/L (ref 15–41)
Albumin: 4.3 g/dL (ref 3.5–5.0)
Alkaline Phosphatase: 52 U/L (ref 38–126)
Anion gap: 8 (ref 5–15)
BUN: 14 mg/dL (ref 6–20)
CO2: 27 mmol/L (ref 22–32)
Calcium: 9.8 mg/dL (ref 8.9–10.3)
Chloride: 104 mmol/L (ref 98–111)
Creatinine: 0.97 mg/dL (ref 0.44–1.00)
GFR, Estimated: >60 mL/min (ref >60)
Glucose, Bld: 110 mg/dL — ABNORMAL HIGH (ref 70–99)
Potassium: 4.7 mmol/L (ref 3.5–5.1)
Sodium: 139 mmol/L (ref 135–145)
Total Bilirubin: 0.5 mg/dL (ref 0.3–1.2)
Total Protein: 7 g/dL (ref 6.5–8.1)

## 2022-05-27 LAB — CBC WITH DIFFERENTIAL (CANCER CENTER ONLY)
Abs Immature Granulocytes: 0.02 10*3/uL (ref 0.00–0.07)
Basophils Absolute: 0 10*3/uL (ref 0.0–0.1)
Basophils Relative: 1 %
Eosinophils Absolute: 0 10*3/uL (ref 0.0–0.5)
Eosinophils Relative: 1 %
HCT: 37 % (ref 36.0–46.0)
Hemoglobin: 12.8 g/dL (ref 12.0–15.0)
Immature Granulocytes: 1 %
Lymphocytes Relative: 37 %
Lymphs Abs: 1.2 10*3/uL (ref 0.7–4.0)
MCH: 32.1 pg (ref 26.0–34.0)
MCHC: 34.6 g/dL (ref 30.0–36.0)
MCV: 92.7 fL (ref 80.0–100.0)
Monocytes Absolute: 0.2 10*3/uL (ref 0.1–1.0)
Monocytes Relative: 6 %
Neutro Abs: 1.8 10*3/uL (ref 1.7–7.7)
Neutrophils Relative %: 54 %
Platelet Count: 203 10*3/uL (ref 150–400)
RBC: 3.99 MIL/uL (ref 3.87–5.11)
RDW: 13.3 % (ref 11.5–15.5)
WBC Count: 3.3 10*3/uL — ABNORMAL LOW (ref 4.0–10.5)
nRBC: 0 % (ref 0.0–0.2)

## 2022-05-27 MED ORDER — GABAPENTIN 100 MG PO CAPS: 100.0000 mg | ORAL_CAPSULE | Freq: Three times a day (TID) | ORAL | 0 refills | Status: DC

## 2022-05-27 NOTE — Progress Notes (Signed)
[facility]   Telephone:([phone]) [phone] Fax:([phone]) [phone]   Clinic Follow up Note   Patient Care Team: Pollyann Samples, NP as PCP - General (Nurse Practitioner) Pershing Proud, RN as Oncology Nurse Navigator Donnelly Angelica, RN as Oncology Nurse Navigator Almond Lint, MD as Consulting Physician (General Surgery) Malachy Mood, MD as Consulting Physician (Hematology) Dorothy Puffer, MD as Consulting Physician (Radiation Oncology) Adline Potter, NP as Nurse Practitioner (Obstetrics and Gynecology) Pollyann Samples, NP as Nurse Practitioner (Nurse Practitioner)  Date of Service:  05/27/2022  CHIEF COMPLAINT: f/u of left breast cancer   CURRENT THERAPY:  Verzenio 150 mg twice daily, monthly Zoladex injections, and daily exemestane   ASSESSMENT:  Pamela Reyes is a 42 y.o. 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 Biopsy on 11/05/20 confirmed invasive ductal carcinoma, with metastatic involvement of one lymph node. -staging CT CAP and bone scan on 11/19/20 showed no distant metastasis -she underwent left mastectomy on 01/06/21 under Dr. Donell Beers. Pathology showed 6.5 cm invasive ductal carcinoma, grade 2, involving dermis of nipple without epidermal involvement. Margins were negative, but all 4 lymph nodes showed metastatic disease. -s/p 4 cycles AC 02/11/21 - 03/25/21 and Abraxane 04/08/21 - 06/24/21. -PET scan on 06/16/21 showed no convincing evidence of local recurrence or metastatic disease. -left ALND on 07/07/21 with Dr. Donell Beers, path showed 1/11 positive nodes -s/p postmastectomy radiation by Dr. Mitzi Hansen 08/16/21 - 10/04/21. -she started Zoladex in 07/07/21 and letrozole on 07/21/21. She developed worsening joint pain and was switched to exemestane  in early 10/2021.  -she started Verzenio 100mg  BID on 10/28/21, dose increased to 150mg  bid later. She had UTI, fatigue etc, but overall tolerating better now -She stopped Zoladex injection after last visit in February 2024 due to multiple menopause symptoms and recurrent UTI. -Exemestane was changed to tamoxifen and she started on April 08, 2022. 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  Theoretically, tamoxifen may not work well for her due to the poor metabolism (to convert to active component), but clinical trial data has not shown worse cancer outcome in patients with CYP2D64 metabolizer when they are on tamoxifen.  I explained the above to patient, and I recommend her to continue tamoxifen 20 mg daily.  2.  Left shoulder and scapular pain/burning sensation  -new in past month -No tenderness on exam, no joint edema, no limited range of motion. -Possible related to her previous left breast surgery and axillary lymph node biopsy. -She had a CT chest in January 2024, which did not show any bone metastasis in her shoulders or scapula, I did not think she needs additional imaging at this point. -I also discussed option of follow-up with her physical therapy, to see if she is a candidate for dry needling -I called in low-dose gabapentin for her pain control -Will follow-up clinically.      PLAN: -lab reviewed -Continue tamoxifen -I called in gabapentin for her left shoulder blade pain  -Lab and follow-up in 3 months   SUMMARY OF ONCOLOGIC HISTORY: Oncology History Overview Note   Cancer Staging  Malignant neoplasm of upper-outer quadrant of left breast in female, estrogen receptor positive (HCC) Staging form: Breast, AJCC 8th Edition - Clinical stage from 11/05/2020: Stage IIA (cT2, cN1, cM0, G1,  ER+, PR+, HER2: Equivocal) - Signed by Malachy Mood, MD on 11/10/2020 - Pathologic stage from 01/06/2021: Stage IB (pT3, pN2a, cM0, G2, ER+, PR+, HER2-) - Signed by Malachy Mood, MD on 01/12/2021     Malignant neoplasm of upper-outer quadrant of left breast in female, estrogen receptor positive  10/28/2020 Mammogram   Bilateral Diagnostic Mammogram; Left Breast Ultrasound  IMPRESSION: 1. Suspicious palpable mass/hypoechoic area in the left breast at 12 o'clock retroareolar measuring 4.5 cm.   2.  Suspicious lymph node in the left axilla.   11/05/2020 Cancer Staging   Staging form: Breast, AJCC 8th Edition - Clinical stage from 11/05/2020: Stage IIA (cT2, cN1, cM0, G1, ER+, PR+, HER2: Equivocal) - Signed by Malachy Mood, MD on 11/10/2020 Stage prefix: Initial diagnosis Histologic grading system: 3 grade system   11/05/2020 Pathology Results   Diagnosis 1. Breast, left, needle core biopsy, 12 o'clock subareolar, ribbon clip - INVASIVE MAMMARY CARCINOMA. SEE NOTE 1. Carcinoma measures 1.5 cm in greatest linear dimension and appears grade 2. 1. Immunohistochemical stain for E-cadherin is positive in the tumor cells, consistent with a ductal phenotype. 1. PROGNOSTIC INDICATORS Results: The tumor cells are EQUIVOCAL for Her2 (2+). Her2 by FISH will be performed and results reported separately. Estrogen Receptor: 70%, POSITIVE, STRONG-MODERATE STAINING INTENSITY Progesterone Receptor: 80%, POSITIVE, MODERATE STAINING INTENSITY Proliferation Marker Ki67: 1%  2. Lymph node, needle/core biopsy, left axilla, tribell clip - METASTATIC CARCINOMA TO A LYMPH NODE. SEE NOTE 2. Largest contiguous focus of metastatic carcinoma measures 0.3 cm. 2. PROGNOSTIC INDICATORS Results: The tumor cells are EQUIVOCAL for Her2 (2+). Her2 by FISH will be performed and results reported separately. Estrogen Receptor: 90%, POSITIVE, STRONG STAINING INTENSITY Progesterone Receptor: 95%, POSITIVE, STRONG STAINING INTENSITY Proliferation Marker Ki67: 1%   11/10/2020 Initial Diagnosis   Malignant neoplasm of upper-outer quadrant of left breast in female, estrogen receptor positive (HCC)    11/19/2020 Imaging   EXAM: CT CHEST, ABDOMEN, AND PELVIS WITH CONTRAST  IMPRESSION: 1. Left breast mass  as previously described. 2. Enhancing subcentimeter left axillary lymph nodes. 3. No evidence of distal metastatic disease. 4. Small hiatal hernia   11/19/2020 Imaging   EXAM: NUCLEAR MEDICINE WHOLE BODY BONE SCAN  IMPRESSION: No evidence of metastatic disease.   11/21/2020 Imaging   EXAM: BILATERAL BREAST MRI WITH AND WITHOUT CONTRAST  IMPRESSION: 1. Suspicious 1.3 centimeter mass in the anterior UPPER central RIGHT breast warranting tissue diagnosis. (Image 65 of series 7). 2. 4 millimeter possible satellite nodule posterior to the 1.3 centimeter mass in the RIGHT breast. (Image 63 of series 7). 3. Indeterminate oval mass in the UPPER central middle depth of the RIGHT breast warranting tissue diagnosis. (Image 60 of series 7). 4. Large area of enhancement in the central portion of the LEFT breast, involving all quadrants. There is associated enhancement and retraction of the LEFT nipple, with significantly smaller size of the LEFT breast. Enhancement spans at least 6.5 centimeters. 5. Enlarged, previously biopsied LEFT axillary lymph node.   11/24/2020 Genetic Testing   Negative hereditary cancer genetic testing: no pathogenic variants detected in [facility] BRCAPlus STAT Panel or [employer] CustomNext-Cancer +RNAinsight Panel.  The report dates are November 24, 2020 and November 27, 2020, respectively.   The BRCAplus panel offered by [employer] and includes sequencing and deletion/duplication analysis for the following 8 genes: ATM, BRCA1, BRCA2, CDH1, CHEK2, PALB2, PTEN, and TP53.  The CustomNext-Cancer+RNAinsight panel offered by [employer] includes sequencing and rearrangement analysis for the following 47 genes:  APC,  ATM, AXIN2, BARD1, BMPR1A, BRCA1, BRCA2, BRIP1, CDH1, CDK4, CDKN2A, CHEK2, DICER1, EPCAM, GREM1, HOXB13, MEN1, MLH1, MSH2, MSH3, MSH6, MUTYH, NBN, NF1, NF2,  NTHL1, PALB2, PMS2, POLD1, POLE, PTEN, RAD51C, RAD51D, RECQL, RET, SDHA, SDHAF2, SDHB, SDHC, SDHD, SMAD4, SMARCA4, STK11, TP53, TSC1, TSC2, and VHL.  RNA data is routinely analyzed for use in variant interpretation for all genes.    11/27/2020 Pathology Results   Diagnosis Breast, right, needle core biopsy, anterior upper central - FIBROADENOMATOID AND FIBROCYSTIC CHANGES WITH CALCIFICATIONS - PSEUDOANGIOMATOUS STROMAL HYPERPLASIA - FOCAL PERIDUCTULAR CHRONIC INFLAMMATION - NO MALIGNANCY IDENTIFIED   01/06/2021 Cancer Staging   Staging form: Breast, AJCC 8th Edition - Pathologic stage from 01/06/2021: Stage IB (pT3, pN2a, cM0, G2, ER+, PR+, HER2-) - Signed by Malachy Mood, MD on 01/12/2021 Stage prefix: Initial diagnosis Histologic grading system: 3 grade system Residual tumor (R): R0 - None   01/06/2021 Definitive Surgery   FINAL MICROSCOPIC DIAGNOSIS:   A. LYMPH NODE, LEFT AXILLARY, SENTINEL, EXCISION:  - Metastatic carcinoma to a lymph node (1/1)  - Focus of metastatic carcinoma measures 1.5 cm without evidence of extranodal extension   B. 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 BREAST, LEFT INFERIOLATERAL MARGIN, EXCISION:  - Benign fibroadipose tissue  - Negative for carcinoma   D. LYMPH NODE, LEFT AXILLARY #1, SENTINEL, EXCISION:  - Metastatic carcinoma to a lymph node (1/1)  - Focus of metastatic carcinoma measures 1.4 cm without evidence of extranodal extension   E. LYMPH NODE, LEFT AXILLARY #2, SENTINEL, EXCISION:  - Metastatic carcinoma to a lymph node (1/1)  - Focus of metastatic carcinoma measures 0.5 cm without evidence of extranodal extension   F. LYMPH NODE, LEFT AXILLARY #3, SENTINEL, EXCISION:  - Metastatic carcinoma to a lymph node (1/1)  - Focus of metastatic  carcinoma measures 0.9 cm without evidence of extranodal extension   G. LYMPH NODE, LEFT AXILLARY #4, SENTINEL, EXCISION:  - Benign fibroadipose tissue, negative for carcinoma  - Lymphoid tissue is not identified    02/11/2021 - 06/24/2021 Chemotherapy   Patient is on Treatment Plan : BREAST ADJUVANT DOSE DENSE AC q14d / PACLitaxel q7d     06/16/2021 PET scan   IMPRESSION: 1. Postsurgical change of left mastectomy and left axillary lymph node dissection with tissue expander in place. Mild hypermetabolic activity about the surgical site without suspicious focal hypermetabolic soft tissue nodularity, favored to reflect postsurgical change.   2. No convincing scintigraphic evidence of hypermetabolic local recurrence or metastatic disease within the neck, chest, abdomen or pelvis.      INTERVAL HISTORY:  Pamela Reyes is here for a follow up of left breast cancer  She was last seen me on 03/03/2022.  She is overall doing well, tolerating tamoxifen better.  Her joint pain has mostly resolved.  She does notice some achiness and burning sensation in the left shoulder blade in the past months, no limited range of motion of the left shoulder.  No lymphedema or other new complaints.  She has been using estrogen vaginal cream for vaginal stenosis.  She is tolerating Verzenio well, with occasional diarrhea.  No other new complaints.    All other systems were reviewed with the patient and are negative.  MEDICAL HISTORY:  Past Medical History:  Diagnosis Date  Anxiety    Axillary pain, right 01/18/2021   Will get right axillary ultrasound.   Breast cancer (HCC)    Breast pain, left 10/06/2020   Encounter for gynecological examination with Papanicolaou smear of cervix 02/10/2021   Encounter for screening fecal occult blood testing 02/10/2021   Family history of breast cancer 11/11/2020   Fibroid 10/27/2020   Has 1 x .9 x .6 cm subserosal fibroid   H/O left mastectomy    left mastectomy 2022    History of hiatal hernia    Left breast mass 10/06/2020   +breast cancer   LLQ pain 10/06/2020   PONV (postoperative nausea and vomiting)    Port-A-Cath in place 03/25/2021    SURGICAL HISTORY: Past Surgical History:  Procedure Laterality Date   AXILLARY LYMPH NODE DISSECTION Left 07/07/2021   Procedure: LEFT AXILLARY LYMPH NODE DISSECTION;  Surgeon: Almond Lint, MD;  Location: [facility];  Service: General;  Laterality: Left;   BREAST RECONSTRUCTION WITH PLACEMENT OF TISSUE EXPANDER AND FLEX HD (ACELLULAR HYDRATED DERMIS) Left 01/06/2021   Procedure: LEFT BREAST RECONSTRUCTION WITH PLACEMENT OF TISSUE EXPANDER AND ACELLULAR  DERMIS;  Surgeon: Glenna Fellows, MD;  Location: [facility];  Service: Plastics;  Laterality: Left;   MASTECTOMY W/ SENTINEL NODE BIOPSY Left 01/06/2021   Procedure: LEFT MASTECTOMY WITH MAGTRACE;  Surgeon: Almond Lint, MD;  Location: [facility];  Service: General;  Laterality: Left;   PORT-A-CATH REMOVAL Right 07/07/2021   Procedure: REMOVAL PORT-A-CATH;  Surgeon: Almond Lint, MD;  Location: [facility];  Service: General;  Laterality: Right;   PORTACATH PLACEMENT Right 01/19/2021   Procedure: INSERTION PORT-A-CATH;  Surgeon: Almond Lint, MD;  Location: [facility];  Service: General;  Laterality: Right;   RADIOACTIVE SEED GUIDED AXILLARY SENTINEL LYMPH NODE Left 01/06/2021   Procedure: RADIOACTIVE SEED GUIDED SENTINEL LYMPH NODE BIOPSY;  Surgeon: Almond Lint, MD;  Location: [facility];  Service: General;  Laterality: Left;   REMOVAL OF TISSUE EXPANDER AND PLACEMENT OF IMPLANT Left 02/18/2022   Procedure: REMOVAL OF TISSUE EXPANDER AND PLACEMENT OF IMPLANT;  Surgeon: Glenna Fellows, MD;  Location: [facility];  Service: Plastics;  Laterality: Left;   SENTINEL NODE BIOPSY Left 01/06/2021   Procedure: LEFT AXILLARY SENTINEL LYMPH  NODE BIOPSY;  Surgeon: Almond Lint, MD;  Location: [facility];  Service: General;  Laterality: Left;    WISDOM TOOTH EXTRACTION      I have reviewed the social history and family history with the patient and they are unchanged from previous note.  ALLERGIES:  is allergic to prochlorperazine.  MEDICATIONS:  Current Outpatient Medications  Medication Sig Dispense Refill   acetaminophen (TYLENOL) 500 MG tablet Take 500-1,000 mg by mouth every 6 (six) hours as needed for moderate pain or headache.     Biotin 1 MG CAPS Take by mouth.     Cholecalciferol (DIALYVITE VITAMIN D 5000) 125 MCG (5000 UT) capsule Take 5,000 Units by mouth daily. Vit D and K supplement combined     diazepam (VALIUM) 5 MG tablet Place 1 tablet vaginally nightly as needed for muscle spasm/ pelvic pain. 30 tablet 0   estradiol (ESTRACE) 0.1 MG/GM vaginal cream Place 0.5g nightly for two weeks then twice a week after 30 g 11   GOSERELIN ACETATE Olar      hydrOXYzine (ATARAX) 10 MG tablet TAKE 1 TABLET (10 MG TOTAL) BY MOUTH DAILY AS NEEDED FOR ANXIETY, MAY TAKE 2 TIMES A DAY IF NEEDED 180 tablet 1  lidocaine (XYLOCAINE) 5 % ointment Apply vaginally as needed 35.44 g 0   loratadine (CLARITIN) 10 MG tablet Take 10 mg by mouth daily.     nitrofurantoin (MACRODANTIN) 50 MG capsule Take 1 capsule (50 mg total) by mouth as directed. Take 1 hour prior or immediately after sexual intercourse. 20 capsule 2   omeprazole (PRILOSEC) 20 MG capsule Take 20 mg by mouth daily.     phenazopyridine (PYRIDIUM) 200 MG tablet Take 1 tablet (200 mg total) by mouth 3 (three) times daily as needed for pain. 20 tablet 2   tamoxifen (NOLVADEX) 20 MG tablet Take 1 tablet (20 mg total) by mouth daily. 30 tablet 3   tretinoin (RETIN-A) 0.025 % cream Apply 1 application. topically at bedtime.     UNABLE TO FIND Med Name: [place] Tail Mushroom/ [employer] Mushroom     UNABLE TO FIND Med Name: Almyra Free     VERZENIO 150 MG tablet TAKE 1 TABLET BY MOUTH 2 TIMES A DAY 56 tablet 0   No current facility-administered medications for this visit.    PHYSICAL  EXAMINATION: ECOG PERFORMANCE STATUS: 0 - Asymptomatic  There were no vitals filed for this visit.  Wt Readings from Last 3 Encounters:  04/18/22 125 lb 6 oz (56.9 kg)  03/25/22 129 lb (58.5 kg)  03/03/22 132 lb 14.4 oz (60.3 kg)     GENERAL:alert, no distress and comfortable SKIN: skin color normal, no rashes or significant lesions EYES: normal, Conjunctiva are pink and non-injected, sclera clear  NEURO: alert & oriented x 3 with fluent speech NECK: (-)supple, thyroid normal size, non-tender, without nodularity LYMPH:  (-)  no palpable lymphadenopathy in the cervical, axillary  ABDOMEN(-) :abdomen soft, non-tender and normal bowel sounds BREAST:LT Breast Reconstruction Surgery no palpable mass, Breast exam benign.     LABORATORY DATA:  I have reviewed the data as listed    Latest Ref Rng & Units 04/29/2022    1:25 PM 03/31/2022    8:42 AM 03/03/2022    8:02 AM  CBC  WBC 4.0 - 10.5 K/uL 3.1  2.6  3.0   Hemoglobin 12.0 - 15.0 g/dL 16.1  09.6  04.5   Hematocrit 36.0 - 46.0 % 37.0  40.6  37.2   Platelets 150 - 400 K/uL 187  197  245         Latest Ref Rng & Units 04/29/2022    1:25 PM 03/31/2022    8:42 AM 03/03/2022    8:02 AM  CMP  Glucose 70 - 99 mg/dL 79  93  90   BUN 6 - 20 mg/dL 16  18  21    Creatinine 0.44 - 1.00 mg/dL 4.09  8.11  9.14   Sodium 135 - 145 mmol/L 140  139  141   Potassium 3.5 - 5.1 mmol/L 4.0  4.8  4.2   Chloride 98 - 111 mmol/L 104  102  104   CO2 22 - 32 mmol/L 28  27  31    Calcium 8.9 - 10.3 mg/dL 78.2  95.6  21.3   Total Protein 6.5 - 8.1 g/dL 7.1  7.8  7.2   Total Bilirubin 0.3 - 1.2 mg/dL 0.5  0.6  0.4   Alkaline Phos 38 - 126 U/L 48  58  49   AST 15 - 41 U/L 19  19  27    ALT 0 - 44 U/L 13  17  34       RADIOGRAPHIC STUDIES: I have personally  reviewed the radiological images as listed and agreed with the findings in the report. No results found.    No orders of the defined types were placed in this encounter.  All questions were  answered. The patient knows to call the clinic with any problems, questions or concerns. No barriers to learning was detected. The total time spent in the appointment was 30 minutes.     Malachy Mood, MD 05/27/2022

## 2022-06-02 ENCOUNTER — Ambulatory Visit (INDEPENDENT_AMBULATORY_CARE_PROVIDER_SITE_OTHER): Payer: BC Managed Care – PPO | Admitting: Adult Health

## 2022-06-02 ENCOUNTER — Encounter: Payer: Self-pay | Admitting: Adult Health

## 2022-06-02 ENCOUNTER — Other Ambulatory Visit (HOSPITAL_COMMUNITY)
Admission: RE | Admit: 2022-06-02 | Discharge: 2022-06-02 | Disposition: A | Discharge status: Home / Self Care | Payer: BC Managed Care – PPO | Source: Ambulatory Visit | Attending: Adult Health | Admitting: Adult Health

## 2022-06-02 VITALS — BP 105/61 | HR 80 | Ht 64.0 in | Wt 124.0 lb

## 2022-06-02 DIAGNOSIS — N952 Postmenopausal atrophic vaginitis: Secondary | ICD-10-CM | POA: Insufficient documentation

## 2022-06-02 DIAGNOSIS — Z1211 Encounter for screening for malignant neoplasm of colon: Secondary | ICD-10-CM | POA: Diagnosis not present

## 2022-06-02 DIAGNOSIS — Z01419 Encounter for gynecological examination (general) (routine) without abnormal findings: Secondary | ICD-10-CM

## 2022-06-02 DIAGNOSIS — Z853 Personal history of malignant neoplasm of breast: Secondary | ICD-10-CM | POA: Diagnosis not present

## 2022-06-02 LAB — HEMOCCULT GUIAC POC 1CARD (OFFICE): Fecal Occult Blood, POC: NEGATIVE

## 2022-06-02 NOTE — Progress Notes (Signed)
Patient ID: Pamela Reyes, female   DOB: [birth year], 42 y.o.   MRN: [id number] History of Present Illness: Pamela Reyes is a 42 year old white female,with SO, G0P0, in for a well woman gyn exam and she wants a pap. She has history of breast cancer.  PCP is Dulce Sellar NP.   Current Medications, Allergies, Past Medical History, Past Surgical History, Family History and Social History were reviewed in [employer] record.     Review of Systems: Patient denies any headaches, hearing loss, blurred vision, shortness of breath, chest pain, abdominal pain, problems with bowel movements, urination, or intercourse(not active). No joint pain or mood swings.  +tired Has itching in vaginal area and towards anal area Has had some vaginal atrophy and using estrace vaginal cream Periods stopped after chemo  Physical Exam:BP 105/61 (BP Location: Right Arm, Patient Position: Sitting, Cuff Size: Normal)   Pulse 80   Ht  (1.626 m)   Wt 124 lb (56.2 kg)   BMI 21.28 kg/m   General:  Well developed, well nourished, no acute distress Skin:  Warm and dry Neck:  Midline trachea, normal thyroid, good ROM, no lymphadenopathy Lungs; Clear to auscultation bilaterally Breast:  No dominant palpable mass, retraction, or nipple discharge, on the right, sp mastectomy on the left and has expander Cardiovascular: Regular rate and rhythm Abdomen:  Soft, non tender, no hepatosplenomegaly Pelvic:  External genitalia is normal in appearance, no lesions.  The vagina is pale, does have ?estrogen cream in vault. Urethra has no lesions or masses. The cervix is smooth and nulliparous, pap with HR HPV genotyping performed(she is aware could have to repeat if cream obscuring).  Uterus is felt to be normal size, shape, and contour.  No adnexal masses or tenderness noted.Bladder is non tender, no masses felt. Rectal: Good sphincter tone, no polyps, or hemorrhoids felt.  Hemoccult  negative. Extremities/musculoskeletal:  No swelling or varicosities noted, no clubbing or cyanosis Psych:  No mood changes, alert and cooperative,seems happy AA is 1 Fall risk is low    06/02/2022    2:34 PM 02/10/2021    2:36 PM 10/23/2020    9:53 AM  Depression screen PHQ 2/9  Decreased Interest 1 0 0  Down, Depressed, Hopeless 1 1 0  PHQ - 2 Score 2 1 0  Altered sleeping 1 1 0  Tired, decreased energy 2 1 0  Change in appetite 0 0 0  Feeling bad or failure about yourself  0 0 0  Trouble concentrating 0 1 0  Moving slowly or fidgety/restless 0 0 0  Suicidal thoughts 0 0 0  PHQ-9 Score 5 4 0  Difficult doing work/chores   Not difficult at all       06/02/2022    2:34 PM 02/10/2021    2:36 PM 10/06/2020    2:52 PM  GAD 7 : Generalized Anxiety Score  Nervous, Anxious, on Edge Control/stop worrying Worry too much - different things 1 1 0  Trouble relaxing 1 1 0  Restless 0 0 0  Easily annoyed or irritable 1 1 0  Afraid - awful might happen 0 1 1  Total GAD 7 Score Upstream - 06/02/22 1433       Pregnancy Intention Screening   Does the patient want to become pregnant in the next year? No    Does the patient's partner want to become pregnant  in the next year? No    Would the patient like to discuss contraceptive options today? No      Contraception Wrap Up   Current Method No Method - Other Reason    Reason for No Current Contraceptive Method at Intake ([facility] Only) Other    End Method No Method - Other Reason    Contraception Counseling Provided No             Examination chaperoned by Malachy Mood LPN  Impression and Plan: 1. Encounter for gynecological examination with Papanicolaou smear of cervix Pap sent Physical in 1 year Mammogram was negative 02/09/22 Labs with PCP or oncologist  Discussed cologuard or colonoscopy at 44  - Cytology - PAP( [place])  2. Encounter for screening fecal occult blood testing Hemoccult was negative    3. History of breast cancer Talk with surgeon if MRI needed right breast   4. Vaginal atrophy She is using estrace vaginal cream

## 2022-06-06 LAB — CYTOLOGY - PAP
Adequacy: ABSENT
Comment: NEGATIVE
Diagnosis: NEGATIVE
High risk HPV: NEGATIVE

## 2022-06-09 ENCOUNTER — Other Ambulatory Visit: Payer: Self-pay | Admitting: Hematology

## 2022-06-23 ENCOUNTER — Other Ambulatory Visit: Payer: Self-pay | Admitting: Obstetrics and Gynecology

## 2022-06-23 DIAGNOSIS — N941 Unspecified dyspareunia: Secondary | ICD-10-CM

## 2022-06-23 DIAGNOSIS — N952 Postmenopausal atrophic vaginitis: Secondary | ICD-10-CM

## 2022-06-28 ENCOUNTER — Other Ambulatory Visit: Payer: Self-pay | Admitting: Nurse Practitioner

## 2022-07-13 ENCOUNTER — Other Ambulatory Visit: Payer: Self-pay | Admitting: Hematology

## 2022-07-19 ENCOUNTER — Encounter: Payer: Self-pay | Admitting: Family

## 2022-07-19 ENCOUNTER — Ambulatory Visit (INDEPENDENT_AMBULATORY_CARE_PROVIDER_SITE_OTHER): Payer: BC Managed Care – PPO | Admitting: Family

## 2022-07-19 VITALS — BP 120/76 | HR 76 | Temp 97.1°F | Ht 64.0 in | Wt 119.5 lb

## 2022-07-19 DIAGNOSIS — R109 Unspecified abdominal pain: Secondary | ICD-10-CM | POA: Diagnosis not present

## 2022-07-19 LAB — POCT URINALYSIS DIPSTICK
Bilirubin, UA: NEGATIVE
Blood, UA: NEGATIVE
Glucose, UA: NEGATIVE
Ketones, UA: NEGATIVE
Leukocytes, UA: NEGATIVE
Nitrite, UA: NEGATIVE
Protein, UA: NEGATIVE
Spec Grav, UA: 1.02 (ref 1.010–1.025)
Urobilinogen, UA: 0.2 E.U./dL
pH, UA: 6 (ref 5.0–8.0)

## 2022-07-19 NOTE — Progress Notes (Signed)
Patient ID: Pamela Reyes, female    DOB: 14-Jul-1980, 42 y.o.   MRN: [id number]  Chief Complaint  Patient presents with   Abdominal Pain    Pt c/o left sided abdominal pain, has been getting worse last 3 weeks. Has tried heat which does relieve pain slightly.     HPI:      Left side pain:  HX:  pt had similar pain 09/2021 - U/S negative and pain seemed to resolve on its own. She reports she feels it again, achy pain on her left side,  under her ribs, not r/t  eating, happens more often at work, so she wonders if possibly stress/psychosomatic related,  at work she is standing & walking, nothing strenuous. Denies any nausea or dizziness, bowels are moving per usual, no constipation.  CXR done end of last year neg, also had CT scan which was neg for a separate breathing issue. As last time, no palpable tenderness, no swelling, no palpable mass.  Per review of Oncology notes, pt started Tamoxifen in March - SE is pain, arthralgia, myalgia.  Still taking Verzenio which also has SE of abd pain.  Assessment & Plan:  1. Left sided abdominal pain - UA neg.  reached out to oncology provider re:  her Versenio and/or Tamoxifen as possibilities for causing her pain. Do not want to do an unnecessary CT at this point. Encouraged pt continue Tylenol tid prn along with heat application, also can try OTC Lidocaine cream or patches tid prn. F/U with Dr. Mosetta Putt w/oncology at upcoming appt re: pain.  - POCT Urinalysis Dipstick   Subjective:    Outpatient Medications Prior to Visit  Medication Sig Dispense Refill   acetaminophen (TYLENOL) 500 MG tablet Take 500-1,000 mg by mouth every 6 (six) hours as needed for moderate pain or headache.     Biotin 1 MG CAPS Take by mouth.     Cholecalciferol (DIALYVITE VITAMIN D 5000) 125 MCG (5000 UT) capsule Take 5,000 Units by mouth daily. Vit D and K supplement combined     diazepam (VALIUM) 5 MG tablet Place 1 tablet vaginally nightly as needed for muscle spasm/ pelvic  pain. 30 tablet 0   estradiol (ESTRACE) 0.1 MG/GM vaginal cream PLACE 0.5G NIGHTLY FOR TWO WEEKS THEN TWICE A WEEK AFTER 126 g 4   gabapentin (NEURONTIN) 100 MG capsule Take 1 capsule (100 mg total) by mouth 3 (three) times daily. 90 capsule 0   hydrOXYzine (ATARAX) 10 MG tablet TAKE 1 TABLET (10 MG TOTAL) BY MOUTH DAILY AS NEEDED FOR ANXIETY, MAY TAKE 2 TIMES A DAY IF NEEDED 180 tablet 1   loratadine (CLARITIN) 10 MG tablet Take 10 mg by mouth daily.     nitrofurantoin (MACRODANTIN) 50 MG capsule Take 1 capsule (50 mg total) by mouth as directed. Take 1 hour prior or immediately after sexual intercourse. 20 capsule 2   omeprazole (PRILOSEC) 20 MG capsule Take 20 mg by mouth daily.     tamoxifen (NOLVADEX) 20 MG tablet TAKE 1 TABLET BY MOUTH EVERY DAY 90 tablet 1   tretinoin (RETIN-A) 0.025 % cream Apply 1 application. topically at bedtime.     VERZENIO 150 MG tablet TAKE 1 TABLET BY MOUTH 2 TIMES A DAY 56 tablet 0   No facility-administered medications prior to visit.   Past Medical History:  Diagnosis Date   Anxiety    Axillary pain, right 01/18/2021   Will get right axillary ultrasound.   Breast cancer (HCC)  Breast pain, left 10/06/2020   Encounter for gynecological examination with Papanicolaou smear of cervix 02/10/2021   Encounter for screening fecal occult blood testing 02/10/2021   Family history of breast cancer 11/11/2020   Fibroid 10/27/2020   Has 1 x .9 x .6 cm subserosal fibroid   H/O left mastectomy    left mastectomy 2022   History of hiatal hernia    Left breast mass 10/06/2020   +breast cancer   LLQ pain 10/06/2020   PONV (postoperative nausea and vomiting)    Port-A-Cath in place 03/25/2021   Past Surgical History:  Procedure Laterality Date   AXILLARY LYMPH NODE DISSECTION Left 07/07/2021   Procedure: LEFT AXILLARY LYMPH NODE DISSECTION;  Surgeon: Almond Lint, MD;  Location: [facility];  Service: General;  Laterality: Left;   BREAST  RECONSTRUCTION WITH PLACEMENT OF TISSUE EXPANDER AND FLEX HD (ACELLULAR HYDRATED DERMIS) Left 01/06/2021   Procedure: LEFT BREAST RECONSTRUCTION WITH PLACEMENT OF TISSUE EXPANDER AND ACELLULAR  DERMIS;  Surgeon: Glenna Fellows, MD;  Location: [facility];  Service: Plastics;  Laterality: Left;   MASTECTOMY W/ SENTINEL NODE BIOPSY Left 01/06/2021   Procedure: LEFT MASTECTOMY WITH MAGTRACE;  Surgeon: Almond Lint, MD;  Location: [facility];  Service: General;  Laterality: Left;   PORT-A-CATH REMOVAL Right 07/07/2021   Procedure: REMOVAL PORT-A-CATH;  Surgeon: Almond Lint, MD;  Location: [facility];  Service: General;  Laterality: Right;   PORTACATH PLACEMENT Right 01/19/2021   Procedure: INSERTION PORT-A-CATH;  Surgeon: Almond Lint, MD;  Location: [facility];  Service: General;  Laterality: Right;   RADIOACTIVE SEED GUIDED AXILLARY SENTINEL LYMPH NODE Left 01/06/2021   Procedure: RADIOACTIVE SEED GUIDED SENTINEL LYMPH NODE BIOPSY;  Surgeon: Almond Lint, MD;  Location: [facility];  Service: General;  Laterality: Left;   REMOVAL OF TISSUE EXPANDER AND PLACEMENT OF IMPLANT Left 02/18/2022   Procedure: REMOVAL OF TISSUE EXPANDER AND PLACEMENT OF IMPLANT;  Surgeon: Glenna Fellows, MD;  Location: [facility];  Service: Plastics;  Laterality: Left;   SENTINEL NODE BIOPSY Left 01/06/2021   Procedure: LEFT AXILLARY SENTINEL LYMPH  NODE BIOPSY;  Surgeon: Almond Lint, MD;  Location: [facility];  Service: General;  Laterality: Left;   WISDOM TOOTH EXTRACTION     Allergies  Allergen Reactions   Prochlorperazine Other (See Comments)    Severe restlessness. Akathisia.      Objective:    Physical Exam Vitals and nursing note reviewed.  Constitutional:      Appearance: Normal appearance.  Cardiovascular:     Rate and Rhythm: Normal rate and regular rhythm.  Pulmonary:     Effort: Pulmonary effort is normal.     Breath sounds: Normal breath sounds.  Musculoskeletal:        General:  Normal range of motion.  Skin:    General: Skin is warm and dry.  Neurological:     Mental Status: She is alert.  Psychiatric:        Mood and Affect: Mood normal.        Behavior: Behavior normal.    BP 120/76 (BP Location: Left Arm, Patient Position: Sitting, Cuff Size: Normal)   Pulse 76   Temp (!) 97.1 F (36.2 C) (Temporal)   Ht 5\' 4"  (1.626 m)   Wt 119 lb 8 oz (54.2 kg)   SpO2 100%   BMI 20.51 kg/m  Wt Readings from Last 3 Encounters:  07/19/22 119 lb 8 oz (54.2 kg)  06/02/22 124 lb (56.2 kg)  05/27/22 124 lb 3.2 oz (56.3 kg)       Dulce Sellar, NP

## 2022-07-25 ENCOUNTER — Ambulatory Visit: Payer: BC Managed Care – PPO | Attending: Hematology

## 2022-07-25 VITALS — Wt 119.4 lb

## 2022-07-25 DIAGNOSIS — Z483 Aftercare following surgery for neoplasm: Secondary | ICD-10-CM | POA: Insufficient documentation

## 2022-07-25 NOTE — Therapy (Signed)
OUTPATIENT PHYSICAL THERAPY SOZO SCREENING NOTE   Patient Name: Pamela Reyes MRN: [id number] DOB:1980/10/28, 42 y.o., female Today's Date: 07/25/2022  PCP: Dulce Sellar, NP REFERRING PROVIDER: Malachy Mood, MD   PT End of Session - 07/25/22 0910     Visit Number 6   # unchanged due to screen only   PT Start Time 0909    PT Stop Time 0912    PT Time Calculation (min) 3 min    Activity Tolerance Patient tolerated treatment well    Behavior During Therapy [facility] for tasks assessed/performed             Past Medical History:  Diagnosis Date   Anxiety    Axillary pain, right 01/18/2021   Will get right axillary ultrasound.   Breast cancer (HCC)    Breast pain, left 10/06/2020   Encounter for gynecological examination with Papanicolaou smear of cervix 02/10/2021   Encounter for screening fecal occult blood testing 02/10/2021   Family history of breast cancer 11/11/2020   Fibroid 10/27/2020   Has 1 x .9 x .6 cm subserosal fibroid   H/O left mastectomy    left mastectomy 2022   History of hiatal hernia    Left breast mass 10/06/2020   +breast cancer   LLQ pain 10/06/2020   PONV (postoperative nausea and vomiting)    Port-A-Cath in place 03/25/2021   Past Surgical History:  Procedure Laterality Date   AXILLARY LYMPH NODE DISSECTION Left 07/07/2021   Procedure: LEFT AXILLARY LYMPH NODE DISSECTION;  Surgeon: Almond Lint, MD;  Location: [facility];  Service: General;  Laterality: Left;   BREAST RECONSTRUCTION WITH PLACEMENT OF TISSUE EXPANDER AND FLEX HD (ACELLULAR HYDRATED DERMIS) Left 01/06/2021   Procedure: LEFT BREAST RECONSTRUCTION WITH PLACEMENT OF TISSUE EXPANDER AND ACELLULAR  DERMIS;  Surgeon: Glenna Fellows, MD;  Location: [facility];  Service: Plastics;  Laterality: Left;   MASTECTOMY W/ SENTINEL NODE BIOPSY Left 01/06/2021   Procedure: LEFT MASTECTOMY WITH MAGTRACE;  Surgeon: Almond Lint, MD;  Location: [facility];  Service: General;  Laterality:  Left;   PORT-A-CATH REMOVAL Right 07/07/2021   Procedure: REMOVAL PORT-A-CATH;  Surgeon: Almond Lint, MD;  Location: [facility];  Service: General;  Laterality: Right;   PORTACATH PLACEMENT Right 01/19/2021   Procedure: INSERTION PORT-A-CATH;  Surgeon: Almond Lint, MD;  Location: [facility];  Service: General;  Laterality: Right;   RADIOACTIVE SEED GUIDED AXILLARY SENTINEL LYMPH NODE Left 01/06/2021   Procedure: RADIOACTIVE SEED GUIDED SENTINEL LYMPH NODE BIOPSY;  Surgeon: Almond Lint, MD;  Location: [facility];  Service: General;  Laterality: Left;   REMOVAL OF TISSUE EXPANDER AND PLACEMENT OF IMPLANT Left 02/18/2022   Procedure: REMOVAL OF TISSUE EXPANDER AND PLACEMENT OF IMPLANT;  Surgeon: Glenna Fellows, MD;  Location: [facility];  Service: Plastics;  Laterality: Left;   SENTINEL NODE BIOPSY Left 01/06/2021   Procedure: LEFT AXILLARY SENTINEL LYMPH  NODE BIOPSY;  Surgeon: Almond Lint, MD;  Location: [facility];  Service: General;  Laterality: Left;   WISDOM TOOTH EXTRACTION     Patient Active Problem List   Diagnosis Date Noted   History of breast cancer 06/02/2022   Vaginal atrophy 06/02/2022   Burning with urination 10/25/2021   Urinary frequency 10/25/2021   Urgency of urination 10/25/2021   Left flank pain, chronic 09/27/2021   Paresthesia 06/08/2021   Port-A-Cath in place 03/25/2021   Encounter for screening fecal occult blood testing 02/10/2021   Encounter for gynecological examination with  Papanicolaou smear of cervix 02/10/2021   Genetic testing 11/25/2020   Family history of breast cancer 11/11/2020   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 lymph node, left (HCC) 11/11/2020   Dense breasts 11/11/2020   Malignant neoplasm of upper-outer quadrant of left breast in female, estrogen receptor positive (HCC) 11/10/2020   Fibroid 10/27/2020    REFERRING DIAG: left breast cancer at risk for lymphedema  THERAPY DIAG: Aftercare following surgery for  neoplasm  PERTINENT HISTORY: Diagnosed 10/29/2020 and found by self exam. Biopsy revealed IDC ER, PR +, HER 2 -.  Pt underwent left mastectomy on 01/06/2021 with expander. 4+/5 LN. Chemotherapy completed ACP, Lymph node dissection on 07/07/21 with 1/11 lymph nodes positive. Completed radiation at end of August 2023   PRECAUTIONS: left UE Lymphedema risk, None  SUBJECTIVE: Pt returns for her 3 month L-dex screen.   PAIN:  Are you having pain? No  SOZO SCREENING: Patient was assessed today using the SOZO machine to determine the lymphedema index score. This was compared to her baseline score. It was determined that she is within the recommended range when compared to her baseline and no further action is needed at this time. She will continue SOZO screenings. These are done every 3 months for 2 years post operatively followed by every 6 months for 2 years, and then annually.   L-DEX FLOWSHEETS - 07/25/22 0900       L-DEX LYMPHEDEMA SCREENING   Measurement Type Unilateral    L-DEX MEASUREMENT EXTREMITY Upper Extremity    POSITION  Standing    DOMINANT SIDE Right    At Risk Side Left    BASELINE SCORE (UNILATERAL) -0.2    L-DEX SCORE (UNILATERAL) 0.9    VALUE CHANGE (UNILAT) 1.1               Hermenia Bers, PTA 07/25/2022, 9:12 AM

## 2022-08-01 ENCOUNTER — Encounter: Payer: Self-pay | Admitting: Nurse Practitioner

## 2022-08-05 ENCOUNTER — Other Ambulatory Visit: Payer: Self-pay | Admitting: Hematology

## 2022-08-09 ENCOUNTER — Telehealth: Payer: Self-pay | Admitting: Nurse Practitioner

## 2022-08-14 NOTE — Progress Notes (Signed)
Patient Care Team: Dulce Sellar, NP as PCP - General (Family Medicine) Pershing Proud, RN as Oncology Nurse Navigator Donnelly Angelica, RN as Oncology Nurse Navigator Almond Lint, MD as Consulting Physician (General Surgery) Malachy Mood, MD as Consulting Physician (Hematology) Dorothy Puffer, MD as Consulting Physician (Radiation Oncology) Adline Potter, NP as Nurse Practitioner (Obstetrics and Gynecology) Pollyann Samples, NP as Nurse Practitioner (Nurse Practitioner)   CHIEF COMPLAINT: Follow up rib pain, left breast cancer   Oncology History Overview Note   Cancer Staging  Malignant neoplasm of upper-outer quadrant of left breast in female, estrogen receptor positive (HCC) Staging form: Breast, AJCC 8th Edition - Clinical stage from 11/05/2020: Stage IIA (cT2, cN1, cM0, G1, ER+, PR+, HER2: Equivocal) - Signed by Malachy Mood, MD on 11/10/2020 - Pathologic stage from 01/06/2021: Stage IB (pT3, pN2a, cM0, G2, ER+, PR+, HER2-) - Signed by Malachy Mood, MD on 01/12/2021     Malignant neoplasm of upper-outer quadrant of left breast in female, estrogen receptor positive (HCC)  10/28/2020 Mammogram   Bilateral Diagnostic Mammogram; Left Breast Ultrasound  IMPRESSION: 1. Suspicious palpable mass/hypoechoic area in the left breast at 12 o'clock retroareolar measuring 4.5 cm.   2.  Suspicious lymph node in the left axilla.   11/05/2020 Cancer Staging   Staging form: Breast, AJCC 8th Edition - Clinical stage from 11/05/2020: Stage IIA (cT2, cN1, cM0, G1, ER+, PR+, HER2: Equivocal) - Signed by Malachy Mood, MD on 11/10/2020 Stage prefix: Initial diagnosis Histologic grading system: 3 grade system   11/05/2020 Pathology Results   Diagnosis 1. Breast, left, needle core biopsy, 12 o'clock subareolar, ribbon clip - INVASIVE MAMMARY CARCINOMA. SEE NOTE 1. Carcinoma measures 1.5 cm in greatest linear dimension and appears grade 2. 1. Immunohistochemical stain for E-cadherin is positive in the  tumor cells, consistent with a ductal phenotype. 1. PROGNOSTIC INDICATORS Results: The tumor cells are EQUIVOCAL for Her2 (2+). Her2 by FISH will be performed and results reported separately. Estrogen Receptor: 70%, POSITIVE, STRONG-MODERATE STAINING INTENSITY Progesterone Receptor: 80%, POSITIVE, MODERATE STAINING INTENSITY Proliferation Marker Ki67: 1%  2. Lymph node, needle/core biopsy, left axilla, tribell clip - METASTATIC CARCINOMA TO A LYMPH NODE. SEE NOTE 2. Largest contiguous focus of metastatic carcinoma measures 0.3 cm. 2. PROGNOSTIC INDICATORS Results: The tumor cells are EQUIVOCAL for Her2 (2+). Her2 by FISH will be performed and results reported separately. Estrogen Receptor: 90%, POSITIVE, STRONG STAINING INTENSITY Progesterone Receptor: 95%, POSITIVE, STRONG STAINING INTENSITY Proliferation Marker Ki67: 1%   11/10/2020 Initial Diagnosis   Malignant neoplasm of upper-outer quadrant of left breast in female, estrogen receptor positive (HCC)   11/19/2020 Imaging   EXAM: CT CHEST, ABDOMEN, AND PELVIS WITH CONTRAST  IMPRESSION: 1. Left breast mass  as previously described. 2. Enhancing subcentimeter left axillary lymph nodes. 3. No evidence of distal metastatic disease. 4. Small hiatal hernia   11/19/2020 Imaging   EXAM: NUCLEAR MEDICINE WHOLE BODY BONE SCAN  IMPRESSION: No evidence of metastatic disease.   11/21/2020 Imaging   EXAM: BILATERAL BREAST MRI WITH AND WITHOUT CONTRAST  IMPRESSION: 1. Suspicious 1.3 centimeter mass in the anterior UPPER central RIGHT breast warranting tissue diagnosis. (Image 65 of series 7). 2. 4 millimeter possible satellite nodule posterior to the 1.3 centimeter mass in the RIGHT breast. (Image 63 of series 7). 3. Indeterminate oval mass in the UPPER central middle depth of the RIGHT breast warranting tissue diagnosis. (Image 60 of series 7). 4. Large area of enhancement in the central portion of the  LEFT breast, involving all  quadrants. There is associated enhancement and retraction of the LEFT nipple, with significantly smaller size of the LEFT breast. Enhancement spans at least 6.5 centimeters. 5. Enlarged, previously biopsied LEFT axillary lymph node.   11/24/2020 Genetic Testing   Negative hereditary cancer genetic testing: no pathogenic variants detected in [facility] BRCAPlus STAT Panel or [employer] CustomNext-Cancer +RNAinsight Panel.  The report dates are November 24, 2020 and November 27, 2020, respectively.   The BRCAplus panel offered by [employer] and includes sequencing and deletion/duplication analysis for the following 8 genes: ATM, BRCA1, BRCA2, CDH1, CHEK2, PALB2, PTEN, and TP53.  The CustomNext-Cancer+RNAinsight panel offered by [employer] includes sequencing and rearrangement analysis for the following 47 genes:  APC, ATM, AXIN2, BARD1, BMPR1A, BRCA1, BRCA2, BRIP1, CDH1, CDK4, CDKN2A, CHEK2, DICER1, EPCAM, GREM1, HOXB13, MEN1, MLH1, MSH2, MSH3, MSH6, MUTYH, NBN, NF1, NF2, NTHL1, PALB2, PMS2, POLD1, POLE, PTEN, RAD51C, RAD51D, RECQL, RET, SDHA, SDHAF2, SDHB, SDHC, SDHD, SMAD4, SMARCA4, STK11, TP53, TSC1, TSC2, and VHL.  RNA data is routinely analyzed for use in variant interpretation for all genes.    11/27/2020 Pathology Results   Diagnosis Breast, right, needle core biopsy, anterior upper central - FIBROADENOMATOID AND FIBROCYSTIC CHANGES WITH CALCIFICATIONS - PSEUDOANGIOMATOUS STROMAL HYPERPLASIA - FOCAL PERIDUCTULAR CHRONIC INFLAMMATION - NO MALIGNANCY IDENTIFIED   01/06/2021 Cancer Staging   Staging form: Breast, AJCC 8th Edition - Pathologic stage from 01/06/2021: Stage IB (pT3, pN2a, cM0, G2, ER+, PR+, HER2-) - Signed by Malachy Mood, MD on 01/12/2021 Stage prefix: Initial diagnosis Histologic grading system: 3 grade system Residual tumor (R): R0 - None   01/06/2021 Definitive Surgery   FINAL MICROSCOPIC DIAGNOSIS:   A. LYMPH NODE, LEFT AXILLARY, SENTINEL, EXCISION:  - Metastatic carcinoma  to a lymph node (1/1)  - Focus of metastatic carcinoma measures 1.5 cm without evidence of extranodal extension   B. 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 BREAST, LEFT INFERIOLATERAL MARGIN, EXCISION:  - Benign fibroadipose tissue  - Negative for carcinoma   D. LYMPH NODE, LEFT AXILLARY #1, SENTINEL, EXCISION:  - Metastatic carcinoma to a lymph node (1/1)  - Focus of metastatic carcinoma measures 1.4 cm without evidence of extranodal extension   E. LYMPH NODE, LEFT AXILLARY #2, SENTINEL, EXCISION:  - Metastatic carcinoma to a lymph node (1/1)  - Focus of metastatic carcinoma measures 0.5 cm without evidence of extranodal extension   F. LYMPH NODE, LEFT AXILLARY #3, SENTINEL, EXCISION:  - Metastatic carcinoma to a lymph node (1/1)  - Focus of metastatic carcinoma measures 0.9 cm without evidence of extranodal extension   G. LYMPH NODE, LEFT AXILLARY #4, SENTINEL, EXCISION:  - Benign fibroadipose tissue, negative for carcinoma  - Lymphoid tissue is not identified    02/11/2021 - 06/24/2021 Chemotherapy   Patient is on Treatment Plan : BREAST ADJUVANT DOSE DENSE AC q14d / PACLitaxel q7d     06/16/2021 PET scan   IMPRESSION: 1. Postsurgical change of left mastectomy and left axillary lymph node dissection with tissue expander in place. Mild hypermetabolic activity about the surgical site without suspicious focal hypermetabolic soft tissue nodularity, favored to reflect postsurgical change.   2. No convincing scintigraphic evidence of hypermetabolic local recurrence or metastatic disease within the neck, chest, abdomen or pelvis.  CURRENT THERAPY: Verzenio 150 mg twice daily, monthly Zoladex injections, and daily exemestane which was changed to tamoxifen 04/08/22  INTERVAL HISTORY  Pamela Reyes returns for follow up as scheduled. Last seen by Dr. Mosetta Putt 05/27/22. She continues verzenio BID, daily tamoxifen, and monthly Zoladex. She was given gabapentin for left rib cage pain that was thought to be somewhat neuropathic. This has helped back pain but left rib pain has not improved.   ROS   Past Medical History:  Diagnosis Date   Anxiety    Axillary pain, right 01/18/2021   Will get right axillary ultrasound.   Breast cancer (HCC)    Breast pain, left 10/06/2020   Encounter for gynecological examination with Papanicolaou smear of cervix 02/10/2021   Encounter for screening fecal occult blood testing 02/10/2021   Family history of breast cancer 11/11/2020   Fibroid 10/27/2020   Has 1 x .9 x .6 cm subserosal fibroid   H/O left mastectomy    left mastectomy 2022   History of hiatal hernia    Left breast mass 10/06/2020   +breast cancer   LLQ pain 10/06/2020   PONV (postoperative nausea and vomiting)    Port-A-Cath in place 03/25/2021     Past Surgical History:  Procedure Laterality Date   AXILLARY LYMPH NODE DISSECTION Left 07/07/2021   Procedure: LEFT AXILLARY LYMPH NODE DISSECTION;  Surgeon: Almond Lint, MD;  Location: [facility];  Service: General;  Laterality: Left;   BREAST RECONSTRUCTION WITH PLACEMENT OF TISSUE EXPANDER AND FLEX HD (ACELLULAR HYDRATED DERMIS) Left 01/06/2021   Procedure: LEFT BREAST RECONSTRUCTION WITH PLACEMENT OF TISSUE EXPANDER AND ACELLULAR  DERMIS;  Surgeon: Glenna Fellows, MD;  Location: [facility];  Service: Plastics;  Laterality: Left;   MASTECTOMY W/ SENTINEL NODE BIOPSY Left 01/06/2021   Procedure: LEFT MASTECTOMY WITH MAGTRACE;  Surgeon: Almond Lint, MD;  Location: [facility];  Service: General;  Laterality: Left;   PORT-A-CATH REMOVAL Right 07/07/2021   Procedure: REMOVAL PORT-A-CATH;  Surgeon: Almond Lint, MD;  Location: [facility];  Service: General;  Laterality: Right;   PORTACATH PLACEMENT Right  01/19/2021   Procedure: INSERTION PORT-A-CATH;  Surgeon: Almond Lint, MD;  Location: [facility];  Service: General;  Laterality: Right;   RADIOACTIVE SEED GUIDED AXILLARY SENTINEL LYMPH NODE Left 01/06/2021   Procedure: RADIOACTIVE SEED GUIDED SENTINEL LYMPH NODE BIOPSY;  Surgeon: Almond Lint, MD;  Location: [facility];  Service: General;  Laterality: Left;   REMOVAL OF TISSUE EXPANDER AND PLACEMENT OF IMPLANT Left 02/18/2022   Procedure: REMOVAL OF TISSUE EXPANDER AND PLACEMENT OF IMPLANT;  Surgeon: Glenna Fellows, MD;  Location: [facility];  Service: Plastics;  Laterality: Left;   SENTINEL NODE BIOPSY Left 01/06/2021   Procedure: LEFT AXILLARY SENTINEL LYMPH  NODE BIOPSY;  Surgeon: Almond Lint, MD;  Location: [facility];  Service: General;  Laterality: Left;   WISDOM TOOTH EXTRACTION       Outpatient Encounter Medications as of 08/17/2022  Medication Sig   acetaminophen (TYLENOL) 500 MG tablet Take 500-1,000 mg by mouth every 6 (six) hours as needed for moderate pain or headache.   Biotin 1 MG CAPS Take by mouth.   Cholecalciferol (DIALYVITE VITAMIN D 5000) 125 MCG (5000 UT) capsule Take 5,000 Units by mouth daily. Vit D and K supplement combined   diazepam (VALIUM) 5 MG tablet Place 1 tablet vaginally nightly as needed for muscle spasm/ pelvic pain.   estradiol (ESTRACE) 0.1 MG/GM vaginal cream PLACE 0.5G NIGHTLY FOR TWO WEEKS  THEN TWICE A WEEK AFTER   gabapentin (NEURONTIN) 100 MG capsule Take 1 capsule (100 mg total) by mouth 3 (three) times daily.   hydrOXYzine (ATARAX) 10 MG tablet TAKE 1 TABLET (10 MG TOTAL) BY MOUTH DAILY AS NEEDED FOR ANXIETY, MAY TAKE 2 TIMES A DAY IF NEEDED   loratadine (CLARITIN) 10 MG tablet Take 10 mg by mouth daily.   omeprazole (PRILOSEC) 20 MG capsule Take 20 mg by mouth daily.   tamoxifen (NOLVADEX) 20 MG tablet TAKE 1 TABLET BY MOUTH EVERY DAY   tretinoin (RETIN-A) 0.025 % cream Apply 1 application. topically at bedtime.   VERZENIO 150 MG tablet  TAKE 1 TABLET BY MOUTH 2 TIMES A DAY   [DISCONTINUED] Investigational olanzapine/placebo 10 MG capsule URCC 16070 Blister Card 2 Take 1 capsule by mouth daily. Take in the morning on Days 2-4.   No facility-administered encounter medications on file as of 08/17/2022.     There were no vitals filed for this visit. There is no height or weight on file to calculate BMI.   PHYSICAL EXAM GENERAL:alert, no distress and comfortable SKIN: no rash  EYES: sclera clear NECK: without mass LYMPH:  no palpable cervical or supraclavicular lymphadenopathy  LUNGS: clear with normal breathing effort HEART: regular rate & rhythm, no lower extremity edema ABDOMEN: abdomen soft, non-tender and normal bowel sounds NEURO: alert & oriented x 3 with fluent speech, no focal motor/sensory deficits Breast exam:  PAC without erythema    CBC    Component Value Date/Time   WBC 3.3 (L) 05/27/2022 1338   WBC 2.6 (L) 02/16/2022 0949   RBC 3.99 05/27/2022 1338   HGB 12.8 05/27/2022 1338   HCT 37.0 05/27/2022 1338   PLT 203 05/27/2022 1338   MCV 92.7 05/27/2022 1338   MCH 32.1 05/27/2022 1338   MCHC 34.6 05/27/2022 1338   RDW 13.3 05/27/2022 1338   LYMPHSABS 1.2 05/27/2022 1338   MONOABS 0.2 05/27/2022 1338   EOSABS 0.0 05/27/2022 1338   BASOSABS 0.0 05/27/2022 1338     CMP     Component Value Date/Time   NA 139 05/27/2022 1338   K 4.7 05/27/2022 1338   CL 104 05/27/2022 1338   CO2 27 05/27/2022 1338   GLUCOSE 110 (H) 05/27/2022 1338   BUN 14 05/27/2022 1338   CREATININE 0.97 05/27/2022 1338   CALCIUM 9.8 05/27/2022 1338   PROT 7.0 05/27/2022 1338   ALBUMIN 4.3 05/27/2022 1338   AST 17 05/27/2022 1338   ALT 12 05/27/2022 1338   ALKPHOS 52 05/27/2022 1338   BILITOT 0.5 05/27/2022 1338   GFRNONAA >60 05/27/2022 1338     ASSESSMENT & PLAN:  PLAN:  No orders of the defined types were placed in this encounter.     All questions were answered. The patient knows to call the clinic with any  problems, questions or concerns. No barriers to learning were detected. I spent *** counseling the patient face to face. The total time spent in the appointment was *** and more than 50% was on counseling, review of test results, and coordination of care.   Santiago Glad, NP-C @DATE @

## 2022-08-17 ENCOUNTER — Encounter: Payer: Self-pay | Admitting: Nurse Practitioner

## 2022-08-17 ENCOUNTER — Other Ambulatory Visit: Payer: Self-pay

## 2022-08-17 ENCOUNTER — Inpatient Hospital Stay: Payer: BC Managed Care – PPO | Attending: Hematology

## 2022-08-17 ENCOUNTER — Inpatient Hospital Stay: Payer: BC Managed Care – PPO | Admitting: Nurse Practitioner

## 2022-08-17 VITALS — BP 131/82 | HR 94 | Temp 98.2°F | Resp 16 | Wt 117.9 lb

## 2022-08-17 DIAGNOSIS — C50412 Malignant neoplasm of upper-outer quadrant of left female breast: Secondary | ICD-10-CM | POA: Diagnosis present

## 2022-08-17 DIAGNOSIS — Z170 Estrogen receptor positive status [ER+]: Secondary | ICD-10-CM | POA: Diagnosis not present

## 2022-08-17 DIAGNOSIS — M25512 Pain in left shoulder: Secondary | ICD-10-CM | POA: Insufficient documentation

## 2022-08-17 DIAGNOSIS — R0781 Pleurodynia: Secondary | ICD-10-CM | POA: Insufficient documentation

## 2022-08-17 DIAGNOSIS — C773 Secondary and unspecified malignant neoplasm of axilla and upper limb lymph nodes: Secondary | ICD-10-CM | POA: Insufficient documentation

## 2022-08-17 DIAGNOSIS — F419 Anxiety disorder, unspecified: Secondary | ICD-10-CM | POA: Insufficient documentation

## 2022-08-17 DIAGNOSIS — Z79810 Long term (current) use of selective estrogen receptor modulators (SERMs): Secondary | ICD-10-CM | POA: Diagnosis not present

## 2022-08-17 DIAGNOSIS — Z923 Personal history of irradiation: Secondary | ICD-10-CM | POA: Insufficient documentation

## 2022-08-17 DIAGNOSIS — R0789 Other chest pain: Secondary | ICD-10-CM | POA: Insufficient documentation

## 2022-08-17 DIAGNOSIS — Z9012 Acquired absence of left breast and nipple: Secondary | ICD-10-CM | POA: Diagnosis not present

## 2022-08-17 DIAGNOSIS — R634 Abnormal weight loss: Secondary | ICD-10-CM | POA: Diagnosis not present

## 2022-08-17 LAB — CBC WITH DIFFERENTIAL (CANCER CENTER ONLY)
Abs Immature Granulocytes: 0.01 10*3/uL (ref 0.00–0.07)
Basophils Absolute: 0 10*3/uL (ref 0.0–0.1)
Basophils Relative: 1 %
Eosinophils Absolute: 0 10*3/uL (ref 0.0–0.5)
Eosinophils Relative: 0 %
HCT: 40.5 % (ref 36.0–46.0)
Hemoglobin: 13.7 g/dL (ref 12.0–15.0)
Immature Granulocytes: 0 %
Lymphocytes Relative: 43 %
Lymphs Abs: 1.2 10*3/uL (ref 0.7–4.0)
MCH: 31.9 pg (ref 26.0–34.0)
MCHC: 33.8 g/dL (ref 30.0–36.0)
MCV: 94.4 fL (ref 80.0–100.0)
Monocytes Absolute: 0.3 10*3/uL (ref 0.1–1.0)
Monocytes Relative: 9 %
Neutro Abs: 1.3 10*3/uL — ABNORMAL LOW (ref 1.7–7.7)
Neutrophils Relative %: 47 %
Platelet Count: 176 10*3/uL (ref 150–400)
RBC: 4.29 MIL/uL (ref 3.87–5.11)
RDW: 12.9 % (ref 11.5–15.5)
WBC Count: 2.8 10*3/uL — ABNORMAL LOW (ref 4.0–10.5)
nRBC: 0 % (ref 0.0–0.2)

## 2022-08-17 LAB — CMP (CANCER CENTER ONLY)
ALT: 12 U/L (ref 0–44)
AST: 16 U/L (ref 15–41)
Albumin: 4.3 g/dL (ref 3.5–5.0)
Alkaline Phosphatase: 43 U/L (ref 38–126)
Anion gap: 7 (ref 5–15)
BUN: 17 mg/dL (ref 6–20)
CO2: 29 mmol/L (ref 22–32)
Calcium: 10.2 mg/dL (ref 8.9–10.3)
Chloride: 105 mmol/L (ref 98–111)
Creatinine: 0.9 mg/dL (ref 0.44–1.00)
GFR, Estimated: >60 mL/min (ref >60)
Glucose, Bld: 80 mg/dL (ref 70–99)
Potassium: 4.6 mmol/L (ref 3.5–5.1)
Sodium: 141 mmol/L (ref 135–145)
Total Bilirubin: 0.5 mg/dL (ref 0.3–1.2)
Total Protein: 7.5 g/dL (ref 6.5–8.1)

## 2022-08-18 LAB — CANCER ANTIGEN 27.29: CA 27.29: 20.5 U/mL (ref 0.0–38.6)

## 2022-08-25 ENCOUNTER — Telehealth: Payer: Self-pay | Admitting: Nurse Practitioner

## 2022-08-26 ENCOUNTER — Other Ambulatory Visit: Payer: BC Managed Care – PPO

## 2022-08-26 ENCOUNTER — Ambulatory Visit: Payer: BC Managed Care – PPO | Admitting: Hematology

## 2022-08-30 ENCOUNTER — Ambulatory Visit (HOSPITAL_COMMUNITY)
Admission: RE | Admit: 2022-08-30 | Discharge: 2022-08-30 | Disposition: A | Discharge status: Home / Self Care | Payer: BC Managed Care – PPO | Source: Ambulatory Visit | Attending: Nurse Practitioner | Admitting: Nurse Practitioner

## 2022-08-30 DIAGNOSIS — Z170 Estrogen receptor positive status [ER+]: Secondary | ICD-10-CM | POA: Insufficient documentation

## 2022-08-30 DIAGNOSIS — C50412 Malignant neoplasm of upper-outer quadrant of left female breast: Secondary | ICD-10-CM | POA: Diagnosis not present

## 2022-08-30 MED ORDER — GADOBUTROL 1 MMOL/ML IV SOLN
5.0000 mL | Freq: Once | INTRAVENOUS | Status: AC | PRN
  Administered 2022-08-30: 5 mL via INTRAVENOUS

## 2022-09-02 ENCOUNTER — Ambulatory Visit (HOSPITAL_COMMUNITY)
Admission: RE | Admit: 2022-09-02 | Discharge: 2022-09-02 | Disposition: A | Discharge status: Home / Self Care | Payer: BC Managed Care – PPO | Source: Ambulatory Visit | Attending: Nurse Practitioner | Admitting: Nurse Practitioner

## 2022-09-02 DIAGNOSIS — R634 Abnormal weight loss: Secondary | ICD-10-CM

## 2022-09-02 DIAGNOSIS — R0789 Other chest pain: Secondary | ICD-10-CM | POA: Diagnosis present

## 2022-09-02 DIAGNOSIS — Z170 Estrogen receptor positive status [ER+]: Secondary | ICD-10-CM | POA: Diagnosis present

## 2022-09-02 DIAGNOSIS — C50412 Malignant neoplasm of upper-outer quadrant of left female breast: Secondary | ICD-10-CM | POA: Insufficient documentation

## 2022-09-02 MED ORDER — IOHEXOL 300 MG/ML  SOLN
100.0000 mL | Freq: Once | INTRAMUSCULAR | Status: AC | PRN
  Administered 2022-09-02: 100 mL via INTRAVENOUS

## 2022-09-02 MED ORDER — TECHNETIUM TC 99M MEDRONATE IV KIT
20.0000 | PACK | Freq: Once | INTRAVENOUS | Status: AC | PRN
  Administered 2022-09-02: 21 via INTRAVENOUS

## 2022-09-02 MED ORDER — SODIUM CHLORIDE (PF) 0.9 % IJ SOLN
INTRAMUSCULAR | Status: AC
  Filled 2022-09-02: qty 50

## 2022-09-04 NOTE — Progress Notes (Deleted)
Patient Care Team: Dulce Sellar, NP as PCP - General (Family Medicine) Pershing Proud, RN as Oncology Nurse Navigator Donnelly Angelica, RN as Oncology Nurse Navigator Almond Lint, MD as Consulting Physician (General Surgery) Malachy Mood, MD as Consulting Physician (Hematology) Dorothy Puffer, MD as Consulting Physician (Radiation Oncology) Adline Potter, NP as Nurse Practitioner (Obstetrics and Gynecology) Pollyann Samples, NP as Nurse Practitioner (Nurse Practitioner)   CHIEF COMPLAINT: Follow up left chest wall/rib pain, h/o left breast cancer   Oncology History Overview Note   Cancer Staging  Malignant neoplasm of upper-outer quadrant of left breast in female, estrogen receptor positive (HCC) Staging form: Breast, AJCC 8th Edition - Clinical stage from 11/05/2020: Stage IIA (cT2, cN1, cM0, G1, ER+, PR+, HER2: Equivocal) - Signed by Malachy Mood, MD on 11/10/2020 - Pathologic stage from 01/06/2021: Stage IB (pT3, pN2a, cM0, G2, ER+, PR+, HER2-) - Signed by Malachy Mood, MD on 01/12/2021     Malignant neoplasm of upper-outer quadrant of left breast in female, estrogen receptor positive (HCC)  10/28/2020 Mammogram   Bilateral Diagnostic Mammogram; Left Breast Ultrasound  IMPRESSION: 1. Suspicious palpable mass/hypoechoic area in the left breast at 12 o'clock retroareolar measuring 4.5 cm.   2.  Suspicious lymph node in the left axilla.   11/05/2020 Cancer Staging   Staging form: Breast, AJCC 8th Edition - Clinical stage from 11/05/2020: Stage IIA (cT2, cN1, cM0, G1, ER+, PR+, HER2: Equivocal) - Signed by Malachy Mood, MD on 11/10/2020 Stage prefix: Initial diagnosis Histologic grading system: 3 grade system   11/05/2020 Pathology Results   Diagnosis 1. Breast, left, needle core biopsy, 12 o'clock subareolar, ribbon clip - INVASIVE MAMMARY CARCINOMA. SEE NOTE 1. Carcinoma measures 1.5 cm in greatest linear dimension and appears grade 2. 1. Immunohistochemical stain for  E-cadherin is positive in the tumor cells, consistent with a ductal phenotype. 1. PROGNOSTIC INDICATORS Results: The tumor cells are EQUIVOCAL for Her2 (2+). Her2 by FISH will be performed and results reported separately. Estrogen Receptor: 70%, POSITIVE, STRONG-MODERATE STAINING INTENSITY Progesterone Receptor: 80%, POSITIVE, MODERATE STAINING INTENSITY Proliferation Marker Ki67: 1%  2. Lymph node, needle/core biopsy, left axilla, tribell clip - METASTATIC CARCINOMA TO A LYMPH NODE. SEE NOTE 2. Largest contiguous focus of metastatic carcinoma measures 0.3 cm. 2. PROGNOSTIC INDICATORS Results: The tumor cells are EQUIVOCAL for Her2 (2+). Her2 by FISH will be performed and results reported separately. Estrogen Receptor: 90%, POSITIVE, STRONG STAINING INTENSITY Progesterone Receptor: 95%, POSITIVE, STRONG STAINING INTENSITY Proliferation Marker Ki67: 1%   11/10/2020 Initial Diagnosis   Malignant neoplasm of upper-outer quadrant of left breast in female, estrogen receptor positive (HCC)   11/19/2020 Imaging   EXAM: CT CHEST, ABDOMEN, AND PELVIS WITH CONTRAST  IMPRESSION: 1. Left breast mass  as previously described. 2. Enhancing subcentimeter left axillary lymph nodes. 3. No evidence of distal metastatic disease. 4. Small hiatal hernia   11/19/2020 Imaging   EXAM: NUCLEAR MEDICINE WHOLE BODY BONE SCAN  IMPRESSION: No evidence of metastatic disease.   11/21/2020 Imaging   EXAM: BILATERAL BREAST MRI WITH AND WITHOUT CONTRAST  IMPRESSION: 1. Suspicious 1.3 centimeter mass in the anterior UPPER central RIGHT breast warranting tissue diagnosis. (Image 65 of series 7). 2. 4 millimeter possible satellite nodule posterior to the 1.3 centimeter mass in the RIGHT breast. (Image 63 of series 7). 3. Indeterminate oval mass in the UPPER central middle depth of the RIGHT breast warranting tissue diagnosis. (Image 60 of series 7). 4. Large area of enhancement in the central  portion of  the LEFT breast, involving all quadrants. There is associated enhancement and retraction of the LEFT nipple, with significantly smaller size of the LEFT breast. Enhancement spans at least 6.5 centimeters. 5. Enlarged, previously biopsied LEFT axillary lymph node.   11/24/2020 Genetic Testing   Negative hereditary cancer genetic testing: no pathogenic variants detected in [facility] BRCAPlus STAT Panel or [employer] CustomNext-Cancer +RNAinsight Panel.  The report dates are November 24, 2020 and November 27, 2020, respectively.   The BRCAplus panel offered by [employer] and includes sequencing and deletion/duplication analysis for the following 8 genes: ATM, BRCA1, BRCA2, CDH1, CHEK2, PALB2, PTEN, and TP53.  The CustomNext-Cancer+RNAinsight panel offered by [employer] includes sequencing and rearrangement analysis for the following 47 genes:  APC, ATM, AXIN2, BARD1, BMPR1A, BRCA1, BRCA2, BRIP1, CDH1, CDK4, CDKN2A, CHEK2, DICER1, EPCAM, GREM1, HOXB13, MEN1, MLH1, MSH2, MSH3, MSH6, MUTYH, NBN, NF1, NF2, NTHL1, PALB2, PMS2, POLD1, POLE, PTEN, RAD51C, RAD51D, RECQL, RET, SDHA, SDHAF2, SDHB, SDHC, SDHD, SMAD4, SMARCA4, STK11, TP53, TSC1, TSC2, and VHL.  RNA data is routinely analyzed for use in variant interpretation for all genes.    11/27/2020 Pathology Results   Diagnosis Breast, right, needle core biopsy, anterior upper central - FIBROADENOMATOID AND FIBROCYSTIC CHANGES WITH CALCIFICATIONS - PSEUDOANGIOMATOUS STROMAL HYPERPLASIA - FOCAL PERIDUCTULAR CHRONIC INFLAMMATION - NO MALIGNANCY IDENTIFIED   01/06/2021 Cancer Staging   Staging form: Breast, AJCC 8th Edition - Pathologic stage from 01/06/2021: Stage IB (pT3, pN2a, cM0, G2, ER+, PR+, HER2-) - Signed by Malachy Mood, MD on 01/12/2021 Stage prefix: Initial diagnosis Histologic grading system: 3 grade system Residual tumor (R): R0 - None   01/06/2021 Definitive Surgery   FINAL MICROSCOPIC DIAGNOSIS:   A. 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 BREAST, LEFT INFERIOLATERAL MARGIN, EXCISION:  - Benign fibroadipose tissue  - Negative for carcinoma   D. LYMPH NODE, LEFT AXILLARY #1, SENTINEL, EXCISION:  - Metastatic carcinoma to a lymph node (1/1)  - Focus of metastatic carcinoma measures 1.4 cm without evidence of extranodal extension   E. LYMPH NODE, LEFT AXILLARY #2, SENTINEL, EXCISION:  - Metastatic carcinoma to a lymph node (1/1)  - Focus of metastatic carcinoma measures 0.5 cm without evidence of extranodal extension   F. LYMPH NODE, LEFT AXILLARY #3, SENTINEL, EXCISION:  - Metastatic carcinoma to a lymph node (1/1)  - Focus of metastatic carcinoma measures 0.9 cm without evidence of extranodal extension   G. LYMPH NODE, LEFT AXILLARY #4, SENTINEL, EXCISION:  - Benign fibroadipose tissue, negative for carcinoma  - Lymphoid tissue is not identified    02/11/2021 - 06/24/2021 Chemotherapy   Patient is on Treatment Plan : BREAST ADJUVANT DOSE DENSE AC q14d / PACLitaxel q7d     06/16/2021 PET scan   IMPRESSION: 1. Postsurgical change of left mastectomy and left axillary lymph node dissection with tissue expander in place. Mild hypermetabolic activity about the surgical site without suspicious focal hypermetabolic soft tissue nodularity, favored to reflect postsurgical change.   2. No convincing scintigraphic evidence of hypermetabolic local recurrence or metastatic disease within the neck, chest, abdomen or pelvis.  CURRENT THERAPY: Anti-estrogen and Verzenio  INTERVAL HISTORY Pamela Reyes returns for follow up to review CT,  specifically which shows no abnormality at the site of her left chest wall/low rib pain.   ROS   Past Medical History:  Diagnosis Date   Anxiety    Axillary pain, right 01/18/2021   Will get right axillary ultrasound.   Breast cancer (HCC)    Breast pain, left 10/06/2020   Encounter for gynecological examination with Papanicolaou smear of cervix 02/10/2021   Encounter for screening fecal occult blood testing 02/10/2021   Family history of breast cancer 11/11/2020   Fibroid 10/27/2020   Has 1 x .9 x .6 cm subserosal fibroid   H/O left mastectomy    left mastectomy 2022   History of hiatal hernia    Left breast mass 10/06/2020   +breast cancer   LLQ pain 10/06/2020   PONV (postoperative nausea and vomiting)    Port-A-Cath in place 03/25/2021     Past Surgical History:  Procedure Laterality Date   AXILLARY LYMPH NODE DISSECTION Left 07/07/2021   Procedure: LEFT AXILLARY LYMPH NODE DISSECTION;  Surgeon: Almond Lint, MD;  Location: [facility];  Service: General;  Laterality: Left;   BREAST RECONSTRUCTION WITH PLACEMENT OF TISSUE EXPANDER AND FLEX HD (ACELLULAR HYDRATED DERMIS) Left 01/06/2021   Procedure: LEFT BREAST RECONSTRUCTION WITH PLACEMENT OF TISSUE EXPANDER AND ACELLULAR  DERMIS;  Surgeon: Glenna Fellows, MD;  Location: [facility];  Service: Plastics;  Laterality: Left;   MASTECTOMY W/ SENTINEL NODE BIOPSY Left 01/06/2021   Procedure: LEFT MASTECTOMY WITH MAGTRACE;  Surgeon: Almond Lint, MD;  Location: [facility];  Service: General;  Laterality: Left;   PORT-A-CATH REMOVAL Right 07/07/2021   Procedure: REMOVAL PORT-A-CATH;  Surgeon: Almond Lint, MD;  Location: [facility];  Service: General;  Laterality: Right;   PORTACATH PLACEMENT Right 01/19/2021   Procedure: INSERTION PORT-A-CATH;  Surgeon: Almond Lint, MD;  Location: [facility];  Service: General;  Laterality: Right;   RADIOACTIVE SEED GUIDED AXILLARY SENTINEL LYMPH NODE Left 01/06/2021    Procedure: RADIOACTIVE SEED GUIDED SENTINEL LYMPH NODE BIOPSY;  Surgeon: Almond Lint, MD;  Location: [facility];  Service: General;  Laterality: Left;   REMOVAL OF TISSUE EXPANDER AND PLACEMENT OF IMPLANT Left 02/18/2022   Procedure: REMOVAL OF TISSUE EXPANDER AND PLACEMENT OF IMPLANT;  Surgeon: Glenna Fellows, MD;  Location: [facility];  Service: Plastics;  Laterality: Left;   SENTINEL NODE BIOPSY Left 01/06/2021   Procedure: LEFT AXILLARY SENTINEL LYMPH  NODE BIOPSY;  Surgeon: Almond Lint, MD;  Location: [facility];  Service: General;  Laterality: Left;   WISDOM TOOTH EXTRACTION       Outpatient Encounter Medications as of 09/07/2022  Medication Sig   acetaminophen (TYLENOL) 500 MG tablet Take 500-1,000 mg by mouth every 6 (six) hours as needed for moderate pain or headache.   Biotin 1 MG CAPS Take by mouth.   Cholecalciferol (DIALYVITE VITAMIN D 5000) 125 MCG (5000 UT) capsule Take 5,000 Units by mouth daily. Vit D and K supplement combined   diazepam (VALIUM) 5 MG tablet Place 1 tablet vaginally nightly as needed for muscle spasm/ pelvic pain.   estradiol (ESTRACE) 0.1 MG/GM vaginal cream PLACE 0.5G NIGHTLY FOR TWO WEEKS THEN TWICE A WEEK AFTER   gabapentin (NEURONTIN) 100 MG capsule Take 1 capsule (100 mg total) by mouth 3 (three) times daily.   hydrOXYzine (ATARAX) 10 MG tablet TAKE 1 TABLET (10 MG TOTAL) BY MOUTH DAILY AS  NEEDED FOR ANXIETY, MAY TAKE 2 TIMES A DAY IF NEEDED   loratadine (CLARITIN) 10 MG tablet Take 10 mg by mouth daily.   omeprazole (PRILOSEC) 20 MG capsule Take 20 mg by mouth daily.   tamoxifen (NOLVADEX) 20 MG tablet TAKE 1 TABLET BY MOUTH EVERY DAY   tretinoin (RETIN-A) 0.025 % cream Apply 1 application. topically at bedtime.   VERZENIO 150 MG tablet TAKE 1 TABLET BY MOUTH 2 TIMES A DAY   [DISCONTINUED] Investigational olanzapine/placebo 10 MG capsule URCC 16070 Blister Card 2 Take 1 capsule by mouth daily. Take in the morning on Days 2-4.   No  facility-administered encounter medications on file as of 09/07/2022.     There were no vitals filed for this visit. There is no height or weight on file to calculate BMI.   PHYSICAL EXAM GENERAL:alert, no distress and comfortable SKIN: no rash  EYES: sclera clear NECK: without mass LYMPH:  no palpable cervical or supraclavicular lymphadenopathy  LUNGS: clear with normal breathing effort HEART: regular rate & rhythm, no lower extremity edema ABDOMEN: abdomen soft, non-tender and normal bowel sounds NEURO: alert & oriented x 3 with fluent speech, no focal motor/sensory deficits Breast exam:  PAC without erythema    CBC    Component Value Date/Time   WBC 2.8 (L) 08/17/2022 0852   WBC 2.6 (L) 02/16/2022 0949   RBC 4.29 08/17/2022 0852   HGB 13.7 08/17/2022 0852   HCT 40.5 08/17/2022 0852   PLT 176 08/17/2022 0852   MCV 94.4 08/17/2022 0852   MCH 31.9 08/17/2022 0852   MCHC 33.8 08/17/2022 0852   RDW 12.9 08/17/2022 0852   LYMPHSABS 1.2 08/17/2022 0852   MONOABS 0.3 08/17/2022 0852   EOSABS 0.0 08/17/2022 0852   BASOSABS 0.0 08/17/2022 0852     CMP     Component Value Date/Time   NA 141 08/17/2022 0852   K 4.6 08/17/2022 0852   CL 105 08/17/2022 0852   CO2 29 08/17/2022 0852   GLUCOSE 80 08/17/2022 0852   BUN 17 08/17/2022 0852   CREATININE 0.90 08/17/2022 0852   CALCIUM 10.2 08/17/2022 0852   PROT 7.5 08/17/2022 0852   ALBUMIN 4.3 08/17/2022 0852   AST 16 08/17/2022 0852   ALT 12 08/17/2022 0852   ALKPHOS 43 08/17/2022 0852   BILITOT 0.5 08/17/2022 0852   GFRNONAA >60 08/17/2022 0852     ASSESSMENT & PLAN:  PLAN:  No orders of the defined types were placed in this encounter.     All questions were answered. The patient knows to call the clinic with any problems, questions or concerns. No barriers to learning were detected. I spent *** counseling the patient face to face. The total time spent in the appointment was *** and more than 50% was on counseling,  review of test results, and coordination of care.   Pamela Glad, NP-C @DATE @

## 2022-09-06 ENCOUNTER — Other Ambulatory Visit: Payer: Self-pay | Admitting: Hematology

## 2022-09-06 ENCOUNTER — Telehealth: Payer: Self-pay | Admitting: Nurse Practitioner

## 2022-09-06 NOTE — Telephone Encounter (Signed)
Patient called to reschedule appointment; patient is aware of rescheduled appointment dates/times

## 2022-09-07 ENCOUNTER — Ambulatory Visit: Payer: BC Managed Care – PPO | Admitting: Nurse Practitioner

## 2022-09-11 NOTE — Progress Notes (Unsigned)
Patient Care Team: Dulce Sellar, NP as PCP - General (Family Medicine) Pershing Proud, RN as Oncology Nurse Navigator Donnelly Angelica, RN as Oncology Nurse Navigator Almond Lint, MD as Consulting Physician (General Surgery) Malachy Mood, MD as Consulting Physician (Hematology) Dorothy Puffer, MD as Consulting Physician (Radiation Oncology) Adline Potter, NP as Nurse Practitioner (Obstetrics and Gynecology) Pollyann Samples, NP as Nurse Practitioner (Nurse Practitioner)   CHIEF COMPLAINT: Follow up left rib/chest wall pain; h/o left breast cancer   Oncology History Overview Note   Cancer Staging  Malignant neoplasm of upper-outer quadrant of left breast in female, estrogen receptor positive (HCC) Staging form: Breast, AJCC 8th Edition - Clinical stage from 11/05/2020: Stage IIA (cT2, cN1, cM0, G1, ER+, PR+, HER2: Equivocal) - Signed by Malachy Mood, MD on 11/10/2020 - Pathologic stage from 01/06/2021: Stage IB (pT3, pN2a, cM0, G2, ER+, PR+, HER2-) - Signed by Malachy Mood, MD on 01/12/2021     Malignant neoplasm of upper-outer quadrant of left breast in female, estrogen receptor positive (HCC)  10/28/2020 Mammogram   Bilateral Diagnostic Mammogram; Left Breast Ultrasound  IMPRESSION: 1. Suspicious palpable mass/hypoechoic area in the left breast at 12 o'clock retroareolar measuring 4.5 cm.   2.  Suspicious lymph node in the left axilla.   11/05/2020 Cancer Staging   Staging form: Breast, AJCC 8th Edition - Clinical stage from 11/05/2020: Stage IIA (cT2, cN1, cM0, G1, ER+, PR+, HER2: Equivocal) - Signed by Malachy Mood, MD on 11/10/2020 Stage prefix: Initial diagnosis Histologic grading system: 3 grade system   11/05/2020 Pathology Results   Diagnosis 1. Breast, left, needle core biopsy, 12 o'clock subareolar, ribbon clip - INVASIVE MAMMARY CARCINOMA. SEE NOTE 1. Carcinoma measures 1.5 cm in greatest linear dimension and appears grade 2. 1. Immunohistochemical stain for  E-cadherin is positive in the tumor cells, consistent with a ductal phenotype. 1. PROGNOSTIC INDICATORS Results: The tumor cells are EQUIVOCAL for Her2 (2+). Her2 by FISH will be performed and results reported separately. Estrogen Receptor: 70%, POSITIVE, STRONG-MODERATE STAINING INTENSITY Progesterone Receptor: 80%, POSITIVE, MODERATE STAINING INTENSITY Proliferation Marker Ki67: 1%  2. Lymph node, needle/core biopsy, left axilla, tribell clip - METASTATIC CARCINOMA TO A LYMPH NODE. SEE NOTE 2. Largest contiguous focus of metastatic carcinoma measures 0.3 cm. 2. PROGNOSTIC INDICATORS Results: The tumor cells are EQUIVOCAL for Her2 (2+). Her2 by FISH will be performed and results reported separately. Estrogen Receptor: 90%, POSITIVE, STRONG STAINING INTENSITY Progesterone Receptor: 95%, POSITIVE, STRONG STAINING INTENSITY Proliferation Marker Ki67: 1%   11/10/2020 Initial Diagnosis   Malignant neoplasm of upper-outer quadrant of left breast in female, estrogen receptor positive (HCC)   11/19/2020 Imaging   EXAM: CT CHEST, ABDOMEN, AND PELVIS WITH CONTRAST  IMPRESSION: 1. Left breast mass  as previously described. 2. Enhancing subcentimeter left axillary lymph nodes. 3. No evidence of distal metastatic disease. 4. Small hiatal hernia   11/19/2020 Imaging   EXAM: NUCLEAR MEDICINE WHOLE BODY BONE SCAN  IMPRESSION: No evidence of metastatic disease.   11/21/2020 Imaging   EXAM: BILATERAL BREAST MRI WITH AND WITHOUT CONTRAST  IMPRESSION: 1. Suspicious 1.3 centimeter mass in the anterior UPPER central RIGHT breast warranting tissue diagnosis. (Image 65 of series 7). 2. 4 millimeter possible satellite nodule posterior to the 1.3 centimeter mass in the RIGHT breast. (Image 63 of series 7). 3. Indeterminate oval mass in the UPPER central middle depth of the RIGHT breast warranting tissue diagnosis. (Image 60 of series 7). 4. Large area of enhancement in the central  portion of  the LEFT breast, involving all quadrants. There is associated enhancement and retraction of the LEFT nipple, with significantly smaller size of the LEFT breast. Enhancement spans at least 6.5 centimeters. 5. Enlarged, previously biopsied LEFT axillary lymph node.   11/24/2020 Genetic Testing   Negative hereditary cancer genetic testing: no pathogenic variants detected in [facility] BRCAPlus STAT Panel or [employer] CustomNext-Cancer +RNAinsight Panel.  The report dates are November 24, 2020 and November 27, 2020, respectively.   The BRCAplus panel offered by [employer] and includes sequencing and deletion/duplication analysis for the following 8 genes: ATM, BRCA1, BRCA2, CDH1, CHEK2, PALB2, PTEN, and TP53.  The CustomNext-Cancer+RNAinsight panel offered by [employer] includes sequencing and rearrangement analysis for the following 47 genes:  APC, ATM, AXIN2, BARD1, BMPR1A, BRCA1, BRCA2, BRIP1, CDH1, CDK4, CDKN2A, CHEK2, DICER1, EPCAM, GREM1, HOXB13, MEN1, MLH1, MSH2, MSH3, MSH6, MUTYH, NBN, NF1, NF2, NTHL1, PALB2, PMS2, POLD1, POLE, PTEN, RAD51C, RAD51D, RECQL, RET, SDHA, SDHAF2, SDHB, SDHC, SDHD, SMAD4, SMARCA4, STK11, TP53, TSC1, TSC2, and VHL.  RNA data is routinely analyzed for use in variant interpretation for all genes.    11/27/2020 Pathology Results   Diagnosis Breast, right, needle core biopsy, anterior upper central - FIBROADENOMATOID AND FIBROCYSTIC CHANGES WITH CALCIFICATIONS - PSEUDOANGIOMATOUS STROMAL HYPERPLASIA - FOCAL PERIDUCTULAR CHRONIC INFLAMMATION - NO MALIGNANCY IDENTIFIED   01/06/2021 Cancer Staging   Staging form: Breast, AJCC 8th Edition - Pathologic stage from 01/06/2021: Stage IB (pT3, pN2a, cM0, G2, ER+, PR+, HER2-) - Signed by Malachy Mood, MD on 01/12/2021 Stage prefix: Initial diagnosis Histologic grading system: 3 grade system Residual tumor (R): R0 - None   01/06/2021 Definitive Surgery   FINAL MICROSCOPIC DIAGNOSIS:   A. 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 BREAST, LEFT INFERIOLATERAL MARGIN, EXCISION:  - Benign fibroadipose tissue  - Negative for carcinoma   D. LYMPH NODE, LEFT AXILLARY #1, SENTINEL, EXCISION:  - Metastatic carcinoma to a lymph node (1/1)  - Focus of metastatic carcinoma measures 1.4 cm without evidence of extranodal extension   E. LYMPH NODE, LEFT AXILLARY #2, SENTINEL, EXCISION:  - Metastatic carcinoma to a lymph node (1/1)  - Focus of metastatic carcinoma measures 0.5 cm without evidence of extranodal extension   F. LYMPH NODE, LEFT AXILLARY #3, SENTINEL, EXCISION:  - Metastatic carcinoma to a lymph node (1/1)  - Focus of metastatic carcinoma measures 0.9 cm without evidence of extranodal extension   G. LYMPH NODE, LEFT AXILLARY #4, SENTINEL, EXCISION:  - Benign fibroadipose tissue, negative for carcinoma  - Lymphoid tissue is not identified    02/11/2021 - 06/24/2021 Chemotherapy   Patient is on Treatment Plan : BREAST ADJUVANT DOSE DENSE AC q14d / PACLitaxel q7d     06/16/2021 PET scan   IMPRESSION: 1. Postsurgical change of left mastectomy and left axillary lymph node dissection with tissue expander in place. Mild hypermetabolic activity about the surgical site without suspicious focal hypermetabolic soft tissue nodularity, favored to reflect postsurgical change.   2. No convincing scintigraphic evidence of hypermetabolic local recurrence or metastatic disease within the neck, chest, abdomen or pelvis.  CURRENT THERAPY: Verzenio 150 mg twice daily, monthly Zoladex injections, and daily exemestane which was changed to  tamoxifen 04/08/22   INTERVAL HISTORY Pamela Reyes returns for follow up as scheduled. Last seen by me 08/17/22. In the interim she held Tamoxifen and underwent CT/bone scan and is here for results.   ROS   Past Medical History:  Diagnosis Date   Anxiety    Axillary pain, right 01/18/2021   Will get right axillary ultrasound.   Breast cancer (HCC)    Breast pain, left 10/06/2020   Encounter for gynecological examination with Papanicolaou smear of cervix 02/10/2021   Encounter for screening fecal occult blood testing 02/10/2021   Family history of breast cancer 11/11/2020   Fibroid 10/27/2020   Has 1 x .9 x .6 cm subserosal fibroid   H/O left mastectomy    left mastectomy 2022   History of hiatal hernia    Left breast mass 10/06/2020   +breast cancer   LLQ pain 10/06/2020   PONV (postoperative nausea and vomiting)    Port-A-Cath in place 03/25/2021     Past Surgical History:  Procedure Laterality Date   AXILLARY LYMPH NODE DISSECTION Left 07/07/2021   Procedure: LEFT AXILLARY LYMPH NODE DISSECTION;  Surgeon: Almond Lint, MD;  Location: [facility];  Service: General;  Laterality: Left;   BREAST RECONSTRUCTION WITH PLACEMENT OF TISSUE EXPANDER AND FLEX HD (ACELLULAR HYDRATED DERMIS) Left 01/06/2021   Procedure: LEFT BREAST RECONSTRUCTION WITH PLACEMENT OF TISSUE EXPANDER AND ACELLULAR  DERMIS;  Surgeon: Glenna Fellows, MD;  Location: [facility];  Service: Plastics;  Laterality: Left;   MASTECTOMY W/ SENTINEL NODE BIOPSY Left 01/06/2021   Procedure: LEFT MASTECTOMY WITH MAGTRACE;  Surgeon: Almond Lint, MD;  Location: [facility];  Service: General;  Laterality: Left;   PORT-A-CATH REMOVAL Right 07/07/2021   Procedure: REMOVAL PORT-A-CATH;  Surgeon: Almond Lint, MD;  Location: [facility];  Service: General;  Laterality: Right;   PORTACATH PLACEMENT Right 01/19/2021   Procedure: INSERTION PORT-A-CATH;  Surgeon: Almond Lint, MD;  Location: [facility];  Service:  General;  Laterality: Right;   RADIOACTIVE SEED GUIDED AXILLARY SENTINEL LYMPH NODE Left 01/06/2021   Procedure: RADIOACTIVE SEED GUIDED SENTINEL LYMPH NODE BIOPSY;  Surgeon: Almond Lint, MD;  Location: [facility];  Service: General;  Laterality: Left;   REMOVAL OF TISSUE EXPANDER AND PLACEMENT OF IMPLANT Left 02/18/2022   Procedure: REMOVAL OF TISSUE EXPANDER AND PLACEMENT OF IMPLANT;  Surgeon: Glenna Fellows, MD;  Location: [facility];  Service: Plastics;  Laterality: Left;   SENTINEL NODE BIOPSY Left 01/06/2021   Procedure: LEFT AXILLARY SENTINEL LYMPH  NODE BIOPSY;  Surgeon: Almond Lint, MD;  Location: [facility];  Service: General;  Laterality: Left;   WISDOM TOOTH EXTRACTION       Outpatient Encounter Medications as of 09/12/2022  Medication Sig   acetaminophen (TYLENOL) 500 MG tablet Take 500-1,000 mg by mouth every 6 (six) hours as needed for moderate pain or headache.   Biotin 1 MG CAPS Take by mouth.   Cholecalciferol (DIALYVITE VITAMIN D 5000) 125 MCG (5000 UT) capsule Take 5,000 Units by mouth daily. Vit D and K supplement combined   diazepam (VALIUM) 5 MG tablet Place 1 tablet vaginally nightly as needed for muscle spasm/ pelvic pain.   estradiol (ESTRACE) 0.1 MG/GM vaginal cream PLACE 0.5G NIGHTLY FOR TWO WEEKS THEN TWICE A WEEK AFTER   gabapentin (NEURONTIN) 100 MG capsule Take 1 capsule (100 mg total) by mouth 3 (three)  times daily.   hydrOXYzine (ATARAX) 10 MG tablet TAKE 1 TABLET (10 MG TOTAL) BY MOUTH DAILY AS NEEDED FOR ANXIETY, MAY TAKE 2 TIMES A DAY IF NEEDED   loratadine (CLARITIN) 10 MG tablet Take 10 mg by mouth daily.   omeprazole (PRILOSEC) 20 MG capsule Take 20 mg by mouth daily.   tamoxifen (NOLVADEX) 20 MG tablet TAKE 1 TABLET BY MOUTH EVERY DAY   tretinoin (RETIN-A) 0.025 % cream Apply 1 application. topically at bedtime.   VERZENIO 150 MG tablet TAKE 1 TABLET BY MOUTH 2 TIMES A DAY   [DISCONTINUED] Investigational olanzapine/placebo 10 MG capsule [facility]  16070 Blister Card 2 Take 1 capsule by mouth daily. Take in the morning on Days 2-4.   No facility-administered encounter medications on file as of 09/12/2022.     There were no vitals filed for this visit. There is no height or weight on file to calculate BMI.   PHYSICAL EXAM GENERAL:alert, no distress and comfortable SKIN: no rash  EYES: sclera clear NECK: without mass LYMPH:  no palpable cervical or supraclavicular lymphadenopathy  LUNGS: clear with normal breathing effort HEART: regular rate & rhythm, no lower extremity edema ABDOMEN: abdomen soft, non-tender and normal bowel sounds NEURO: alert & oriented x 3 with fluent speech, no focal motor/sensory deficits Breast exam:  PAC without erythema    CBC    Component Value Date/Time   WBC 2.8 (L) 08/17/2022 0852   WBC 2.6 (L) 02/16/2022 0949   RBC 4.29 08/17/2022 0852   HGB 13.7 08/17/2022 0852   HCT 40.5 08/17/2022 0852   PLT 176 08/17/2022 0852   MCV 94.4 08/17/2022 0852   MCH 31.9 08/17/2022 0852   MCHC 33.8 08/17/2022 0852   RDW 12.9 08/17/2022 0852   LYMPHSABS 1.2 08/17/2022 0852   MONOABS 0.3 08/17/2022 0852   EOSABS 0.0 08/17/2022 0852   BASOSABS 0.0 08/17/2022 0852     CMP     Component Value Date/Time   NA 141 08/17/2022 0852   K 4.6 08/17/2022 0852   CL 105 08/17/2022 0852   CO2 29 08/17/2022 0852   GLUCOSE 80 08/17/2022 0852   BUN 17 08/17/2022 0852   CREATININE 0.90 08/17/2022 0852   CALCIUM 10.2 08/17/2022 0852   PROT 7.5 08/17/2022 0852   ALBUMIN 4.3 08/17/2022 0852   AST 16 08/17/2022 0852   ALT 12 08/17/2022 0852   ALKPHOS 43 08/17/2022 0852   BILITOT 0.5 08/17/2022 0852   GFRNONAA >60 08/17/2022 0852     ASSESSMENT & PLAN:41 y.o. female       1. 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 Biopsy on 11/05/20  confirmed invasive ductal carcinoma, with metastatic involvement of one lymph node. -staging CT CAP and bone scan on 11/19/20 showed no distant metastasis -S/p left mastectomy on 01/06/21 under Dr. Donell Beers. Pathology showed 6.5 cm invasive ductal carcinoma, grade 2, involving dermis of nipple without epidermal involvement. Margins were negative, but all 4 lymph nodes showed metastatic disease. -s/p 4 cycles AC 02/11/21 - 03/25/21 and Abraxane 04/08/21 - 06/24/21. -PET scan on 06/16/21 showed no convincing evidence of local recurrence or metastatic disease. -left ALND on 07/07/21 with Dr. Donell Beers, path showed 1/11 positive nodes -s/p postmastectomy radiation by Dr. Mitzi Hansen 08/16/21 - 10/04/21. -she started Zoladex in 07/07/21 and letrozole on 07/21/21. She developed worsening joint pain and was switched  to exemestane in early 10/2021. due to worsening joint pain, urinary sx, and negative QOL we switched to Tamoxifen (and stopped zoladex) in 04/2022  -she started Verzenio 100mg  BID on 10/28/21, increased to 150 mg BID after the first month. Tolerating with mild diarrhea  -S/p breast reconstruction 02/18/22, recovered -Given worsening left chest wall pain, will hold Tamoxifen for couple weeks to see if related -Screening breast MRI 08/30/22 was negative   2.  Left chest wall/rib pain and other left scapular pain  -left chest wall pain began ~11/2021, unprovoked - There was some tenderness to palpation but no obvious deformity or abnormality.  Nothing on PET scan from 06/2021 -left rib/chest Xray 12/2021 was unremarkable -CTA 02/17/2022 ordered by Dr. Donell Beers showed healing subacute nondisplaced right anterior fourth and fifth rib fractures, no acute PE -Ms. Novicki appears stable. She has worsening left chest wall/rib pain that appears more visceral. Exam is benign, pain is not reproducible.  -Given her breast cancer history, progressive pain, and unintentional weight loss will obtain CT CAP/bone scan to r/o recurrent  metastatic disease. Phone visit few days later. -She does not need pain medicine -She has separate left scapular pain, treated with gabapentin and has nearly resolved    3. Sternal pain, chest pressure, anxiety -Onset few months -She has separate chest pressure which worsens with anxiety.  -EKG normal in clinic 02/03/22.  -resolved    PLAN:  No orders of the defined types were placed in this encounter.     All questions were answered. The patient knows to call the clinic with any problems, questions or concerns. No barriers to learning were detected. I spent *** counseling the patient face to face. The total time spent in the appointment was *** and more than 50% was on counseling, review of test results, and coordination of care.   Santiago Glad, NP-C @DATE @

## 2022-09-12 ENCOUNTER — Encounter: Payer: Self-pay | Admitting: Nurse Practitioner

## 2022-09-12 ENCOUNTER — Inpatient Hospital Stay: Payer: BC Managed Care – PPO | Attending: Hematology | Admitting: Nurse Practitioner

## 2022-09-12 VITALS — BP 109/64 | HR 94 | Temp 98.0°F | Resp 17 | Wt 121.4 lb

## 2022-09-12 DIAGNOSIS — Z9012 Acquired absence of left breast and nipple: Secondary | ICD-10-CM | POA: Diagnosis not present

## 2022-09-12 DIAGNOSIS — C773 Secondary and unspecified malignant neoplasm of axilla and upper limb lymph nodes: Secondary | ICD-10-CM | POA: Insufficient documentation

## 2022-09-12 DIAGNOSIS — Z923 Personal history of irradiation: Secondary | ICD-10-CM | POA: Insufficient documentation

## 2022-09-12 DIAGNOSIS — C50412 Malignant neoplasm of upper-outer quadrant of left female breast: Secondary | ICD-10-CM | POA: Diagnosis present

## 2022-09-12 DIAGNOSIS — Z170 Estrogen receptor positive status [ER+]: Secondary | ICD-10-CM | POA: Insufficient documentation

## 2022-09-12 DIAGNOSIS — Z79810 Long term (current) use of selective estrogen receptor modulators (SERMs): Secondary | ICD-10-CM | POA: Diagnosis not present

## 2022-09-12 DIAGNOSIS — R0781 Pleurodynia: Secondary | ICD-10-CM | POA: Insufficient documentation

## 2022-09-12 DIAGNOSIS — R0789 Other chest pain: Secondary | ICD-10-CM | POA: Diagnosis not present

## 2022-09-12 MED ORDER — OMEPRAZOLE 20 MG PO CPDR: 20.0000 mg | DELAYED_RELEASE_CAPSULE | Freq: Every day | ORAL | 1 refills | Status: DC

## 2022-09-12 MED ORDER — GABAPENTIN 100 MG PO CAPS: 100.0000 mg | ORAL_CAPSULE | Freq: Three times a day (TID) | ORAL | 2 refills | Status: DC

## 2022-09-13 ENCOUNTER — Other Ambulatory Visit: Payer: Self-pay

## 2022-09-15 ENCOUNTER — Encounter: Payer: Self-pay | Admitting: Nurse Practitioner

## 2022-09-23 ENCOUNTER — Ambulatory Visit (INDEPENDENT_AMBULATORY_CARE_PROVIDER_SITE_OTHER): Payer: BC Managed Care – PPO | Admitting: Obstetrics and Gynecology

## 2022-09-23 ENCOUNTER — Encounter: Payer: Self-pay | Admitting: Obstetrics and Gynecology

## 2022-09-23 VITALS — BP 115/71 | HR 109

## 2022-09-23 DIAGNOSIS — N941 Unspecified dyspareunia: Secondary | ICD-10-CM | POA: Diagnosis not present

## 2022-09-23 DIAGNOSIS — N952 Postmenopausal atrophic vaginitis: Secondary | ICD-10-CM | POA: Diagnosis not present

## 2022-09-23 DIAGNOSIS — R3989 Other symptoms and signs involving the genitourinary system: Secondary | ICD-10-CM

## 2022-09-23 NOTE — Progress Notes (Signed)
Pamela Reyes Return Visit  SUBJECTIVE  History of Present Illness: Pamela Reyes is a 42 y.o. female seen in follow-up for pelvic floor pain and IC. Plan at last visit was use estrogen cream, PRN vaginal valium, and use of dilators as needed.   She is taking a combination support medication for IC and nightly Claratin which she reports is working well.   She is using the estrogen cream x2 weekly which she reports has been helpful. She reports she has had some vaginal itching that is bothersome intermittently.    Past Medical History: Patient  has a past medical history of Anxiety, Axillary pain, right (01/18/2021), Breast cancer (HCC), Breast pain, left (10/06/2020), Encounter for gynecological examination with Papanicolaou smear of cervix (02/10/2021), Encounter for screening fecal occult blood testing (02/10/2021), Family history of breast cancer (11/11/2020), Fibroid (10/27/2020), H/O left mastectomy, History of hiatal hernia, Left breast mass (10/06/2020), LLQ pain (10/06/2020), PONV (postoperative nausea and vomiting), and Port-A-Cath in place (03/25/2021).   Past Surgical History: She  has a past surgical history that includes Wisdom tooth extraction; Mastectomy w/ sentinel node biopsy (Left, 01/06/2021); Radioactive seed guided axillary sentinel lymph node (Left, 01/06/2021); Sentinel node biopsy (Left, 01/06/2021); Breast reconstruction with placement of tissue expander and flex hd (acellular hydrated dermis) (Left, 01/06/2021); Portacath placement (Right, 01/19/2021); Axillary lymph node dissection (Left, 07/07/2021); Port-a-cath removal (Right, 07/07/2021); and Removal of tissue expander and placement of implant (Left, 02/18/2022).   Medications: She has a current medication list which includes the following prescription(s): acetaminophen, biotin, dialyvite vitamin d 5000, diazepam, estradiol, gabapentin, hydroxyzine, loratadine, omeprazole, tamoxifen, tretinoin, verzenio,  and [DISCONTINUED] investigational olanzapine/placebo.   Allergies: Patient is allergic to prochlorperazine.   Social History: Patient  reports that she quit smoking about 20 years ago. Her smoking use included cigarettes. She started smoking about 24 years ago. She has a 1 pack-year smoking history. She has never used smokeless tobacco. She reports current alcohol use of about 8.0 standard drinks of alcohol per week. She reports that she does not use drugs.      OBJECTIVE     Physical Exam: Vitals:   09/23/22 0829  BP: 115/71  Pulse: (!) 109   Gen: No apparent distress, A&O x 3.  Detailed Urogynecologic Evaluation:  Deferred.    ASSESSMENT AND PLAN    [name] is a 42 y.o. with:  1. Vaginal atrophy   2. Bladder pain   3. Dyspareunia, female    Patient to continue using vaginal estrogen cream x2 weekly. She can also use coconut oil or vitamin e cream in between doses of estrogen cream.  Patient reports she is also having some bladder pain and pressure after sex and she is not sure if it is more mental or physical symptoms. We discussed she could use a compounded cream after sex and she is not interested at this time. We also discussed she could re-start pelvic floor PT and she reports if she feels like she needs it, she can let us know.  Patient is on a combination supplement that she reports is doing well. We discussed if she is having flares to let us know and we can do a urine sample or do a bladder installation if urine negative.   Patient to follow up in 1 year or sooner if needed.

## 2022-09-23 NOTE — Patient Instructions (Signed)
For the itching I suggest having boric acid suppositories at home as this will help re-align the vaginal pH and encourage normal flora.   On nights you are not doing the estrogen you can also do things like coconut oil and vitamin E cream to support the vaginal tissues.   If you begin having IC flares, we can do bladder installations and check your urine for active UTI infection.   Let me know if you need another referral to pelvic floor PT or if you want to try the compounded vaginal cream.

## 2022-10-07 ENCOUNTER — Other Ambulatory Visit: Payer: Self-pay | Admitting: Hematology

## 2022-10-31 ENCOUNTER — Ambulatory Visit: Payer: BC Managed Care – PPO | Attending: Hematology

## 2022-10-31 VITALS — Wt 121.0 lb

## 2022-10-31 DIAGNOSIS — Z483 Aftercare following surgery for neoplasm: Secondary | ICD-10-CM | POA: Insufficient documentation

## 2022-10-31 NOTE — Therapy (Signed)
OUTPATIENT PHYSICAL THERAPY SOZO SCREENING NOTE   Patient Name: Pamela Reyes MRN: [id number] DOB:Jan 03, 1981, 42 y.o., female Today's Date: 10/31/2022  PCP: Dulce Sellar, NP REFERRING PROVIDER: Malachy Mood, MD   PT End of Session - 10/31/22 [phone]     Visit Number 6   # unchanged due to screen only   PT Start Time 0840    PT Stop Time 0844    PT Time Calculation (min) 4 min    Activity Tolerance Patient tolerated treatment well    Behavior During Therapy [facility] for tasks assessed/performed             Past Medical History:  Diagnosis Date   Anxiety    Axillary pain, right 01/18/2021   Will get right axillary ultrasound.   Breast cancer (HCC)    Breast pain, left 10/06/2020   Encounter for gynecological examination with Papanicolaou smear of cervix 02/10/2021   Encounter for screening fecal occult blood testing 02/10/2021   Family history of breast cancer 11/11/2020   Fibroid 10/27/2020   Has 1 x .9 x .6 cm subserosal fibroid   H/O left mastectomy    left mastectomy 2022   History of hiatal hernia    Left breast mass 10/06/2020   +breast cancer   LLQ pain 10/06/2020   PONV (postoperative nausea and vomiting)    Port-A-Cath in place 03/25/2021   Past Surgical History:  Procedure Laterality Date   AXILLARY LYMPH NODE DISSECTION Left 07/07/2021   Procedure: LEFT AXILLARY LYMPH NODE DISSECTION;  Surgeon: Almond Lint, MD;  Location: [facility];  Service: General;  Laterality: Left;   BREAST RECONSTRUCTION WITH PLACEMENT OF TISSUE EXPANDER AND FLEX HD (ACELLULAR HYDRATED DERMIS) Left 01/06/2021   Procedure: LEFT BREAST RECONSTRUCTION WITH PLACEMENT OF TISSUE EXPANDER AND ACELLULAR  DERMIS;  Surgeon: Glenna Fellows, MD;  [facility];  Service: Plastics;  Laterality: Left;   MASTECTOMY W/ SENTINEL NODE BIOPSY Left 01/06/2021   Procedure: LEFT MASTECTOMY WITH MAGTRACE;  Surgeon: Almond Lint, MD;  [facility]: [facility];  Service: General;  Laterality:  Left;   PORT-A-CATH REMOVAL Right 07/07/2021   Procedure: REMOVAL PORT-A-CATH;  Surgeon: Almond Lint, MD;  Location: [facility];  Service: General;  Laterality: Right;   PORTACATH PLACEMENT Right 01/19/2021   Procedure: INSERTION PORT-A-CATH;  Surgeon: Almond Lint, MD;  [facility];  Service: General;  Laterality: Right;   RADIOACTIVE SEED GUIDED AXILLARY SENTINEL LYMPH NODE Left 01/06/2021   Procedure: RADIOACTIVE SEED GUIDED SENTINEL LYMPH NODE BIOPSY;  Surgeon: Almond Lint, MD;  [facility];  Service: General;  Laterality: Left;   REMOVAL OF TISSUE EXPANDER AND PLACEMENT OF IMPLANT Left 02/18/2022   Procedure: REMOVAL OF TISSUE EXPANDER AND PLACEMENT OF IMPLANT;  Surgeon: Glenna Fellows, MD;  Location: [facility];  Service: Plastics;  Laterality: Left;   SENTINEL NODE BIOPSY Left 01/06/2021   Procedure: LEFT AXILLARY SENTINEL LYMPH  NODE BIOPSY;  Surgeon: Almond Lint, MD;  [facility];  Service: General;  Laterality: Left;   WISDOM TOOTH EXTRACTION     Patient Active Problem List   Diagnosis Date Noted   History of breast cancer 06/02/2022   Vaginal atrophy 06/02/2022   Burning with urination 10/25/2021   Urinary frequency 10/25/2021   Urgency of urination 10/25/2021   Left flank pain, chronic 09/27/2021   Paresthesia 06/08/2021   Port-A-Cath in place 03/25/2021   Encounter for screening fecal occult blood testing 02/10/2021   Encounter for gynecological examination with  Papanicolaou smear of cervix 02/10/2021   Genetic testing 11/25/2020   Family history of breast cancer 11/11/2020   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 lymph node, left (HCC) 11/11/2020   Dense breasts 11/11/2020   Malignant neoplasm of upper-outer quadrant of left breast in female, estrogen receptor positive (HCC) 11/10/2020   Fibroid 10/27/2020    REFERRING DIAG: left breast cancer at risk for lymphedema  THERAPY DIAG: Aftercare following surgery for  neoplasm  PERTINENT HISTORY: Diagnosed 10/29/2020 and found by self exam. Biopsy revealed IDC ER, PR +, HER 2 -.  Pt underwent left mastectomy on 01/06/2021 with expander. 4+/5 LN. Chemotherapy completed ACP, Lymph node dissection on 07/07/21 with 1/11 lymph nodes positive. Completed radiation at end of August 2023   PRECAUTIONS: left UE Lymphedema risk, None  SUBJECTIVE: Pt returns for her last 3 month L-dex screen.   PAIN:  Are you having pain? No  SOZO SCREENING: Patient was assessed today using the SOZO machine to determine the lymphedema index score. This was compared to her baseline score. It was determined that she is within the recommended range when compared to her baseline and no further action is needed at this time. She will continue SOZO screenings. These are done every 3 months for 2 years post operatively followed by every 6 months for 2 years, and then annually.   L-DEX FLOWSHEETS - 10/31/22 0800       L-DEX LYMPHEDEMA SCREENING   Measurement Type Unilateral    L-DEX MEASUREMENT EXTREMITY Upper Extremity    POSITION  Standing    DOMINANT SIDE Right    At Risk Side Left    BASELINE SCORE (UNILATERAL) -0.2    L-DEX SCORE (UNILATERAL) -1.2    VALUE CHANGE (UNILAT) -1            P: Begin 6 month L-dex screens x 2 years.    Hermenia Bers, PTA 10/31/2022, 8:44 AM

## 2022-11-03 ENCOUNTER — Other Ambulatory Visit: Payer: Self-pay | Admitting: Hematology

## 2022-11-16 ENCOUNTER — Inpatient Hospital Stay (HOSPITAL_BASED_OUTPATIENT_CLINIC_OR_DEPARTMENT_OTHER): Payer: BC Managed Care – PPO | Admitting: Hematology

## 2022-11-16 ENCOUNTER — Other Ambulatory Visit: Payer: Self-pay

## 2022-11-16 ENCOUNTER — Inpatient Hospital Stay: Payer: BC Managed Care – PPO | Attending: Hematology

## 2022-11-16 VITALS — BP 117/75 | HR 81 | Temp 98.2°F | Resp 16 | Ht 64.0 in | Wt 123.3 lb

## 2022-11-16 DIAGNOSIS — N951 Menopausal and female climacteric states: Secondary | ICD-10-CM | POA: Insufficient documentation

## 2022-11-16 DIAGNOSIS — Z1721 Progesterone receptor positive status: Secondary | ICD-10-CM | POA: Diagnosis not present

## 2022-11-16 DIAGNOSIS — Z79810 Long term (current) use of selective estrogen receptor modulators (SERMs): Secondary | ICD-10-CM | POA: Insufficient documentation

## 2022-11-16 DIAGNOSIS — Z923 Personal history of irradiation: Secondary | ICD-10-CM | POA: Insufficient documentation

## 2022-11-16 DIAGNOSIS — R0789 Other chest pain: Secondary | ICD-10-CM | POA: Diagnosis not present

## 2022-11-16 DIAGNOSIS — Z170 Estrogen receptor positive status [ER+]: Secondary | ICD-10-CM | POA: Insufficient documentation

## 2022-11-16 DIAGNOSIS — Z803 Family history of malignant neoplasm of breast: Secondary | ICD-10-CM | POA: Diagnosis not present

## 2022-11-16 DIAGNOSIS — Z9012 Acquired absence of left breast and nipple: Secondary | ICD-10-CM | POA: Insufficient documentation

## 2022-11-16 DIAGNOSIS — C50412 Malignant neoplasm of upper-outer quadrant of left female breast: Secondary | ICD-10-CM | POA: Insufficient documentation

## 2022-11-16 LAB — CBC WITH DIFFERENTIAL (CANCER CENTER ONLY)
Abs Immature Granulocytes: 0.01 10*3/uL (ref 0.00–0.07)
Basophils Absolute: 0 10*3/uL (ref 0.0–0.1)
Basophils Relative: 1 %
Eosinophils Absolute: 0 10*3/uL (ref 0.0–0.5)
Eosinophils Relative: 2 %
HCT: 40.3 % (ref 36.0–46.0)
Hemoglobin: 13.6 g/dL (ref 12.0–15.0)
Immature Granulocytes: 0 %
Lymphocytes Relative: 37 %
Lymphs Abs: 1 10*3/uL (ref 0.7–4.0)
MCH: 32.5 pg (ref 26.0–34.0)
MCHC: 33.7 g/dL (ref 30.0–36.0)
MCV: 96.2 fL (ref 80.0–100.0)
Monocytes Absolute: 0.2 10*3/uL (ref 0.1–1.0)
Monocytes Relative: 6 %
Neutro Abs: 1.5 10*3/uL — ABNORMAL LOW (ref 1.7–7.7)
Neutrophils Relative %: 54 %
Platelet Count: 186 10*3/uL (ref 150–400)
RBC: 4.19 MIL/uL (ref 3.87–5.11)
RDW: 13.2 % (ref 11.5–15.5)
WBC Count: 2.7 10*3/uL — ABNORMAL LOW (ref 4.0–10.5)
nRBC: 0 % (ref 0.0–0.2)

## 2022-11-16 LAB — CMP (CANCER CENTER ONLY)
ALT: 12 U/L (ref 0–44)
AST: 17 U/L (ref 15–41)
Albumin: 4.4 g/dL (ref 3.5–5.0)
Alkaline Phosphatase: 37 U/L — ABNORMAL LOW (ref 38–126)
Anion gap: 8 (ref 5–15)
BUN: 16 mg/dL (ref 6–20)
CO2: 28 mmol/L (ref 22–32)
Calcium: 10 mg/dL (ref 8.9–10.3)
Chloride: 105 mmol/L (ref 98–111)
Creatinine: 0.91 mg/dL (ref 0.44–1.00)
GFR, Estimated: >60 mL/min (ref >60)
Glucose, Bld: 93 mg/dL (ref 70–99)
Potassium: 4.6 mmol/L (ref 3.5–5.1)
Sodium: 141 mmol/L (ref 135–145)
Total Bilirubin: 0.5 mg/dL (ref 0.3–1.2)
Total Protein: 7.2 g/dL (ref 6.5–8.1)

## 2022-11-16 MED ORDER — ABEMACICLIB 150 MG PO TABS: 150.0000 mg | ORAL_TABLET | Freq: Two times a day (BID) | ORAL | 3 refills | Status: DC

## 2022-11-16 NOTE — Addendum Note (Signed)
Addended by: Arcelia Jew on: 11/16/2022 10:31 AM   Modules accepted: Orders

## 2022-11-16 NOTE — Progress Notes (Signed)
[facility]   Telephone:([phone]) [phone] Fax:([phone]) [phone]   Clinic Follow up Note   Patient Care Team: Dulce Sellar, NP as PCP - General (Family Medicine) Pershing Proud, RN as Oncology Nurse Navigator Donnelly Angelica, RN as Oncology Nurse Navigator Almond Lint, MD as Consulting Physician (General Surgery) Malachy Mood, MD as Consulting Physician (Hematology) Dorothy Puffer, MD as Consulting Physician (Radiation Oncology) Adline Potter, NP as Nurse Practitioner (Obstetrics and Gynecology) Pollyann Samples, NP as Nurse Practitioner (Nurse Practitioner)  Date of Service:  11/16/2022  CHIEF COMPLAINT: f/u of breast cancer  CURRENT THERAPY:  Tamoxifen and Verzenio  Oncology History   Malignant neoplasm of upper-outer quadrant of left breast in female, 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 Biopsy on 11/05/20 confirmed invasive ductal carcinoma, with metastatic involvement of one lymph node. -staging CT CAP and bone scan on 11/19/20 showed no distant metastasis -she underwent left mastectomy on 01/06/21 under Dr. Donell Beers. Pathology showed 6.5 cm invasive ductal carcinoma, grade 2, involving dermis of nipple without epidermal involvement. Margins were negative, but all 4 lymph nodes showed metastatic disease. -s/p 4 cycles AC 02/11/21 - 03/25/21 and Abraxane 04/08/21 - 06/24/21. -PET scan on 06/16/21 showed no convincing evidence of local recurrence or metastatic disease. -left ALND on 07/07/21 with Dr. Donell Beers, path showed 1/11 positive nodes -s/p postmastectomy radiation by Dr. Mitzi Hansen 08/16/21 - 10/04/21. -she started Zoladex in 07/07/21 and letrozole on 07/21/21. She developed worsening joint pain and was switched to exemestane in early 10/2021. But due to worsening joint pain, urinary sx, and negative QOL we switched to  Tamoxifen (and stopped zoladex) in 04/2022  -she started Verzenio 100mg  BID on 10/28/21, dose increased to 150mg  bid later. She had UTI, fatigue etc, but overall tolerating better now -she had reconsult surgery on February 18, 2022, Verzenio was held before surgery. -She has developed left-sided rib cage pain since early 2024, CT and bone scan in July were negative for recurrence.   Assessment and Plan    Breast Cancer Stable disease with no evidence of recurrence on recent imaging and tumor markers. Patient is on Tamoxifen and Verzenio with mild neutropenia likely secondary to [employer]. -Continue Tamoxifen and Verzenio. -Check blood counts in 3 months. -Order ctDNA test for early detection of recurrence.  Chest Pain Patient reports intermittent chest pain on the same side as her breast surgery. Pain is worse with activity and may be related to scar tissue or nerve irritation. -Refer to physical therapy for evaluation and possible dry needling. -Consider trial of Cymbalta or Gabapentin if pain worsens.  She declined for now.  Menopausal Symptoms Patient reports night sweats and thinning hair, likely secondary to Tamoxifen and Verzenio. She is using vaginal estrogen and moisturizer for vaginal dryness. -Continue current management. -Encourage hydration for management of diarrhea secondary to Verzenio.  plan -Continue Tamoxifen and Verzenio. -Lab and follow-up in 3 months -Order ctDNA test [employer] for early detection of recurrence.  First blood draw next week, then every 3 months -Return to clinic in 3 months for follow-up.       SUMMARY OF ONCOLOGIC HISTORY: Oncology History Overview Note   Cancer Staging  Malignant neoplasm of upper-outer quadrant of left breast in female, estrogen receptor positive (HCC) Staging form: Breast, AJCC 8th Edition - Clinical stage from 11/05/2020: Stage IIA (cT2, cN1, cM0, G1, ER+, PR+, HER2: Equivocal) -  Signed by Malachy Mood, MD on 11/10/2020 -  Pathologic stage from 01/06/2021: Stage IB (pT3, pN2a, cM0, G2, ER+, PR+, HER2-) - Signed by Malachy Mood, MD on 01/12/2021     Malignant neoplasm of upper-outer quadrant of left breast in female, estrogen receptor positive (HCC)  10/28/2020 Mammogram   Bilateral Diagnostic Mammogram; Left Breast Ultrasound  IMPRESSION: 1. Suspicious palpable mass/hypoechoic area in the left breast at 12 o'clock retroareolar measuring 4.5 cm.   2.  Suspicious lymph node in the left axilla.   11/05/2020 Cancer Staging   Staging form: Breast, AJCC 8th Edition - Clinical stage from 11/05/2020: Stage IIA (cT2, cN1, cM0, G1, ER+, PR+, HER2: Equivocal) - Signed by Malachy Mood, MD on 11/10/2020 Stage prefix: Initial diagnosis Histologic grading system: 3 grade system   11/05/2020 Pathology Results   Diagnosis 1. Breast, left, needle core biopsy, 12 o'clock subareolar, ribbon clip - INVASIVE MAMMARY CARCINOMA. SEE NOTE 1. Carcinoma measures 1.5 cm in greatest linear dimension and appears grade 2. 1. Immunohistochemical stain for E-cadherin is positive in the tumor cells, consistent with a ductal phenotype. 1. PROGNOSTIC INDICATORS Results: The tumor cells are EQUIVOCAL for Her2 (2+). Her2 by FISH will be performed and results reported separately. Estrogen Receptor: 70%, POSITIVE, STRONG-MODERATE STAINING INTENSITY Progesterone Receptor: 80%, POSITIVE, MODERATE STAINING INTENSITY Proliferation Marker Ki67: 1%  2. Lymph node, needle/core biopsy, left axilla, tribell clip - METASTATIC CARCINOMA TO A LYMPH NODE. SEE NOTE 2. Largest contiguous focus of metastatic carcinoma measures 0.3 cm. 2. PROGNOSTIC INDICATORS Results: The tumor cells are EQUIVOCAL for Her2 (2+). Her2 by FISH will be performed and results reported separately. Estrogen Receptor: 90%, POSITIVE, STRONG STAINING INTENSITY Progesterone Receptor: 95%, POSITIVE, STRONG STAINING INTENSITY Proliferation Marker Ki67: 1%   11/10/2020 Initial Diagnosis    Malignant neoplasm of upper-outer quadrant of left breast in female, estrogen receptor positive (HCC)   11/19/2020 Imaging   EXAM: CT CHEST, ABDOMEN, AND PELVIS WITH CONTRAST  IMPRESSION: 1. Left breast mass  as previously described. 2. Enhancing subcentimeter left axillary lymph nodes. 3. No evidence of distal metastatic disease. 4. Small hiatal hernia   11/19/2020 Imaging   EXAM: NUCLEAR MEDICINE WHOLE BODY BONE SCAN  IMPRESSION: No evidence of metastatic disease.   11/21/2020 Imaging   EXAM: BILATERAL BREAST MRI WITH AND WITHOUT CONTRAST  IMPRESSION: 1. Suspicious 1.3 centimeter mass in the anterior UPPER central RIGHT breast warranting tissue diagnosis. (Image 65 of series 7). 2. 4 millimeter possible satellite nodule posterior to the 1.3 centimeter mass in the RIGHT breast. (Image 63 of series 7). 3. Indeterminate oval mass in the UPPER central middle depth of the RIGHT breast warranting tissue diagnosis. (Image 60 of series 7). 4. Large area of enhancement in the central portion of the LEFT breast, involving all quadrants. There is associated enhancement and retraction of the LEFT nipple, with significantly smaller size of the LEFT breast. Enhancement spans at least 6.5 centimeters. 5. Enlarged, previously biopsied LEFT axillary lymph node.   11/24/2020 Genetic Testing   Negative hereditary cancer genetic testing: no pathogenic variants detected in [employer] BRCAPlus STAT Panel or [employer] CustomNext-Cancer +RNAinsight Panel.  The report dates are November 24, 2020 and November 27, 2020, respectively.   The BRCAplus panel offered by [employer] and includes sequencing and deletion/duplication analysis for the following 8 genes: ATM, BRCA1, BRCA2, CDH1, CHEK2, PALB2, PTEN, and TP53.  The CustomNext-Cancer+RNAinsight panel offered by [employer] includes sequencing and rearrangement analysis for the following 47 genes:  APC, ATM, AXIN2, BARD1, BMPR1A,  BRCA1, BRCA2, BRIP1, CDH1,  CDK4, CDKN2A, CHEK2, DICER1, EPCAM, GREM1, HOXB13, MEN1, MLH1, MSH2, MSH3, MSH6, MUTYH, NBN, NF1, NF2, NTHL1, PALB2, PMS2, POLD1, POLE, PTEN, RAD51C, RAD51D, RECQL, RET, SDHA, SDHAF2, SDHB, SDHC, SDHD, SMAD4, SMARCA4, STK11, TP53, TSC1, TSC2, and VHL.  RNA data is routinely analyzed for use in variant interpretation for all genes.    11/27/2020 Pathology Results   Diagnosis Breast, right, needle core biopsy, anterior upper central - FIBROADENOMATOID AND FIBROCYSTIC CHANGES WITH CALCIFICATIONS - PSEUDOANGIOMATOUS STROMAL HYPERPLASIA - FOCAL PERIDUCTULAR CHRONIC INFLAMMATION - NO MALIGNANCY IDENTIFIED   01/06/2021 Cancer Staging   Staging form: Breast, AJCC 8th Edition - Pathologic stage from 01/06/2021: Stage IB (pT3, pN2a, cM0, G2, ER+, PR+, HER2-) - Signed by Malachy Mood, MD on 01/12/2021 Stage prefix: Initial diagnosis Histologic grading system: 3 grade system Residual tumor (R): R0 - None   01/06/2021 Definitive Surgery   FINAL MICROSCOPIC DIAGNOSIS:   A. LYMPH NODE, LEFT AXILLARY, SENTINEL, EXCISION:  - Metastatic carcinoma to a lymph node (1/1)  - Focus of metastatic carcinoma measures 1.5 cm without evidence of extranodal extension   B. 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 BREAST, LEFT INFERIOLATERAL MARGIN, EXCISION:  - Benign fibroadipose tissue  - Negative for carcinoma   D. LYMPH NODE, LEFT AXILLARY #1, SENTINEL, EXCISION:  - Metastatic carcinoma to a lymph node (1/1)  - Focus of metastatic carcinoma measures 1.4 cm without evidence of extranodal extension   E. LYMPH NODE, LEFT AXILLARY #2, SENTINEL, EXCISION:  - Metastatic carcinoma to a lymph node (1/1)  - Focus of metastatic carcinoma measures 0.5 cm without evidence of extranodal extension   F. LYMPH NODE, LEFT  AXILLARY #3, SENTINEL, EXCISION:  - Metastatic carcinoma to a lymph node (1/1)  - Focus of metastatic carcinoma measures 0.9 cm without evidence of extranodal extension   G. LYMPH NODE, LEFT AXILLARY #4, SENTINEL, EXCISION:  - Benign fibroadipose tissue, negative for carcinoma  - Lymphoid tissue is not identified    02/11/2021 - 06/24/2021 Chemotherapy   Patient is on Treatment Plan : BREAST ADJUVANT DOSE DENSE AC q14d / PACLitaxel q7d     06/16/2021 PET scan   IMPRESSION: 1. Postsurgical change of left mastectomy and left axillary lymph node dissection with tissue expander in place. Mild hypermetabolic activity about the surgical site without suspicious focal hypermetabolic soft tissue nodularity, favored to reflect postsurgical change.   2. No convincing scintigraphic evidence of hypermetabolic local recurrence or metastatic disease within the neck, chest, abdomen or pelvis.      Discussed the use of AI scribe software for clinical note transcription with the patient, who gave verbal consent to proceed.  History of Present Illness   A 42 year old female with a history of breast cancer presents with intermittent rib pain on the same side where she had breast surgery. The pain is more noticeable during activity and has been more frequent in the last few weeks. The patient reports that the pain sometimes feels like she is in a vice and can last for several hours. The pain had subsided for a while but has returned in the last few weeks. The patient had mild pain in the same area before surgery, but it has significantly worsened post-surgery. The patient also reports experiencing thinning hair and diarrhea, which  are side effects of her medication, Verzenio.         All other systems were reviewed with the patient and are negative.  MEDICAL HISTORY:  Past Medical History:  Diagnosis Date   Anxiety    Axillary pain, right 01/18/2021   Will get right axillary ultrasound.   Breast  cancer (HCC)    Breast pain, left 10/06/2020   Encounter for gynecological examination with Papanicolaou smear of cervix 02/10/2021   Encounter for screening fecal occult blood testing 02/10/2021   Family history of breast cancer 11/11/2020   Fibroid 10/27/2020   Has 1 x .9 x .6 cm subserosal fibroid   H/O left mastectomy    left mastectomy 2022   History of hiatal hernia    Left breast mass 10/06/2020   +breast cancer   LLQ pain 10/06/2020   PONV (postoperative nausea and vomiting)    Port-A-Cath in place 03/25/2021    SURGICAL HISTORY: Past Surgical History:  Procedure Laterality Date   AXILLARY LYMPH NODE DISSECTION Left 07/07/2021   Procedure: LEFT AXILLARY LYMPH NODE DISSECTION;  Surgeon: Almond Lint, MD;  Location: [facility];  Service: General;  Laterality: Left;   BREAST RECONSTRUCTION WITH PLACEMENT OF TISSUE EXPANDER AND FLEX HD (ACELLULAR HYDRATED DERMIS) Left 01/06/2021   Procedure: LEFT BREAST RECONSTRUCTION WITH PLACEMENT OF TISSUE EXPANDER AND ACELLULAR  DERMIS;  Surgeon: Glenna Fellows, MD;  Location: [facility];  Service: Plastics;  Laterality: Left;   MASTECTOMY W/ SENTINEL NODE BIOPSY Left 01/06/2021   Procedure: LEFT MASTECTOMY WITH MAGTRACE;  Surgeon: Almond Lint, MD;  Location: [facility];  Service: General;  Laterality: Left;   PORT-A-CATH REMOVAL Right 07/07/2021   Procedure: REMOVAL PORT-A-CATH;  Surgeon: Almond Lint, MD;  Location: [facility];  Service: General;  Laterality: Right;   PORTACATH PLACEMENT Right 01/19/2021   Procedure: INSERTION PORT-A-CATH;  Surgeon: Almond Lint, MD;  [facility]: [facility];  Service: General;  Laterality: Right;   RADIOACTIVE SEED GUIDED AXILLARY SENTINEL LYMPH NODE Left 01/06/2021   Procedure: RADIOACTIVE SEED GUIDED SENTINEL LYMPH NODE BIOPSY;  Surgeon: Almond Lint, MD;  Location: [facility];  Service: General;  Laterality: Left;   REMOVAL OF TISSUE EXPANDER AND PLACEMENT OF IMPLANT Left 02/18/2022    Procedure: REMOVAL OF TISSUE EXPANDER AND PLACEMENT OF IMPLANT;  Surgeon: Glenna Fellows, MD;  Location: [facility];  Service: Plastics;  Laterality: Left;   SENTINEL NODE BIOPSY Left 01/06/2021   Procedure: LEFT AXILLARY SENTINEL LYMPH  NODE BIOPSY;  Surgeon: Almond Lint, MD;  [facility]: [facility];  Service: General;  Laterality: Left;   WISDOM TOOTH EXTRACTION      I have reviewed the social history and family history with the patient and they are unchanged from previous note.  ALLERGIES:  is allergic to prochlorperazine.  MEDICATIONS:  Current Outpatient Medications  Medication Sig Dispense Refill   abemaciclib (VERZENIO) 150 MG tablet Take 1 tablet (150 mg total) by mouth 2 (two) times daily. 56 tablet 3   acetaminophen (TYLENOL) 500 MG tablet Take 500-1,000 mg by mouth every 6 (six) hours as needed for moderate pain or headache.     Biotin 1 MG CAPS Take by mouth.     Cholecalciferol (DIALYVITE VITAMIN D 5000) 125 MCG (5000 UT) capsule Take 5,000 Units by mouth daily. Vit D and K supplement combined     diazepam (VALIUM) 5 MG tablet Place 1 tablet vaginally nightly as needed for muscle spasm/ pelvic pain. 30 tablet 0   estradiol (  ESTRACE) 0.1 MG/GM vaginal cream PLACE 0.5G NIGHTLY FOR TWO WEEKS THEN TWICE A WEEK AFTER 126 g 4   gabapentin (NEURONTIN) 100 MG capsule Take 1 capsule (100 mg total) by mouth 3 (three) times daily. 90 capsule 2   hydrOXYzine (ATARAX) 10 MG tablet TAKE 1 TABLET (10 MG TOTAL) BY MOUTH DAILY AS NEEDED FOR ANXIETY, MAY TAKE 2 TIMES A DAY IF NEEDED 180 tablet 1   loratadine (CLARITIN) 10 MG tablet Take 10 mg by mouth daily.     omeprazole (PRILOSEC) 20 MG capsule Take 1 capsule (20 mg total) by mouth daily. 90 capsule 1   tamoxifen (NOLVADEX) 20 MG tablet TAKE 1 TABLET BY MOUTH EVERY DAY 90 tablet 1   tretinoin (RETIN-A) 0.025 % cream Apply 1 application. topically at bedtime.     No current facility-administered medications for this visit.     PHYSICAL EXAMINATION: ECOG PERFORMANCE STATUS: 0 - Asymptomatic  Vitals:   11/16/22 0831  BP: 117/75  Pulse: 81  Resp: 16  Temp: 98.2 F (36.8 C)  SpO2: 98%   Wt Readings from Last 3 Encounters:  11/16/22 123 lb 4.8 oz (55.9 kg)  10/31/22 121 lb (54.9 kg)  09/12/22 121 lb 6.4 oz (55.1 kg)     GENERAL:alert, no distress and comfortable SKIN: skin color, texture, turgor are normal, no rashes or significant lesions EYES: normal, Conjunctiva are pink and non-injected, sclera clear NECK: supple, thyroid normal size, non-tender, without nodularity LYMPH:  no palpable lymphadenopathy in the cervical, axillary  LUNGS: clear to auscultation and percussion with normal breathing effort HEART: regular rate & rhythm and no murmurs and no lower extremity edema ABDOMEN:abdomen soft, non-tender and normal bowel sounds Musculoskeletal:no cyanosis of digits and no clubbing  NEURO: alert & oriented x 3 with fluent speech, no focal motor/sensory deficits Breast exam showed left mastectomy with reconstruction, no palpable mass or adenopathy on both side, no tenderness on chest wall especially in the left rib cage area  LABORATORY DATA:  I have reviewed the data as listed    Latest Ref Rng & Units 11/16/2022    8:17 AM 08/17/2022    8:52 AM 05/27/2022    1:38 PM  CBC  WBC 4.0 - 10.5 K/uL 2.7  2.8  3.3   Hemoglobin 12.0 - 15.0 g/dL 95.6  21.3  08.6   Hematocrit 36.0 - 46.0 % 40.3  40.5  37.0   Platelets 150 - 400 K/uL 186  176  203         Latest Ref Rng & Units 08/17/2022    8:52 AM 05/27/2022    1:38 PM 04/29/2022    1:25 PM  CMP  Glucose 70 - 99 mg/dL 80  578  79   BUN 6 - 20 mg/dL 17  14  16    Creatinine 0.44 - 1.00 mg/dL 4.69  6.29  5.28   Sodium 135 - 145 mmol/L 141  139  140   Potassium 3.5 - 5.1 mmol/L 4.6  4.7  4.0   Chloride 98 - 111 mmol/L 105  104  104   CO2 22 - 32 mmol/L 29  27  28    Calcium 8.9 - 10.3 mg/dL 41.3  9.8  24.4   Total Protein 6.5 - 8.1 g/dL 7.5  7.0   7.1   Total Bilirubin 0.3 - 1.2 mg/dL 0.5  0.5  0.5   Alkaline Phos 38 - 126 U/L 43  52  48   AST 15 - 41 U/L  16  17  19    ALT 0 - 44 U/L 12  12  13        RADIOGRAPHIC STUDIES: I have personally reviewed the radiological images as listed and agreed with the findings in the report. No results found.    Orders Placed This Encounter  Procedures   Ambulatory referral to Physical Therapy    Referral Priority:   Routine    Referral Type:   Physical Medicine    Referral Reason:   Specialty Services Required    Requested Specialty:   Physical Therapy    Number of Visits Requested:   1   All questions were answered. The patient knows to call the clinic with any problems, questions or concerns. No barriers to learning was detected. The total time spent in the appointment was 30 minutes.     Malachy Mood, MD 11/16/2022

## 2022-11-16 NOTE — Assessment & Plan Note (Addendum)
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 Biopsy on 11/05/20 confirmed invasive ductal carcinoma, with metastatic involvement of one lymph node. -staging CT CAP and bone scan on 11/19/20 showed no distant metastasis -she underwent left mastectomy on 01/06/21 under Dr. Donell Beers. Pathology showed 6.5 cm invasive ductal carcinoma, grade 2, involving dermis of nipple without epidermal involvement. Margins were negative, but all 4 lymph nodes showed metastatic disease. -s/p 4 cycles AC 02/11/21 - 03/25/21 and Abraxane 04/08/21 - 06/24/21. -PET scan on 06/16/21 showed no convincing evidence of local recurrence or metastatic disease. -left ALND on 07/07/21 with Dr. Donell Beers, path showed 1/11 positive nodes -s/p postmastectomy radiation by Dr. Mitzi Hansen 08/16/21 - 10/04/21. -she started Zoladex in 07/07/21 and letrozole on 07/21/21. She developed worsening joint pain and was switched to exemestane in early 10/2021. But due to worsening joint pain, urinary sx, and negative QOL we switched to Tamoxifen (and stopped zoladex) in 04/2022  -she started Verzenio 100mg  BID on 10/28/21, dose increased to 150mg  bid later. She had UTI, fatigue etc, but overall tolerating better now -she had reconsult surgery on February 18, 2022, Verzenio was held before surgery.

## 2022-11-16 NOTE — Progress Notes (Signed)
[facility] ordered and backup documentation faxed to [employer].  Pt will call [facility] to scheduled appt for mobile phlebotomy for liquid biopsy specimen.  Fax confirmation received.

## 2022-11-16 NOTE — Therapy (Signed)
OUTPATIENT PHYSICAL THERAPY  UPPER EXTREMITY ONCOLOGY EVALUATION  Patient Name: Pamela Reyes MRN: [id number] DOB:1980-11-06, 42 y.o., female Today's Date: 11/16/2022  END OF SESSION:   Past Medical History:  Diagnosis Date   Anxiety    Axillary pain, right 01/18/2021   Will get right axillary ultrasound.   Breast cancer (HCC)    Breast pain, left 10/06/2020   Encounter for gynecological examination with Papanicolaou smear of cervix 02/10/2021   Encounter for screening fecal occult blood testing 02/10/2021   Family history of breast cancer 11/11/2020   Fibroid 10/27/2020   Has 1 x .9 x .6 cm subserosal fibroid   H/O left mastectomy    left mastectomy 2022   History of hiatal hernia    Left breast mass 10/06/2020   +breast cancer   LLQ pain 10/06/2020   PONV (postoperative nausea and vomiting)    Port-A-Cath in place 03/25/2021   Past Surgical History:  Procedure Laterality Date   AXILLARY LYMPH NODE DISSECTION Left 07/07/2021   Procedure: LEFT AXILLARY LYMPH NODE DISSECTION;  Surgeon: Almond Lint, MD;  Location: [facility];  Service: General;  Laterality: Left;   BREAST RECONSTRUCTION WITH PLACEMENT OF TISSUE EXPANDER AND FLEX HD (ACELLULAR HYDRATED DERMIS) Left 01/06/2021   Procedure: LEFT BREAST RECONSTRUCTION WITH PLACEMENT OF TISSUE EXPANDER AND ACELLULAR  DERMIS;  Surgeon: Glenna Fellows, MD;  Location: [facility];  Service: Plastics;  Laterality: Left;   MASTECTOMY W/ SENTINEL NODE BIOPSY Left 01/06/2021   Procedure: LEFT MASTECTOMY WITH MAGTRACE;  Surgeon: Almond Lint, MD;  Location: [facility];  Service: General;  Laterality: Left;   PORT-A-CATH REMOVAL Right 07/07/2021   Procedure: REMOVAL PORT-A-CATH;  Surgeon: Almond Lint, MD;  Location: [facility];  Service: General;  Laterality: Right;   PORTACATH PLACEMENT Right 01/19/2021   Procedure: INSERTION PORT-A-CATH;  Surgeon: Almond Lint, MD;  Location: [facility];  Service: General;   Laterality: Right;   RADIOACTIVE SEED GUIDED AXILLARY SENTINEL LYMPH NODE Left 01/06/2021   Procedure: RADIOACTIVE SEED GUIDED SENTINEL LYMPH NODE BIOPSY;  Surgeon: Almond Lint, MD;  Location: [facility];  Service: General;  Laterality: Left;   REMOVAL OF TISSUE EXPANDER AND PLACEMENT OF IMPLANT Left 02/18/2022   Procedure: REMOVAL OF TISSUE EXPANDER AND PLACEMENT OF IMPLANT;  Surgeon: Glenna Fellows, MD;  Location: [facility];  Service: Plastics;  Laterality: Left;   SENTINEL NODE BIOPSY Left 01/06/2021   Procedure: LEFT AXILLARY SENTINEL LYMPH  NODE BIOPSY;  Surgeon: Almond Lint, MD;  Location: [facility];  Service: General;  Laterality: Left;   WISDOM TOOTH EXTRACTION     Patient Active Problem List   Diagnosis Date Noted   History of breast cancer 06/02/2022   Vaginal atrophy 06/02/2022   Burning with urination 10/25/2021   Urinary frequency 10/25/2021   Urgency of urination 10/25/2021   Left flank pain, chronic 09/27/2021   Paresthesia 06/08/2021   Port-A-Cath in place 03/25/2021   Encounter for screening fecal occult blood testing 02/10/2021   Encounter for gynecological examination with Papanicolaou smear of cervix 02/10/2021   Genetic testing 11/25/2020   Family history of breast cancer 11/11/2020   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 lymph node, left (HCC) 11/11/2020   Dense breasts 11/11/2020   Malignant neoplasm of upper-outer quadrant of left breast in female, estrogen receptor positive (HCC) 11/10/2020   Fibroid 10/27/2020    PCP: Marland Kitchen  REFERRING PROVIDER: Dr. Mosetta Putt  REFERRING DIAG: Left chest wall pain s/p Left mastectomy with SLNB, ALND  THERAPY DIAG:  No diagnosis found.  ONSET DATE: ***  Rationale for Evaluation and Treatment: Rehabilitation  SUBJECTIVE:                                                                                                                                                                                            SUBJECTIVE STATEMENT: ***  PERTINENT HISTORY:  Diagnosed 10/29/2020 and found by self exam. Biopsy revealed IDC ER, PR +, HER 2 -.  Pt underwent left mastectomy on 01/06/2021 with expander. 4+/5 LN. Chemotherapy completed ACP, Lymph node dissection on 07/07/21 with 1/11 lymph nodes positive. Completed radiation at end of August 2023   PAIN:  Are you having pain? {yes/no:20286} NPRS scale: ***/10 Pain location: *** Pain orientation: {Pain Orientation:25161}  PAIN TYPE: {type:313116} Pain description: {PAIN DESCRIPTION:21022940}  Aggravating factors: *** Relieving factors: ***  PRECAUTIONS: Left UE lymphedema risk  RED FLAGS: {PT Red Flags:29287}   WEIGHT BEARING RESTRICTIONS: No  FALLS:  Has patient fallen in last 6 months? {fallsyesno:27318}  LIVING ENVIRONMENT: Lives with: her partner Lives in: House/apartment   OCCUPATION: works with used books  LEISURE: ***  HAND DOMINANCE: right   PRIOR LEVEL OF FUNCTION: Independent  PATIENT GOALS: ***   OBJECTIVE: Note: Objective measures were completed at Evaluation unless otherwise noted.  COGNITION: Overall cognitive status: Within functional limits for tasks assessed   PALPATION: ***  OBSERVATIONS / OTHER ASSESSMENTS: ***  SENSATION: Light touch: Deficits ***  POSTURE: forward head, rounded shoulders  UPPER EXTREMITY AROM/PROM:  A/PROM RIGHT   eval   Shoulder extension   Shoulder flexion   Shoulder abduction   Shoulder internal rotation   Shoulder external rotation     (Blank rows = not tested)  A/PROM LEFT   eval  Shoulder extension   Shoulder flexion   Shoulder abduction   Shoulder internal rotation   Shoulder external rotation     (Blank rows = not tested)  CERVICAL AROM: All within normal limits:      UPPER EXTREMITY STRENGTH:   LYMPHEDEMA ASSESSMENTS:   SURGERY TYPE/DATE: 01/06/21 L mastectomy and SLNB  NUMBER OF LYMPH NODES REMOVED: SLNB 4/5, then ALND 07/07/2021  1+/11  CHEMOTHERAPY: completed 06/24/2021  RADIATION::Completed 10/04/21   HORMONE TREATMENT: Tamoxifen, Verzinio  INFECTIONS: NONE   LYMPHEDEMA ASSESSMENTS:   LANDMARK RIGHT  eval  At axilla    15 cm proximal to olecranon process   10 cm proximal to olecranon process   Olecranon process   15 cm proximal to ulnar styloid process   10 cm proximal to ulnar styloid process   Just proximal to ulnar styloid process  Across hand at thumb web space   At base of 2nd digit   (Blank rows = not tested)  LANDMARK LEFT  eval  At axilla    15 cm proximal to olecranon process   10 cm proximal to olecranon process   Olecranon process   15 cm proximal to ulnar styloid process   10 cm proximal to ulnar styloid process   Just proximal to ulnar styloid process   Across hand at thumb web space   At base of 2nd digit   (Blank rows = not tested)   FUNCTIONAL TESTS:  {Functional tests:24029}  GAIT: WNL   L-DEX LYMPHEDEMA SCREENING: The patient was assessed using the L-Dex machine today to produce a lymphedema index baseline score. The patient will be reassessed on a regular basis (typically every 3 months) to obtain new L-Dex scores. If the score is > 6.5 points away from his/her baseline score indicating onset of subclinical lymphedema, it will be recommended to wear a compression garment for 4 weeks, 12 hours per day and then be reassessed. If the score continues to be > 6.5 points from baseline at reassessment, we will initiate lymphedema treatment. Assessing in this manner has a 95% rate of preventing clinically significant lymphedema.  QUICK DASH SURVEY: ***   TODAY'S TREATMENT:                                                                                                                                          DATE:  11/17/2022    PATIENT EDUCATION:  Education details: *** Person educated: {Person educated:25204} Education method: {Education Method:25205} Education  comprehension: {Education Comprehension:25206}  HOME EXERCISE PROGRAM: ***  ASSESSMENT:  CLINICAL IMPRESSION: Patient is a 42 y.o. female who was seen today for physical therapy evaluation and treatment for complaints of left chest wall pain/rib pain. Pt is s/p Left mastectomy and SLNB 4+/5, then ALND 07/07/2021 1+/11 LN's She ended radiation 10/04/2021.   OBJECTIVE IMPAIRMENTS: {opptimpairments:25111}.   ACTIVITY LIMITATIONS: {activitylimitations:27494}  PARTICIPATION LIMITATIONS: {participationrestrictions:25113}  PERSONAL FACTORS: 3+ comorbidities: Left Breast Cancer s/p chemo and radiation  are also affecting patient's functional outcome.   REHAB POTENTIAL: {rehabpotential:25112}  CLINICAL DECISION MAKING: {clinical decision making:25114}  EVALUATION COMPLEXITY: {Evaluation complexity:25115}  GOALS: Goals reviewed with patient? {yes/no:20286}  SHORT TERM GOALS=LONG TERM GOALS: Target date: ***  *** Baseline: Goal status: INITIAL  2.  *** Baseline:  Goal status: INITIAL  3.  *** Baseline:  Goal status: INITIAL  4.  *** Baseline:  Goal status: INITIAL  5.  *** Baseline:  Goal status: INITIAL  6.  *** Baseline:  Goal status: INITIAL   PLAN:  PT FREQUENCY: {rehab frequency:25116}  PT DURATION: {rehab duration:25117}  PLANNED INTERVENTIONS: 97146- PT Re-evaluation, 97110-Therapeutic exercises, 97112- Neuromuscular re-education, 97535- Self Care, 84132- Manual therapy, 97760- Orthotic Fit/training, Patient/Family education, Balance training, Joint mobilization, Therapeutic exercises, Neuromuscular re-education, and Self Care, Dry  Needling  PLAN FOR NEXT SESSION: ***  Waynette Buttery, PT 11/16/2022, 5:54 PM

## 2022-11-17 ENCOUNTER — Ambulatory Visit: Payer: BC Managed Care – PPO | Attending: Hematology

## 2022-11-17 ENCOUNTER — Other Ambulatory Visit: Payer: Self-pay

## 2022-11-17 DIAGNOSIS — R0789 Other chest pain: Secondary | ICD-10-CM | POA: Diagnosis not present

## 2022-11-17 DIAGNOSIS — C50412 Malignant neoplasm of upper-outer quadrant of left female breast: Secondary | ICD-10-CM | POA: Insufficient documentation

## 2022-11-17 DIAGNOSIS — Z170 Estrogen receptor positive status [ER+]: Secondary | ICD-10-CM

## 2022-11-29 ENCOUNTER — Ambulatory Visit: Payer: BC Managed Care – PPO

## 2022-11-29 DIAGNOSIS — Z170 Estrogen receptor positive status [ER+]: Secondary | ICD-10-CM

## 2022-11-29 DIAGNOSIS — R0789 Other chest pain: Secondary | ICD-10-CM

## 2022-11-29 DIAGNOSIS — C50412 Malignant neoplasm of upper-outer quadrant of left female breast: Secondary | ICD-10-CM | POA: Diagnosis not present

## 2022-11-29 NOTE — Therapy (Signed)
OUTPATIENT PHYSICAL THERAPY  UPPER EXTREMITY ONCOLOGY EVALUATION  Patient Name: Pamela Reyes MRN: [id number] DOB:1980-03-03, 42 y.o., female Today's Date: 11/29/2022  END OF SESSION:  PT End of Session - 11/29/22 1600     Visit Number 2    Number of Visits 12    Date for PT Re-Evaluation 12/29/22    PT Start Time 1602    PT Stop Time 1656    PT Time Calculation (min) 54 min    Activity Tolerance Patient tolerated treatment well    Behavior During Therapy [facility] for tasks assessed/performed             Past Medical History:  Diagnosis Date   Anxiety    Axillary pain, right 01/18/2021   Will get right axillary ultrasound.   Breast cancer (HCC)    Breast pain, left 10/06/2020   Encounter for gynecological examination with Papanicolaou smear of cervix 02/10/2021   Encounter for screening fecal occult blood testing 02/10/2021   Family history of breast cancer 11/11/2020   Fibroid 10/27/2020   Has 1 x .9 x .6 cm subserosal fibroid   H/O left mastectomy    left mastectomy 2022   History of hiatal hernia    Left breast mass 10/06/2020   +breast cancer   LLQ pain 10/06/2020   PONV (postoperative nausea and vomiting)    Port-A-Cath in place 03/25/2021   Past Surgical History:  Procedure Laterality Date   AXILLARY LYMPH NODE DISSECTION Left 07/07/2021   Procedure: LEFT AXILLARY LYMPH NODE DISSECTION;  Surgeon: Almond Lint, MD;  Location: [facility];  Service: General;  Laterality: Left;   BREAST RECONSTRUCTION WITH PLACEMENT OF TISSUE EXPANDER AND FLEX HD (ACELLULAR HYDRATED DERMIS) Left 01/06/2021   Procedure: LEFT BREAST RECONSTRUCTION WITH PLACEMENT OF TISSUE EXPANDER AND ACELLULAR  DERMIS;  Surgeon: Glenna Fellows, MD;  Location: [facility];  Service: Plastics;  Laterality: Left;   MASTECTOMY W/ SENTINEL NODE BIOPSY Left 01/06/2021   Procedure: LEFT MASTECTOMY WITH MAGTRACE;  Surgeon: Almond Lint, MD;  [facility]: [facility];  Service: General;  Laterality:  Left;   PORT-A-CATH REMOVAL Right 07/07/2021   Procedure: REMOVAL PORT-A-CATH;  Surgeon: Almond Lint, MD;  Location: [facility];  Service: General;  Laterality: Right;   PORTACATH PLACEMENT Right 01/19/2021   Procedure: INSERTION PORT-A-CATH;  Surgeon: Almond Lint, MD;  [facility]: [facility];  Service: General;  Laterality: Right;   RADIOACTIVE SEED GUIDED AXILLARY SENTINEL LYMPH NODE Left 01/06/2021   Procedure: RADIOACTIVE SEED GUIDED SENTINEL LYMPH NODE BIOPSY;  Surgeon: Almond Lint, MD;  Location: [facility];  Service: General;  Laterality: Left;   REMOVAL OF TISSUE EXPANDER AND PLACEMENT OF IMPLANT Left 02/18/2022   Procedure: REMOVAL OF TISSUE EXPANDER AND PLACEMENT OF IMPLANT;  Surgeon: Glenna Fellows, MD;  Location: [facility];  Service: Plastics;  Laterality: Left;   SENTINEL NODE BIOPSY Left 01/06/2021   Procedure: LEFT AXILLARY SENTINEL LYMPH  NODE BIOPSY;  Surgeon: Almond Lint, MD;  [facility]: [facility];  Service: General;  Laterality: Left;   WISDOM TOOTH EXTRACTION     Patient Active Problem List   Diagnosis Date Noted   History of breast cancer 06/02/2022   Vaginal atrophy 06/02/2022   Burning with urination 10/25/2021   Urinary frequency 10/25/2021   Urgency of urination 10/25/2021   Left flank pain, chronic 09/27/2021   Paresthesia 06/08/2021   Port-A-Cath in place 03/25/2021   Encounter for screening fecal occult blood testing 02/10/2021   Encounter for gynecological  examination with Papanicolaou smear of cervix 02/10/2021   Genetic testing 11/25/2020   Family history of breast cancer 11/11/2020   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 lymph node, left (HCC) 11/11/2020   Dense breasts 11/11/2020   Malignant neoplasm of upper-outer quadrant of left breast in female, estrogen receptor positive (HCC) 11/10/2020   Fibroid 10/27/2020    PCP: Dulce Sellar, NP  REFERRING PROVIDER: Dr. Mosetta Putt  REFERRING DIAG: Left chest wall pain s/p Left  mastectomy with SLNB, ALND  THERAPY DIAG:  Malignant neoplasm of upper-outer quadrant of left breast in female, estrogen receptor positive (HCC)  Left-sided chest wall pain  ONSET DATE: 1 yr ago  Rationale for Evaluation and Treatment: Rehabilitation  SUBJECTIVE:                                                                                                                                                                                           SUBJECTIVE STATEMENT:   11/29/2022 I have been doing the stretches  1x per day but I really can't tell  if its helping.Sometimes it feels better overall and sometimes it still hurts a lot. Frequency of the pain is about the same, maybe a little less. No soreness after stretches. I have a little minor pain today.   EVAL I have had this for over a year and it has gotten worse. I think it started before I was diagnosed but I am not sure. Its non descript. It comes and goes. I had a CT and bone scan and they were fine.sometimes it feels like nerve pain with direct pressure. It may be there a few hours and calm down with rest, or sometimes its there all day.  Its not tender at all.When I wake up in the morning I am usually fine. I dont' think exercises will help but Dr. Mosetta Putt wanted me to come.  PERTINENT HISTORY:  Diagnosed 10/29/2020 and found by self exam. Biopsy revealed IDC ER, PR +, HER 2 -.  Pt underwent left mastectomy on 01/06/2021 with expander. 4+/5 LN. Chemotherapy completed ACP, Lymph node dissection on 07/07/21 with 1/11 lymph nodes positive. Completed radiation at end of August 2023 . Had expander exchange in Jan 2024.  PAIN:  Are you having pain? Yes NPRS scale: 3 /10 present Pain location: Left lower ribs Pain orientation: Left  PAIN TYPE: tight and achy, tight Pain description: intermittent  Aggravating factors: activity at [facility], stress Relieving factors: rest  PRECAUTIONS: Left UE lymphedema risk  RED  FLAGS: None   WEIGHT BEARING RESTRICTIONS: No  FALLS:  Has patient fallen in last 6 months? No  LIVING ENVIRONMENT: Lives with:  her partner Lives in: House/apartment   OCCUPATION: works with used books  LEISURE:   HAND DOMINANCE: right   PRIOR LEVEL OF FUNCTION: Independent  PATIENT GOALS: Decrease left Rib pain   OBJECTIVE: Note: Objective measures were completed at Evaluation unless otherwise noted.  COGNITION: Overall cognitive status: Within functional limits for tasks assessed   PALPATION: Palpable shortened areas in left intercostals with some tenderness  OBSERVATIONS / OTHER ASSESSMENTS: Tightness in left lateral trunk and ribs with good morning stretch, LTR, open book stretch, slightly decreased left shoulder ROM  SENSATION: Light touch: Deficits    POSTURE: forward head, rounded shoulders  UPPER EXTREMITY AROM/PROM:  A/PROM RIGHT   eval   Shoulder extension   Shoulder flexion 158  Shoulder abduction 167  Shoulder internal rotation   Shoulder external rotation     (Blank rows = not tested)  A/PROM LEFT   eval  Shoulder extension   Shoulder flexion 152  Shoulder abduction 160 pulls lateral trunk  Shoulder internal rotation   Shoulder external rotation     (Blank rows = not tested)  CERVICAL AROM: All within normal limits:   SB arm over head to left; no pain, to right tight but no pain   UPPER EXTREMITY STRENGTH:   LYMPHEDEMA ASSESSMENTS:   SURGERY TYPE/DATE: 01/06/21 L mastectomy and SLNB  NUMBER OF LYMPH NODES REMOVED: SLNB 4/5, then ALND 07/07/2021 1+/11  CHEMOTHERAPY: completed 06/24/2021  RADIATION::Completed 10/04/21   HORMONE TREATMENT: Tamoxifen, Verzinio  INFECTIONS: NONE   LYMPHEDEMA ASSESSMENTS:   LANDMARK RIGHT  eval  At axilla    15 cm proximal to olecranon process   10 cm proximal to olecranon process   Olecranon process   15 cm proximal to ulnar styloid process   10 cm proximal to ulnar styloid process    Just proximal to ulnar styloid process   Across hand at thumb web space   At base of 2nd digit   (Blank rows = not tested)  LANDMARK LEFT  eval  At axilla    15 cm proximal to olecranon process   10 cm proximal to olecranon process   Olecranon process   15 cm proximal to ulnar styloid process   10 cm proximal to ulnar styloid process   Just proximal to ulnar styloid process   Across hand at thumb web space   At base of 2nd digit   (Blank rows = not tested)   FUNCTIONAL TESTS:    GAIT: WNL    QUICK DASH SURVEY: NA   TODAY'S TREATMENT:                                                                                                                                          DATE:  11/29/2022 Wall arc to right x 3-4 reps Standing morning stretch x 3 Quadruped walk hands to right x 3 Open book stretch x 3  LTR x 2 ea (didn't really feel stretch) MFR in right SL to left ribcage and lateral trunk, added deep breathing Soft tissue with cocoa butter to Left lower ribs and between ribs without tenderness, but several shortened areas noted Right SL over green ball on Mat table with PT assist and pts left arm over head and right arm on table and PT applying SB stretch 3 x 20-3- sec.  11/17/2022 Discussed POC and treatment interventions. Started on HEP - Supine Lower Trunk Rotation  - 2 x daily - 7 x weekly - 1sets -  3reps - Sidelying Thoracic Lumbar Rotation  - 2 x daily - 7 x weekly - 1 sets - 3 reps Good morning stretch 1 set 3 reps  PATIENT EDUCATION:  Education details: Access Code: [email or web address] URL: [email or web address] Date: 11/17/2022 Prepared by: Alvira Monday  Exercises - Supine Lower Trunk Rotation  - 2 x daily - 7 x weekly - 1sets -  reps - Sidelying Thoracic Lumbar Rotation  - 2 x daily - 7 x weekly - 1 sets - 3 reps Good morning stretch Person educated: Patient Education method: Explanation, Demonstration, and Handouts Education comprehension:  verbalized understanding and returned demonstration  HOME EXERCISE PROGRAM: Gentle stretch:good morning, Lower trunk rotation, open book stretch  ASSESSMENT:  CLINICAL IMPRESSION: Pt has only been getting stretches in 1x/day usually before bed time. She thinks it may be a little better, but isn't sure. Advised to get stretches in 2x/day and showed her some standing stretches  today that she can do  at work pretty easily. She felt better stretch with newer activities today, and has loosened up pretty well with previous stretches.   OBJECTIVE IMPAIRMENTS: decreased activity tolerance, decreased knowledge of condition, decreased ROM, and impaired UE functional use.   ACTIVITY LIMITATIONS: carrying, lifting, reach over head, and reaching to the right  PARTICIPATION LIMITATIONS:  she does all that is required of her with intermittent left rib pain  PERSONAL FACTORS: 3+ comorbidities: Left Breast Cancer s/p chemo and radiation  are also affecting patient's functional outcome.   REHAB POTENTIAL: Good  CLINICAL DECISION MAKING: Stable/uncomplicated  EVALUATION COMPLEXITY: Low  GOALS: Goals reviewed with patient? Yes  SHORT TERM GOALS=LONG TERM GOALS: Target date: 12/29/2022  Pt will be independent with a HEP for gentle stretching/strengthening Baseline: Goal status: INITIAL  2.  Pt will note decrease frequency and intensity of pain by 30-50% or greater Baseline:  Goal status: INITIAL  3.  Decreased tenderness/shortened areas in intercostals noted with manual therapy Baseline:  Goal status: INITIAL  4.  Pt will improve left shoulder flexion and scaption by 3-5 degrees Baseline:  Goal status: INITIAL   PLAN:  PT FREQUENCY: 1-2x/week  PT DURATION: 6 weeks  PLANNED INTERVENTIONS: 97146- PT Re-evaluation, 97110-Therapeutic exercises, 97112- Neuromuscular re-education, 97535- Self Care, 87564- Manual therapy, 97760- Orthotic Fit/training, Patient/Family education, Balance  training, Joint mobilization, Therapeutic exercises, Neuromuscular re-education, and Self Care, Dry Needling  PLAN FOR NEXT SESSION: How are stretches going/pain? Soft tissue mobilization/MFR to left ribs, review HEP,  progress to strength,  K tape?DN? Abduction ball rolls on wall  Waynette Buttery, PT 11/29/2022, 5:01 PM

## 2022-12-01 ENCOUNTER — Ambulatory Visit: Payer: BC Managed Care – PPO

## 2022-12-06 ENCOUNTER — Ambulatory Visit: Payer: BC Managed Care – PPO

## 2022-12-06 DIAGNOSIS — C50412 Malignant neoplasm of upper-outer quadrant of left female breast: Secondary | ICD-10-CM | POA: Diagnosis not present

## 2022-12-06 DIAGNOSIS — R0789 Other chest pain: Secondary | ICD-10-CM

## 2022-12-06 DIAGNOSIS — Z170 Estrogen receptor positive status [ER+]: Secondary | ICD-10-CM

## 2022-12-06 NOTE — Therapy (Signed)
OUTPATIENT PHYSICAL THERAPY  UPPER EXTREMITY ONCOLOGY TREATMENT  Patient Name: Pamela Reyes MRN: [id number] DOB:[birth year], 42 y.o., female Today's Date: 12/06/2022  END OF SESSION:  PT End of Session - 12/06/22 0859     Visit Number 3    Number of Visits 12    Date for PT Re-Evaluation 12/29/22    PT Start Time 0900    PT Stop Time 0946    PT Time Calculation (min) 46 min    Activity Tolerance Patient tolerated treatment well    Behavior During Therapy [facility] for tasks assessed/performed             Past Medical History:  Diagnosis Date   Anxiety    Axillary pain, right 01/18/2021   Will get right axillary ultrasound.   Breast cancer (HCC)    Breast pain, left 10/06/2020   Encounter for gynecological examination with Papanicolaou smear of cervix 02/10/2021   Encounter for screening fecal occult blood testing 02/10/2021   Family history of breast cancer 11/11/2020   Fibroid 10/27/2020   Has 1 x .9 x .6 cm subserosal fibroid   H/O left mastectomy    left mastectomy 2022   History of hiatal hernia    Left breast mass 10/06/2020   +breast cancer   LLQ pain 10/06/2020   PONV (postoperative nausea and vomiting)    Port-A-Cath in place 03/25/2021   Past Surgical History:  Procedure Laterality Date   AXILLARY LYMPH NODE DISSECTION Left 07/07/2021   Procedure: LEFT AXILLARY LYMPH NODE DISSECTION;  Surgeon: Almond Lint, MD;  Location: [facility];  Service: General;  Laterality: Left;   BREAST RECONSTRUCTION WITH PLACEMENT OF TISSUE EXPANDER AND FLEX HD (ACELLULAR HYDRATED DERMIS) Left 01/06/2021   Procedure: LEFT BREAST RECONSTRUCTION WITH PLACEMENT OF TISSUE EXPANDER AND ACELLULAR  DERMIS;  Surgeon: Glenna Fellows, MD;  Location: [facility];  Service: Plastics;  Laterality: Left;   MASTECTOMY W/ SENTINEL NODE BIOPSY Left 01/06/2021   Procedure: LEFT MASTECTOMY WITH MAGTRACE;  Surgeon: Almond Lint, MD;  Location: [facility];  Service: General;  Laterality: Left;    PORT-A-CATH REMOVAL Right 07/07/2021   Procedure: REMOVAL PORT-A-CATH;  Surgeon: Almond Lint, MD;  Location: [facility];  Service: General;  Laterality: Right;   PORTACATH PLACEMENT Right 01/19/2021   Procedure: INSERTION PORT-A-CATH;  Surgeon: Almond Lint, MD;  Location: [facility];  Service: General;  Laterality: Right;   RADIOACTIVE SEED GUIDED AXILLARY SENTINEL LYMPH NODE Left 01/06/2021   Procedure: RADIOACTIVE SEED GUIDED SENTINEL LYMPH NODE BIOPSY;  Surgeon: Almond Lint, MD;  Location: [facility];  Service: General;  Laterality: Left;   REMOVAL OF TISSUE EXPANDER AND PLACEMENT OF IMPLANT Left 02/18/2022   Procedure: REMOVAL OF TISSUE EXPANDER AND PLACEMENT OF IMPLANT;  Surgeon: Glenna Fellows, MD;  Location: [facility];  Service: Plastics;  Laterality: Left;   SENTINEL NODE BIOPSY Left 01/06/2021   Procedure: LEFT AXILLARY SENTINEL LYMPH  NODE BIOPSY;  Surgeon: Almond Lint, MD;  Location: [facility];  Service: General;  Laterality: Left;   WISDOM TOOTH EXTRACTION     Patient Active Problem List   Diagnosis Date Noted   History of breast cancer 06/02/2022   Vaginal atrophy 06/02/2022   Burning with urination 10/25/2021   Urinary frequency 10/25/2021   Urgency of urination 10/25/2021   Left flank pain, chronic 09/27/2021   Paresthesia 06/08/2021   Port-A-Cath in place 03/25/2021   Encounter for screening fecal occult blood testing 02/10/2021   Encounter for gynecological  examination with Papanicolaou smear of cervix 02/10/2021   Genetic testing 11/25/2020   Family history of breast cancer 11/11/2020   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 lymph node, left (HCC) 11/11/2020   Dense breasts 11/11/2020   Malignant neoplasm of upper-outer quadrant of left breast in female, estrogen receptor positive (HCC) 11/10/2020   Fibroid 10/27/2020    PCP: Dulce Sellar, NP  REFERRING PROVIDER: Dr. Mosetta Putt  REFERRING DIAG: Left chest wall pain s/p Left mastectomy  with SLNB, ALND  THERAPY DIAG:  Malignant neoplasm of upper-outer quadrant of left breast in female, estrogen receptor positive (HCC)  Left-sided chest wall pain  ONSET DATE: 1 yr ago  Rationale for Evaluation and Treatment: Rehabilitation  SUBJECTIVE:                                                                                                                                                                                           SUBJECTIVE STATEMENT:   12/07/2022 I feel like it might be little better. I have been stretches 2x/day. Not feeling the pain throughout the day as much and its less intense, but I still feel it when I stretch. I did OK after last visit. I didn't feel it at all this weekend, but yesterday had it just a little   EVAL I have had this for over a year and it has gotten worse. I think it started before I was diagnosed but I am not sure. Its non descript. It comes and goes. I had a CT and bone scan and they were fine.sometimes it feels like nerve pain with direct pressure. It may be there a few hours and calm down with rest, or sometimes its there all day.  Its not tender at all.When I wake up in the morning I am usually fine. I dont' think exercises will help but Dr. Mosetta Putt wanted me to come.  PERTINENT HISTORY:  Diagnosed 10/29/2020 and found by self exam. Biopsy revealed IDC ER, PR +, HER 2 -.  Pt underwent left mastectomy on 01/06/2021 with expander. 4+/5 LN. Chemotherapy completed ACP, Lymph node dissection on 07/07/21 with 1/11 lymph nodes positive. Completed radiation at end of August 2023 . Had expander exchange in Jan 2024.  PAIN:  Are you having pain? Yes NPRS scale: 0 /10 present, just tight Pain location: Left lower ribs Pain orientation: Left  PAIN TYPE: tight and achy, tight Pain description: intermittent  Aggravating factors: activity at [facility], stress Relieving factors: rest  PRECAUTIONS: Left UE lymphedema risk  RED  FLAGS: None   WEIGHT BEARING RESTRICTIONS: No  FALLS:  Has patient fallen in last 6 months? No  LIVING ENVIRONMENT: Lives with: her partner Lives in: House/apartment   OCCUPATION: works with used books  LEISURE:   HAND DOMINANCE: right   PRIOR LEVEL OF FUNCTION: Independent  PATIENT GOALS: Decrease left Rib pain   OBJECTIVE: Note: Objective measures were completed at Evaluation unless otherwise noted.  COGNITION: Overall cognitive status: Within functional limits for tasks assessed   PALPATION: Palpable shortened areas in left intercostals with some tenderness  OBSERVATIONS / OTHER ASSESSMENTS: Tightness in left lateral trunk and ribs with good morning stretch, LTR, open book stretch, slightly decreased left shoulder ROM  SENSATION: Light touch: Deficits    POSTURE: forward head, rounded shoulders  UPPER EXTREMITY AROM/PROM:  A/PROM RIGHT   eval   Shoulder extension   Shoulder flexion 158  Shoulder abduction 167  Shoulder internal rotation   Shoulder external rotation     (Blank rows = not tested)  A/PROM LEFT   eval  Shoulder extension   Shoulder flexion 152  Shoulder abduction 160 pulls lateral trunk  Shoulder internal rotation   Shoulder external rotation     (Blank rows = not tested)  CERVICAL AROM: All within normal limits:   SB arm over head to left; no pain, to right tight but no pain   UPPER EXTREMITY STRENGTH:   LYMPHEDEMA ASSESSMENTS:   SURGERY TYPE/DATE: 01/06/21 L mastectomy and SLNB  NUMBER OF LYMPH NODES REMOVED: SLNB 4/5, then ALND 07/07/2021 1+/11  CHEMOTHERAPY: completed 06/24/2021  RADIATION::Completed 10/04/21   HORMONE TREATMENT: Tamoxifen, Verzinio  INFECTIONS: NONE   LYMPHEDEMA ASSESSMENTS:   LANDMARK RIGHT  eval  At axilla    15 cm proximal to olecranon process   10 cm proximal to olecranon process   Olecranon process   15 cm proximal to ulnar styloid process   10 cm proximal to ulnar styloid process    Just proximal to ulnar styloid process   Across hand at thumb web space   At base of 2nd digit   (Blank rows = not tested)  LANDMARK LEFT  eval  At axilla    15 cm proximal to olecranon process   10 cm proximal to olecranon process   Olecranon process   15 cm proximal to ulnar styloid process   10 cm proximal to ulnar styloid process   Just proximal to ulnar styloid process   Across hand at thumb web space   At base of 2nd digit   (Blank rows = not tested)   FUNCTIONAL TESTS:    GAIT: WNL    QUICK DASH SURVEY: NA   TODAY'S TREATMENT:                                                                                                                                          DATE:  12/07/2022 Wall arc to right x 3 Morning stretch in standing x 3 Prayer stretch straight back and quadruped to the  right x 3 Arms in T with left leg crossed over 3 x 20 sec MFR in right SL over round bolster to left ribcage and lateral trunk  added deep breathing Soft tissue with cocoa butter to Left lower ribs and between ribs without tenderness, but several shortened areas noted Dynamic cupping to left rib area with traction, tilt, movement Right SL over green ball on Mat table with PT assist and pts left arm over head and right arm on table and PT assisting with stabilization prn 3 x 20-3- sec.   11/29/2022 Wall arc to right x 3-4 reps Standing morning stretch x 3 Quadruped walk hands to right x 3 Open book stretch x 3 LTR x 2 ea (didn't really feel stretch) MFR in right SL to left ribcage and lateral trunk, added deep breathing Soft tissue with cocoa butter to Left lower ribs and between ribs without tenderness, but several shortened areas noted Right SL over green ball on Mat table with PT assist and pts left arm over head and right arm on table and PT applying SB stretch 3 x 20-3- sec.  11/17/2022 Discussed POC and treatment interventions. Started on HEP - Supine Lower Trunk Rotation   - 2 x daily - 7 x weekly - 1sets -  3reps - Sidelying Thoracic Lumbar Rotation  - 2 x daily - 7 x weekly - 1 sets - 3 reps Good morning stretch 1 set 3 reps  PATIENT EDUCATION:  Education details: Access Code: [email or web address] URL: [email or web address] Date: 11/17/2022 Prepared by: Alvira Monday  Exercises - Supine Lower Trunk Rotation  - 2 x daily - 7 x weekly - 1sets -  reps - Sidelying Thoracic Lumbar Rotation  - 2 x daily - 7 x weekly - 1 sets - 3 reps Good morning stretch Person educated: Patient Education method: Explanation, Demonstration, and Handouts Education comprehension: verbalized understanding and returned demonstration  HOME EXERCISE PROGRAM: Gentle stretch:good morning, Lower trunk rotation, open book stretch  ASSESSMENT:  CLINICAL IMPRESSION: Continued exercises, MFR and STM. Initiated cupping today which pt really liked. She felt much looser after treatment today. She is starting to feel benefit with decreased intensity and frequency of pain.  OBJECTIVE IMPAIRMENTS: decreased activity tolerance, decreased knowledge of condition, decreased ROM, and impaired UE functional use.   ACTIVITY LIMITATIONS: carrying, lifting, reach over head, and reaching to the right  PARTICIPATION LIMITATIONS:  she does all that is required of her with intermittent left rib pain  PERSONAL FACTORS: 3+ comorbidities: Left Breast Cancer s/p chemo and radiation  are also affecting patient's functional outcome.   REHAB POTENTIAL: Good  CLINICAL DECISION MAKING: Stable/uncomplicated  EVALUATION COMPLEXITY: Low  GOALS: Goals reviewed with patient? Yes  SHORT TERM GOALS=LONG TERM GOALS: Target date: 12/29/2022  Pt will be independent with a HEP for gentle stretching/strengthening Baseline: Goal status:MET 12/07/2022 2.  Pt will note decrease frequency and intensity of pain by 30-50% or greater Baseline:  Goal status: INITIAL  3.  Decreased tenderness/shortened areas in  intercostals noted with manual therapy Baseline:  Goal status: INITIAL  4.  Pt will improve left shoulder flexion and scaption by 3-5 degrees Baseline:  Goal status: INITIAL   PLAN:  PT FREQUENCY: 1-2x/week  PT DURATION: 6 weeks  PLANNED INTERVENTIONS: 97146- PT Re-evaluation, 97110-Therapeutic exercises, 97112- Neuromuscular re-education, 97535- Self Care, [id number]- Manual therapy, 97760- Orthotic Fit/training, Patient/Family education, Balance training, Joint mobilization, Therapeutic exercises, Neuromuscular re-education, and Self Care, Dry Needling  PLAN FOR NEXT SESSION: Like cupping?How are stretches  going/pain? Soft tissue mobilization/MFR to left ribs, review HEP,  progress to strength,  K tape?DN? Abduction ball rolls on wall  Waynette Buttery, PT 12/06/2022, 9:50 AM

## 2022-12-20 ENCOUNTER — Ambulatory Visit: Payer: BC Managed Care – PPO | Attending: Hematology

## 2022-12-20 DIAGNOSIS — R0789 Other chest pain: Secondary | ICD-10-CM | POA: Insufficient documentation

## 2022-12-20 DIAGNOSIS — Z170 Estrogen receptor positive status [ER+]: Secondary | ICD-10-CM | POA: Diagnosis present

## 2022-12-20 DIAGNOSIS — C50412 Malignant neoplasm of upper-outer quadrant of left female breast: Secondary | ICD-10-CM | POA: Diagnosis present

## 2022-12-20 NOTE — Therapy (Signed)
OUTPATIENT PHYSICAL THERAPY  UPPER EXTREMITY ONCOLOGY TREATMENT  Patient Name: Pamela Reyes MRN: [id number] DOB:[birth year], 42 y.o., female Today's Date: 12/20/2022  END OF SESSION:  PT End of Session - 12/20/22 0757     Visit Number 4    Number of Visits 12    Date for PT Re-Evaluation 12/29/22    PT Start Time 0800    PT Stop Time 0847    PT Time Calculation (min) 47 min    Activity Tolerance Patient tolerated treatment well    Behavior During Therapy [facility] for tasks assessed/performed             Past Medical History:  Diagnosis Date   Anxiety    Axillary pain, right 01/18/2021   Will get right axillary ultrasound.   Breast cancer (HCC)    Breast pain, left 10/06/2020   Encounter for gynecological examination with Papanicolaou smear of cervix 02/10/2021   Encounter for screening fecal occult blood testing 02/10/2021   Family history of breast cancer 11/11/2020   Fibroid 10/27/2020   Has 1 x .9 x .6 cm subserosal fibroid   H/O left mastectomy    left mastectomy 2022   History of hiatal hernia    Left breast mass 10/06/2020   +breast cancer   LLQ pain 10/06/2020   PONV (postoperative nausea and vomiting)    Port-A-Cath in place 03/25/2021   Past Surgical History:  Procedure Laterality Date   AXILLARY LYMPH NODE DISSECTION Left 07/07/2021   Procedure: LEFT AXILLARY LYMPH NODE DISSECTION;  Surgeon: Almond Lint, MD;  Location: [facility];  Service: General;  Laterality: Left;   BREAST RECONSTRUCTION WITH PLACEMENT OF TISSUE EXPANDER AND FLEX HD (ACELLULAR HYDRATED DERMIS) Left 01/06/2021   Procedure: LEFT BREAST RECONSTRUCTION WITH PLACEMENT OF TISSUE EXPANDER AND ACELLULAR  DERMIS;  Surgeon: Glenna Fellows, MD;  Location: [facility];  Service: Plastics;  Laterality: Left;   MASTECTOMY W/ SENTINEL NODE BIOPSY Left 01/06/2021   Procedure: LEFT MASTECTOMY WITH MAGTRACE;  Surgeon: Almond Lint, MD;  Location: [facility];  Service: General;  Laterality: Left;    PORT-A-CATH REMOVAL Right 07/07/2021   Procedure: REMOVAL PORT-A-CATH;  Surgeon: Almond Lint, MD;  Location: [facility];  Service: General;  Laterality: Right;   PORTACATH PLACEMENT Right 01/19/2021   Procedure: INSERTION PORT-A-CATH;  Surgeon: Almond Lint, MD;  Location: [facility];  Service: General;  Laterality: Right;   RADIOACTIVE SEED GUIDED AXILLARY SENTINEL LYMPH NODE Left 01/06/2021   Procedure: RADIOACTIVE SEED GUIDED SENTINEL LYMPH NODE BIOPSY;  Surgeon: Almond Lint, MD;  Location: [facility];  Service: General;  Laterality: Left;   REMOVAL OF TISSUE EXPANDER AND PLACEMENT OF IMPLANT Left 02/18/2022   Procedure: REMOVAL OF TISSUE EXPANDER AND PLACEMENT OF IMPLANT;  Surgeon: Glenna Fellows, MD;  Location: [facility];  Service: Plastics;  Laterality: Left;   SENTINEL NODE BIOPSY Left 01/06/2021   Procedure: LEFT AXILLARY SENTINEL LYMPH  NODE BIOPSY;  Surgeon: Almond Lint, MD;  Location: [facility];  Service: General;  Laterality: Left;   WISDOM TOOTH EXTRACTION     Patient Active Problem List   Diagnosis Date Noted   History of breast cancer 06/02/2022   Vaginal atrophy 06/02/2022   Burning with urination 10/25/2021   Urinary frequency 10/25/2021   Urgency of urination 10/25/2021   Left flank pain, chronic 09/27/2021   Paresthesia 06/08/2021   Port-A-Cath in place 03/25/2021   Encounter for screening fecal occult blood testing 02/10/2021   Encounter for gynecological  examination with Papanicolaou smear of cervix 02/10/2021   Genetic testing 11/25/2020   Family history of breast cancer 11/11/2020   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 lymph node, left (HCC) 11/11/2020   Dense breasts 11/11/2020   Malignant neoplasm of upper-outer quadrant of left breast in female, estrogen receptor positive (HCC) 11/10/2020   Fibroid 10/27/2020    PCP: Dulce Sellar, NP  REFERRING PROVIDER: Dr. Mosetta Putt  REFERRING DIAG: Left chest wall pain s/p Left mastectomy  with SLNB, ALND  THERAPY DIAG:  Malignant neoplasm of upper-outer quadrant of left breast in female, estrogen receptor positive (HCC)  Left-sided chest wall pain  ONSET DATE: 1 yr ago  Rationale for Evaluation and Treatment: Rehabilitation  SUBJECTIVE:                                                                                                                                                                                           SUBJECTIVE STATEMENT:   12/20/2022 I was really sick last week. I didn't get to exercise too much, but got my stretches in except for a few days. I feel like I have regressed a little. It was pretty bad yesterday.  Feels pretty good this am.   EVAL I have had this for over a year and it has gotten worse. I think it started before I was diagnosed but I am not sure. Its non descript. It comes and goes. I had a CT and bone scan and they were fine.sometimes it feels like nerve pain with direct pressure. It may be there a few hours and calm down with rest, or sometimes its there all day.  Its not tender at all.When I wake up in the morning I am usually fine. I dont' think exercises will help but Dr. Mosetta Putt wanted me to come.  PERTINENT HISTORY:  Diagnosed 10/29/2020 and found by self exam. Biopsy revealed IDC ER, PR +, HER 2 -.  Pt underwent left mastectomy on 01/06/2021 with expander. 4+/5 LN. Chemotherapy completed ACP, Lymph node dissection on 07/07/21 with 1/11 lymph nodes positive. Completed radiation at end of August 2023 . Had expander exchange in Jan 2024.  PAIN:  Are you having pain? Yes NPRS scale: 0 /10 present, just tight Pain location: Left lower ribs Pain orientation: Left  PAIN TYPE: tight and achy, tight Pain description: intermittent  Aggravating factors: activity at [facility], stress Relieving factors: rest  PRECAUTIONS: Left UE lymphedema risk  RED FLAGS: None   WEIGHT BEARING RESTRICTIONS: No  FALLS:  Has patient fallen in last  6 months? No  LIVING ENVIRONMENT: Lives with: her partner Lives in: House/apartment   OCCUPATION: works  with used books  LEISURE:   HAND DOMINANCE: right   PRIOR LEVEL OF FUNCTION: Independent  PATIENT GOALS: Decrease left Rib pain   OBJECTIVE: Note: Objective measures were completed at Evaluation unless otherwise noted.  COGNITION: Overall cognitive status: Within functional limits for tasks assessed   PALPATION: Palpable shortened areas in left intercostals with some tenderness  OBSERVATIONS / OTHER ASSESSMENTS: Tightness in left lateral trunk and ribs with good morning stretch, LTR, open book stretch, slightly decreased left shoulder ROM  SENSATION: Light touch: Deficits    POSTURE: forward head, rounded shoulders  UPPER EXTREMITY AROM/PROM:  A/PROM RIGHT   eval   Shoulder extension   Shoulder flexion 158  Shoulder abduction 167  Shoulder internal rotation   Shoulder external rotation     (Blank rows = not tested)  A/PROM LEFT   eval  Shoulder extension   Shoulder flexion 152  Shoulder abduction 160 pulls lateral trunk  Shoulder internal rotation   Shoulder external rotation     (Blank rows = not tested)  CERVICAL AROM: All within normal limits:   SB arm over head to left; no pain, to right tight but no pain   UPPER EXTREMITY STRENGTH:   LYMPHEDEMA ASSESSMENTS:   SURGERY TYPE/DATE: 01/06/21 L mastectomy and SLNB  NUMBER OF LYMPH NODES REMOVED: SLNB 4/5, then ALND 07/07/2021 1+/11  CHEMOTHERAPY: completed 06/24/2021  RADIATION::Completed 10/04/21   HORMONE TREATMENT: Tamoxifen, Verzinio  INFECTIONS: NONE   LYMPHEDEMA ASSESSMENTS:   LANDMARK RIGHT  eval  At axilla    15 cm proximal to olecranon process   10 cm proximal to olecranon process   Olecranon process   15 cm proximal to ulnar styloid process   10 cm proximal to ulnar styloid process   Just proximal to ulnar styloid process   Across hand at thumb web space   At base of 2nd  digit   (Blank rows = not tested)  LANDMARK LEFT  eval  At axilla    15 cm proximal to olecranon process   10 cm proximal to olecranon process   Olecranon process   15 cm proximal to ulnar styloid process   10 cm proximal to ulnar styloid process   Just proximal to ulnar styloid process   Across hand at thumb web space   At base of 2nd digit   (Blank rows = not tested)   FUNCTIONAL TESTS:    GAIT: WNL    QUICK DASH SURVEY: NA   TODAY'S TREATMENT:                                                                                                                                          DATE  12/20/2022  Wall reaches left UE x3 Wall arc to right x  Quadruped SB stretch with arms to the right x 2 Prayer stretch to right x 3 LTR knees to right and  left x 3 T stretch left leg straight and across x 3 Standing right SB stretch arm overhead x 3 Soft tissue with cocoa butter to Left lower ribs and between ribs without tenderness, but several shortened areas noted Dynamic cupping to left rib area with traction, tilt, movement SL over blue bolster with PT stretch rotating shoulders forward and hip back 3 x 10 sec Right SL over green ball on Mat table with PT assist and pts left arm over head and right arm on table and PT assisting with stabilization prn 3 x 20-3- sec.    12/07/2022 Wall arc to right x 3 Morning stretch in standing x 3 Prayer stretch straight back and quadruped to the right x 3 Arms in T with left leg crossed over 3 x 20 sec MFR in right SL over round bolster to left ribcage and lateral trunk  added deep breathing Soft tissue with cocoa butter to Left lower ribs and between ribs without tenderness, but several shortened areas noted Dynamic cupping to left rib area with traction, tilt, movement Right SL over green ball on Mat table with PT assist and pts left arm over head and right arm on table and PT assisting with stabilization prn 3 x 20-3-  sec.   11/29/2022 Wall arc to right x 3-4 reps Standing morning stretch x 3 Quadruped walk hands to right x 3 Open book stretch x 3 LTR x 2 ea (didn't really feel stretch) MFR in right SL to left ribcage and lateral trunk, added deep breathing Soft tissue with cocoa butter to Left lower ribs and between ribs without tenderness, but several shortened areas noted Right SL over green ball on Mat table with PT assist and pts left arm over head and right arm on table and PT applying SB stretch 3 x 20-3- sec.  11/17/2022 Discussed POC and treatment interventions. Started on HEP - Supine Lower Trunk Rotation  - 2 x daily - 7 x weekly - 1sets -  3reps - Sidelying Thoracic Lumbar Rotation  - 2 x daily - 7 x weekly - 1 sets - 3 reps Good morning stretch 1 set 3 reps  PATIENT EDUCATION:  Education details: Access Code: [email or web address] URL: [email or web address] Date: 11/17/2022 Prepared by: Alvira Monday  Exercises - Supine Lower Trunk Rotation  - 2 x daily - 7 x weekly - 1sets -  reps - Sidelying Thoracic Lumbar Rotation  - 2 x daily - 7 x weekly - 1 sets - 3 reps Good morning stretch Person educated: Patient Education method: Explanation, Demonstration, and Handouts Education comprehension: verbalized understanding and returned demonstration  HOME EXERCISE PROGRAM: Gentle stretch:good morning, Lower trunk rotation, open book stretch  ASSESSMENT:  CLINICAL IMPRESSION: Pt missed over a week due to sickness. She felt good stretch with all exercises today. Cupping felt especially good and area of prior drain felt especially good to pt with cupping which felt resistance there.  OBJECTIVE IMPAIRMENTS: decreased activity tolerance, decreased knowledge of condition, decreased ROM, and impaired UE functional use.   ACTIVITY LIMITATIONS: carrying, lifting, reach over head, and reaching to the right  PARTICIPATION LIMITATIONS:  she does all that is required of her with intermittent  left rib pain  PERSONAL FACTORS: 3+ comorbidities: Left Breast Cancer s/p chemo and radiation  are also affecting patient's functional outcome.   REHAB POTENTIAL: Good  CLINICAL DECISION MAKING: Stable/uncomplicated  EVALUATION COMPLEXITY: Low  GOALS: Goals reviewed with patient? Yes  SHORT TERM GOALS=LONG TERM GOALS: Target date: 12/29/2022  Pt will be independent with a HEP for gentle stretching/strengthening Baseline: Goal status:MET 12/07/2022 2.  Pt will note decrease frequency and intensity of pain by 30-50% or greater Baseline:  Goal status: INITIAL  3.  Decreased tenderness/shortened areas in intercostals noted with manual therapy Baseline:  Goal status: INITIAL  4.  Pt will improve left shoulder flexion and scaption by 3-5 degrees Baseline:  Goal status: INITIAL   PLAN:  PT FREQUENCY: 1-2x/week  PT DURATION: 6 weeks  PLANNED INTERVENTIONS: 97146- PT Re-evaluation, 97110-Therapeutic exercises, 97112- Neuromuscular re-education, 97535- Self Care, [id number]- Manual therapy, 97760- Orthotic Fit/training, Patient/Family education, Balance training, Joint mobilization, Therapeutic exercises, Neuromuscular re-education, and Self Care, Dry Needling  PLAN FOR NEXT SESSION: Like cupping?How are stretches going/pain? Soft tissue mobilization/MFR to left ribs, review HEP,  progress to strength,  K tape?DN? Abduction ball rolls on wall  Waynette Buttery, PT 12/20/2022, 8:49 AM

## 2022-12-23 ENCOUNTER — Other Ambulatory Visit: Payer: Self-pay | Admitting: Nurse Practitioner

## 2022-12-24 ENCOUNTER — Other Ambulatory Visit: Payer: Self-pay | Admitting: Nurse Practitioner

## 2022-12-27 ENCOUNTER — Encounter: Payer: Self-pay | Admitting: Hematology

## 2022-12-29 ENCOUNTER — Ambulatory Visit: Payer: BC Managed Care – PPO

## 2023-01-03 ENCOUNTER — Ambulatory Visit: Payer: BC Managed Care – PPO

## 2023-01-03 DIAGNOSIS — R0789 Other chest pain: Secondary | ICD-10-CM

## 2023-01-03 DIAGNOSIS — C50412 Malignant neoplasm of upper-outer quadrant of left female breast: Secondary | ICD-10-CM

## 2023-01-03 NOTE — Therapy (Signed)
OUTPATIENT PHYSICAL THERAPY  UPPER EXTREMITY ONCOLOGY TREATMENT  Patient Name: Pamela Reyes MRN: [id number] DOB:1980-08-20, 42 y.o., female Today's Date: 01/03/2023  END OF SESSION:  PT End of Session - 01/03/23 0758     Visit Number 5    Number of Visits 12    Date for PT Re-Evaluation 01/03/23    PT Start Time 0800    PT Stop Time 0850    PT Time Calculation (min) 50 min    Activity Tolerance Patient tolerated treatment well    Behavior During Therapy [facility] for tasks assessed/performed             Past Medical History:  Diagnosis Date   Anxiety    Axillary pain, right 01/18/2021   Will get right axillary ultrasound.   Breast cancer (HCC)    Breast pain, left 10/06/2020   Encounter for gynecological examination with Papanicolaou smear of cervix 02/10/2021   Encounter for screening fecal occult blood testing 02/10/2021   Family history of breast cancer 11/11/2020   Fibroid 10/27/2020   Has 1 x .9 x .6 cm subserosal fibroid   H/O left mastectomy    left mastectomy 2022   History of hiatal hernia    Left breast mass 10/06/2020   +breast cancer   LLQ pain 10/06/2020   PONV (postoperative nausea and vomiting)    Port-A-Cath in place 03/25/2021   Past Surgical History:  Procedure Laterality Date   AXILLARY LYMPH NODE DISSECTION Left 07/07/2021   Procedure: LEFT AXILLARY LYMPH NODE DISSECTION;  Surgeon: Almond Lint, MD;  Location: [facility];  Service: General;  Laterality: Left;   BREAST RECONSTRUCTION WITH PLACEMENT OF TISSUE EXPANDER AND FLEX HD (ACELLULAR HYDRATED DERMIS) Left 01/06/2021   Procedure: LEFT BREAST RECONSTRUCTION WITH PLACEMENT OF TISSUE EXPANDER AND ACELLULAR  DERMIS;  Surgeon: Glenna Fellows, MD;  Location: [facility];  Service: Plastics;  Laterality: Left;   MASTECTOMY W/ SENTINEL NODE BIOPSY Left 01/06/2021   Procedure: LEFT MASTECTOMY WITH MAGTRACE;  Surgeon: Almond Lint, MD;  Location: [facility];  Service: General;  Laterality: Left;    PORT-A-CATH REMOVAL Right 07/07/2021   Procedure: REMOVAL PORT-A-CATH;  Surgeon: Almond Lint, MD;  Location: [facility];  Service: General;  Laterality: Right;   PORTACATH PLACEMENT Right 01/19/2021   Procedure: INSERTION PORT-A-CATH;  Surgeon: Almond Lint, MD;  Location: [facility];  Service: General;  Laterality: Right;   RADIOACTIVE SEED GUIDED AXILLARY SENTINEL LYMPH NODE Left 01/06/2021   Procedure: RADIOACTIVE SEED GUIDED SENTINEL LYMPH NODE BIOPSY;  Surgeon: Almond Lint, MD;  Location: [facility];  Service: General;  Laterality: Left;   REMOVAL OF TISSUE EXPANDER AND PLACEMENT OF IMPLANT Left 02/18/2022   Procedure: REMOVAL OF TISSUE EXPANDER AND PLACEMENT OF IMPLANT;  Surgeon: Glenna Fellows, MD;  Location: [facility];  Service: Plastics;  Laterality: Left;   SENTINEL NODE BIOPSY Left 01/06/2021   Procedure: LEFT AXILLARY SENTINEL LYMPH  NODE BIOPSY;  Surgeon: Almond Lint, MD;  Location: [facility];  Service: General;  Laterality: Left;   WISDOM TOOTH EXTRACTION     Patient Active Problem List   Diagnosis Date Noted   History of breast cancer 06/02/2022   Vaginal atrophy 06/02/2022   Burning with urination 10/25/2021   Urinary frequency 10/25/2021   Urgency of urination 10/25/2021   Left flank pain, chronic 09/27/2021   Paresthesia 06/08/2021   Port-A-Cath in place 03/25/2021   Encounter for screening fecal occult blood testing 02/10/2021   Encounter for gynecological  examination with Papanicolaou smear of cervix 02/10/2021   Genetic testing 11/25/2020   Family history of breast cancer 11/11/2020   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 lymph node, left (HCC) 11/11/2020   Dense breasts 11/11/2020   Malignant neoplasm of upper-outer quadrant of left breast in female, estrogen receptor positive (HCC) 11/10/2020   Fibroid 10/27/2020    PCP: Dulce Sellar, NP  REFERRING PROVIDER: Dr. Mosetta Putt  REFERRING DIAG: Left chest wall pain s/p Left mastectomy  with SLNB, ALND  THERAPY DIAG:  Malignant neoplasm of upper-outer quadrant of left breast in female, estrogen receptor positive (HCC)  Left-sided chest wall pain  ONSET DATE: 1 yr ago  Rationale for Evaluation and Treatment: Rehabilitation  SUBJECTIVE:                                                                                                                                                                                           SUBJECTIVE STATEMENT:  I have been sick again. One of my meds lowers my immune system. My ribs aren't that bad and I have been coughing a ton. I haven't exercised as much until yesterday. I went back to work yesterday and noticed it a bit, but not bad.   EVAL I have had this for over a year and it has gotten worse. I think it started before I was diagnosed but I am not sure. Its non descript. It comes and goes. I had a CT and bone scan and they were fine.sometimes it feels like nerve pain with direct pressure. It may be there a few hours and calm down with rest, or sometimes its there all day.  Its not tender at all.When I wake up in the morning I am usually fine. I dont' think exercises will help but Dr. Mosetta Putt wanted me to come.  PERTINENT HISTORY:  Diagnosed 10/29/2020 and found by self exam. Biopsy revealed IDC ER, PR +, HER 2 -.  Pt underwent left mastectomy on 01/06/2021 with expander. 4+/5 LN. Chemotherapy completed ACP, Lymph node dissection on 07/07/21 with 1/11 lymph nodes positive. Completed radiation at end of August 2023 . Had expander exchange in Jan 2024.  PAIN:  Are you having pain? Yes NPRS scale: 0 /10 present, just tight Pain location: Left lower ribs Pain orientation: Left  PAIN TYPE: tight and achy, tight Pain description: intermittent  Aggravating factors: activity at [facility], stress Relieving factors: rest  PRECAUTIONS: Left UE lymphedema risk  RED FLAGS: None   WEIGHT BEARING RESTRICTIONS: No  FALLS:  Has patient  fallen in last 6 months? No  LIVING ENVIRONMENT: Lives with: her partner Lives in: House/apartment  OCCUPATION: works with used books  LEISURE:   HAND DOMINANCE: right   PRIOR LEVEL OF FUNCTION: Independent  PATIENT GOALS: Decrease left Rib pain   OBJECTIVE: Note: Objective measures were completed at Evaluation unless otherwise noted.  COGNITION: Overall cognitive status: Within functional limits for tasks assessed   PALPATION: Palpable shortened areas in left intercostals with some tenderness  OBSERVATIONS / OTHER ASSESSMENTS: Tightness in left lateral trunk and ribs with good morning stretch, LTR, open book stretch, slightly decreased left shoulder ROM  SENSATION: Light touch: Deficits    POSTURE: forward head, rounded shoulders  UPPER EXTREMITY AROM/PROM:  A/PROM RIGHT   eval   Shoulder extension   Shoulder flexion 158  Shoulder abduction 167  Shoulder internal rotation   Shoulder external rotation     (Blank rows = not tested)  A/PROM LEFT   eval LEFT 01/03/2023  Shoulder extension    Shoulder flexion 152 162  Shoulder abduction 160 pulls lateral trunk 165  Shoulder internal rotation    Shoulder external rotation      (Blank rows = not tested)  CERVICAL AROM: All within normal limits:   SB arm over head to left; no pain, to right tight but no pain   UPPER EXTREMITY STRENGTH:   LYMPHEDEMA ASSESSMENTS:   SURGERY TYPE/DATE: 01/06/21 L mastectomy and SLNB  NUMBER OF LYMPH NODES REMOVED: SLNB 4/5, then ALND 07/07/2021 1+/11  CHEMOTHERAPY: completed 06/24/2021  RADIATION::Completed 10/04/21   HORMONE TREATMENT: Tamoxifen, Verzinio  INFECTIONS: NONE   LYMPHEDEMA ASSESSMENTS:   LANDMARK RIGHT  eval  At axilla    15 cm proximal to olecranon process   10 cm proximal to olecranon process   Olecranon process   15 cm proximal to ulnar styloid process   10 cm proximal to ulnar styloid process   Just proximal to ulnar styloid process    Across hand at thumb web space   At base of 2nd digit   (Blank rows = not tested)  LANDMARK LEFT  eval  At axilla    15 cm proximal to olecranon process   10 cm proximal to olecranon process   Olecranon process   15 cm proximal to ulnar styloid process   10 cm proximal to ulnar styloid process   Just proximal to ulnar styloid process   Across hand at thumb web space   At base of 2nd digit   (Blank rows = not tested)   FUNCTIONAL TESTS:    GAIT: WNL    QUICK DASH SURVEY: NA   TODAY'S TREATMENT:                                                                                                                                          DATE  01/03/2023  Morning stretch on wall x 3 Arc wall stretch x 3 Rotation with double red band x 5 B waist height,  D2  extension red over door x 5 ea Open book stretch x 3 B Snow angels 5 Soft tissue with cocoa butter to Left lower ribs and between ribs without tenderness Dynamic cupping to left rib area with traction, tilt, movement SL over blue bolster with PT stretch rotating shoulders forward and hip back 3 x 10  ea directon  12/20/2022  Wall reaches left UE x3 Wall arc to right x  Quadruped SB stretch with arms to the right x 2 Prayer stretch to right x 3 LTR knees to right and left x 3 T stretch left leg straight and across x 3 Standing right SB stretch arm overhead x 3 Soft tissue with cocoa butter to Left lower ribs and between ribs without tenderness, but several shortened areas noted Dynamic cupping to left rib area with traction, tilt, movement SL over blue bolster with PT stretch rotating shoulders forward and hip back 3 x 10 sec Right SL over green ball on Mat table with PT assist and pts left arm over head and right arm on table and PT assisting with stabilization prn 3 x 20-3- sec.  12/07/2022 Wall arc to right x 3 Morning stretch in standing x 3 Prayer stretch straight back and quadruped to the right x 3 Arms in  T with left leg crossed over 3 x 20 sec MFR in right SL over round bolster to left ribcage and lateral trunk  added deep breathing Soft tissue with cocoa butter to Left lower ribs and between ribs without tenderness, but several shortened areas noted Dynamic cupping to left rib area with traction, tilt, movement Right SL over green ball on Mat table with PT assist and pts left arm over head and right arm on table and PT assisting with stabilization prn 3 x 20-3- sec.   11/29/2022 Wall arc to right x 3-4 reps Standing morning stretch x 3 Quadruped walk hands to right x 3 Open book stretch x 3 LTR x 2 ea (didn't really feel stretch) MFR in right SL to left ribcage and lateral trunk, added deep breathing Soft tissue with cocoa butter to Left lower ribs and between ribs without tenderness, but several shortened areas noted Right SL over green ball on Mat table with PT assist and pts left arm over head and right arm on table and PT applying SB stretch 3 x 20-3- sec.  11/17/2022 Discussed POC and treatment interventions. Started on HEP - Supine Lower Trunk Rotation  - 2 x daily - 7 x weekly - 1sets -  3reps - Sidelying Thoracic Lumbar Rotation  - 2 x daily - 7 x weekly - 1 sets - 3 reps Good morning stretch 1 set 3 reps  PATIENT EDUCATION:  Access Code: [email or web address] URL: [email or web address] Date: 01/03/2023 Prepared by: Alvira Monday  Exercises - Standing Diagonal Chop  - 1 x daily - 7 x weekly - 1 sets - 5-10 reps - Anti-Rotation Sidestepping with Resistance  - 1 x daily - 7 x weekly - 1 sets - 5-10 reps Education details: Access Code: [email or web address] URL: [email or web address] Date: 11/17/2022 Prepared by: Alvira Monday  Exercises - Supine Lower Trunk Rotation  - 2 x daily - 7 x weekly - 1sets -  reps - Sidelying Thoracic Lumbar Rotation  - 2 x daily - 7 x weekly - 1 sets - 3 reps Good morning stretch Person educated: Patient Education method: Explanation,  Demonstration, and Handouts Education comprehension: verbalized understanding and returned demonstration  HOME EXERCISE PROGRAM: Gentle stretch:good morning, Lower  trunk rotation, open book stretch  ASSESSMENT:  CLINICAL IMPRESSION:  Pt has achieved all goals established. She is pleased with her progress and feels ready to be discharged. She will contact us with any questions or concerns  OBJECTIVE IMPAIRMENTS: decreased activity tolerance, decreased knowledge of condition, decreased ROM, and impaired UE functional use.   ACTIVITY LIMITATIONS: carrying, lifting, reach over head, and reaching to the right  PARTICIPATION LIMITATIONS:  she does all that is required of her with intermittent left rib pain  PERSONAL FACTORS: 3+ comorbidities: Left Breast Cancer s/p chemo and radiation  are also affecting patient's functional outcome.   REHAB POTENTIAL: Good  CLINICAL DECISION MAKING: Stable/uncomplicated  EVALUATION COMPLEXITY: Low  GOALS: Goals reviewed with patient? Yes  SHORT TERM GOALS=LONG TERM GOALS: Target date: 12/29/2022  Pt will be independent with a HEP for gentle stretching/strengthening Baseline: Goal status:MET 12/07/2022 2.  Pt will note decrease frequency and intensity of pain by 30-50% or greater Baseline:  Goal status:MET 01/03/2023  50% 3.  Decreased tenderness/shortened areas in intercostals noted with manual therapy Baseline:  Goal status: MET  01/03/2023 4.  Pt will improve left shoulder flexion and scaption by 3-5 degrees Baseline:  Goal status: MET  01/03/2023  PLAN:  PT FREQUENCY: 1-2x/week  PT DURATION: 6 weeks  PLANNED INTERVENTIONS: 97146- PT Re-evaluation, 97110-Therapeutic exercises, 97112- Neuromuscular re-education, 97535- Self Care, [id number]- Manual therapy, [phone]- Orthotic Fit/training, Patient/Family education, Balance training, Joint mobilization, Therapeutic exercises, Neuromuscular re-education, and Self Care, Dry Needling  PLAN FOR  NEXT SESSION: Pt is discharged to continue independent self management. She has achieved all goals PHYSICAL THERAPY DISCHARGE SUMMARY  Visits from Start of Care: 5  Current functional level related to goals / functional outcomes: Achieved all goals   Remaining deficits: Improved 50% overall, still gets some intermittent discomfort at left ribs   Education / Equipment: HEP   Patient agrees to discharge. Patient goals were met. Patient is being discharged due to meeting the stated rehab goals.   Waynette Buttery, PT 01/03/2023, 8:53 AM

## 2023-02-07 ENCOUNTER — Telehealth: Payer: Self-pay | Admitting: Hematology

## 2023-02-15 ENCOUNTER — Other Ambulatory Visit: Payer: BC Managed Care – PPO

## 2023-02-15 ENCOUNTER — Ambulatory Visit: Payer: BC Managed Care – PPO | Admitting: Hematology

## 2023-02-15 NOTE — Assessment & Plan Note (Signed)
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 Biopsy on 11/05/20 confirmed invasive ductal carcinoma, with metastatic involvement of one lymph node. -staging CT CAP and bone scan on 11/19/20 showed no distant metastasis -she underwent left mastectomy on 01/06/21 under Dr. Donell Beers. Pathology showed 6.5 cm invasive ductal carcinoma, grade 2, involving dermis of nipple without epidermal involvement. Margins were negative, but all 4 lymph nodes showed metastatic disease. -s/p 4 cycles AC 02/11/21 - 03/25/21 and Abraxane 04/08/21 - 06/24/21. -PET scan on 06/16/21 showed no convincing evidence of local recurrence or metastatic disease. -left ALND on 07/07/21 with Dr. Donell Beers, path showed 1/11 positive nodes -s/p postmastectomy radiation by Dr. Mitzi Hansen 08/16/21 - 10/04/21. -she started Zoladex in 07/07/21 and letrozole on 07/21/21. She developed worsening joint pain and was switched to exemestane in early 10/2021. But due to worsening joint pain, urinary sx, and negative QOL we switched to Tamoxifen (and stopped zoladex) in 04/2022  -she started Verzenio 100mg  BID on 10/28/21, dose increased to 150mg  bid later. She had UTI, fatigue etc, but overall tolerating better now -she had reconsult surgery on February 18, 2022, Verzenio was held before surgery.

## 2023-02-16 ENCOUNTER — Encounter: Payer: Self-pay | Admitting: Hematology

## 2023-02-16 ENCOUNTER — Inpatient Hospital Stay (HOSPITAL_BASED_OUTPATIENT_CLINIC_OR_DEPARTMENT_OTHER): Payer: BC Managed Care – PPO | Admitting: Hematology

## 2023-02-16 ENCOUNTER — Inpatient Hospital Stay: Payer: BC Managed Care – PPO | Attending: Hematology

## 2023-02-16 VITALS — BP 116/74 | HR 98 | Temp 98.0°F | Resp 16 | Wt 121.0 lb

## 2023-02-16 DIAGNOSIS — Z923 Personal history of irradiation: Secondary | ICD-10-CM | POA: Diagnosis not present

## 2023-02-16 DIAGNOSIS — Z1732 Human epidermal growth factor receptor 2 negative status: Secondary | ICD-10-CM | POA: Diagnosis not present

## 2023-02-16 DIAGNOSIS — Z803 Family history of malignant neoplasm of breast: Secondary | ICD-10-CM | POA: Insufficient documentation

## 2023-02-16 DIAGNOSIS — Z170 Estrogen receptor positive status [ER+]: Secondary | ICD-10-CM

## 2023-02-16 DIAGNOSIS — Z9012 Acquired absence of left breast and nipple: Secondary | ICD-10-CM | POA: Insufficient documentation

## 2023-02-16 DIAGNOSIS — C50412 Malignant neoplasm of upper-outer quadrant of left female breast: Secondary | ICD-10-CM | POA: Diagnosis present

## 2023-02-16 DIAGNOSIS — Z79810 Long term (current) use of selective estrogen receptor modulators (SERMs): Secondary | ICD-10-CM | POA: Insufficient documentation

## 2023-02-16 DIAGNOSIS — Z9221 Personal history of antineoplastic chemotherapy: Secondary | ICD-10-CM | POA: Insufficient documentation

## 2023-02-16 DIAGNOSIS — Z1721 Progesterone receptor positive status: Secondary | ICD-10-CM | POA: Diagnosis not present

## 2023-02-16 LAB — CMP (CANCER CENTER ONLY)
ALT: 13 U/L (ref 0–44)
AST: 19 U/L (ref 15–41)
Albumin: 4.6 g/dL (ref 3.5–5.0)
Alkaline Phosphatase: 35 U/L — ABNORMAL LOW (ref 38–126)
Anion gap: 6 (ref 5–15)
BUN: 13 mg/dL (ref 6–20)
CO2: 27 mmol/L (ref 22–32)
Calcium: 10.1 mg/dL (ref 8.9–10.3)
Chloride: 105 mmol/L (ref 98–111)
Creatinine: 0.83 mg/dL (ref 0.44–1.00)
GFR, Estimated: >60 mL/min (ref >60)
Glucose, Bld: 89 mg/dL (ref 70–99)
Potassium: 4.7 mmol/L (ref 3.5–5.1)
Sodium: 138 mmol/L (ref 135–145)
Total Bilirubin: 0.4 mg/dL (ref 0.0–1.2)
Total Protein: 7.6 g/dL (ref 6.5–8.1)

## 2023-02-16 LAB — CBC WITH DIFFERENTIAL (CANCER CENTER ONLY)
Abs Immature Granulocytes: 0.01 10*3/uL (ref 0.00–0.07)
Basophils Absolute: 0 10*3/uL (ref 0.0–0.1)
Basophils Relative: 1 %
Eosinophils Absolute: 0 10*3/uL (ref 0.0–0.5)
Eosinophils Relative: 1 %
HCT: 38.3 % (ref 36.0–46.0)
Hemoglobin: 13.4 g/dL (ref 12.0–15.0)
Immature Granulocytes: 0 %
Lymphocytes Relative: 45 %
Lymphs Abs: 1.4 10*3/uL (ref 0.7–4.0)
MCH: 32.7 pg (ref 26.0–34.0)
MCHC: 35 g/dL (ref 30.0–36.0)
MCV: 93.4 fL (ref 80.0–100.0)
Monocytes Absolute: 0.2 10*3/uL (ref 0.1–1.0)
Monocytes Relative: 6 %
Neutro Abs: 1.5 10*3/uL — ABNORMAL LOW (ref 1.7–7.7)
Neutrophils Relative %: 47 %
Platelet Count: 234 10*3/uL (ref 150–400)
RBC: 4.1 MIL/uL (ref 3.87–5.11)
RDW: 13.2 % (ref 11.5–15.5)
WBC Count: 3.2 10*3/uL — ABNORMAL LOW (ref 4.0–10.5)
nRBC: 0 % (ref 0.0–0.2)

## 2023-02-16 NOTE — Progress Notes (Signed)
 [facility]   [phone] [phone]   Clinic Follow up Note   Patient Care Team: Lucius Krabbe, NP as PCP - General (Family Medicine) Glean Stephane BROCKS, RN as Oncology Nurse Navigator Tyree Nanetta SAILOR, RN as Oncology Nurse Navigator Aron Shoulders, MD as Consulting Physician (General Surgery) Lanny Callander, MD as Consulting Physician (Hematology) Dewey Rush, MD as Consulting Physician (Radiation Oncology) Signa Delon LABOR, NP as Nurse Practitioner (Obstetrics and Gynecology) Burton, Lacie K, NP as Nurse Practitioner (Nurse Practitioner)  Date of Service:  02/16/2023  CHIEF COMPLAINT: f/u of left breast cancer  CURRENT THERAPY:  Adjuvant tamoxifen  and Verzenio   Oncology History   Malignant neoplasm of upper-outer quadrant of left breast in female, 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 Biopsy on 11/05/20 confirmed invasive ductal carcinoma, with metastatic involvement of one lymph node. -staging CT CAP and bone scan on 11/19/20 showed no distant metastasis -she underwent left mastectomy on 01/06/21 under Dr. Aron. Pathology showed 6.5 cm invasive ductal carcinoma, grade 2, involving dermis of nipple without epidermal involvement. Margins were negative, but all 4 lymph nodes showed metastatic disease. -s/p 4 cycles AC 02/11/21 - 03/25/21 and Abraxane  04/08/21 - 06/24/21. -PET scan on 06/16/21 showed no convincing evidence of local recurrence or metastatic disease. -left ALND on 07/07/21 with Dr. Aron, path showed 1/11 positive nodes -s/p postmastectomy radiation by Dr. Dewey 08/16/21 - 10/04/21. -she started Zoladex  in 07/07/21 and letrozole  on 07/21/21. She developed worsening joint pain and was switched to exemestane  in early 10/2021. But due to worsening joint pain, urinary sx, and negative QOL we  switched to Tamoxifen  (and stopped zoladex ) in 04/2022  -she started Verzenio  100mg  BID on 10/28/21, dose increased to 150mg  bid later. She had UTI, fatigue etc, but overall tolerating better now -she had reconsult surgery on February 18, 2022, Verzenio  was held before surgery.   Assessment and Plan    Breast Cancer 43 year old with breast cancer on anti-estrogen therapy for over a year. Reports manageable diarrhea, occasional hot flashes, and resolved urinary issues. Amenorrhea post-chemotherapy and Zoladex . Minimal use of estradiol  cream for quality of life, aware of risk-benefit. Last mammogram overdue since February 09, 2022. Slightly low blood count due to Verzenio , other counts normal. Tumor marker and Signatera test results pending. Discussed alternating mammogram and MRI every six months due to dense breast tissue and family history of breast cancer. Prefers this approach for thorough monitoring. - Order screening mammogram - Schedule MRI six months post-mammogram - Follow up on Signatera test results - Schedule follow-up visit in three months.         SUMMARY OF ONCOLOGIC HISTORY: Oncology History Overview Note   Cancer Staging  Malignant neoplasm of upper-outer quadrant of left breast in female, estrogen receptor positive (HCC) Staging form: Breast, AJCC 8th Edition - Clinical stage from 11/05/2020: Stage IIA (cT2, cN1, cM0, G1, ER+, PR+, HER2: Equivocal) - Signed by Lanny Callander, MD on 11/10/2020 - Pathologic stage from 01/06/2021: Stage IB (pT3, pN2a, cM0, G2, ER+, PR+, HER2-) - Signed by Lanny Callander, MD on 01/12/2021     Malignant neoplasm of upper-outer quadrant of left breast in female, estrogen receptor positive (HCC)  10/28/2020 Mammogram   Bilateral Diagnostic Mammogram; Left Breast Ultrasound  IMPRESSION: 1. Suspicious palpable mass/hypoechoic area in the left breast at 12 o'clock retroareolar measuring 4.5 cm.   2.  Suspicious lymph  node in the left axilla.   11/05/2020  Cancer Staging   Staging form: Breast, AJCC 8th Edition - Clinical stage from 11/05/2020: Stage IIA (cT2, cN1, cM0, G1, ER+, PR+, HER2: Equivocal) - Signed by Lanny Callander, MD on 11/10/2020 Stage prefix: Initial diagnosis Histologic grading system: 3 grade system   11/05/2020 Pathology Results   Diagnosis 1. Breast, left, needle core biopsy, 12 o'clock subareolar, ribbon clip - INVASIVE MAMMARY CARCINOMA. SEE NOTE 1. Carcinoma measures 1.5 cm in greatest linear dimension and appears grade 2. 1. Immunohistochemical stain for E-cadherin is positive in the tumor cells, consistent with a ductal phenotype. 1. PROGNOSTIC INDICATORS Results: The tumor cells are EQUIVOCAL for Her2 (2+). Her2 by FISH will be performed and results reported separately. Estrogen Receptor: 70%, POSITIVE, STRONG-MODERATE STAINING INTENSITY Progesterone Receptor: 80%, POSITIVE, MODERATE STAINING INTENSITY Proliferation Marker Ki67: 1%  2. Lymph node, needle/core biopsy, left axilla, tribell clip - METASTATIC CARCINOMA TO A LYMPH NODE. SEE NOTE 2. Largest contiguous focus of metastatic carcinoma measures 0.3 cm. 2. PROGNOSTIC INDICATORS Results: The tumor cells are EQUIVOCAL for Her2 (2+). Her2 by FISH will be performed and results reported separately. Estrogen Receptor: 90%, POSITIVE, STRONG STAINING INTENSITY Progesterone Receptor: 95%, POSITIVE, STRONG STAINING INTENSITY Proliferation Marker Ki67: 1%   11/10/2020 Initial Diagnosis   Malignant neoplasm of upper-outer quadrant of left breast in female, estrogen receptor positive (HCC)   11/19/2020 Imaging   EXAM: CT CHEST, ABDOMEN, AND PELVIS WITH CONTRAST  IMPRESSION: 1. Left breast mass  as previously described. 2. Enhancing subcentimeter left axillary lymph nodes. 3. No evidence of distal metastatic disease. 4. Small hiatal hernia   11/19/2020 Imaging   EXAM: NUCLEAR MEDICINE WHOLE BODY BONE SCAN  IMPRESSION: No evidence of metastatic disease.    11/21/2020 Imaging   EXAM: BILATERAL BREAST MRI WITH AND WITHOUT CONTRAST  IMPRESSION: 1. Suspicious 1.3 centimeter mass in the anterior UPPER central RIGHT breast warranting tissue diagnosis. (Image 65 of series 7). 2. 4 millimeter possible satellite nodule posterior to the 1.3 centimeter mass in the RIGHT breast. (Image 63 of series 7). 3. Indeterminate oval mass in the UPPER central middle depth of the RIGHT breast warranting tissue diagnosis. (Image 60 of series 7). 4. Large area of enhancement in the central portion of the LEFT breast, involving all quadrants. There is associated enhancement and retraction of the LEFT nipple, with significantly smaller size of the LEFT breast. Enhancement spans at least 6.5 centimeters. 5. Enlarged, previously biopsied LEFT axillary lymph node.   11/24/2020 Genetic Testing   Negative hereditary cancer genetic testing: no pathogenic variants detected in [employer] BRCAPlus STAT Panel or [employer] CustomNext-Cancer +RNAinsight Panel.  The report dates are November 24, 2020 and November 27, 2020, respectively.   The BRCAplus panel offered by [employer] and includes sequencing and deletion/duplication analysis for the following 8 genes: ATM, BRCA1, BRCA2, CDH1, CHEK2, PALB2, PTEN, and TP53.  The CustomNext-Cancer+RNAinsight panel offered by [employer] includes sequencing and rearrangement analysis for the following 47 genes:  APC, ATM, AXIN2, BARD1, BMPR1A, BRCA1, BRCA2, BRIP1, CDH1, CDK4, CDKN2A, CHEK2, DICER1, EPCAM, GREM1, HOXB13, MEN1, MLH1, MSH2, MSH3, MSH6, MUTYH, NBN, NF1, NF2, NTHL1, PALB2, PMS2, POLD1, POLE, PTEN, RAD51C, RAD51D, RECQL, RET, SDHA, SDHAF2, SDHB, SDHC, SDHD, SMAD4, SMARCA4, STK11, TP53, TSC1, TSC2, and VHL.  RNA data is routinely analyzed for use in variant interpretation for all genes.    11/27/2020 Pathology Results   Diagnosis Breast, right, needle core biopsy, anterior upper central - FIBROADENOMATOID AND FIBROCYSTIC CHANGES WITH  CALCIFICATIONS -  PSEUDOANGIOMATOUS STROMAL HYPERPLASIA - FOCAL PERIDUCTULAR CHRONIC INFLAMMATION - NO MALIGNANCY IDENTIFIED   01/06/2021 Cancer Staging   Staging form: Breast, AJCC 8th Edition - Pathologic stage from 01/06/2021: Stage IB (pT3, pN2a, cM0, G2, ER+, PR+, HER2-) - Signed by Lanny Callander, MD on 01/12/2021 Stage prefix: Initial diagnosis Histologic grading system: 3 grade system Residual tumor (R): R0 - None   01/06/2021 Definitive Surgery   FINAL MICROSCOPIC DIAGNOSIS:   A. LYMPH NODE, LEFT AXILLARY, SENTINEL, EXCISION:  - Metastatic carcinoma to a lymph node (1/1)  - Focus of metastatic carcinoma measures 1.5 cm without evidence of extranodal extension   B. 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 BREAST, LEFT INFERIOLATERAL MARGIN, EXCISION:  - Benign fibroadipose tissue  - Negative for carcinoma   D. LYMPH NODE, LEFT AXILLARY #1, SENTINEL, EXCISION:  - Metastatic carcinoma to a lymph node (1/1)  - Focus of metastatic carcinoma measures 1.4 cm without evidence of extranodal extension   E. LYMPH NODE, LEFT AXILLARY #2, SENTINEL, EXCISION:  - Metastatic carcinoma to a lymph node (1/1)  - Focus of metastatic carcinoma measures 0.5 cm without evidence of extranodal extension   F. LYMPH NODE, LEFT AXILLARY #3, SENTINEL, EXCISION:  - Metastatic carcinoma to a lymph node (1/1)  - Focus of metastatic carcinoma measures 0.9 cm without evidence of extranodal extension   G. LYMPH NODE, LEFT AXILLARY #4, SENTINEL, EXCISION:  - Benign fibroadipose tissue, negative for carcinoma  - Lymphoid tissue is not identified    02/11/2021 - 06/24/2021 Chemotherapy   Patient is on Treatment Plan : BREAST ADJUVANT DOSE DENSE AC q14d / PACLitaxel  q7d     06/16/2021 PET scan   IMPRESSION: 1.  Postsurgical change of left mastectomy and left axillary lymph node dissection with tissue expander in place. Mild hypermetabolic activity about the surgical site without suspicious focal hypermetabolic soft tissue nodularity, favored to reflect postsurgical change.   2. No convincing scintigraphic evidence of hypermetabolic local recurrence or metastatic disease within the neck, chest, abdomen or pelvis.      Discussed the use of AI scribe software for clinical note transcription with the patient, who gave verbal consent to proceed.  History of Present Illness   The patient, a 43 year old female with a history of breast cancer, presents for a routine follow-up. She reports tolerating her current medication regimen well, with side effects including daily diarrhea and hot flashes, both of which she describes as manageable. She uses Imodium  as needed for diarrhea. The frequency and intensity of her hot flashes have improved over time. She also mentions a history of urinary issues, which have resolved. The patient uses estradiol  cream as needed and finds it beneficial for her quality of life. She is due for a mammogram, which the doctor orders during the visit. The patient has not had a period since undergoing chemotherapy and was previously on Zoladex . She also mentions a blood test called Signatera that was ordered a few months ago, but she has not received the results yet.         All other systems were reviewed with the patient and are negative.  MEDICAL HISTORY:  Past Medical History:  Diagnosis Date   Anxiety    Axillary pain, right 01/18/2021   Will get right axillary ultrasound.  Breast cancer (HCC)    Breast pain, left 10/06/2020   Encounter for gynecological examination with Papanicolaou smear of cervix 02/10/2021   Encounter for screening fecal occult blood testing 02/10/2021   Family history of breast cancer 11/11/2020   Fibroid 10/27/2020   Has 1 x .9 x .6 cm subserosal  fibroid   H/O left mastectomy    left mastectomy 2022   History of hiatal hernia    Left breast mass 10/06/2020   +breast cancer   LLQ pain 10/06/2020   PONV (postoperative nausea and vomiting)    Port-A-Cath in place 03/25/2021    SURGICAL HISTORY: Past Surgical History:  Procedure Laterality Date   AXILLARY LYMPH NODE DISSECTION Left 07/07/2021   Procedure: LEFT AXILLARY LYMPH NODE DISSECTION;  Surgeon: Aron Shoulders, MD;  Location: [facility];  Service: General;  Laterality: Left;   BREAST RECONSTRUCTION WITH PLACEMENT OF TISSUE EXPANDER AND FLEX HD (ACELLULAR HYDRATED DERMIS) Left 01/06/2021   Procedure: LEFT BREAST RECONSTRUCTION WITH PLACEMENT OF TISSUE EXPANDER AND ACELLULAR  DERMIS;  Surgeon: Arelia Filippo, MD;  Location: [facility];  Service: Plastics;  Laterality: Left;   MASTECTOMY W/ SENTINEL NODE BIOPSY Left 01/06/2021   Procedure: LEFT MASTECTOMY WITH MAGTRACE;  Surgeon: Aron Shoulders, MD;  Location: [facility];  Service: General;  Laterality: Left;   PORT-A-CATH REMOVAL Right 07/07/2021   Procedure: REMOVAL PORT-A-CATH;  Surgeon: Aron Shoulders, MD;  Location: [facility];  Service: General;  Laterality: Right;   PORTACATH PLACEMENT Right 01/19/2021   Procedure: INSERTION PORT-A-CATH;  Surgeon: Aron Shoulders, MD;  Location: [facility];  Service: General;  Laterality: Right;   RADIOACTIVE SEED GUIDED AXILLARY SENTINEL LYMPH NODE Left 01/06/2021   Procedure: RADIOACTIVE SEED GUIDED SENTINEL LYMPH NODE BIOPSY;  Surgeon: Aron Shoulders, MD;  Location: [facility];  Service: General;  Laterality: Left;   REMOVAL OF TISSUE EXPANDER AND PLACEMENT OF IMPLANT Left 02/18/2022   Procedure: REMOVAL OF TISSUE EXPANDER AND PLACEMENT OF IMPLANT;  Surgeon: Arelia Filippo, MD;  Location: [facility];  Service: Plastics;  Laterality: Left;   SENTINEL NODE BIOPSY Left 01/06/2021   Procedure: LEFT AXILLARY SENTINEL LYMPH  NODE BIOPSY;  Surgeon: Aron Shoulders, MD;   Location: [facility];  Service: General;  Laterality: Left;   WISDOM TOOTH EXTRACTION      I have reviewed the social history and family history with the patient and they are unchanged from previous note.  ALLERGIES:  is allergic to prochlorperazine .  MEDICATIONS:  Current Outpatient Medications  Medication Sig Dispense Refill   abemaciclib  (VERZENIO ) 150 MG tablet Take 1 tablet (150 mg total) by mouth 2 (two) times daily. 56 tablet 3   acetaminophen  (TYLENOL ) 500 MG tablet Take 500-1,000 mg by mouth every 6 (six) hours as needed for moderate pain or headache.     Biotin 1 MG CAPS Take by mouth.     Cholecalciferol (DIALYVITE VITAMIN D 5000) 125 MCG (5000 UT) capsule Take 5,000 Units by mouth daily. Vit D and K supplement combined     diazepam  (VALIUM ) 5 MG tablet Place 1 tablet vaginally nightly as needed for muscle spasm/ pelvic pain. 30 tablet 0   estradiol  (ESTRACE ) 0.1 MG/GM vaginal cream PLACE 0.5G NIGHTLY FOR TWO WEEKS THEN TWICE A WEEK AFTER 126 g 4   gabapentin  (NEURONTIN ) 100 MG capsule Take 1 capsule (100 mg total) by mouth 3 (three) times daily. 90 capsule 2   hydrOXYzine  (ATARAX ) 10 MG tablet TAKE 1 TABLET (10 MG TOTAL) BY  MOUTH DAILY AS NEEDED FOR ANXIETY, MAY TAKE 2 TIMES A DAY IF NEEDED 180 tablet 1   loratadine (CLARITIN) 10 MG tablet Take 10 mg by mouth daily.     omeprazole  (PRILOSEC) 20 MG capsule TAKE 1 CAPSULE BY MOUTH EVERY DAY 90 capsule 1   tamoxifen  (NOLVADEX ) 20 MG tablet TAKE 1 TABLET BY MOUTH EVERY DAY 90 tablet 1   tretinoin  (RETIN-A ) 0.025 % cream Apply 1 application. topically at bedtime.     No current facility-administered medications for this visit.    PHYSICAL EXAMINATION: ECOG PERFORMANCE STATUS: 0 - Asymptomatic  Vitals:   02/16/23 1116  BP: 116/74  Pulse: 98  Resp: 16  Temp: 98 F (36.7 C)  SpO2: 100%   Wt Readings from Last 3 Encounters:  02/16/23 121 lb (54.9 kg)  11/16/22 123 lb 4.8 oz (55.9 kg)  10/31/22 121 lb (54.9 kg)      GENERAL:alert, no distress and comfortable SKIN: skin color, texture, turgor are normal, no rashes or significant lesions EYES: normal, Conjunctiva are pink and non-injected, sclera clear NECK: supple, thyroid normal size, non-tender, without nodularity LYMPH:  no palpable lymphadenopathy in the cervical, axillary  LUNGS: clear to auscultation and percussion with normal breathing effort HEART: regular rate & rhythm and no murmurs and no lower extremity edema ABDOMEN:abdomen soft, non-tender and normal bowel sounds Musculoskeletal:no cyanosis of digits and no clubbing  NEURO: alert & oriented x 3 with fluent speech, no focal motor/sensory deficits BREAST: Right breast soft no palpable mass. Left breast with implant, no knots or tenderness.  No axillary adenopathy.     LABORATORY DATA:  I have reviewed the data as listed    Latest Ref Rng & Units 02/16/2023   10:48 AM 11/16/2022    8:17 AM 08/17/2022    8:52 AM  CBC  WBC 4.0 - 10.5 K/uL 3.2  2.7  2.8   Hemoglobin 12.0 - 15.0 g/dL 86.5  86.3  86.2   Hematocrit 36.0 - 46.0 % 38.3  40.3  40.5   Platelets 150 - 400 K/uL 234  186  176         Latest Ref Rng & Units 02/16/2023   10:48 AM 11/16/2022    8:17 AM 08/17/2022    8:52 AM  CMP  Glucose 70 - 99 mg/dL 89  93  80   BUN 6 - 20 mg/dL 13  16  17    Creatinine 0.44 - 1.00 mg/dL 9.16  9.08  9.09   Sodium 135 - 145 mmol/L 138  141  141   Potassium 3.5 - 5.1 mmol/L 4.7  4.6  4.6   Chloride 98 - 111 mmol/L 105  105  105   CO2 22 - 32 mmol/L 27  28  29    Calcium  8.9 - 10.3 mg/dL 89.8  89.9  89.7   Total Protein 6.5 - 8.1 g/dL 7.6  7.2  7.5   Total Bilirubin 0.0 - 1.2 mg/dL 0.4  0.5  0.5   Alkaline Phos 38 - 126 U/L 35  37  43   AST 15 - 41 U/L 19  17  16    ALT 0 - 44 U/L 13  12  12        RADIOGRAPHIC STUDIES: I have personally reviewed the radiological images as listed and agreed with the findings in the report. No results found.    No orders of the defined types were placed  in this encounter.  All questions were answered. The patient  knows to call the clinic with any problems, questions or concerns. No barriers to learning was detected. The total time spent in the appointment was 25 minutes.     Onita Mattock, MD 02/16/2023

## 2023-02-17 LAB — CANCER ANTIGEN 27.29: CA 27.29: 28.2 U/mL (ref 0.0–38.6)

## 2023-02-20 ENCOUNTER — Other Ambulatory Visit: Payer: Self-pay

## 2023-02-21 ENCOUNTER — Other Ambulatory Visit: Payer: Self-pay | Admitting: Hematology

## 2023-02-21 DIAGNOSIS — Z1231 Encounter for screening mammogram for malignant neoplasm of breast: Secondary | ICD-10-CM

## 2023-03-05 LAB — SIGNATERA
SIGNATERA MTM READOUT: 0 MTM/ml
SIGNATERA TEST RESULT: NEGATIVE

## 2023-03-05 LAB — SIGNATERA ONLY (NATERA MANAGED)
SIGNATERA MTM READOUT: 0 MTM/ml
SIGNATERA TEST RESULT: NEGATIVE

## 2023-03-08 ENCOUNTER — Ambulatory Visit
Admission: RE | Admit: 2023-03-08 | Discharge: 2023-03-08 | Disposition: A | Discharge status: Home / Self Care | Payer: BC Managed Care – PPO | Source: Ambulatory Visit | Attending: Hematology | Admitting: Hematology

## 2023-03-08 DIAGNOSIS — Z1231 Encounter for screening mammogram for malignant neoplasm of breast: Secondary | ICD-10-CM

## 2023-03-20 ENCOUNTER — Encounter: Payer: Self-pay | Admitting: Nurse Practitioner

## 2023-04-05 ENCOUNTER — Other Ambulatory Visit: Payer: Self-pay

## 2023-04-05 DIAGNOSIS — C50412 Malignant neoplasm of upper-outer quadrant of left female breast: Secondary | ICD-10-CM

## 2023-04-11 ENCOUNTER — Other Ambulatory Visit: Payer: Self-pay | Admitting: Hematology

## 2023-04-11 ENCOUNTER — Inpatient Hospital Stay: Payer: BLUE CROSS/BLUE SHIELD | Attending: Hematology

## 2023-04-11 DIAGNOSIS — C50412 Malignant neoplasm of upper-outer quadrant of left female breast: Secondary | ICD-10-CM

## 2023-04-11 LAB — GENETIC SCREENING ORDER

## 2023-04-12 ENCOUNTER — Encounter: Payer: Self-pay | Admitting: Family

## 2023-04-12 ENCOUNTER — Ambulatory Visit: Payer: BLUE CROSS/BLUE SHIELD | Admitting: Family

## 2023-04-12 VITALS — BP 110/64 | HR 89 | Temp 97.8°F | Ht 64.0 in | Wt 123.8 lb

## 2023-04-12 DIAGNOSIS — K449 Diaphragmatic hernia without obstruction or gangrene: Secondary | ICD-10-CM | POA: Diagnosis not present

## 2023-04-12 NOTE — Progress Notes (Signed)
 Patient ID: Pamela Reyes, female    DOB: 05-12-1980, 43 y.o.   MRN: [id number]  Chief Complaint  Patient presents with   Abdominal Pain    Pt states the last month has gotten worse with abdominal pain left side more. States she has a hernia but never been taking care of the dr found it on scan long time ago when she had breast cancer.       Discussed the use of AI scribe software for clinical note transcription with the patient, who gave verbal consent to proceed.  History of Present Illness   Pamela Reyes is a 43 year old female with breast cancer who presents with gastrointestinal symptoms and concerns about a hiatal hernia. She has been experiencing ongoing gastrointestinal symptoms, which she suspects may be related to her hiatal hernia. A hiatal hernia was diagnosed in October 2022 during staging scans for breast cancer, but it was not addressed at that time. She describes pressure when eating and pain that feels intestinal, located in the upper abdomen and sometimes radiating to the back, particularly around the scapula. These symptoms have been persistent for a while. No reflux or indigestion symptoms were present at the time of the initial hiatal hernia diagnosis, but she now reports more stomach issues. She takes Prilosec 20mg  daily, initially started for nausea associated with medication SE, and it has helped with nausea, and she continues to take it once daily. She has not seen a gastroenterologist yet for these symptoms.     Assessment & Plan:     Hiatal Hernia - Patient reports pressure when eating and upper abdominal pain radiating to the back. History of small hiatal hernia noted on imaging in 2022. Currently on Omeprazole for nausea related to Zetia. -Increase Omeprazole to twice daily or 2 caps qd for 1-2 weeks to assess symptom improvement. -Refer to Gastroenterology for further evaluation and management. -Advise patient to avoid acid-inducing foods (tomatoes, citrus  fruits, caffeine, chocolate). -Will continue to monitor.     Subjective:    Outpatient Medications Prior to Visit  Medication Sig Dispense Refill   acetaminophen (TYLENOL) 500 MG tablet Take 500-1,000 mg by mouth every 6 (six) hours as needed for moderate pain or headache.     Biotin 1 MG CAPS Take by mouth.     Cholecalciferol (DIALYVITE VITAMIN D 5000) 125 MCG (5000 UT) capsule Take 5,000 Units by mouth daily. Vit D and K supplement combined     diazepam (VALIUM) 5 MG tablet Place 1 tablet vaginally nightly as needed for muscle spasm/ pelvic pain. 30 tablet 0   estradiol (ESTRACE) 0.1 MG/GM vaginal cream PLACE 0.5G NIGHTLY FOR TWO WEEKS THEN TWICE A WEEK AFTER 126 g 4   gabapentin (NEURONTIN) 100 MG capsule Take 1 capsule (100 mg total) by mouth 3 (three) times daily. 90 capsule 2   loratadine (CLARITIN) 10 MG tablet Take 10 mg by mouth daily.     omeprazole (PRILOSEC) 20 MG capsule TAKE 1 CAPSULE BY MOUTH EVERY DAY 90 capsule 1   tamoxifen (NOLVADEX) 20 MG tablet TAKE 1 TABLET BY MOUTH EVERY DAY 90 tablet 1   VERZENIO 150 MG tablet TAKE 1 TABLET BY MOUTH 2 TIMES A DAY 14 tablet 3   hydrOXYzine (ATARAX) 10 MG tablet TAKE 1 TABLET (10 MG TOTAL) BY MOUTH DAILY AS NEEDED FOR ANXIETY, MAY TAKE 2 TIMES A DAY IF NEEDED (Patient not taking: Reported on 04/12/2023) 180 tablet 1   tretinoin (RETIN-A) 0.025 %  cream Apply 1 application. topically at bedtime. (Patient not taking: Reported on 04/12/2023)     No facility-administered medications prior to visit.   Past Medical History:  Diagnosis Date   Anxiety    Axillary pain, right 01/18/2021   Will get right axillary ultrasound.   Breast cancer (HCC)    Breast pain, left 10/06/2020   Encounter for gynecological examination with Papanicolaou smear of cervix 02/10/2021   Encounter for screening fecal occult blood testing 02/10/2021   Family history of breast cancer 11/11/2020   Fibroid 10/27/2020   Has 1 x .9 x .6 cm subserosal fibroid   H/O left  mastectomy    left mastectomy 2022   History of hiatal hernia    Left breast mass 10/06/2020   +breast cancer   LLQ pain 10/06/2020   PONV (postoperative nausea and vomiting)    Port-A-Cath in place 03/25/2021   Past Surgical History:  Procedure Laterality Date   AXILLARY LYMPH NODE DISSECTION Left 07/07/2021   Procedure: LEFT AXILLARY LYMPH NODE DISSECTION;  Surgeon: Almond Lint, MD;  Location: [facility];  Service: General;  Laterality: Left;   BREAST RECONSTRUCTION WITH PLACEMENT OF TISSUE EXPANDER AND FLEX HD (ACELLULAR HYDRATED DERMIS) Left 01/06/2021   Procedure: LEFT BREAST RECONSTRUCTION WITH PLACEMENT OF TISSUE EXPANDER AND ACELLULAR  DERMIS;  Surgeon: Glenna Fellows, MD;  Location: [facility];  Service: Plastics;  Laterality: Left;   MASTECTOMY W/ SENTINEL NODE BIOPSY Left 01/06/2021   Procedure: LEFT MASTECTOMY WITH MAGTRACE;  Surgeon: Almond Lint, MD;  Location: [facility];  Service: General;  Laterality: Left;   PORT-A-CATH REMOVAL Right 07/07/2021   Procedure: REMOVAL PORT-A-CATH;  Surgeon: Almond Lint, MD;  Location: [facility];  Service: General;  Laterality: Right;   PORTACATH PLACEMENT Right 01/19/2021   Procedure: INSERTION PORT-A-CATH;  Surgeon: Almond Lint, MD;  Location: [facility];  Service: General;  Laterality: Right;   RADIOACTIVE SEED GUIDED AXILLARY SENTINEL LYMPH NODE Left 01/06/2021   Procedure: RADIOACTIVE SEED GUIDED SENTINEL LYMPH NODE BIOPSY;  Surgeon: Almond Lint, MD;  Location: [facility];  Service: General;  Laterality: Left;   REMOVAL OF TISSUE EXPANDER AND PLACEMENT OF IMPLANT Left 02/18/2022   Procedure: REMOVAL OF TISSUE EXPANDER AND PLACEMENT OF IMPLANT;  Surgeon: Glenna Fellows, MD;  Location: [facility];  Service: Plastics;  Laterality: Left;   SENTINEL NODE BIOPSY Left 01/06/2021   Procedure: LEFT AXILLARY SENTINEL LYMPH  NODE BIOPSY;  Surgeon: Almond Lint, MD;  Location: [facility];  Service: General;   Laterality: Left;   WISDOM TOOTH EXTRACTION     Allergies  Allergen Reactions   Prochlorperazine Other (See Comments)    Severe restlessness. Akathisia.      Objective:    Physical Exam Vitals and nursing note reviewed.  Constitutional:      Appearance: Normal appearance.  Cardiovascular:     Rate and Rhythm: Normal rate and regular rhythm.  Pulmonary:     Effort: Pulmonary effort is normal.     Breath sounds: Normal breath sounds.  Musculoskeletal:        General: Normal range of motion.  Skin:    General: Skin is warm and dry.  Neurological:     Mental Status: She is alert.  Psychiatric:        Mood and Affect: Mood normal.        Behavior: Behavior normal.    BP 110/64   Pulse 89   Temp 97.8 F (36.6 C) (Temporal)  Ht 5\' 4"  (1.626 m)   Wt 123 lb 12.8 oz (56.2 kg)   LMP  (LMP Unknown)   SpO2 98%   BMI 21.25 kg/m  Wt Readings from Last 3 Encounters:  04/12/23 123 lb 12.8 oz (56.2 kg)  02/16/23 121 lb (54.9 kg)  11/16/22 123 lb 4.8 oz (55.9 kg)      Dulce Sellar, NP

## 2023-04-17 ENCOUNTER — Ambulatory Visit: Payer: BC Managed Care – PPO | Attending: Hematology

## 2023-04-17 VITALS — Wt 120.1 lb

## 2023-04-17 DIAGNOSIS — Z483 Aftercare following surgery for neoplasm: Secondary | ICD-10-CM | POA: Insufficient documentation

## 2023-04-17 LAB — SIGNATERA
SIGNATERA MTM READOUT: 0 MTM/ml
SIGNATERA TEST RESULT: NEGATIVE

## 2023-04-17 NOTE — Therapy (Signed)
 OUTPATIENT PHYSICAL THERAPY SOZO SCREENING NOTE   Patient Name: Pamela Reyes MRN: [id number] DOB:April 05, 1980, 43 y.o., female Today's Date: 04/17/2023  PCP: Dulce Sellar, NP REFERRING PROVIDER: Malachy Mood, MD   PT End of Session - 04/17/23 [id number]     Visit Number 6   # unchanged due to screen only   PT Start Time 0820    PT Stop Time 0824    PT Time Calculation (min) 4 min    Activity Tolerance Patient tolerated treatment well    Behavior During Therapy [facility] for tasks assessed/performed             Past Medical History:  Diagnosis Date   Anxiety    Axillary pain, right 01/18/2021   Will get right axillary ultrasound.   Breast cancer (HCC)    Breast pain, left 10/06/2020   Encounter for gynecological examination with Papanicolaou smear of cervix 02/10/2021   Encounter for screening fecal occult blood testing 02/10/2021   Family history of breast cancer 11/11/2020   Fibroid 10/27/2020   Has 1 x .9 x .6 cm subserosal fibroid   H/O left mastectomy    left mastectomy 2022   History of hiatal hernia    Left breast mass 10/06/2020   +breast cancer   LLQ pain 10/06/2020   PONV (postoperative nausea and vomiting)    Port-A-Cath in place 03/25/2021   Past Surgical History:  Procedure Laterality Date   AXILLARY LYMPH NODE DISSECTION Left 07/07/2021   Procedure: LEFT AXILLARY LYMPH NODE DISSECTION;  Surgeon: Almond Lint, MD;  Location: [facility];  Service: General;  Laterality: Left;   BREAST RECONSTRUCTION WITH PLACEMENT OF TISSUE EXPANDER AND FLEX HD (ACELLULAR HYDRATED DERMIS) Left 01/06/2021   Procedure: LEFT BREAST RECONSTRUCTION WITH PLACEMENT OF TISSUE EXPANDER AND ACELLULAR  DERMIS;  Surgeon: Glenna Fellows, MD;  Location: [facility];  Service: Plastics;  Laterality: Left;   MASTECTOMY W/ SENTINEL NODE BIOPSY Left 01/06/2021   Procedure: LEFT MASTECTOMY WITH MAGTRACE;  Surgeon: Almond Lint, MD;  Location: [facility];  Service: General;  Laterality:  Left;   PORT-A-CATH REMOVAL Right 07/07/2021   Procedure: REMOVAL PORT-A-CATH;  Surgeon: Almond Lint, MD;  Location: [facility];  Service: General;  Laterality: Right;   PORTACATH PLACEMENT Right 01/19/2021   Procedure: INSERTION PORT-A-CATH;  Surgeon: Almond Lint, MD;  Location: [facility];  Service: General;  Laterality: Right;   RADIOACTIVE SEED GUIDED AXILLARY SENTINEL LYMPH NODE Left 01/06/2021   Procedure: RADIOACTIVE SEED GUIDED SENTINEL LYMPH NODE BIOPSY;  Surgeon: Almond Lint, MD;  Location: [facility];  Service: General;  Laterality: Left;   REMOVAL OF TISSUE EXPANDER AND PLACEMENT OF IMPLANT Left 02/18/2022   Procedure: REMOVAL OF TISSUE EXPANDER AND PLACEMENT OF IMPLANT;  Surgeon: Glenna Fellows, MD;  Location: [facility];  Service: Plastics;  Laterality: Left;   SENTINEL NODE BIOPSY Left 01/06/2021   Procedure: LEFT AXILLARY SENTINEL LYMPH  NODE BIOPSY;  Surgeon: Almond Lint, MD;  Location: [facility];  Service: General;  Laterality: Left;   WISDOM TOOTH EXTRACTION     Patient Active Problem List   Diagnosis Date Noted   History of breast cancer 06/02/2022   Vaginal atrophy 06/02/2022   Burning with urination 10/25/2021   Urinary frequency 10/25/2021   Urgency of urination 10/25/2021   Left flank pain, chronic 09/27/2021   Paresthesia 06/08/2021   Port-A-Cath in place 03/25/2021   Encounter for screening fecal occult blood testing 02/10/2021   Encounter for gynecological examination with  Papanicolaou smear of cervix 02/10/2021   Genetic testing 11/25/2020   Family history of breast cancer 11/11/2020   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 lymph node, left (HCC) 11/11/2020   Dense breasts 11/11/2020   Malignant neoplasm of upper-outer quadrant of left breast in female, estrogen receptor positive (HCC) 11/10/2020   Fibroid 10/27/2020    REFERRING DIAG: left breast cancer at risk for lymphedema  THERAPY DIAG: Aftercare following surgery for  neoplasm  PERTINENT HISTORY: Diagnosed 10/29/2020 and found by self exam. Biopsy revealed IDC ER, PR +, HER 2 -.  Pt underwent left mastectomy on 01/06/2021 with expander. 4+/5 LN. Chemotherapy completed ACP, Lymph node dissection on 07/07/21 with 1/11 lymph nodes positive. Completed radiation at end of August 2023   PRECAUTIONS: left UE Lymphedema risk, None  SUBJECTIVE: Pt returns for her first 6 month L-dex screen.   PAIN:  Are you having pain? No  SOZO SCREENING: Patient was assessed today using the SOZO machine to determine the lymphedema index score. This was compared to her baseline score. It was determined that she is within the recommended range when compared to her baseline and no further action is needed at this time. She will continue SOZO screenings. These are done every 3 months for 2 years post operatively followed by every 6 months for 2 years, and then annually.   L-DEX FLOWSHEETS - 04/17/23 0800       L-DEX LYMPHEDEMA SCREENING   Measurement Type Unilateral    L-DEX MEASUREMENT EXTREMITY Upper Extremity    POSITION  Standing    DOMINANT SIDE Right    At Risk Side Left    BASELINE SCORE (UNILATERAL) -0.2    L-DEX SCORE (UNILATERAL) -4.1    VALUE CHANGE (UNILAT) -3.9            P: Cont 6 month L-dex screens x 2 years.    Hermenia Bers, PTA 04/17/2023, 8:24 AM

## 2023-04-19 ENCOUNTER — Encounter: Payer: Self-pay | Admitting: Physician Assistant

## 2023-05-08 ENCOUNTER — Other Ambulatory Visit: Payer: Self-pay | Admitting: Hematology

## 2023-05-16 ENCOUNTER — Other Ambulatory Visit: Payer: Self-pay | Admitting: Nurse Practitioner

## 2023-05-16 DIAGNOSIS — Z170 Estrogen receptor positive status [ER+]: Secondary | ICD-10-CM

## 2023-05-16 NOTE — Progress Notes (Signed)
 Patient Care Team: Dulce Sellar, NP as PCP - General (Family Medicine) Pershing Proud, RN as Oncology Nurse Navigator Donnelly Angelica, RN as Oncology Nurse Navigator Almond Lint, MD as Consulting Physician (General Surgery) Malachy Mood, MD as Consulting Physician (Hematology) Dorothy Puffer, MD as Consulting Physician (Radiation Oncology) Adline Potter, NP as Nurse Practitioner (Obstetrics and Gynecology) Pollyann Samples, NP as Nurse Practitioner (Nurse Practitioner)  Clinic Day:  05/23/2023  Referring physician: Dulce Sellar, NP  ASSESSMENT & PLAN:   Assessment & Plan: Malignant neoplasm of upper-outer quadrant of left breast in female, 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 Biopsy on 11/05/20 confirmed invasive ductal carcinoma, with metastatic involvement of one lymph node. -staging CT CAP and bone scan on 11/19/20 showed no distant metastasis -she underwent left mastectomy on 01/06/21 under Dr. Donell Beers. Pathology showed 6.5 cm invasive ductal carcinoma, grade 2, involving dermis of nipple without epidermal involvement. Margins were negative, but all 4 lymph nodes showed metastatic disease. -s/p 4 cycles AC 02/11/21 - 03/25/21 and Abraxane 04/08/21 - 06/24/21. -PET scan on 06/16/21 showed no convincing evidence of local recurrence or metastatic disease. -left ALND on 07/07/21 with Dr. Donell Beers, path showed 1/11 positive nodes -s/p postmastectomy radiation by Dr. Mitzi Hansen 08/16/21 - 10/04/21. -she started Zoladex in 07/07/21 and letrozole on 07/21/21. She developed worsening joint pain and was switched to exemestane in early 10/2021. But due to worsening joint pain, urinary sx, and negative QOL we switched to Tamoxifen (and stopped zoladex) in 04/2022  -she started Verzenio 100mg  BID on 10/28/21, dose increased to 150mg  bid later. She  had UTI, fatigue etc, but overall tolerating better now -she had reconstructive surgery on February 18, 2022, Verzenio was held before surgery. - Signatera test for circulating tumor DNA was negative on 04/11/2023. -Continue tamoxifen 20 mg daily and Verzenio 150 mg twice daily.   Plan:  Reviewed labs.  Very mild neutropenia.  All other labs are stable and unremarkable. Signatera testing done 04/11/2023 was negative. Tolerating tamoxifen and Verzenio with few negative side effects.  All are manageable. Continue tamoxifen 20 mg daily. Continue Verzenio 150 mg twice daily. Follow-up in 3 months and sooner if needed.  The patient understands the plans discussed today and is in agreement with them.  She knows to contact our office if she develops concerns prior to her next appointment.  I provided 20 minutes of face-to-face time during this encounter and > 50% was spent counseling as documented under my assessment and plan.    Carlean Jews, NP  [facility] - A DEPT OF [facility] [address] [place] [address] Dept: [phone] Dept Fax: [phone]   No orders of the defined types were placed in this encounter.     CHIEF COMPLAINT:  CC: Left breast cancer, estrogen receptor positive  Current Treatment: Adjuvant tamoxifen and Verzenio  INTERVAL HISTORY:  Pamela Reyes is here today for repeat clinical assessment.  She was last seen on 02/16/2023.  Most recent 3D mammogram of the right breast was done 03/08/2023.  Results were negative.  Previously, nuclear medicine bone scan was done 09/02/2022.  There was no evidence of osseous metastatic disease.  Most recent Signatera testing done for circulating tumor DNA done on 04/11/2023 was negative. She denies chest pain, chest pressure, or shortness of breath. She  denies headaches or visual disturbances. She denies abdominal pain, nausea, vomiting, or changes in bowel or bladder  habits.   She denies fevers or chills. She denies pain. Her appetite is good. Her weight has been stable.  I have reviewed the past medical history, past surgical history, social history and family history with the patient and they are unchanged from previous note.  ALLERGIES:  is allergic to prochlorperazine.  MEDICATIONS:  Current Outpatient Medications  Medication Sig Dispense Refill   acetaminophen (TYLENOL) 500 MG tablet Take 500-1,000 mg by mouth every 6 (six) hours as needed for moderate pain or headache.     Biotin 1 MG CAPS Take by mouth.     Cholecalciferol (DIALYVITE VITAMIN D 5000) 125 MCG (5000 UT) capsule Take 5,000 Units by mouth daily. Vit D and K supplement combined     diazepam (VALIUM) 5 MG tablet Place 1 tablet vaginally nightly as needed for muscle spasm/ pelvic pain. 30 tablet 0   estradiol (ESTRACE) 0.1 MG/GM vaginal cream PLACE 0.5G NIGHTLY FOR TWO WEEKS THEN TWICE A WEEK AFTER 126 g 4   gabapentin (NEURONTIN) 100 MG capsule Take 1 capsule (100 mg total) by mouth 3 (three) times daily. 90 capsule 2   hydrOXYzine (ATARAX) 10 MG tablet TAKE 1 TABLET (10 MG TOTAL) BY MOUTH DAILY AS NEEDED FOR ANXIETY, MAY TAKE 2 TIMES A DAY IF NEEDED 180 tablet 1   loratadine (CLARITIN) 10 MG tablet Take 10 mg by mouth daily.     omeprazole (PRILOSEC) 20 MG capsule TAKE 1 CAPSULE BY MOUTH EVERY DAY 90 capsule 1   tamoxifen (NOLVADEX) 20 MG tablet TAKE 1 TABLET BY MOUTH EVERY DAY 90 tablet 1   tretinoin (RETIN-A) 0.025 % cream Apply 1 application  topically at bedtime.     VERZENIO 150 MG tablet TAKE 1 TABLET BY MOUTH 2 TIMES A DAY 14 tablet 3   loperamide (IMODIUM) 2 MG capsule Take 1 capsule (2 mg total) by mouth 4 (four) times daily as needed for diarrhea or loose stools. 12 capsule 0   ondansetron (ZOFRAN-ODT) 4 MG disintegrating tablet Take 1 tablet (4 mg total) by mouth every 8 (eight) hours as needed for nausea or vomiting. 20 tablet 0   No current facility-administered medications  for this visit.    HISTORY OF PRESENT ILLNESS:   Oncology History Overview Note   Cancer Staging  Malignant neoplasm of upper-outer quadrant of left breast in female, estrogen receptor positive (HCC) Staging form: Breast, AJCC 8th Edition - Clinical stage from 11/05/2020: Stage IIA (cT2, cN1, cM0, G1, ER+, PR+, HER2: Equivocal) - Signed by Sonja [place], MD on 11/10/2020 - Pathologic stage from 01/06/2021: Stage IB (pT3, pN2a, cM0, G2, ER+, PR+, HER2-) - Signed by Sonja Catano, MD on 01/12/2021     Malignant neoplasm of upper-outer quadrant of left breast in female, estrogen receptor positive (HCC)  10/28/2020 Mammogram   Bilateral Diagnostic Mammogram; Left Breast Ultrasound  IMPRESSION: 1. Suspicious palpable mass/hypoechoic area in the left breast at 12 o'clock retroareolar measuring 4.5 cm.   2.  Suspicious lymph node in the left axilla.   11/05/2020 Cancer Staging   Staging form: Breast, AJCC 8th Edition - Clinical stage from 11/05/2020: Stage IIA (cT2, cN1, cM0, G1, ER+, PR+, HER2: Equivocal) - Signed by Sonja Antoine, MD on 11/10/2020 Stage prefix: Initial diagnosis Histologic grading system: 3 grade system   11/05/2020 Pathology Results   Diagnosis 1. Breast, left, needle core biopsy, 12 o'clock subareolar, ribbon clip - INVASIVE  MAMMARY CARCINOMA. SEE NOTE 1. Carcinoma measures 1.5 cm in greatest linear dimension and appears grade 2. 1. Immunohistochemical stain for E-cadherin is positive in the tumor cells, consistent with a ductal phenotype. 1. PROGNOSTIC INDICATORS Results: The tumor cells are EQUIVOCAL for Her2 (2+). Her2 by FISH will be performed and results reported separately. Estrogen Receptor: 70%, POSITIVE, STRONG-MODERATE STAINING INTENSITY Progesterone Receptor: 80%, POSITIVE, MODERATE STAINING INTENSITY Proliferation Marker Ki67: 1%  2. Lymph node, needle/core biopsy, left axilla, tribell clip - METASTATIC CARCINOMA TO A LYMPH NODE. SEE NOTE 2. Largest contiguous focus  of metastatic carcinoma measures 0.3 cm. 2. PROGNOSTIC INDICATORS Results: The tumor cells are EQUIVOCAL for Her2 (2+). Her2 by FISH will be performed and results reported separately. Estrogen Receptor: 90%, POSITIVE, STRONG STAINING INTENSITY Progesterone Receptor: 95%, POSITIVE, STRONG STAINING INTENSITY Proliferation Marker Ki67: 1%   11/10/2020 Initial Diagnosis   Malignant neoplasm of upper-outer quadrant of left breast in female, estrogen receptor positive (HCC)   11/19/2020 Imaging   EXAM: CT CHEST, ABDOMEN, AND PELVIS WITH CONTRAST  IMPRESSION: 1. Left breast mass  as previously described. 2. Enhancing subcentimeter left axillary lymph nodes. 3. No evidence of distal metastatic disease. 4. Small hiatal hernia   11/19/2020 Imaging   EXAM: NUCLEAR MEDICINE WHOLE BODY BONE SCAN  IMPRESSION: No evidence of metastatic disease.   11/21/2020 Imaging   EXAM: BILATERAL BREAST MRI WITH AND WITHOUT CONTRAST  IMPRESSION: 1. Suspicious 1.3 centimeter mass in the anterior UPPER central RIGHT breast warranting tissue diagnosis. (Image 65 of series 7). 2. 4 millimeter possible satellite nodule posterior to the 1.3 centimeter mass in the RIGHT breast. (Image 63 of series 7). 3. Indeterminate oval mass in the UPPER central middle depth of the RIGHT breast warranting tissue diagnosis. (Image 60 of series 7). 4. Large area of enhancement in the central portion of the LEFT breast, involving all quadrants. There is associated enhancement and retraction of the LEFT nipple, with significantly smaller size of the LEFT breast. Enhancement spans at least 6.5 centimeters. 5. Enlarged, previously biopsied LEFT axillary lymph node.   11/24/2020 Genetic Testing   Negative hereditary cancer genetic testing: no pathogenic variants detected in [employer] BRCAPlus STAT Panel or [employer] CustomNext-Cancer +RNAinsight Panel.  The report dates are November 24, 2020 and November 27, 2020, respectively.   The  BRCAplus panel offered by [employer] and includes sequencing and deletion/duplication analysis for the following 8 genes: ATM, BRCA1, BRCA2, CDH1, CHEK2, PALB2, PTEN, and TP53.  The CustomNext-Cancer+RNAinsight panel offered by [employer] includes sequencing and rearrangement analysis for the following 47 genes:  APC, ATM, AXIN2, BARD1, BMPR1A, BRCA1, BRCA2, BRIP1, CDH1, CDK4, CDKN2A, CHEK2, DICER1, EPCAM, GREM1, HOXB13, MEN1, MLH1, MSH2, MSH3, MSH6, MUTYH, NBN, NF1, NF2, NTHL1, PALB2, PMS2, POLD1, POLE, PTEN, RAD51C, RAD51D, RECQL, RET, SDHA, SDHAF2, SDHB, SDHC, SDHD, SMAD4, SMARCA4, STK11, TP53, TSC1, TSC2, and VHL.  RNA data is routinely analyzed for use in variant interpretation for all genes.    11/27/2020 Pathology Results   Diagnosis Breast, right, needle core biopsy, anterior upper central - FIBROADENOMATOID AND FIBROCYSTIC CHANGES WITH CALCIFICATIONS - PSEUDOANGIOMATOUS STROMAL HYPERPLASIA - FOCAL PERIDUCTULAR CHRONIC INFLAMMATION - NO MALIGNANCY IDENTIFIED   01/06/2021 Cancer Staging   Staging form: Breast, AJCC 8th Edition - Pathologic stage from 01/06/2021: Stage IB (pT3, pN2a, cM0, G2, ER+, PR+, HER2-) - Signed by Sonja Yale, MD on 01/12/2021 Stage prefix: Initial diagnosis Histologic grading system: 3 grade system Residual tumor (R): R0 - None   01/06/2021 Definitive Surgery   FINAL MICROSCOPIC  DIAGNOSIS:   A. LYMPH NODE, LEFT AXILLARY, SENTINEL, EXCISION:  - Metastatic carcinoma to a lymph node (1/1)  - Focus of metastatic carcinoma measures 1.5 cm without evidence of extranodal extension   B. 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 BREAST, LEFT INFERIOLATERAL MARGIN, EXCISION:  - Benign fibroadipose tissue  - Negative for carcinoma   D. LYMPH NODE, LEFT AXILLARY #1,  SENTINEL, EXCISION:  - Metastatic carcinoma to a lymph node (1/1)  - Focus of metastatic carcinoma measures 1.4 cm without evidence of extranodal extension   E. LYMPH NODE, LEFT AXILLARY #2, SENTINEL, EXCISION:  - Metastatic carcinoma to a lymph node (1/1)  - Focus of metastatic carcinoma measures 0.5 cm without evidence of extranodal extension   F. LYMPH NODE, LEFT AXILLARY #3, SENTINEL, EXCISION:  - Metastatic carcinoma to a lymph node (1/1)  - Focus of metastatic carcinoma measures 0.9 cm without evidence of extranodal extension   G. LYMPH NODE, LEFT AXILLARY #4, SENTINEL, EXCISION:  - Benign fibroadipose tissue, negative for carcinoma  - Lymphoid tissue is not identified    02/11/2021 - 06/24/2021 Chemotherapy   Patient is on Treatment Plan : BREAST ADJUVANT DOSE DENSE AC q14d / PACLitaxel q7d     06/16/2021 PET scan   IMPRESSION: 1. Postsurgical change of left mastectomy and left axillary lymph node dissection with tissue expander in place. Mild hypermetabolic activity about the surgical site without suspicious focal hypermetabolic soft tissue nodularity, favored to reflect postsurgical change.   2. No convincing scintigraphic evidence of hypermetabolic local recurrence or metastatic disease within the neck, chest, abdomen or pelvis.       REVIEW OF SYSTEMS:   Constitutional: Denies fevers, chills or abnormal weight loss Eyes: Denies blurriness of vision Ears, nose, mouth, throat, and face: Denies mucositis or sore throat Respiratory: Denies cough, dyspnea or wheezes Cardiovascular: Denies palpitation, chest discomfort or lower extremity swelling Gastrointestinal:  Denies nausea, heartburn or change in bowel habits Skin: Denies abnormal skin rashes Lymphatics: Denies new lymphadenopathy or easy bruising Neurological:Denies numbness, tingling or new weaknesses Behavioral/Psych: Mood is stable, no new changes  All other systems were reviewed with the patient and are  negative.   VITALS:   Today's Vitals   05/17/23 1110  BP: 120/64  Pulse: 85  Resp: 17  Temp: (!) 97.5 F (36.4 C)  SpO2: 98%  Weight: 125 lb 11.2 oz (57 kg)  PainSc: 0-No pain   Body mass index is 21.58 kg/m.   Wt Readings from Last 3 Encounters:  05/17/23 125 lb 11.2 oz (57 kg)  04/17/23 120 lb 2 oz (54.5 kg)  04/12/23 123 lb 12.8 oz (56.2 kg)    Body mass index is 21.58 kg/m.  Performance status (ECOG): 1 - Symptomatic but completely ambulatory  PHYSICAL EXAM:   GENERAL:alert, no distress and comfortable SKIN: skin color, texture, turgor are normal, no rashes or significant lesions EYES: normal, Conjunctiva are pink and non-injected, sclera clear OROPHARYNX:no exudate, no erythema and lips, buccal mucosa, and tongue normal  NECK: supple, thyroid normal size, non-tender, without nodularity LYMPH:  no palpable lymphadenopathy in the cervical, axillary or inguinal LUNGS: clear to auscultation and percussion with normal breathing effort HEART: regular rate & rhythm and no murmurs and no lower extremity edema ABDOMEN:abdomen soft, non-tender and normal  bowel sounds Musculoskeletal:no cyanosis of digits and no clubbing  NEURO: alert & oriented x 3 with fluent speech, no focal motor/sensory deficits  LABORATORY DATA:  I have reviewed the data as listed    Component Value Date/Time   NA 138 05/18/2023 0208   K 3.9 05/18/2023 0208   CL 108 05/18/2023 0208   CO2 19 (L) 05/18/2023 0208   GLUCOSE 96 05/18/2023 0208   BUN 18 05/18/2023 0208   CREATININE 0.94 05/18/2023 0208   CREATININE 0.88 05/17/2023 1026   CALCIUM 9.0 05/18/2023 0208   PROT 6.6 05/18/2023 0208   ALBUMIN 3.7 05/18/2023 0208   AST 21 05/18/2023 0208   AST 18 05/17/2023 1026   ALT 16 05/18/2023 0208   ALT 12 05/17/2023 1026   ALKPHOS 26 (L) 05/18/2023 0208   BILITOT 0.8 05/18/2023 0208   BILITOT 0.4 05/17/2023 1026   GFRNONAA >60 05/18/2023 0208   GFRNONAA >60 05/17/2023 1026    Lab Results   Component Value Date   WBC 3.4 (L) 05/18/2023   NEUTROABS 1.2 (L) 05/17/2023   HGB 12.7 05/18/2023   HCT 36.9 05/18/2023   MCV 96.3 05/18/2023   PLT 157 05/18/2023

## 2023-05-16 NOTE — Assessment & Plan Note (Addendum)
 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 Biopsy on 11/05/20 confirmed invasive ductal carcinoma, with metastatic involvement of one lymph node. -staging CT CAP and bone scan on 11/19/20 showed no distant metastasis -she underwent left mastectomy on 01/06/21 under Dr. Cherlynn Cornfield. Pathology showed 6.5 cm invasive ductal carcinoma, grade 2, involving dermis of nipple without epidermal involvement. Margins were negative, but all 4 lymph nodes showed metastatic disease. -s/p 4 cycles AC 02/11/21 - 03/25/21 and Abraxane 04/08/21 - 06/24/21. -PET scan on 06/16/21 showed no convincing evidence of local recurrence or metastatic disease. -left ALND on 07/07/21 with Dr. Cherlynn Cornfield, path showed 1/11 positive nodes -s/p postmastectomy radiation by Dr. Jeryl Moris 08/16/21 - 10/04/21. -she started Zoladex in 07/07/21 and letrozole on 07/21/21. She developed worsening joint pain and was switched to exemestane in early 10/2021. But due to worsening joint pain, urinary sx, and negative QOL we switched to Tamoxifen (and stopped zoladex) in 04/2022  -she started Verzenio 100mg  BID on 10/28/21, dose increased to 150mg  bid later. She had UTI, fatigue etc, but overall tolerating better now -she had reconstructive surgery on February 18, 2022, Verzenio was held before surgery. - Signatera test for circulating tumor DNA was negative on 04/11/2023. -Continue tamoxifen 20 mg daily and Verzenio 150 mg twice daily.

## 2023-05-17 ENCOUNTER — Inpatient Hospital Stay: Payer: BC Managed Care – PPO | Attending: Hematology

## 2023-05-17 ENCOUNTER — Inpatient Hospital Stay (HOSPITAL_BASED_OUTPATIENT_CLINIC_OR_DEPARTMENT_OTHER): Payer: Self-pay | Admitting: Nurse Practitioner

## 2023-05-17 VITALS — BP 120/64 | HR 85 | Temp 97.5°F | Resp 17 | Wt 125.7 lb

## 2023-05-17 DIAGNOSIS — Z170 Estrogen receptor positive status [ER+]: Secondary | ICD-10-CM | POA: Insufficient documentation

## 2023-05-17 DIAGNOSIS — C50412 Malignant neoplasm of upper-outer quadrant of left female breast: Secondary | ICD-10-CM | POA: Insufficient documentation

## 2023-05-17 DIAGNOSIS — Z1721 Progesterone receptor positive status: Secondary | ICD-10-CM | POA: Diagnosis not present

## 2023-05-17 DIAGNOSIS — Z79810 Long term (current) use of selective estrogen receptor modulators (SERMs): Secondary | ICD-10-CM | POA: Insufficient documentation

## 2023-05-17 DIAGNOSIS — Z9012 Acquired absence of left breast and nipple: Secondary | ICD-10-CM | POA: Diagnosis not present

## 2023-05-17 DIAGNOSIS — Z1732 Human epidermal growth factor receptor 2 negative status: Secondary | ICD-10-CM | POA: Diagnosis not present

## 2023-05-17 LAB — CBC WITH DIFFERENTIAL (CANCER CENTER ONLY)
Abs Immature Granulocytes: 0 10*3/uL (ref 0.00–0.07)
Basophils Absolute: 0 10*3/uL (ref 0.0–0.1)
Basophils Relative: 1 %
Eosinophils Absolute: 0 10*3/uL (ref 0.0–0.5)
Eosinophils Relative: 1 %
HCT: 34.5 % — ABNORMAL LOW (ref 36.0–46.0)
Hemoglobin: 12 g/dL (ref 12.0–15.0)
Immature Granulocytes: 0 %
Lymphocytes Relative: 44 %
Lymphs Abs: 1.1 10*3/uL (ref 0.7–4.0)
MCH: 32.8 pg (ref 26.0–34.0)
MCHC: 34.8 g/dL (ref 30.0–36.0)
MCV: 94.3 fL (ref 80.0–100.0)
Monocytes Absolute: 0.2 10*3/uL (ref 0.1–1.0)
Monocytes Relative: 7 %
Neutro Abs: 1.2 10*3/uL — ABNORMAL LOW (ref 1.7–7.7)
Neutrophils Relative %: 47 %
Platelet Count: 170 10*3/uL (ref 150–400)
RBC: 3.66 MIL/uL — ABNORMAL LOW (ref 3.87–5.11)
RDW: 13.2 % (ref 11.5–15.5)
WBC Count: 2.6 10*3/uL — ABNORMAL LOW (ref 4.0–10.5)
nRBC: 0 % (ref 0.0–0.2)

## 2023-05-17 LAB — CMP (CANCER CENTER ONLY)
ALT: 12 U/L (ref 0–44)
AST: 18 U/L (ref 15–41)
Albumin: 4.3 g/dL (ref 3.5–5.0)
Alkaline Phosphatase: 27 U/L — ABNORMAL LOW (ref 38–126)
Anion gap: 7 (ref 5–15)
BUN: 14 mg/dL (ref 6–20)
CO2: 27 mmol/L (ref 22–32)
Calcium: 9.5 mg/dL (ref 8.9–10.3)
Chloride: 106 mmol/L (ref 98–111)
Creatinine: 0.88 mg/dL (ref 0.44–1.00)
GFR, Estimated: >60 mL/min (ref >60)
Glucose, Bld: 85 mg/dL (ref 70–99)
Potassium: 4.7 mmol/L (ref 3.5–5.1)
Sodium: 140 mmol/L (ref 135–145)
Total Bilirubin: 0.4 mg/dL (ref 0.0–1.2)
Total Protein: 7 g/dL (ref 6.5–8.1)

## 2023-05-18 ENCOUNTER — Emergency Department (HOSPITAL_COMMUNITY)
Admission: EM | Admit: 2023-05-18 | Discharge: 2023-05-18 | Disposition: A | Discharge status: Home / Self Care | Attending: Emergency Medicine | Admitting: Emergency Medicine

## 2023-05-18 ENCOUNTER — Other Ambulatory Visit: Payer: Self-pay

## 2023-05-18 DIAGNOSIS — Z853 Personal history of malignant neoplasm of breast: Secondary | ICD-10-CM | POA: Insufficient documentation

## 2023-05-18 DIAGNOSIS — R197 Diarrhea, unspecified: Secondary | ICD-10-CM | POA: Insufficient documentation

## 2023-05-18 DIAGNOSIS — R112 Nausea with vomiting, unspecified: Secondary | ICD-10-CM | POA: Diagnosis present

## 2023-05-18 LAB — COMPREHENSIVE METABOLIC PANEL WITH GFR
ALT: 16 U/L (ref 0–44)
AST: 21 U/L (ref 15–41)
Albumin: 3.7 g/dL (ref 3.5–5.0)
Alkaline Phosphatase: 26 U/L — ABNORMAL LOW (ref 38–126)
Anion gap: 11 (ref 5–15)
BUN: 18 mg/dL (ref 6–20)
CO2: 19 mmol/L — ABNORMAL LOW (ref 22–32)
Calcium: 9 mg/dL (ref 8.9–10.3)
Chloride: 108 mmol/L (ref 98–111)
Creatinine, Ser: 0.94 mg/dL (ref 0.44–1.00)
GFR, Estimated: >60 mL/min (ref >60)
Glucose, Bld: 96 mg/dL (ref 70–99)
Potassium: 3.9 mmol/L (ref 3.5–5.1)
Sodium: 138 mmol/L (ref 135–145)
Total Bilirubin: 0.8 mg/dL (ref 0.0–1.2)
Total Protein: 6.6 g/dL (ref 6.5–8.1)

## 2023-05-18 LAB — CANCER ANTIGEN 27.29: CA 27.29: 31.3 U/mL (ref 0.0–38.6)

## 2023-05-18 LAB — CBC
HCT: 36.9 % (ref 36.0–46.0)
Hemoglobin: 12.7 g/dL (ref 12.0–15.0)
MCH: 33.2 pg (ref 26.0–34.0)
MCHC: 34.4 g/dL (ref 30.0–36.0)
MCV: 96.3 fL (ref 80.0–100.0)
Platelets: 157 10*3/uL (ref 150–400)
RBC: 3.83 MIL/uL — ABNORMAL LOW (ref 3.87–5.11)
RDW: 13.3 % (ref 11.5–15.5)
WBC: 3.4 10*3/uL — ABNORMAL LOW (ref 4.0–10.5)
nRBC: 0 % (ref 0.0–0.2)

## 2023-05-18 LAB — LIPASE, BLOOD: Lipase: 50 U/L (ref 11–51)

## 2023-05-18 LAB — HCG, SERUM, QUALITATIVE: Preg, Serum: NEGATIVE

## 2023-05-18 MED ORDER — LOPERAMIDE HCL 2 MG PO CAPS: 2.0000 mg | ORAL_CAPSULE | Freq: Four times a day (QID) | ORAL | 0 refills | Status: AC | PRN

## 2023-05-18 MED ORDER — SODIUM CHLORIDE 0.9 % IV BOLUS
1000.0000 mL | Freq: Once | INTRAVENOUS | Status: AC
  Administered 2023-05-18: 1000 mL via INTRAVENOUS

## 2023-05-18 MED ORDER — ONDANSETRON 4 MG PO TBDP: 4.0000 mg | ORAL_TABLET | Freq: Three times a day (TID) | ORAL | 0 refills | Status: AC | PRN

## 2023-05-18 MED ORDER — ONDANSETRON HCL 4 MG/2ML IJ SOLN
4.0000 mg | Freq: Once | INTRAMUSCULAR | Status: AC
  Administered 2023-05-18: 4 mg via INTRAVENOUS
  Filled 2023-05-18: qty 2

## 2023-05-18 NOTE — ED Notes (Signed)
 Urine on counter if needed.

## 2023-05-18 NOTE — ED Triage Notes (Addendum)
 Pt bib [facility] c/o N/V/D, per EMS pt was hypotensive 90/54, was given NS and 4mg  of Zofran PTA

## 2023-05-18 NOTE — Discharge Instructions (Signed)
 You were evaluated in the Emergency Department and after careful evaluation, we did not find any emergent condition requiring admission or further testing in the hospital.  Your exam/testing today is overall reassuring.  Symptoms likely due to a viral gastroenteritis, possibly food poisoning.  Recommend plenty of fluids and rest, restricting your diet for the next day or 2 with slow progression as tolerated.  Use the Zofran as needed for nausea, use the Imodium as needed for diarrhea.  Please return to the Emergency Department if you experience any worsening of your condition.   Thank you for allowing [place] to be a part of your care.

## 2023-05-18 NOTE — ED Provider Notes (Signed)
 AP-EMERGENCY DEPT [facility] Emergency Department Provider Note MRN:  [id number]  Arrival date & time: 05/18/23     Chief Complaint   Emesis   History of Present Illness   Pamela Reyes is a 43 y.o. year-old female with a history of breast cancer presenting to the ED with chief complaint of emesis.  Persistent nausea vomiting and diarrhea over the past 5 hours.  Began soon after eating dinner, leftovers from the night before.  Denies fever, no significant abdominal pain.  Cannot keep anything down.  Review of Systems  A thorough review of systems was obtained and all systems are negative except as noted in the HPI and PMH.   Patient's Health History    Past Medical History:  Diagnosis Date   Anxiety    Axillary pain, right 01/18/2021   Will get right axillary ultrasound.   Breast cancer (HCC)    Breast pain, left 10/06/2020   Encounter for gynecological examination with Papanicolaou smear of cervix 02/10/2021   Encounter for screening fecal occult blood testing 02/10/2021   Family history of breast cancer 11/11/2020   Fibroid 10/27/2020   Has 1 x .9 x .6 cm subserosal fibroid   H/O left mastectomy    left mastectomy 2022   History of hiatal hernia    Left breast mass 10/06/2020   +breast cancer   LLQ pain 10/06/2020   PONV (postoperative nausea and vomiting)    Port-A-Cath in place 03/25/2021    Past Surgical History:  Procedure Laterality Date   AXILLARY LYMPH NODE DISSECTION Left 07/07/2021   Procedure: LEFT AXILLARY LYMPH NODE DISSECTION;  Surgeon: Almond Lint, MD;  Location: [facility];  Service: General;  Laterality: Left;   BREAST RECONSTRUCTION WITH PLACEMENT OF TISSUE EXPANDER AND FLEX HD (ACELLULAR HYDRATED DERMIS) Left 01/06/2021   Procedure: LEFT BREAST RECONSTRUCTION WITH PLACEMENT OF TISSUE EXPANDER AND ACELLULAR  DERMIS;  Surgeon: Glenna Fellows, MD;  Location: [facility];  Service: Plastics;  Laterality: Left;   MASTECTOMY W/  SENTINEL NODE BIOPSY Left 01/06/2021   Procedure: LEFT MASTECTOMY WITH MAGTRACE;  Surgeon: Almond Lint, MD;  Location: [facility] OR;  Service: General;  Laterality: Left;   PORT-A-CATH REMOVAL Right 07/07/2021   Procedure: REMOVAL PORT-A-CATH;  Surgeon: Almond Lint, MD;  Location: [facility];  Service: General;  Laterality: Right;   PORTACATH PLACEMENT Right 01/19/2021   Procedure: INSERTION PORT-A-CATH;  Surgeon: Almond Lint, MD;  Location: [facility];  Service: General;  Laterality: Right;   RADIOACTIVE SEED GUIDED AXILLARY SENTINEL LYMPH NODE Left 01/06/2021   Procedure: RADIOACTIVE SEED GUIDED SENTINEL LYMPH NODE BIOPSY;  Surgeon: Almond Lint, MD;  Location: [facility];  Service: General;  Laterality: Left;   REMOVAL OF TISSUE EXPANDER AND PLACEMENT OF IMPLANT Left 02/18/2022   Procedure: REMOVAL OF TISSUE EXPANDER AND PLACEMENT OF IMPLANT;  Surgeon: Glenna Fellows, MD;  Location: [facility];  Service: Plastics;  Laterality: Left;   SENTINEL NODE BIOPSY Left 01/06/2021   Procedure: LEFT AXILLARY SENTINEL LYMPH  NODE BIOPSY;  Surgeon: Almond Lint, MD;  Location: [facility];  Service: General;  Laterality: Left;   WISDOM TOOTH EXTRACTION      Family History  Problem Relation Age of Onset   Congestive Heart Failure Father    Alcohol abuse Father    Heart disease Father    Breast cancer Sister 57       ER+   Bladder Cancer Maternal Uncle        smoking  hx   Cancer Maternal Uncle        kidney or prostate; dx after 50   Breast cancer Paternal Aunt        dx after 66; x2 paternal aunts   Stroke Maternal Grandmother    Lung cancer Maternal Grandfather        dx after 61; smoking hx   Breast cancer Paternal Grandmother        dx after 24    Social History   Socioeconomic History   Marital status: Significant Other    Spouse name: Not on file   Number of children: 0   Years of education: Not on file   Highest education level: Bachelor's degree (e.g., BA, AB, BS)   Occupational History   Not on file  Tobacco Use   Smoking status: Former    Current packs/day: 0.00    Average packs/day: 0.3 packs/day for 4.0 years (1.0 ttl pk-yrs)    Types: Cigarettes    Start date: 10/13/1997    Quit date: 10/13/2001    Years since quitting: 21.6   Smokeless tobacco: Never   Tobacco comments:    I was a light smoker in my teens  Vaping Use   Vaping status: Never Used  Substance and Sexual Activity   Alcohol use: Yes    Alcohol/week: 8.0 standard drinks of alcohol    Types: 8 Shots of liquor per week    Comment: very rarely   Drug use: Never   Sexual activity: Yes    Birth control/protection: Other-see comments    Comment: period stopped with chemo  Other Topics Concern   Not on file  Social History Narrative   Not on file   Social Drivers of Health   Financial Resource Strain: Low Risk  (04/10/2023)   Overall Financial Resource Strain (CARDIA)    Difficulty of Paying Living Expenses: Not hard at all  Food Insecurity: No Food Insecurity (04/10/2023)   Hunger Vital Sign    Worried About Running Out of Food in the Last Year: Never true    Ran Out of Food in the Last Year: Never true  Transportation Needs: No Transportation Needs (04/10/2023)   PRAPARE - Administrator, Civil Service (Medical): No    Lack of Transportation (Non-Medical): No  Physical Activity: Insufficiently Active (04/10/2023)   Exercise Vital Sign    Days of Exercise per Week: 4 days    Minutes of Exercise per Session: 20 min  Stress: Stress Concern Present (04/10/2023)   [employer] of Occupational Health - Occupational Stress Questionnaire    Feeling of Stress : To some extent  Social Connections: Socially Isolated (04/10/2023)   Social Connection and Isolation Panel [NHANES]    Frequency of Communication with Friends and Family: Once a week    Frequency of Social Gatherings with Friends and Family: Once a week    Attends Religious Services: Never    Doctor, general practice or Organizations: No    Attends Banker Meetings: Never    Marital Status: Married  Catering manager Violence: Not At Risk (06/02/2022)   Humiliation, Afraid, Rape, and Kick questionnaire    Fear of Current or Ex-Partner: No    Emotionally Abused: No    Physically Abused: No    Sexually Abused: No     Physical Exam   Vitals:   05/18/23 0127 05/18/23 0200  BP: 107/72 108/64  Pulse: 95 97  Resp: 18 19  Temp: 98.1  F (36.7 C)   SpO2: 100% 100%    CONSTITUTIONAL: Well-appearing, NAD NEURO/PSYCH:  Alert and oriented x 3, no focal deficits EYES:  eyes equal and reactive ENT/NECK:  no LAD, no JVD CARDIO: Regular rate, well-perfused, normal S1 and S2 PULM:  CTAB no wheezing or rhonchi GI/GU:  non-distended, non-tender MSK/SPINE:  No gross deformities, no edema SKIN:  no rash, atraumatic   *Additional and/or pertinent findings included in MDM below  Diagnostic and Interventional Summary    EKG Interpretation Date/Time:    Ventricular Rate:    PR Interval:    QRS Duration:    QT Interval:    QTC Calculation:   R Axis:      Text Interpretation:         Labs Reviewed  CBC - Abnormal; Notable for the following components:      Result Value   WBC 3.4 (*)    RBC 3.83 (*)    All other components within normal limits  COMPREHENSIVE METABOLIC PANEL WITH GFR - Abnormal; Notable for the following components:   CO2 19 (*)    Alkaline Phosphatase 26 (*)    All other components within normal limits  LIPASE, BLOOD  HCG, SERUM, QUALITATIVE    No orders to display    Medications  sodium chloride 0.9 % bolus 1,000 mL (1,000 mLs Intravenous Bolus 05/18/23 0156)  ondansetron (ZOFRAN) injection 4 mg (4 mg Intravenous Given 05/18/23 0156)     Procedures  /  Critical Care Procedures  ED Course and Medical Decision Making  Initial Impression and Ddx Well-appearing in no acute distress, persistent nausea vomiting diarrhea over the past few hours shortly after  a meal would suggest gastroenteritis, specifically food poisoning due to preformed toxin.  Providing symptomatic management with fluids and will check screening labs.  Benign abdomen, no indication for CT imaging at this time.  Past medical/surgical history that increases complexity of ED encounter: Remote history of breast cancer  Interpretation of Diagnostics I personally reviewed the Laboratory Testing and my interpretation is as follows: No significant blood count or electrolyte disturbance.    Patient Reassessment and Ultimate Disposition/Management     Patient doing well on reassessment with no return of symptoms, vital signs normal, abdomen remains soft, appropriate for discharge.  Patient management required discussion with the following services or consulting groups:  None  Complexity of Problems Addressed Acute illness or injury that poses threat of life of bodily function  Additional Data Reviewed and Analyzed Further history obtained from: Past medical history and medications listed in the EMR  Additional Factors Impacting ED Encounter Risk Prescriptions  Elmer Sow. Pilar Plate, MD [facility] Emergency Medicine [facility] [email or web address]  Final Clinical Impressions(s) / ED Diagnoses     ICD-10-CM   1. Nausea vomiting and diarrhea  R11.2    R19.7       ED Discharge Orders          Ordered    ondansetron (ZOFRAN-ODT) 4 MG disintegrating tablet  Every 8 hours PRN        05/18/23 0340    loperamide (IMODIUM) 2 MG capsule  4 times daily PRN        05/18/23 0340             Discharge Instructions Discussed with and Provided to Patient:     Discharge Instructions      You were evaluated in the Emergency Department and after careful evaluation, we did not find any emergent  condition requiring admission or further testing in the hospital.  Your exam/testing today is overall reassuring.  Symptoms likely due to a viral gastroenteritis,  possibly food poisoning.  Recommend plenty of fluids and rest, restricting your diet for the next day or 2 with slow progression as tolerated.  Use the Zofran as needed for nausea, use the Imodium as needed for diarrhea.  Please return to the Emergency Department if you experience any worsening of your condition.   Thank you for allowing [place] to be a part of your care.       Sabas Sous, MD 05/18/23 [phone]

## 2023-05-23 ENCOUNTER — Encounter: Payer: Self-pay | Admitting: Nurse Practitioner

## 2023-06-06 NOTE — Progress Notes (Unsigned)
 06/07/2023 Pamela Reyes [id number] [birth year]  Referring provider: Versa Gore, NP Primary GI doctor: Dr. Yvone Reyes  ASSESSMENT AND PLAN:  Left upper AB pain intermittent x 3 years, worse in the last year 10/19/2021 AB US  normal CT chest AB and pelvis 09/02/22 normal 2024 Bone scan normal Not associated with food, worse with activity, has tried PT, salon pas patches -Repeat AB US , possible spleen enlarged  - if negative US  and EGD, likely MSK, salon pas patches, consider trigger point injection  Nausea with history of HH seen on CT, no vomiting On verzenio  can cause nausea, started on prilosec first few months with the medications and has stayed on it, no GERD If she misses the prilosec she can have GERD, no dysphagia No NSAIDS, no ETOH Nauzine helps -Lifestyle changes discussed, avoid NSAIDS, ETOH, hand out given to the patient I discussed risks of EGD with patient today, including risk of sedation, bleeding or perforation.  Patient provides understanding and gave verbal consent to proceed. 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 Jeryl Reyes follows with oncology last seen in the office 05/17/2023 Negative genetic testing On verzenio  to prevent reoccurence, causes nausea/diarrhea  I have reviewed the clinic note as outlined by Pamela Cull, PA and agree with the assessment, plan and medical decision making.  [name]  presents for evaluation of left upper abdominal pain, nausea, diarrhea and abdominal cramping.  Left upper abdominal pain has been chronic in nature with normal imaging studies.  Suspicion that it may be musculoskeletal in nature.  Patient thinks nausea and diarrhea may be related to Verzenio  being used to treat history of breast cancer.  Agree that if repeat ultrasound is unremarkable can consider EGD.  Patient will be due for colonoscopy at age 43.  Pamela Hess, MD   Patient Care Team: Pamela Gore, NP as PCP - General (Family Medicine) Auther Bo, RN as Oncology Nurse Navigator Alane Hsu, RN as Oncology Nurse Navigator Lockie Rima, MD as Consulting Physician (General Surgery) Sonja Berkshire, MD as Consulting Physician (Hematology) Johna Myers, MD as Consulting Physician (Radiation Oncology) Javan Messing, NP as Nurse Practitioner (Obstetrics and Gynecology) Burton, Lacie K, NP as Nurse Practitioner (Nurse Practitioner)  HISTORY OF PRESENT ILLNESS: 43 y.o. female with a past medical history listed below presents for evaluation of hiatal hernia.   Discussed the use of AI scribe software for clinical note transcription with the patient, who gave verbal consent to proceed.  History of Present Illness   Pamela Reyes is a 43 year old female with breast cancer who presents with ongoing left upper abdominal pain.  She has experienced left upper abdominal pain for the past year, with worsening symptoms over the last few months. The pain is intermittent and not consistently triggered by food or activity. Previous imaging, including a CT scan, bone scan, and an ultrasound in December 2023, did not reveal any abnormalities. A small hiatal hernia was identified during staging  scans for breast cancer, which has not been surgically addressed.  She experiences nausea and diarrhea, which she attributes to her medication, Verzenio , a targeted therapy for breast cancer. Diarrhea  occurs once daily after coffee and is managed with Imodium  as needed, though she prefers to avoid it due to a tendency towards constipation. Prilosec is taken daily, which has helped with nausea, and Zofran  is used occasionally for severe nausea.  Her past medical history includes breast cancer diagnosed in 2022, treated with mastectomy, chemotherapy, and radiation. She is currently on Verzenio  to prevent recurrence. Genetic testing for cancer predisposition was negative, although there is a family history of cancer. She denies alcohol use since her cancer diagnosis and does not take NSAIDs, occasionally using Tylenol  for pain. There is a family history of constipation, with her sister having IBS-C, and she has a lifelong tendency towards constipation.  No vomiting, blood in the stool, heartburn, reflux, or trouble swallowing, although she notes mild epigastric pressure. No shortness of breath or chest discomfort.        She  reports that she quit smoking about 21 years ago. Her smoking use included cigarettes. She started smoking about 25 years ago. She has a 1 pack-year smoking history. She has never used smokeless tobacco. She reports that she does not currently use alcohol after a past usage of about 8.0 standard drinks of alcohol per week. She reports that she does not use drugs.  RELEVANT GI HISTORY, IMAGING AND LABS: Results   RADIOLOGY CT chest, abdomen, pelvis: Normal liver, gallbladder, pancreas, spleen, stomach, and appendix; no bowel wall thickening or inflammatory changes (09/02/2022) Bone scan: No significant findings (Summer 2024) Abdominal ultrasound: No significant findings (01/15/2022)      CBC    Component Value Date/Time   WBC 3.4 (L) 05/18/2023 0208   RBC 3.83 (L) 05/18/2023 0208   HGB 12.7 05/18/2023 0208   HGB 12.0 05/17/2023 1026   HCT 36.9 05/18/2023 0208   PLT 157 05/18/2023 0208   PLT 170 05/17/2023 1026   MCV 96.3 05/18/2023 0208   MCH 33.2 05/18/2023 0208    MCHC 34.4 05/18/2023 0208   RDW 13.3 05/18/2023 0208   LYMPHSABS 1.1 05/17/2023 1026   MONOABS 0.2 05/17/2023 1026   EOSABS 0.0 05/17/2023 1026   BASOSABS 0.0 05/17/2023 1026   Recent Labs    08/17/22 0852 11/16/22 0817 02/16/23 1048 05/17/23 1026 05/18/23 0208  HGB 13.7 13.6 13.4 12.0 12.7    CMP     Component Value Date/Time   NA 138 05/18/2023 0208   K 3.9 05/18/2023 0208   CL 108 05/18/2023 0208   CO2 19 (L) 05/18/2023 0208   GLUCOSE 96 05/18/2023 0208   BUN 18 05/18/2023 0208   CREATININE 0.94 05/18/2023 0208   CREATININE 0.88 05/17/2023 1026   CALCIUM  9.0 05/18/2023 0208   PROT 6.6 05/18/2023 0208   ALBUMIN 3.7 05/18/2023 0208   AST 21 05/18/2023 0208   AST 18 05/17/2023 1026   ALT 16 05/18/2023 0208   ALT 12 05/17/2023 1026   ALKPHOS 26 (L) 05/18/2023 0208   BILITOT 0.8 05/18/2023 0208   BILITOT 0.4 05/17/2023 1026   GFRNONAA >60 05/18/2023 0208   GFRNONAA >60 05/17/2023 1026      Latest Ref Rng & Units 05/18/2023    2:08 AM 05/17/2023   10:26 AM 02/16/2023   10:48 AM  Hepatic Function  Total Protein 6.5 - 8.1 g/dL 6.6  7.0  7.6   Albumin 3.5 -  5.0 g/dL 3.7  4.3  4.6   AST 15 - 41 U/L 21  18  19    ALT 0 - 44 U/L 16  12  13    Alk Phosphatase 38 - 126 U/L 26  27  35   Total Bilirubin 0.0 - 1.2 mg/dL 0.8  0.4  0.4       Current Medications:     Current Outpatient Medications (Respiratory):    loratadine (CLARITIN) 10 MG tablet, Take 10 mg by mouth daily.  Current Outpatient Medications (Analgesics):    acetaminophen  (TYLENOL ) 500 MG tablet, Take 500-1,000 mg by mouth every 6 (six) hours as needed for moderate pain or headache.   Current Outpatient Medications (Other):    Biotin 1 MG CAPS, Take by mouth.   Cholecalciferol (DIALYVITE VITAMIN D 5000) 125 MCG (5000 UT) capsule, Take 5,000 Units by mouth daily. Vit D and K supplement combined   diazepam  (VALIUM ) 5 MG tablet, Place 1 tablet vaginally nightly as needed for muscle spasm/ pelvic pain.    estradiol  (ESTRACE ) 0.1 MG/GM vaginal cream, PLACE 0.5G NIGHTLY FOR TWO WEEKS THEN TWICE A WEEK AFTER   gabapentin  (NEURONTIN ) 100 MG capsule, Take 1 capsule (100 mg total) by mouth 3 (three) times daily.   hydrOXYzine  (ATARAX ) 10 MG tablet, TAKE 1 TABLET (10 MG TOTAL) BY MOUTH DAILY AS NEEDED FOR ANXIETY, MAY TAKE 2 TIMES A DAY IF NEEDED   loperamide  (IMODIUM ) 2 MG capsule, Take 1 capsule (2 mg total) by mouth 4 (four) times daily as needed for diarrhea or loose stools.   omeprazole  (PRILOSEC) 20 MG capsule, TAKE 1 CAPSULE BY MOUTH EVERY DAY   ondansetron  (ZOFRAN -ODT) 4 MG disintegrating tablet, Take 1 tablet (4 mg total) by mouth every 8 (eight) hours as needed for nausea or vomiting.   tamoxifen  (NOLVADEX ) 20 MG tablet, TAKE 1 TABLET BY MOUTH EVERY DAY   tretinoin  (RETIN-A ) 0.025 % cream, Apply 1 application  topically at bedtime.   VERZENIO  150 MG tablet, TAKE 1 TABLET BY MOUTH 2 TIMES A DAY  Medical History:  Past Medical History:  Diagnosis Date   Anxiety    Axillary pain, right 01/18/2021   Will get right axillary ultrasound.   Breast cancer (HCC)    Breast pain, left 10/06/2020   Encounter for gynecological examination with Papanicolaou smear of cervix 02/10/2021   Encounter for screening fecal occult blood testing 02/10/2021   Family history of breast cancer 11/11/2020   Fibroid 10/27/2020   Has 1 x .9 x .6 cm subserosal fibroid   H/O left mastectomy    left mastectomy 2022   History of hiatal hernia    Left breast mass 10/06/2020   +breast cancer   LLQ pain 10/06/2020   PONV (postoperative nausea and vomiting)    Port-A-Cath in place 03/25/2021   Allergies:  Allergies  Allergen Reactions   Prochlorperazine  Other (See Comments)    Severe restlessness. Akathisia.     Surgical History:  She  has a past surgical history that includes Wisdom tooth extraction; Mastectomy w/ sentinel node biopsy (Left, 01/06/2021); Radioactive seed guided axillary sentinel lymph node (Left,  01/06/2021); Sentinel node biopsy (Left, 01/06/2021); Breast reconstruction with placement of tissue expander and flex hd (acellular hydrated dermis) (Left, 01/06/2021); Portacath placement (Right, 01/19/2021); Axillary lymph node dissection (Left, 07/07/2021); Port-a-cath removal (Right, 07/07/2021); and Removal of tissue expander and placement of implant (Left, 02/18/2022). Family History:  Her family history includes Alcohol abuse in her father; Angina in her mother; Autoimmune  disease in her sister; Bladder Cancer in her maternal uncle; Breast cancer in her maternal aunt, paternal aunt, and paternal grandmother; Breast cancer (age of onset: 81) in her sister; Cancer in her maternal uncle; Congestive Heart Failure in her father; Heart disease in her father; Irritable bowel syndrome in her sister; Lung cancer in her maternal grandfather; Raynaud syndrome in her sister; Stroke in her maternal grandmother.  REVIEW OF SYSTEMS  : All other systems reviewed and negative except where noted in the History of Present Illness.  PHYSICAL EXAM: BP 110/70 (BP Location: Right Arm, Patient Position: Sitting, Cuff Size: Normal)   Pulse 100   Ht 5\' 4"  (1.626 m) Comment: height measured without shoes  Wt 121 lb 2 oz (54.9 kg)   BMI 20.79 kg/m  Physical Exam   GENERAL APPEARANCE: Well nourished, in no apparent distress. HEENT: No cervical lymphadenopathy, unremarkable thyroid, sclerae anicteric, conjunctiva pink. RESPIRATORY: Respiratory effort normal, breath sounds equal bilaterally without rales, rhonchi, or wheezing. CARDIO: Regular rate and rhythm with no murmurs, rubs, or gallops, peripheral pulses intact. ABDOMEN: Soft, non-distended, active bowel sounds in all four quadrants, mild epigastric pressure, no tenderness to palpation, no rebound, no masses appreciated, no pain with abdominal muscle engagement. RECTAL: Declines. MUSCULOSKELETAL: Full range of motion, normal gait, without edema. SKIN: Dry, intact  without rashes or lesions. No jaundice. NEURO: Alert, oriented, no focal deficits. PSYCH: Cooperative, normal mood and affect.      Edmonia Gottron, PA-C 10:25 AM

## 2023-06-07 ENCOUNTER — Encounter: Payer: Self-pay | Admitting: Physician Assistant

## 2023-06-07 ENCOUNTER — Other Ambulatory Visit: Payer: Self-pay | Admitting: Hematology

## 2023-06-07 ENCOUNTER — Ambulatory Visit: Admitting: Physician Assistant

## 2023-06-07 VITALS — BP 110/70 | HR 100 | Ht 64.0 in | Wt 121.1 lb

## 2023-06-07 DIAGNOSIS — K219 Gastro-esophageal reflux disease without esophagitis: Secondary | ICD-10-CM

## 2023-06-07 DIAGNOSIS — R1012 Left upper quadrant pain: Secondary | ICD-10-CM | POA: Diagnosis not present

## 2023-06-07 DIAGNOSIS — R197 Diarrhea, unspecified: Secondary | ICD-10-CM | POA: Diagnosis not present

## 2023-06-07 DIAGNOSIS — R110 Nausea: Secondary | ICD-10-CM

## 2023-06-07 DIAGNOSIS — C773 Secondary and unspecified malignant neoplasm of axilla and upper limb lymph nodes: Secondary | ICD-10-CM

## 2023-06-07 DIAGNOSIS — C50412 Malignant neoplasm of upper-outer quadrant of left female breast: Secondary | ICD-10-CM

## 2023-06-07 DIAGNOSIS — C50912 Malignant neoplasm of unspecified site of left female breast: Secondary | ICD-10-CM

## 2023-06-07 NOTE — Patient Instructions (Signed)
  _______________________________________________________  If your blood pressure at your visit was 140/90 or greater, please contact your primary care physician to follow up on this.  _______________________________________________________  If you are age 44 or older, your body mass index should be between 23-30. Your Body mass index is 20.79 kg/m. If this is out of the aforementioned range listed, please consider follow up with your Primary Care Provider.  If you are age 83 or younger, your body mass index should be between 19-25. Your Body mass index is 20.79 kg/m. If this is out of the aformentioned range listed, please consider follow up with your Primary Care Provider.   ________________________________________________________  The [facility] providers would like to encourage you to use MYCHART to communicate with providers for non-urgent requests or questions.  Due to long hold times on the telephone, sending your provider a message by [facility] may be a faster and more efficient way to get a response.  Please allow 48 business hours for a response.  Please remember that this is for non-urgent requests.  _______________________________________________________  Elene Griffes have been scheduled for an abdominal ultrasound at [facility] (1st floor of hospital) on 06-12-23 at 730am. Please arrive 15 minutes prior to your appointment for registration. Make certain not to have anything to eat or drink midnight prior to your appointment. Should you need to reschedule your appointment, please contact radiology at [phone]. This test typically takes about 30 minutes to perform.  You have been scheduled for an endoscopy. Please follow written instructions given to you at your visit today.  If you use inhalers (even only as needed), please bring them with you on the day of your procedure.  If you take any of the following medications, they will need to be adjusted prior to your procedure:    DO NOT TAKE 7 DAYS PRIOR TO TEST- Trulicity (dulaglutide) Ozempic, Wegovy (semaglutide) Mounjaro (tirzepatide) Bydureon Bcise (exanatide extended release)  DO NOT TAKE 1 DAY PRIOR TO YOUR TEST Rybelsus (semaglutide) Adlyxin (lixisenatide) Victoza (liraglutide) Byetta (exanatide) ___________________________________________________________________________  VISIT SUMMARY:  You came in today for ongoing left upper abdominal pain that has been worsening over the past few months. We discussed your history of breast cancer and the side effects of your current medication, Verzenio , which include nausea and diarrhea. We reviewed your previous imaging results and discussed the next steps for managing your symptoms.  YOUR PLAN:  -HIATAL HERNIA: A hiatal hernia occurs when part of the stomach pushes up through the diaphragm. This can cause symptoms like abdominal pain and reflux. We will order an endoscopy to check for ulcers, inflammation, or infection, and repeat an abdominal ultrasound to assess your spleen and rule out other issues. If these tests are normal, we may consider musculoskeletal causes for your pain.  -NAUSEA AND DIARRHEA DUE TO VERZENIO : Nausea and diarrhea are common side effects of Verzenio , a medication used to prevent the recurrence of breast cancer. Continue taking Prilosec daily for nausea and use Zofran  as needed for severe nausea. For diarrhea, use Imodium  as needed but avoid overuse to prevent constipation.  -BREAST CANCER: Your breast cancer has been treated with surgery, chemotherapy, and radiation. You are currently taking Verzenio  to prevent recurrence. Continue with your regular follow-ups with your oncology team.  INSTRUCTIONS:  Please schedule an endoscopy and a repeat abdominal ultrasound as soon as possible. Continue taking your medications as directed and follow up with your oncology team for regular breast cancer monitoring.

## 2023-06-12 ENCOUNTER — Other Ambulatory Visit: Payer: Self-pay

## 2023-06-12 ENCOUNTER — Ambulatory Visit (HOSPITAL_COMMUNITY)
Admission: RE | Admit: 2023-06-12 | Discharge: 2023-06-12 | Disposition: A | Discharge status: Home / Self Care | Source: Ambulatory Visit | Attending: Physician Assistant | Admitting: Physician Assistant

## 2023-06-12 DIAGNOSIS — R1012 Left upper quadrant pain: Secondary | ICD-10-CM | POA: Insufficient documentation

## 2023-06-12 MED ORDER — ABEMACICLIB 150 MG PO TABS
150.0000 mg | ORAL_TABLET | Freq: Two times a day (BID) | ORAL | 3 refills | Status: DC
Start: 2023-06-12 — End: 2023-07-12

## 2023-06-21 ENCOUNTER — Encounter: Payer: Self-pay | Admitting: Pediatrics

## 2023-06-27 NOTE — Progress Notes (Signed)
 [facility] History and Physical   Primary Care Physician:  Versa Gore, NP   Reason for Procedure:  Left upper abdominal pain, nausea, diarrhea  Plan:    Upper endoscopy   HPI: Pamela Reyes is a 42 y.o. female undergoing upper endoscopy for evaluation of left upper abdominal pain, nausea and diarrhea.  Patient reports a history of chronic pain in the left upper abdomen.  Imaging studies have been normal.  A small hiatal hernia was noted during cross-sectional imaging.  There has been some suspicion that her pain may be musculoskeletal in nature.  Patient thinks nausea and diarrhea may be related to Verzenio  being used to treat history of breast cancer.   Past Medical History:  Diagnosis Date   Allergy    Anxiety    Axillary pain, right 01/18/2021   Will get right axillary ultrasound.   Breast cancer (HCC)    Breast pain, left 10/06/2020   Encounter for gynecological examination with Papanicolaou smear of cervix 02/10/2021   Encounter for screening fecal occult blood testing 02/10/2021   Family history of breast cancer 11/11/2020   Fibroid 10/27/2020   Has 1 x .9 x .6 cm subserosal fibroid   H/O left mastectomy    left mastectomy 2022   History of hiatal hernia    Left breast mass 10/06/2020   +breast cancer   LLQ pain 10/06/2020   PONV (postoperative nausea and vomiting)    Port-A-Cath in place 03/25/2021    Past Surgical History:  Procedure Laterality Date   AXILLARY LYMPH NODE DISSECTION Left 07/07/2021   Procedure: LEFT AXILLARY LYMPH NODE DISSECTION;  Surgeon: Lockie Rima, MD;  Location: [facility];  Service: General;  Laterality: Left;   BREAST RECONSTRUCTION WITH PLACEMENT OF TISSUE EXPANDER AND FLEX HD (ACELLULAR HYDRATED DERMIS) Left 01/06/2021   Procedure: LEFT BREAST RECONSTRUCTION WITH PLACEMENT OF TISSUE EXPANDER AND ACELLULAR  DERMIS;  Surgeon: Alger Infield, MD;  Location: [facility];  Service: Plastics;  Laterality: Left;    MASTECTOMY W/ SENTINEL NODE BIOPSY Left 01/06/2021   Procedure: LEFT MASTECTOMY WITH MAGTRACE;  Surgeon: Lockie Rima, MD;  Location: [facility];  Service: General;  Laterality: Left;   PORT-A-CATH REMOVAL Right 07/07/2021   Procedure: REMOVAL PORT-A-CATH;  Surgeon: Lockie Rima, MD;  Location: [facility];  Service: General;  Laterality: Right;   PORTACATH PLACEMENT Right 01/19/2021   Procedure: INSERTION PORT-A-CATH;  Surgeon: Lockie Rima, MD;  Location: [facility];  Service: General;  Laterality: Right;   RADIOACTIVE SEED GUIDED AXILLARY SENTINEL LYMPH NODE Left 01/06/2021   Procedure: RADIOACTIVE SEED GUIDED SENTINEL LYMPH NODE BIOPSY;  Surgeon: Lockie Rima, MD;  Location: [facility];  Service: General;  Laterality: Left;   REMOVAL OF TISSUE EXPANDER AND PLACEMENT OF IMPLANT Left 02/18/2022   Procedure: REMOVAL OF TISSUE EXPANDER AND PLACEMENT OF IMPLANT;  Surgeon: Alger Infield, MD;  Location: [facility];  Service: Plastics;  Laterality: Left;   SENTINEL NODE BIOPSY Left 01/06/2021   Procedure: LEFT AXILLARY SENTINEL LYMPH  NODE BIOPSY;  Surgeon: Lockie Rima, MD;  Location: [facility];  Service: General;  Laterality: Left;   WISDOM TOOTH EXTRACTION      Prior to Admission medications   Medication Sig Start Date End Date Taking? Authorizing Provider  abemaciclib  (VERZENIO ) 150 MG tablet Take 1 tablet (150 mg total) by mouth 2 (two) times daily. 06/12/23   Sonja Parlier, MD  acetaminophen  (TYLENOL ) 500 MG tablet Take 500-1,000 mg by mouth every 6 (six) hours  as needed for moderate pain or headache.    [provider]  Biotin 1 MG CAPS Take by mouth.    [provider]  Cholecalciferol (DIALYVITE VITAMIN D 5000) 125 MCG (5000 UT) capsule Take 5,000 Units by mouth daily. Vit D and K supplement combined    [provider]  diazepam  (VALIUM ) 5 MG tablet Place 1 tablet vaginally nightly as needed for muscle spasm/ pelvic pain. 03/25/22   Arma Lamp, MD  estradiol  (ESTRACE ) 0.1 MG/GM vaginal cream PLACE 0.5G NIGHTLY FOR TWO WEEKS THEN TWICE A WEEK AFTER 06/26/22   Zuleta, Kaitlin G, NP  gabapentin  (NEURONTIN ) 100 MG capsule Take 1 capsule (100 mg total) by mouth 3 (three) times daily. 09/12/22   Burton, Lacie K, NP  hydrOXYzine  (ATARAX ) 10 MG tablet TAKE 1 TABLET (10 MG TOTAL) BY MOUTH DAILY AS NEEDED FOR ANXIETY, MAY TAKE 2 TIMES A DAY IF NEEDED 03/23/22   Versa Gore, NP  loperamide  (IMODIUM ) 2 MG capsule Take 1 capsule (2 mg total) by mouth 4 (four) times daily as needed for diarrhea or loose stools. 05/18/23   Edson Graces, MD  loratadine (CLARITIN) 10 MG tablet Take 10 mg by mouth daily.    [provider]  omeprazole  (PRILOSEC) 20 MG capsule TAKE 1 CAPSULE BY MOUTH EVERY DAY 12/23/22   Sonja Equality, MD  ondansetron  (ZOFRAN -ODT) 4 MG disintegrating tablet Take 1 tablet (4 mg total) by mouth every 8 (eight) hours as needed for nausea or vomiting. 05/18/23   Edson Graces, MD  tamoxifen  (NOLVADEX ) 20 MG tablet TAKE 1 TABLET BY MOUTH EVERY DAY 12/24/22   Sonja Slater, MD  tretinoin  (RETIN-A ) 0.025 % cream Apply 1 application  topically at bedtime. 04/06/21   [provider]  Investigational olanzapine /placebo 10 MG capsule URCC 16070 Blister Card 2 Take 1 capsule by mouth daily. Take in the morning on Days 2-4. 02/25/21 03/01/21  Sonja Landrum, MD    Current Outpatient Medications  Medication Sig Dispense Refill   abemaciclib  (VERZENIO ) 150 MG tablet Take 1 tablet (150 mg total) by mouth 2 (two) times daily. 60 tablet 3   Biotin 1 MG CAPS Take by mouth.     Cholecalciferol (DIALYVITE VITAMIN D 5000) 125 MCG (5000 UT) capsule Take 5,000 Units by mouth daily. Vit D and K supplement combined     estradiol  (ESTRACE ) 0.1 MG/GM vaginal cream PLACE 0.5G NIGHTLY FOR TWO WEEKS THEN TWICE A WEEK AFTER 126 g 4   loratadine (CLARITIN) 10 MG tablet Take 10 mg by mouth daily.     omeprazole  (PRILOSEC) 20 MG capsule TAKE 1 CAPSULE  BY MOUTH EVERY DAY 90 capsule 1   tamoxifen  (NOLVADEX ) 20 MG tablet TAKE 1 TABLET BY MOUTH EVERY DAY 90 tablet 1   acetaminophen  (TYLENOL ) 500 MG tablet Take 500-1,000 mg by mouth every 6 (six) hours as needed for moderate pain or headache.     diazepam  (VALIUM ) 5 MG tablet Place 1 tablet vaginally nightly as needed for muscle spasm/ pelvic pain. 30 tablet 0   gabapentin  (NEURONTIN ) 100 MG capsule Take 1 capsule (100 mg total) by mouth 3 (three) times daily. 90 capsule 2   hydrOXYzine  (ATARAX ) 10 MG tablet TAKE 1 TABLET (10 MG TOTAL) BY MOUTH DAILY AS NEEDED FOR ANXIETY, MAY TAKE 2 TIMES A DAY IF NEEDED 180 tablet 1   loperamide  (IMODIUM ) 2 MG capsule Take 1 capsule (2 mg total) by mouth 4 (four) times daily as needed for diarrhea or  loose stools. 12 capsule 0   ondansetron  (ZOFRAN -ODT) 4 MG disintegrating tablet Take 1 tablet (4 mg total) by mouth every 8 (eight) hours as needed for nausea or vomiting. 20 tablet 0   tretinoin  (RETIN-A ) 0.025 % cream Apply 1 application  topically at bedtime.     Current Facility-Administered Medications  Medication Dose Route Frequency Provider Last Rate Last Admin   0.9 %  sodium chloride  infusion  500 mL Intravenous Continuous Kenard Morawski, Scarlette Currier, MD        Allergies as of 06/29/2023 - Review Complete 06/29/2023  Allergen Reaction Noted   Prochlorperazine  Other (See Comments) 02/13/2021    Family History  Problem Relation Age of Onset   Angina Mother    Congestive Heart Failure Father    Alcohol abuse Father    Heart disease Father    Autoimmune disease Sister    Raynaud syndrome Sister    Irritable bowel syndrome Sister        constipation   Breast cancer Sister 16       ER+   Breast cancer Maternal Aunt    Bladder Cancer Maternal Uncle        smoking hx   Cancer Maternal Uncle        kidney or prostate; dx after 50   Breast cancer Paternal Aunt        dx after 76; x2 paternal aunts   Stroke Maternal Grandmother    Lung cancer Maternal  Grandfather        dx after 50; smoking hx   Breast cancer Paternal Grandmother        dx after 50   Colon cancer Neg Hx    Stomach cancer Neg Hx    Rectal cancer Neg Hx    Esophageal cancer Neg Hx     Social History   Socioeconomic History   Marital status: Significant Other    Spouse name: Not on file   Number of children: 0   Years of education: Not on file   Highest education level: Bachelor's degree (e.g., BA, AB, BS)  Occupational History   Not on file  Tobacco Use   Smoking status: Former    Current packs/day: 0.00    Average packs/day: 0.3 packs/day for 4.0 years (1.0 ttl pk-yrs)    Types: Cigarettes    Start date: 10/13/1997    Quit date: 10/13/2001    Years since quitting: 21.7   Smokeless tobacco: Never   Tobacco comments:    I was a light smoker in my teens  Vaping Use   Vaping status: Never Used  Substance and Sexual Activity   Alcohol use: Not Currently    Alcohol/week: 8.0 standard drinks of alcohol    Types: 8 Shots of liquor per week    Comment: very rarely   Drug use: Never   Sexual activity: Yes    Birth control/protection: Other-see comments    Comment: period stopped with chemo  Other Topics Concern   Not on file  Social History Narrative   Not on file   Social Drivers of Health   Financial Resource Strain: Low Risk  (04/10/2023)   Overall Financial Resource Strain (CARDIA)    Difficulty of Paying Living Expenses: Not hard at all  Food Insecurity: No Food Insecurity (04/10/2023)   Hunger Vital Sign    Worried About Running Out of Food in the Last Year: Never true    Ran Out of Food in the Last Year: Never true  Transportation Needs: No Transportation Needs (04/10/2023)   PRAPARE - Administrator, Civil Service (Medical): No    Lack of Transportation (Non-Medical): No  Physical Activity: Insufficiently Active (04/10/2023)   Exercise Vital Sign    Days of Exercise per Week: 4 days    Minutes of Exercise per Session: 20 min  Stress:  Stress Concern Present (04/10/2023)   [employer] of Occupational Health - Occupational Stress Questionnaire    Feeling of Stress : To some extent  Social Connections: Socially Isolated (04/10/2023)   Social Connection and Isolation Panel [NHANES]    Frequency of Communication with Friends and Family: Once a week    Frequency of Social Gatherings with Friends and Family: Once a week    Attends Religious Services: Never    Database administrator or Organizations: No    Attends Engineer, structural: Not on file    Marital Status: Married  Catering manager Violence: Not At Risk (06/02/2022)   Humiliation, Afraid, Rape, and Kick questionnaire    Fear of Current or Ex-Partner: No    Emotionally Abused: No    Physically Abused: No    Sexually Abused: No    Review of Systems:  All other review of systems negative except as mentioned in the HPI.  Physical Exam: Vital signs BP (!) 146/88   Pulse 87   Temp 98.2 F (36.8 C)   Resp 16   Ht 5\' 4"  (1.626 m)   Wt 121 lb (54.9 kg)   SpO2 100%   BMI 20.77 kg/m   General:   Alert,  Well-developed, well-nourished, pleasant and cooperative in NAD Airway:  Mallampati 2 Lungs:  Clear throughout to auscultation.   Heart:  Regular rate and rhythm; no murmurs, clicks, rubs,  or gallops. Abdomen:  Soft, nontender and nondistended. Normal bowel sounds.   Neuro/Psych:  Normal mood and affect. A and O x 3  Eugenia Hess, MD [facility]

## 2023-06-28 ENCOUNTER — Other Ambulatory Visit: Payer: Self-pay | Admitting: Hematology

## 2023-06-29 ENCOUNTER — Encounter: Payer: Self-pay | Admitting: Pediatrics

## 2023-06-29 ENCOUNTER — Ambulatory Visit (AMBULATORY_SURGERY_CENTER): Admitting: Pediatrics

## 2023-06-29 VITALS — BP 123/68 | HR 102 | Temp 98.2°F | Resp 14 | Ht 64.0 in | Wt 121.0 lb

## 2023-06-29 DIAGNOSIS — R110 Nausea: Secondary | ICD-10-CM

## 2023-06-29 DIAGNOSIS — R1012 Left upper quadrant pain: Secondary | ICD-10-CM

## 2023-06-29 DIAGNOSIS — K317 Polyp of stomach and duodenum: Secondary | ICD-10-CM | POA: Diagnosis not present

## 2023-06-29 DIAGNOSIS — R197 Diarrhea, unspecified: Secondary | ICD-10-CM

## 2023-06-29 DIAGNOSIS — K449 Diaphragmatic hernia without obstruction or gangrene: Secondary | ICD-10-CM | POA: Diagnosis present

## 2023-06-29 DIAGNOSIS — K3189 Other diseases of stomach and duodenum: Secondary | ICD-10-CM | POA: Diagnosis not present

## 2023-06-29 MED ORDER — SODIUM CHLORIDE 0.9 % IV SOLN: 500.0000 mL | INTRAVENOUS | Status: DC

## 2023-06-29 NOTE — Patient Instructions (Signed)
 Educational handout provided to patient related to HIATAL HERNIA  Resume previous diet  Continue present medications  Awaiting pathology results  YOU HAD AN ENDOSCOPIC PROCEDURE TODAY AT THE [facility]:   Refer to the procedure report that was given to you for any specific questions about what was found during the examination.  If the procedure report does not answer your questions, please call your gastroenterologist to clarify.  If you requested that your care partner not be given the details of your procedure findings, then the procedure report has been included in a sealed envelope for you to review at your convenience later.  YOU SHOULD EXPECT: Some feelings of bloating in the abdomen. Passage of more gas than usual.  Walking can help get rid of the air that was put into your GI tract during the procedure and reduce the bloating. If you had a lower endoscopy (such as a colonoscopy or flexible sigmoidoscopy) you may notice spotting of blood in your stool or on the toilet paper. If you underwent a bowel prep for your procedure, you may not have a normal bowel movement for a few days.  Please Note:  You might notice some irritation and congestion in your nose or some drainage.  This is from the oxygen used during your procedure.  There is no need for concern and it should clear up in a day or so.  SYMPTOMS TO REPORT IMMEDIATELY:  Following upper endoscopy (EGD)  Vomiting of blood or coffee ground material  New chest pain or pain under the shoulder blades  Painful or persistently difficult swallowing  New shortness of breath  Fever of 100F or higher  Black, tarry-looking stools  For urgent or emergent issues, a gastroenterologist can be reached at any hour by calling [phone]. Do not use MyChart messaging for urgent concerns.    DIET:  We do recommend a small meal at first, but then you may proceed to your regular diet.  Drink plenty of fluids but you should avoid  alcoholic beverages for 24 hours.  ACTIVITY:  You should plan to take it easy for the rest of today and you should NOT DRIVE or use heavy machinery until tomorrow (because of the sedation medicines used during the test).    FOLLOW UP: Our staff will call the number listed on your records the next business day following your procedure.  We will call around 7:15- 8:00 am to check on you and address any questions or concerns that you may have regarding the information given to you following your procedure. If we do not reach you, we will leave a message.     If any biopsies were taken you will be contacted by phone or by letter within the next 1-3 weeks.  Please call us at [phone] if you have not heard about the biopsies in 3 weeks.    SIGNATURES/CONFIDENTIALITY: You and/or your care partner have signed paperwork which will be entered into your electronic medical record.  These signatures attest to the fact that that the information above on your After Visit Summary has been reviewed and is understood.  Full responsibility of the confidentiality of this discharge information lies with you and/or your care-partner.

## 2023-06-29 NOTE — Progress Notes (Signed)
 Pt's states no medical or surgical changes since previsit or office visit.

## 2023-06-29 NOTE — Op Note (Signed)
 [facility] Patient Name: Pamela Reyes Procedure Date: 06/29/2023 10:44 AM MRN: [id number] Endoscopist: Eugenia Hess , MD, [id number] Age: 43 Referring MD:  Date of Birth: [birth year] Gender: Female Account #: [id number] Procedure:                Upper GI endoscopy Indications:              Abdominal pain in the left upper quadrant,                            Diarrhea, Nausea Medicines:                Monitored Anesthesia Care Procedure:                Pre-Anesthesia Assessment:                           - Prior to the procedure, a History and Physical                            was performed, and patient medications and                            allergies were reviewed. The patient's tolerance of                            previous anesthesia was also reviewed. The risks                            and benefits of the procedure and the sedation                            options and risks were discussed with the patient.                            All questions were answered, and informed consent                            was obtained. Prior Anticoagulants: The patient has                            taken no anticoagulant or antiplatelet agents. ASA                            Grade Assessment: II - A patient with mild systemic                            disease. After reviewing the risks and benefits,                            the patient was deemed in satisfactory condition to                            undergo the procedure.  After obtaining informed consent, the endoscope was                            passed under direct vision. Throughout the                            procedure, the patient's blood pressure, pulse, and                            oxygen saturations were monitored continuously. The                            GIF [id number] #[id number] was introduced through the                            mouth, and advanced to the second part of  duodenum.                            The upper GI endoscopy was accomplished without                            difficulty. The patient tolerated the procedure                            well. Scope In: Scope Out: Findings:                 The examined esophagus was normal.                           Multiple diminutive sessile polyps were found in                            the gastric fundus and in the gastric body.                            Biopsies were taken with a cold forceps for                            histology.                           The gastric body, gastric antrum, cardia (on                            retroflexion) and gastric fundus (on retroflexion)                            were normal. Biopsies were taken with a cold                            forceps for Helicobacter pylori testing.                           A small hiatal hernia was present.  The duodenal bulb and second portion of the                            duodenum were normal. Biopsies for histology were                            taken with a cold forceps for evaluation of celiac                            disease. Complications:            No immediate complications. Estimated blood loss:                            Minimal. Estimated Blood Loss:     Estimated blood loss was minimal. Impression:               - Normal esophagus.                           - Multiple gastric polyps. Biopsied.                           - Normal gastric body, antrum, cardia and gastric                            fundus. Biopsied.                           - Small hiatal hernia.                           - Normal duodenal bulb and second portion of the                            duodenum. Biopsied. Recommendation:           - Discharge patient to home (ambulatory).                           - Await pathology results.                           - The findings and recommendations were discussed                             with the patient's family.                           - Return to GI clinic in 2 months.                           - Patient has a contact number available for                            emergencies. The signs and symptoms of potential  delayed complications were discussed with the                            patient. Return to normal activities tomorrow.                            Written discharge instructions were provided to the                            patient. Eugenia Hess, MD 06/29/2023 11:05:06 AM This report has been signed electronically.

## 2023-06-29 NOTE — Progress Notes (Signed)
 Called to room to assist during endoscopic procedure.  Patient ID and intended procedure confirmed with present staff. Received instructions for my participation in the procedure from the performing physician.

## 2023-06-29 NOTE — Progress Notes (Signed)
 Vss nad trans to pacu

## 2023-06-30 ENCOUNTER — Telehealth: Payer: Self-pay

## 2023-06-30 NOTE — Telephone Encounter (Signed)
 No answer after follow up call. Voice message left.

## 2023-07-04 ENCOUNTER — Ambulatory Visit: Payer: Self-pay | Admitting: Pediatrics

## 2023-07-04 LAB — SURGICAL PATHOLOGY

## 2023-07-12 ENCOUNTER — Encounter: Payer: Self-pay | Admitting: Hematology

## 2023-07-12 ENCOUNTER — Other Ambulatory Visit: Payer: Self-pay | Admitting: Hematology

## 2023-07-12 MED ORDER — ABEMACICLIB 100 MG PO TABS: 100.0000 mg | ORAL_TABLET | Freq: Two times a day (BID) | ORAL | 0 refills | Status: DC

## 2023-07-13 ENCOUNTER — Telehealth: Payer: Self-pay | Admitting: Pharmacy Technician

## 2023-07-13 ENCOUNTER — Ambulatory Visit: Admitting: Adult Health

## 2023-07-13 ENCOUNTER — Encounter: Payer: Self-pay | Admitting: Adult Health

## 2023-07-13 VITALS — BP 110/72 | HR 71 | Ht 64.0 in | Wt 126.0 lb

## 2023-07-13 DIAGNOSIS — Z1211 Encounter for screening for malignant neoplasm of colon: Secondary | ICD-10-CM

## 2023-07-13 DIAGNOSIS — Z01419 Encounter for gynecological examination (general) (routine) without abnormal findings: Secondary | ICD-10-CM

## 2023-07-13 DIAGNOSIS — R102 Pelvic and perineal pain unspecified side: Secondary | ICD-10-CM | POA: Insufficient documentation

## 2023-07-13 DIAGNOSIS — Z853 Personal history of malignant neoplasm of breast: Secondary | ICD-10-CM | POA: Diagnosis not present

## 2023-07-13 DIAGNOSIS — Z1331 Encounter for screening for depression: Secondary | ICD-10-CM

## 2023-07-13 HISTORY — DX: Encounter for gynecological examination (general) (routine) without abnormal findings: Z01.419

## 2023-07-13 LAB — HEMOCCULT GUIAC POC 1CARD (OFFICE): Fecal Occult Blood, POC: NEGATIVE

## 2023-07-13 NOTE — Telephone Encounter (Signed)
 Oral Oncology Patient Advocate Encounter  Called [facility] specialty pharmacy to make sure the Verzenio  150mg  was d/c and they were only processing the Verzenio  100mg .

## 2023-07-13 NOTE — Progress Notes (Signed)
 Patient ID: Pamela Reyes, female   DOB: 19-May-1980, 43 y.o.   MRN: [id number] History of Present Illness: Pamela Reyes is a 43 year old white female, with SO, G0P0, in for well woman gyn exam. She is on tamoxifen  and has had some cramping in last few months but no bleeding.     Component Value Date/Time   DIAGPAP  06/02/2022 1501    - Negative for intraepithelial lesion or malignancy (NILM)   DIAGPAP  02/10/2021 1438    - Negative for intraepithelial lesion or malignancy (NILM)   HPVHIGH Negative 06/02/2022 1501   HPVHIGH Negative 02/10/2021 1438   ADEQPAP  06/02/2022 1501    Satisfactory for evaluation; transformation zone component ABSENT.   ADEQPAP  02/10/2021 1438    Satisfactory for evaluation; transformation zone component PRESENT.   PCP is Versa Gore NP   Current Medications, Allergies, Past Medical History, Past Surgical History, Family History and Social History were reviewed in [employer] record.     Review of Systems: Patient denies any headaches, hearing loss, fatigue, blurred vision, shortness of breath, chest pain, abdominal pain, problems with bowel movements, urination, or intercourse. No joint pain or mood swings.  She is on tamoxifen  and has had some cramping in last few months but no bleeding. Hot flashes are better  Periods stopped after chemo   Physical Exam:BP 110/72 (BP Location: Right Arm, Patient Position: Sitting, Cuff Size: Normal)   Pulse 71   Ht 5\' 4"  (1.626 m)   Wt 126 lb (57.2 kg)   BMI 21.63 kg/m   General:  Well developed, well nourished, no acute distress Skin:  Warm and dry Neck:  Midline trachea, normal thyroid, good ROM, no lymphadenopathy Lungs; Clear to auscultation bilaterally Breast:  No dominant palpable mass, retraction, or nipple discharge on the right, left breast is surgically absent, has implant, no masses or tenderness Cardiovascular: Regular rate and rhythm Abdomen:  Soft, non tender, no  hepatosplenomegaly Pelvic:  External genitalia is normal in appearance, no lesions.  The vagina is pale pink. Urethra has no lesions or masses. The cervix is smooth.  Uterus is felt to be normal size, shape, and contour.  No adnexal masses or tenderness noted.Bladder is non tender, no masses felt. Rectal: Good sphincter tone, no polyps, or hemorrhoids felt.  Hemoccult negative. Extremities/musculoskeletal:  No swelling or varicosities noted, no clubbing or cyanosis Psych:  No mood changes, alert and cooperative,seems happy AA is 1 Falsl risk is low    07/13/2023    9:34 AM 04/12/2023    1:50 PM 06/02/2022    2:34 PM  Depression screen PHQ 2/9  Decreased Interest 0 0 1  Down, Depressed, Hopeless 1 0 1  PHQ - 2 Score 1 0 2  Altered sleeping 0  1  Tired, decreased energy 1  2  Change in appetite 0  0  Feeling bad or failure about yourself  0  0  Trouble concentrating 0  0  Moving slowly or fidgety/restless 0  0  Suicidal thoughts 0  0  PHQ-9 Score 2  5       07/13/2023    9:34 AM 06/02/2022    2:34 PM 02/10/2021    2:36 PM 10/06/2020    2:52 PM  GAD 7 : Generalized Anxiety Score  Nervous, Anxious, on Edge 1 2 1 1   Control/stop worrying 0 1 1 1   Worry too much - different things 1 1 1  0  Trouble relaxing 0 1 1  0  Restless 0 0 0 0  Easily annoyed or irritable 0 1 1 0  Afraid - awful might happen 0 0 1 1  Total GAD 7 Score 2 6 6 3     Upstream - 07/13/23 0931       Pregnancy Intention Screening   Does the patient want to become pregnant in the next year? No    Does the patient's partner want to become pregnant in the next year? No    Would the patient like to discuss contraceptive options today? No      Contraception Wrap Up   Current Method No Method - Other Reason    End Method No Method - Other Reason    Contraception Counseling Provided No              Examination chaperoned by Alphonso Aschoff LPN  Impression and Plan: 1. Encounter for well woman exam with routine  gynecological exam (Primary) Physical in 1 year Pap in 2027 Right breast mammogram was negative 03/08/23 Labs with PCP  2. Encounter for screening fecal occult blood testing Hemoccult was negative  3. History of breast cancer On tamoxifen , no routine US  unless bleeding, discussed with Dr Ozan   4. Pelvic cramping Has had cramping in last few months, no bleeding, if cramping changes, or any bleeding  let me know will get US 

## 2023-07-24 ENCOUNTER — Other Ambulatory Visit: Payer: Self-pay | Admitting: Hematology

## 2023-08-02 ENCOUNTER — Other Ambulatory Visit: Payer: Self-pay | Admitting: Adult Health

## 2023-08-02 DIAGNOSIS — R58 Hemorrhage, not elsewhere classified: Secondary | ICD-10-CM

## 2023-08-02 NOTE — Progress Notes (Signed)
 US  scheduled at [facility] for bleeding on tamoxifen  for July 1 at 10 am

## 2023-08-08 ENCOUNTER — Ambulatory Visit (HOSPITAL_COMMUNITY)
Admission: RE | Admit: 2023-08-08 | Discharge: 2023-08-08 | Disposition: A | Discharge status: Home / Self Care | Source: Ambulatory Visit | Attending: Adult Health | Admitting: Adult Health

## 2023-08-08 DIAGNOSIS — R58 Hemorrhage, not elsewhere classified: Secondary | ICD-10-CM | POA: Insufficient documentation

## 2023-08-15 ENCOUNTER — Other Ambulatory Visit: Payer: Self-pay

## 2023-08-15 ENCOUNTER — Other Ambulatory Visit: Payer: Self-pay | Admitting: Nurse Practitioner

## 2023-08-15 DIAGNOSIS — C50412 Malignant neoplasm of upper-outer quadrant of left female breast: Secondary | ICD-10-CM

## 2023-08-15 NOTE — Progress Notes (Signed)
 As per Dr. Lanny, order place in portal for Signatera Rick in lab called for kit last done in March) order confirmation received. Kit taken to lab to be drawn 08/16/2023.

## 2023-08-15 NOTE — Progress Notes (Unsigned)
 Patient Care Team: Lucius Krabbe, NP as PCP - General (Family Medicine) Glean Stephane BROCKS, RN (Inactive) as Oncology Nurse Navigator Tyree Nanetta SAILOR, RN as Oncology Nurse Navigator Aron Shoulders, MD as Consulting Physician (General Surgery) Lanny Callander, MD as Consulting Physician (Hematology) Dewey Rush, MD as Consulting Physician (Radiation Oncology) Signa Delon LABOR, NP as Nurse Practitioner (Obstetrics and Gynecology) Burton, Lacie K, NP as Nurse Practitioner (Nurse Practitioner)  Clinic Day:  08/16/2023  Referring physician: Lucius Krabbe, NP  ASSESSMENT & PLAN:   Assessment & Plan: Malignant neoplasm of upper-outer quadrant of left breast in female, 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 Biopsy on 11/05/20 confirmed invasive ductal carcinoma, with metastatic involvement of one lymph node. -staging CT CAP and bone scan on 11/19/20 showed no distant metastasis -she underwent left mastectomy on 01/06/21 under Dr. Aron. Pathology showed 6.5 cm invasive ductal carcinoma, grade 2, involving dermis of nipple without epidermal involvement. Margins were negative, but all 4 lymph nodes showed metastatic disease. -s/p 4 cycles AC 02/11/21 - 03/25/21 and Abraxane  04/08/21 - 06/24/21. -PET scan on 06/16/21 showed no convincing evidence of local recurrence or metastatic disease. -left ALND on 07/07/21 with Dr. Aron, path showed 1/11 positive nodes -s/p postmastectomy radiation by Dr. Dewey 08/16/21 - 10/04/21. -she started Zoladex  in 07/07/21 and letrozole  on 07/21/21. She developed worsening joint pain and was switched to exemestane  in early 10/2021. But due to worsening joint pain, urinary sx, and negative QOL we switched to Tamoxifen  (and stopped zoladex ) in 04/2022  -she started Verzenio  100mg  BID on 10/28/21, dose increased to 150mg  bid  later. She had UTI, fatigue etc, but overall tolerating better now -she had reconstructive surgery on February 18, 2022, Verzenio  was held before surgery. - Signatera test for circulating tumor DNA was negative on 04/11/2023. -Continue tamoxifen  20 mg daily and Verzenio  150 mg twice daily.    The patient understands the plans discussed today and is in agreement with them.  She knows to contact our office if she develops concerns prior to her next appointment.  I provided *** minutes of face-to-face time during this encounter and > 50% was spent counseling as documented under my assessment and plan.    Powell FORBES Lessen, NP  [facility] - A DEPT OF [facility] [address] [place] [address] Dept: [phone] Dept Fax: [phone]   No orders of the defined types were placed in this encounter.     CHIEF COMPLAINT:  CC: Left breast cancer, estrogen receptor positive  Current Treatment: Tamoxifen  20 mg daily, Verzenio  150 mg twice daily  INTERVAL HISTORY:  Pamela Reyes is here today for repeat clinical assessment. She was last seen by me on 05/17/2023. States that in mid-June she developed vaginal bleeding. Started off as spotting. This gradually progressed to full menstrual type bleeding. This lasted for 8 or 9 days. She saw her GYN provider. A pelvic ultrasound was performed showing mild endometrial hyperplasia. Vaginal bleeding is a common side effect of tamoxifen . Endometrial hyperplasia can be a serious side effect. She denies fevers or chills. She denies pain. Her appetite is good. Her weight has been stable.  I have reviewed the past medical history, past surgical history, social history and family history with the patient and they are unchanged from previous note.  ALLERGIES:  is allergic to  prochlorperazine .  MEDICATIONS:  Current Outpatient Medications  Medication Sig Dispense Refill   acetaminophen  (TYLENOL ) 500  MG tablet Take 500-1,000 mg by mouth every 6 (six) hours as needed for moderate pain or headache.     Biotin 1 MG CAPS Take by mouth.     Cholecalciferol (DIALYVITE VITAMIN D 5000) 125 MCG (5000 UT) capsule Take 5,000 Units by mouth daily. Vit D and K supplement combined     diazepam  (VALIUM ) 5 MG tablet Place 1 tablet vaginally nightly as needed for muscle spasm/ pelvic pain. 30 tablet 0   estradiol  (ESTRACE ) 0.1 MG/GM vaginal cream PLACE 0.5G NIGHTLY FOR TWO WEEKS THEN TWICE A WEEK AFTER 126 g 4   gabapentin  (NEURONTIN ) 100 MG capsule Take 1 capsule (100 mg total) by mouth 3 (three) times daily. 90 capsule 2   hydrOXYzine  (ATARAX ) 10 MG tablet TAKE 1 TABLET (10 MG TOTAL) BY MOUTH DAILY AS NEEDED FOR ANXIETY, MAY TAKE 2 TIMES A DAY IF NEEDED 180 tablet 1   loperamide  (IMODIUM ) 2 MG capsule Take 1 capsule (2 mg total) by mouth 4 (four) times daily as needed for diarrhea or loose stools. 12 capsule 0   loratadine (CLARITIN) 10 MG tablet Take 10 mg by mouth daily.     omeprazole  (PRILOSEC) 20 MG capsule TAKE 1 CAPSULE BY MOUTH EVERY DAY 90 capsule 1   ondansetron  (ZOFRAN -ODT) 4 MG disintegrating tablet Take 1 tablet (4 mg total) by mouth every 8 (eight) hours as needed for nausea or vomiting. 20 tablet 0   tamoxifen  (NOLVADEX ) 20 MG tablet TAKE 1 TABLET BY MOUTH EVERY DAY 90 tablet 1   VERZENIO  100 MG tablet TAKE 1 TABLET BY MOUTH 2 TIMES A DAY 56 tablet 0   No current facility-administered medications for this visit.    HISTORY OF PRESENT ILLNESS:   Oncology History Overview Note   Cancer Staging  Malignant neoplasm of upper-outer quadrant of left breast in female, estrogen receptor positive (HCC) Staging form: Breast, AJCC 8th Edition - Clinical stage from 11/05/2020: Stage IIA (cT2, cN1, cM0, G1, ER+, PR+, HER2: Equivocal) - Signed by Lanny Callander, MD on 11/10/2020 - Pathologic stage from 01/06/2021: Stage IB (pT3, pN2a, cM0, G2, ER+, PR+, HER2-) - Signed by Lanny Callander, MD on 01/12/2021      Malignant neoplasm of upper-outer quadrant of left breast in female, estrogen receptor positive (HCC)  10/28/2020 Mammogram   Bilateral Diagnostic Mammogram; Left Breast Ultrasound  IMPRESSION: 1. Suspicious palpable mass/hypoechoic area in the left breast at 12 o'clock retroareolar measuring 4.5 cm.   2.  Suspicious lymph node in the left axilla.   11/05/2020 Cancer Staging   Staging form: Breast, AJCC 8th Edition - Clinical stage from 11/05/2020: Stage IIA (cT2, cN1, cM0, G1, ER+, PR+, HER2: Equivocal) - Signed by Lanny Callander, MD on 11/10/2020 Stage prefix: Initial diagnosis Histologic grading system: 3 grade system   11/05/2020 Pathology Results   Diagnosis 1. Breast, left, needle core biopsy, 12 o'clock subareolar, ribbon clip - INVASIVE MAMMARY CARCINOMA. SEE NOTE 1. Carcinoma measures 1.5 cm in greatest linear dimension and appears grade 2. 1. Immunohistochemical stain for E-cadherin is positive in the tumor cells, consistent with a ductal phenotype. 1. PROGNOSTIC INDICATORS Results: The tumor cells are EQUIVOCAL for Her2 (2+). Her2 by FISH will be performed and results reported separately. Estrogen Receptor: 70%, POSITIVE, STRONG-MODERATE STAINING INTENSITY Progesterone Receptor: 80%, POSITIVE, MODERATE STAINING INTENSITY Proliferation Marker Ki67: 1%  2. Lymph node, needle/core biopsy, left axilla, tribell clip - METASTATIC  CARCINOMA TO A LYMPH NODE. SEE NOTE 2. Largest contiguous focus of metastatic carcinoma measures 0.3 cm. 2. PROGNOSTIC INDICATORS Results: The tumor cells are EQUIVOCAL for Her2 (2+). Her2 by FISH will be performed and results reported separately. Estrogen Receptor: 90%, POSITIVE, STRONG STAINING INTENSITY Progesterone Receptor: 95%, POSITIVE, STRONG STAINING INTENSITY Proliferation Marker Ki67: 1%   11/10/2020 Initial Diagnosis   Malignant neoplasm of upper-outer quadrant of left breast in female, estrogen receptor positive (HCC)   11/19/2020 Imaging    EXAM: CT CHEST, ABDOMEN, AND PELVIS WITH CONTRAST  IMPRESSION: 1. Left breast mass  as previously described. 2. Enhancing subcentimeter left axillary lymph nodes. 3. No evidence of distal metastatic disease. 4. Small hiatal hernia   11/19/2020 Imaging   EXAM: NUCLEAR MEDICINE WHOLE BODY BONE SCAN  IMPRESSION: No evidence of metastatic disease.   11/21/2020 Imaging   EXAM: BILATERAL BREAST MRI WITH AND WITHOUT CONTRAST  IMPRESSION: 1. Suspicious 1.3 centimeter mass in the anterior UPPER central RIGHT breast warranting tissue diagnosis. (Image 65 of series 7). 2. 4 millimeter possible satellite nodule posterior to the 1.3 centimeter mass in the RIGHT breast. (Image 63 of series 7). 3. Indeterminate oval mass in the UPPER central middle depth of the RIGHT breast warranting tissue diagnosis. (Image 60 of series 7). 4. Large area of enhancement in the central portion of the LEFT breast, involving all quadrants. There is associated enhancement and retraction of the LEFT nipple, with significantly smaller size of the LEFT breast. Enhancement spans at least 6.5 centimeters. 5. Enlarged, previously biopsied LEFT axillary lymph node.   11/24/2020 Genetic Testing   Negative hereditary cancer genetic testing: no pathogenic variants detected in [facility] BRCAPlus STAT Panel or [employer] CustomNext-Cancer +RNAinsight Panel.  The report dates are November 24, 2020 and November 27, 2020, respectively.   The BRCAplus panel offered by [employer] and includes sequencing and deletion/duplication analysis for the following 8 genes: ATM, BRCA1, BRCA2, CDH1, CHEK2, PALB2, PTEN, and TP53.  The CustomNext-Cancer+RNAinsight panel offered by [employer] includes sequencing and rearrangement analysis for the following 47 genes:  APC, ATM, AXIN2, BARD1, BMPR1A, BRCA1, BRCA2, BRIP1, CDH1, CDK4, CDKN2A, CHEK2, DICER1, EPCAM, GREM1, HOXB13, MEN1, MLH1, MSH2, MSH3, MSH6, MUTYH, NBN, NF1, NF2, NTHL1, PALB2, PMS2,  POLD1, POLE, PTEN, RAD51C, RAD51D, RECQL, RET, SDHA, SDHAF2, SDHB, SDHC, SDHD, SMAD4, SMARCA4, STK11, TP53, TSC1, TSC2, and VHL.  RNA data is routinely analyzed for use in variant interpretation for all genes.    11/27/2020 Pathology Results   Diagnosis Breast, right, needle core biopsy, anterior upper central - FIBROADENOMATOID AND FIBROCYSTIC CHANGES WITH CALCIFICATIONS - PSEUDOANGIOMATOUS STROMAL HYPERPLASIA - FOCAL PERIDUCTULAR CHRONIC INFLAMMATION - NO MALIGNANCY IDENTIFIED   01/06/2021 Cancer Staging   Staging form: Breast, AJCC 8th Edition - Pathologic stage from 01/06/2021: Stage IB (pT3, pN2a, cM0, G2, ER+, PR+, HER2-) - Signed by Lanny Callander, MD on 01/12/2021 Stage prefix: Initial diagnosis Histologic grading system: 3 grade system Residual tumor (R): R0 - None   01/06/2021 Definitive Surgery   FINAL MICROSCOPIC DIAGNOSIS:   A. LYMPH NODE, LEFT AXILLARY, SENTINEL, EXCISION:  - Metastatic carcinoma to a lymph node (1/1)  - Focus of metastatic carcinoma measures 1.5 cm without evidence of extranodal extension   B. 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 BREAST, LEFT INFERIOLATERAL MARGIN, EXCISION:  -  Benign fibroadipose tissue  - Negative for carcinoma   D. LYMPH NODE, LEFT AXILLARY #1, SENTINEL, EXCISION:  - Metastatic carcinoma to a lymph node (1/1)  - Focus of metastatic carcinoma measures 1.4 cm without evidence of extranodal extension   E. LYMPH NODE, LEFT AXILLARY #2, SENTINEL, EXCISION:  - Metastatic carcinoma to a lymph node (1/1)  - Focus of metastatic carcinoma measures 0.5 cm without evidence of extranodal extension   F. LYMPH NODE, LEFT AXILLARY #3, SENTINEL, EXCISION:  - Metastatic carcinoma to a lymph node (1/1)  - Focus of metastatic carcinoma measures 0.9 cm  without evidence of extranodal extension   G. LYMPH NODE, LEFT AXILLARY #4, SENTINEL, EXCISION:  - Benign fibroadipose tissue, negative for carcinoma  - Lymphoid tissue is not identified    02/11/2021 - 06/24/2021 Chemotherapy   Patient is on Treatment Plan : BREAST ADJUVANT DOSE DENSE AC q14d / PACLitaxel  q7d     06/16/2021 PET scan   IMPRESSION: 1. Postsurgical change of left mastectomy and left axillary lymph node dissection with tissue expander in place. Mild hypermetabolic activity about the surgical site without suspicious focal hypermetabolic soft tissue nodularity, favored to reflect postsurgical change.   2. No convincing scintigraphic evidence of hypermetabolic local recurrence or metastatic disease within the neck, chest, abdomen or pelvis.       REVIEW OF SYSTEMS:   Constitutional: Denies fevers, chills or abnormal weight loss Eyes: Denies blurriness of vision Ears, nose, mouth, throat, and face: Denies mucositis or sore throat Respiratory: Denies cough, dyspnea or wheezes Cardiovascular: Denies palpitation, chest discomfort or lower extremity swelling Gastrointestinal:  Denies nausea, heartburn or change in bowel habits Skin: Denies abnormal skin rashes Lymphatics: Denies new lymphadenopathy or easy bruising Neurological:Denies numbness, tingling or new weaknesses Behavioral/Psych: Mood is stable, no new changes  All other systems were reviewed with the patient and are negative.   VITALS:   Today's Vitals   08/16/23 0852  BP: 110/60  Pulse: 94  Resp: 17  Temp: 97.6 F (36.4 C)  SpO2: 99%  Weight: 126 lb 12.8 oz (57.5 kg)   Body mass index is 21.77 kg/m.    Wt Readings from Last 3 Encounters:  08/16/23 126 lb 12.8 oz (57.5 kg)  07/13/23 126 lb (57.2 kg)  06/29/23 121 lb (54.9 kg)    Body mass index is 21.77 kg/m.  Performance status (ECOG): 1 - Symptomatic but completely ambulatory  PHYSICAL EXAM:   GENERAL:alert, no distress and  comfortable SKIN: skin color, texture, turgor are normal, no rashes or significant lesions EYES: normal, Conjunctiva are pink and non-injected, sclera clear OROPHARYNX:no exudate, no erythema and lips, buccal mucosa, and tongue normal  NECK: supple, thyroid normal size, non-tender, without nodularity LYMPH:  no palpable lymphadenopathy in the cervical, axillary or inguinal LUNGS: clear to auscultation and percussion with normal breathing effort HEART: regular rate & rhythm and no murmurs and no lower extremity edema ABDOMEN:abdomen soft, non-tender and normal bowel sounds Musculoskeletal:no cyanosis of digits and no clubbing  NEURO: alert & oriented x 3 with fluent speech, no focal motor/sensory deficits  LABORATORY DATA:  I have reviewed the data as listed    Component Value Date/Time   NA 138 05/18/2023 0208   K 3.9 05/18/2023 0208   CL 108 05/18/2023 0208   CO2 19 (L) 05/18/2023 0208   GLUCOSE 96 05/18/2023 0208   BUN 18 05/18/2023 0208   CREATININE 0.94 05/18/2023 0208   CREATININE 0.88 05/17/2023 1026   CALCIUM  9.0 05/18/2023 0208  PROT 6.6 05/18/2023 0208   ALBUMIN 3.7 05/18/2023 0208   AST 21 05/18/2023 0208   AST 18 05/17/2023 1026   ALT 16 05/18/2023 0208   ALT 12 05/17/2023 1026   ALKPHOS 26 (L) 05/18/2023 0208   BILITOT 0.8 05/18/2023 0208   BILITOT 0.4 05/17/2023 1026   GFRNONAA >60 05/18/2023 0208   GFRNONAA >60 05/17/2023 1026    No results found for: SPEP, UPEP  Lab Results  Component Value Date   WBC 3.0 (L) 08/16/2023   NEUTROABS 1.5 (L) 08/16/2023   HGB 12.4 08/16/2023   HCT 35.7 (L) 08/16/2023   MCV 95.2 08/16/2023   PLT 210 08/16/2023      Chemistry      Component Value Date/Time   NA 138 05/18/2023 0208   K 3.9 05/18/2023 0208   CL 108 05/18/2023 0208   CO2 19 (L) 05/18/2023 0208   BUN 18 05/18/2023 0208   CREATININE 0.94 05/18/2023 0208   CREATININE 0.88 05/17/2023 1026      Component Value Date/Time   CALCIUM  9.0 05/18/2023  0208   ALKPHOS 26 (L) 05/18/2023 0208   AST 21 05/18/2023 0208   AST 18 05/17/2023 1026   ALT 16 05/18/2023 0208   ALT 12 05/17/2023 1026   BILITOT 0.8 05/18/2023 0208   BILITOT 0.4 05/17/2023 1026       RADIOGRAPHIC STUDIES: I have personally reviewed the radiological images as listed and agreed with the findings in the report. US  PELVIC COMPLETE WITH TRANSVAGINAL Result Date: 08/12/2023 CLINICAL DATA:  bleeding and cramping on tamoxifen  EXAM: TRANSABDOMINAL AND TRANSVAGINAL ULTRASOUND OF PELVIS TECHNIQUE: Both transabdominal and transvaginal ultrasound examinations of the pelvis were performed. Transabdominal technique was performed for global imaging of the pelvis including uterus, ovaries, adnexal regions, and pelvic cul-de-sac. It was necessary to proceed with endovaginal exam following the transabdominal exam to visualize the uterus, endometrium, and ovaries. COMPARISON:  September 02, 2022, October 21, 2020 FINDINGS: Uterus Measurements: 8.5 x 4.4 x 5.3 cm = volume: 103 mL. No fibroids or other mass visualized. Endometrium Appears somewhat thickened in the upper aspect measuring 17 mm thick. No focal abnormality visualized. Right ovary Measurements: 2.3 x 1.9 x 2.3 cm = volume: 5.1 mL. Normal appearance/no adnexal mass. Left ovary Measurements: 2.5 x 1.5 x 2.3 cm = volume: 4.4 mL. Normal appearance/no adnexal mass. Other findings Small volume free fluid in the pelvis, likely physiologic in a female of this age. IMPRESSION: The endometrium appears somewhat thickened in the upper portion measuring 17 mm thick. A repeat pelvic ultrasound in 6-12 weeks during a different phase of the menstrual cycle is recommended to document resolution. Alternatively, a nonemergent pelvic MRI with IV contrast could be considered. Electronically Signed   By: Rogelia Myers M.D.   On: 08/12/2023 13:09

## 2023-08-15 NOTE — Assessment & Plan Note (Signed)
 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 Biopsy on 11/05/20 confirmed invasive ductal carcinoma, with metastatic involvement of one lymph node. -staging CT CAP and bone scan on 11/19/20 showed no distant metastasis -she underwent left mastectomy on 01/06/21 under Dr. Aron. Pathology showed 6.5 cm invasive ductal carcinoma, grade 2, involving dermis of nipple without epidermal involvement. Margins were negative, but all 4 lymph nodes showed metastatic disease. -s/p 4 cycles AC 02/11/21 - 03/25/21 and Abraxane  04/08/21 - 06/24/21. -PET scan on 06/16/21 showed no convincing evidence of local recurrence or metastatic disease. -left ALND on 07/07/21 with Dr. Aron, path showed 1/11 positive nodes -s/p postmastectomy radiation by Dr. Dewey 08/16/21 - 10/04/21. -she started Zoladex  in 07/07/21 and letrozole  on 07/21/21. She developed worsening joint pain and was switched to exemestane  in early 10/2021. But due to worsening joint pain, urinary sx, and negative QOL we switched to Tamoxifen  (and stopped zoladex ) in 04/2022  -she started Verzenio  100mg  BID on 10/28/21, dose increased to 150mg  bid later. She had UTI, fatigue etc, but overall tolerating better now -she had reconstructive surgery on February 18, 2022, Verzenio  was held before surgery. - Signatera test for circulating tumor DNA was negative on 04/11/2023. -Continue tamoxifen  20 mg daily and Verzenio  150 mg twice daily.

## 2023-08-16 ENCOUNTER — Inpatient Hospital Stay: Admitting: Nurse Practitioner

## 2023-08-16 ENCOUNTER — Inpatient Hospital Stay: Attending: Hematology

## 2023-08-16 ENCOUNTER — Ambulatory Visit: Payer: Self-pay | Admitting: Adult Health

## 2023-08-16 VITALS — BP 110/60 | HR 94 | Temp 97.6°F | Resp 17 | Wt 126.8 lb

## 2023-08-16 DIAGNOSIS — N939 Abnormal uterine and vaginal bleeding, unspecified: Secondary | ICD-10-CM | POA: Diagnosis not present

## 2023-08-16 DIAGNOSIS — Z9221 Personal history of antineoplastic chemotherapy: Secondary | ICD-10-CM | POA: Diagnosis not present

## 2023-08-16 DIAGNOSIS — Z170 Estrogen receptor positive status [ER+]: Secondary | ICD-10-CM | POA: Diagnosis not present

## 2023-08-16 DIAGNOSIS — Z79810 Long term (current) use of selective estrogen receptor modulators (SERMs): Secondary | ICD-10-CM | POA: Insufficient documentation

## 2023-08-16 DIAGNOSIS — Z1732 Human epidermal growth factor receptor 2 negative status: Secondary | ICD-10-CM | POA: Insufficient documentation

## 2023-08-16 DIAGNOSIS — C50412 Malignant neoplasm of upper-outer quadrant of left female breast: Secondary | ICD-10-CM | POA: Diagnosis not present

## 2023-08-16 DIAGNOSIS — Z1721 Progesterone receptor positive status: Secondary | ICD-10-CM | POA: Insufficient documentation

## 2023-08-16 DIAGNOSIS — D709 Neutropenia, unspecified: Secondary | ICD-10-CM | POA: Insufficient documentation

## 2023-08-16 DIAGNOSIS — Z9012 Acquired absence of left breast and nipple: Secondary | ICD-10-CM | POA: Insufficient documentation

## 2023-08-16 DIAGNOSIS — R9389 Abnormal findings on diagnostic imaging of other specified body structures: Secondary | ICD-10-CM | POA: Insufficient documentation

## 2023-08-16 DIAGNOSIS — Z923 Personal history of irradiation: Secondary | ICD-10-CM | POA: Diagnosis not present

## 2023-08-16 LAB — CBC WITH DIFFERENTIAL (CANCER CENTER ONLY)
Abs Immature Granulocytes: 0.01 K/uL (ref 0.00–0.07)
Basophils Absolute: 0 K/uL (ref 0.0–0.1)
Basophils Relative: 1 %
Eosinophils Absolute: 0 K/uL (ref 0.0–0.5)
Eosinophils Relative: 1 %
HCT: 35.7 % — ABNORMAL LOW (ref 36.0–46.0)
Hemoglobin: 12.4 g/dL (ref 12.0–15.0)
Immature Granulocytes: 0 %
Lymphocytes Relative: 41 %
Lymphs Abs: 1.2 K/uL (ref 0.7–4.0)
MCH: 33.1 pg (ref 26.0–34.0)
MCHC: 34.7 g/dL (ref 30.0–36.0)
MCV: 95.2 fL (ref 80.0–100.0)
Monocytes Absolute: 0.2 K/uL (ref 0.1–1.0)
Monocytes Relative: 6 %
Neutro Abs: 1.5 K/uL — ABNORMAL LOW (ref 1.7–7.7)
Neutrophils Relative %: 51 %
Platelet Count: 210 K/uL (ref 150–400)
RBC: 3.75 MIL/uL — ABNORMAL LOW (ref 3.87–5.11)
RDW: 13 % (ref 11.5–15.5)
WBC Count: 3 K/uL — ABNORMAL LOW (ref 4.0–10.5)
nRBC: 0 % (ref 0.0–0.2)

## 2023-08-16 LAB — CMP (CANCER CENTER ONLY)
ALT: 12 U/L (ref 0–44)
AST: 17 U/L (ref 15–41)
Albumin: 4.2 g/dL (ref 3.5–5.0)
Alkaline Phosphatase: 28 U/L — ABNORMAL LOW (ref 38–126)
Anion gap: 7 (ref 5–15)
BUN: 18 mg/dL (ref 6–20)
CO2: 26 mmol/L (ref 22–32)
Calcium: 9.7 mg/dL (ref 8.9–10.3)
Chloride: 106 mmol/L (ref 98–111)
Creatinine: 0.93 mg/dL (ref 0.44–1.00)
GFR, Estimated: >60 mL/min (ref >60)
Glucose, Bld: 103 mg/dL — ABNORMAL HIGH (ref 70–99)
Potassium: 4.6 mmol/L (ref 3.5–5.1)
Sodium: 139 mmol/L (ref 135–145)
Total Bilirubin: 0.3 mg/dL (ref 0.0–1.2)
Total Protein: 6.8 g/dL (ref 6.5–8.1)

## 2023-08-16 LAB — GENETIC SCREENING ORDER

## 2023-08-17 ENCOUNTER — Other Ambulatory Visit: Payer: Self-pay | Admitting: Hematology

## 2023-08-17 LAB — CANCER ANTIGEN 27.29: CA 27.29: 31.6 U/mL (ref 0.0–38.6)

## 2023-08-25 ENCOUNTER — Other Ambulatory Visit: Payer: Self-pay | Admitting: Hematology

## 2023-08-25 LAB — SIGNATERA

## 2023-08-31 ENCOUNTER — Ambulatory Visit (HOSPITAL_COMMUNITY)
Admission: RE | Admit: 2023-08-31 | Discharge: 2023-08-31 | Disposition: A | Discharge status: Home / Self Care | Source: Ambulatory Visit | Attending: Nurse Practitioner | Admitting: Nurse Practitioner

## 2023-08-31 DIAGNOSIS — Z170 Estrogen receptor positive status [ER+]: Secondary | ICD-10-CM | POA: Insufficient documentation

## 2023-08-31 DIAGNOSIS — C50412 Malignant neoplasm of upper-outer quadrant of left female breast: Secondary | ICD-10-CM | POA: Insufficient documentation

## 2023-08-31 MED ORDER — GADOBUTROL 1 MMOL/ML IV SOLN
6.0000 mL | Freq: Once | INTRAVENOUS | Status: AC | PRN
  Administered 2023-08-31: 6 mL via INTRAVENOUS

## 2023-09-05 ENCOUNTER — Other Ambulatory Visit (HOSPITAL_COMMUNITY)
Admission: RE | Admit: 2023-09-05 | Discharge: 2023-09-05 | Disposition: A | Discharge status: Home / Self Care | Source: Ambulatory Visit | Attending: Obstetrics & Gynecology | Admitting: Obstetrics & Gynecology

## 2023-09-05 ENCOUNTER — Encounter: Payer: Self-pay | Admitting: Obstetrics & Gynecology

## 2023-09-05 ENCOUNTER — Ambulatory Visit: Admitting: Obstetrics & Gynecology

## 2023-09-05 VITALS — BP 119/68 | HR 84 | Ht 64.0 in | Wt 126.0 lb

## 2023-09-05 DIAGNOSIS — Z170 Estrogen receptor positive status [ER+]: Secondary | ICD-10-CM

## 2023-09-05 DIAGNOSIS — C50412 Malignant neoplasm of upper-outer quadrant of left female breast: Secondary | ICD-10-CM | POA: Diagnosis not present

## 2023-09-05 DIAGNOSIS — R9389 Abnormal findings on diagnostic imaging of other specified body structures: Secondary | ICD-10-CM | POA: Diagnosis present

## 2023-09-05 DIAGNOSIS — N952 Postmenopausal atrophic vaginitis: Secondary | ICD-10-CM

## 2023-09-05 NOTE — Progress Notes (Signed)
 GYN VISIT Patient name: Pamela Reyes MRN [id number]  Date of birth: 1980/10/18 Chief Complaint:   Procedure  History of Present Illness:   Pamela Reyes is a 43 y.o. G0P0000 female with h/o ER/PR + breast Ca being seen today for f/u regarding:  Thickened endometrium/AUB: Started chemo Jan 2023- no period since that time.  Seen for annual in April, then about a week after visit, she had 7-8 days of a period.  Moderate to heavy changing every 3-4 hours.  Since then no further bleeding.    Currently on Tamoxifen .  She does note some vaginal dryness and urinary discomfort.  She is using vaginal estrogen, which has significantly helped her symptoms.  Denies vaginal discharge, itching or irritation currently.  Reports no acute GYN concerns   No LMP recorded. (Menstrual status: Chemotherapy).    Review of Systems:   Pertinent items are noted in HPI Denies fever/chills, dizziness, headaches, visual disturbances, fatigue, shortness of breath, chest pain, abdominal pain, vomiting Pertinent History Reviewed:   Past Surgical History:  Procedure Laterality Date   AXILLARY LYMPH NODE DISSECTION Left 07/07/2021   Procedure: LEFT AXILLARY LYMPH NODE DISSECTION;  Surgeon: Aron Shoulders, MD;  Location: [facility];  Service: General;  Laterality: Left;   BREAST RECONSTRUCTION WITH PLACEMENT OF TISSUE EXPANDER AND FLEX HD (ACELLULAR HYDRATED DERMIS) Left 01/06/2021   Procedure: LEFT BREAST RECONSTRUCTION WITH PLACEMENT OF TISSUE EXPANDER AND ACELLULAR  DERMIS;  Surgeon: Arelia Filippo, MD;  [facility];  Service: Plastics;  Laterality: Left;   MASTECTOMY W/ SENTINEL NODE BIOPSY Left 01/06/2021   Procedure: LEFT MASTECTOMY WITH MAGTRACE;  Surgeon: Aron Shoulders, MD;  [facility]: [facility];  Service: General;  Laterality: Left;   PORT-A-CATH REMOVAL Right 07/07/2021   Procedure: REMOVAL PORT-A-CATH;  Surgeon: Aron Shoulders, MD;  Location: [facility];  Service: General;   Laterality: Right;   PORTACATH PLACEMENT Right 01/19/2021   Procedure: INSERTION PORT-A-CATH;  Surgeon: Aron Shoulders, MD;  [facility];  Service: General;  Laterality: Right;   RADIOACTIVE SEED GUIDED AXILLARY SENTINEL LYMPH NODE Left 01/06/2021   Procedure: RADIOACTIVE SEED GUIDED SENTINEL LYMPH NODE BIOPSY;  Surgeon: Aron Shoulders, MD;  [facility];  Service: General;  Laterality: Left;   REMOVAL OF TISSUE EXPANDER AND PLACEMENT OF IMPLANT Left 02/18/2022   Procedure: REMOVAL OF TISSUE EXPANDER AND PLACEMENT OF IMPLANT;  Surgeon: Arelia Filippo, MD;  Location: [facility];  Service: Plastics;  Laterality: Left;   SENTINEL NODE BIOPSY Left 01/06/2021   Procedure: LEFT AXILLARY SENTINEL LYMPH  NODE BIOPSY;  Surgeon: Aron Shoulders, MD;  [facility];  Service: General;  Laterality: Left;   WISDOM TOOTH EXTRACTION      Past Medical History:  Diagnosis Date   Allergy    Anxiety    Axillary pain, right 01/18/2021   Will get right axillary ultrasound.   Breast cancer (HCC)    Breast pain, left 10/06/2020   Encounter for gynecological examination with Papanicolaou smear of cervix 02/10/2021   Encounter for screening fecal occult blood testing 02/10/2021   Family history of breast cancer 11/11/2020   Fibroid 10/27/2020   Has 1 x .9 x .6 cm subserosal fibroid   H/O left mastectomy    left mastectomy 2022   History of hiatal hernia    Left breast mass 10/06/2020   +breast cancer   LLQ pain 10/06/2020   PONV (postoperative nausea and vomiting)    Port-A-Cath in place 03/25/2021  Reviewed problem list, medications and allergies. Physical Assessment:   Vitals:   09/05/23 1331  BP: 119/68  Pulse: 84  Weight: 126 lb (57.2 kg)  Height: 5' 4 (1.626 m)  Body mass index is 21.63 kg/m.       Physical Examination:   General appearance: alert, well appearing, and in no distress  Psych: mood appropriate, normal affect  Skin: warm & dry   Cardiovascular:  normal heart rate noted  Respiratory: normal respiratory effort, no distress  Abdomen: soft, non-tender   Pelvic: VULVA: normal appearing vulva with no masses, tenderness or lesions, VAGINA: normal appearing vagina with normal color and discharge, no lesions, CERVIX: normal appearing cervix without discharge or lesions, UTERUS: uterus is normal size, shape, consistency and nontender, ADNEXA: normal adnexa in size, nontender and no masses  Extremities: no edema   Chaperone: Alan Fischer    Endometrial Biopsy Procedure Note  Pre-operative Diagnosis: Thickened endometrium  Post-operative Diagnosis: same  Procedure Details  The risks (including infection, bleeding, pain, and uterine perforation) and benefits of the procedure were explained to the patient and Written informed consent was obtained.  Antibiotic prophylaxis against endocarditis was not indicated.   The patient was placed in the dorsal lithotomy position.  Bimanual exam showed the uterus to be in the neutral position.  A speculum inserted in the vagina, and the cervix prepped with betadine.     A single tooth tenaculum was applied to the anterior lip of the cervix for stabilization.  Os finder was used.  A Pipelle endometrial aspirator was used to sample the endometrium.  Sample was sent for pathologic examination.  Condition: Stable  Complications: None  Assessment & Plan:  1) Thickened endometrium - Due to tamoxifen  use and thickened endometrium with AUB, recommendation to proceed with EMB - Procedure completed as above, further management pending results of pathology - Patient had questions regarding management should bleeding return.  Discussed options vary from medical management to surgical intervention.  For now we will monitor symptoms -The patient was advised to call for any fever or for prolonged or severe pain or bleeding. She was advised to use OTC analgesics as needed for mild to moderate pain. She was advised  to avoid vaginal intercourse for 48 hours or until the bleeding has completely stopped.   No orders of the defined types were placed in this encounter.   Return in about 1 year (around 09/04/2024) for Annual.   Lometa Riggin, DO Attending Obstetrician & Gynecologist, [facility], [facility]

## 2023-09-07 ENCOUNTER — Ambulatory Visit: Payer: Self-pay | Admitting: Nurse Practitioner

## 2023-09-07 LAB — SURGICAL PATHOLOGY

## 2023-09-09 ENCOUNTER — Ambulatory Visit: Payer: Self-pay | Admitting: Obstetrics & Gynecology

## 2023-09-10 NOTE — Progress Notes (Signed)
 [facility] Return Visit   Referring Provider Lucius Krabbe, NP [address] [place],  [place] [address]  Primary Care Provider Lucius Krabbe, NP  Patient Profile: Pamela Reyes is a 43 y.o. 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  Problem List: Left upper quadrant abdominal pain Nausea History of hiatal hernia Diarrhea  History of Present Illness   Pamela Reyes was last seen in the GI office 06/07/2023 by Alan Coombs, PA   Current GI Meds  Omeprazole  20 mg orally daily Imodium  as needed  Interval History   Discussed the use of AI scribe software for clinical note transcription with the patient, who gave verbal consent to proceed.  History of Present Illness  LUQ Abdominal pain - Persistent, nebulous pain localized to the left side of the abdomen - Onset of pain prior to breast cancer diagnosis and mastectomy - Exacerbated by physical activity; no association with meals - Pain episodes last for hours and can awaken her at night particularly if lying on left side - Occurs daily but is not constant; pain can last for hours - No association with food or eating - Physical therapy and topical patches provide some relief - Scans and upper endoscopy have not identified a cause - Considers abdominal pain may be nerve-related  - Gabapentin  100 mg three times daily prescribed for abdominal pain, but not taken regularly due to concerns about drowsiness - Considering retrying gabapentin  for possible nerve-related abdominal pain  Nausea - Nausea has improved after reducing the dose of Verzenio  - Prilosec 20 mg provides symptomatic relief  Diarrhea and bowel habit changes - Diarrhea occurs every other day since starting Verzenio  - Previously experienced  constipation prior to Verzenio  - Diarrhea has improved after reducing Verzenio  dose from 150 mg to 100 mg twice daily - Uses Imodium  as needed for diarrhea - Anticipates Verzenio  course will be completed by the end of the year      Last colonoscopy: None Last endoscopy:  06/2023 -multiple sessile gastric polyps, small HH  Last Abd CT/CTE/MRE:  None  GI Review of Symptoms Significant for left upper quadrant abdominal pain, nausea, diarrhea. Otherwise negative.  General Review of Systems  Review of systems is significant for the pertinent positives and negatives as listed per the HPI.  Full ROS is otherwise negative.  Past Medical History   Past Medical History:  Diagnosis Date   Allergy    Anxiety    Axillary pain, right 01/18/2021   Will get right axillary ultrasound.   Breast cancer (HCC)    Breast pain, left 10/06/2020   Encounter for gynecological examination with Papanicolaou smear of cervix 02/10/2021   Encounter for screening fecal occult blood testing 02/10/2021   Family history of breast cancer 11/11/2020   Fibroid 10/27/2020   Has 1 x .9 x .6 cm subserosal fibroid   H/O left mastectomy    left mastectomy 2022   History of hiatal hernia    Left breast mass 10/06/2020   +breast cancer   LLQ pain 10/06/2020   PONV (postoperative nausea and vomiting)    Port-A-Cath in place 03/25/2021     Past Surgical History   Past Surgical History:  Procedure Laterality Date   AXILLARY LYMPH NODE DISSECTION Left 07/07/2021   Procedure: LEFT AXILLARY LYMPH NODE DISSECTION;  Surgeon: Aron Shoulders, MD;  Location: [facility];  Service: General;  Laterality: Left;   BREAST RECONSTRUCTION WITH PLACEMENT OF TISSUE EXPANDER AND FLEX HD (ACELLULAR HYDRATED DERMIS) Left 01/06/2021   Procedure: LEFT BREAST RECONSTRUCTION WITH PLACEMENT OF TISSUE EXPANDER AND ACELLULAR  DERMIS;  Surgeon: Arelia Filippo, MD;  [facility];  Service: Plastics;  Laterality: Left;    MASTECTOMY W/ SENTINEL NODE BIOPSY Left 01/06/2021   Procedure: LEFT MASTECTOMY WITH MAGTRACE;  Surgeon: Aron Shoulders, MD;  [facility];  Service: General;  Laterality: Left;   PORT-A-CATH REMOVAL Right 07/07/2021   Procedure: REMOVAL PORT-A-CATH;  Surgeon: Aron Shoulders, MD;  Location: [facility];  Service: General;  Laterality: Right;   PORTACATH PLACEMENT Right 01/19/2021   Procedure: INSERTION PORT-A-CATH;  Surgeon: Aron Shoulders, MD;  [facility];  Service: General;  Laterality: Right;   RADIOACTIVE SEED GUIDED AXILLARY SENTINEL LYMPH NODE Left 01/06/2021   Procedure: RADIOACTIVE SEED GUIDED SENTINEL LYMPH NODE BIOPSY;  Surgeon: Aron Shoulders, MD;  [facility];  Service: General;  Laterality: Left;   REMOVAL OF TISSUE EXPANDER AND PLACEMENT OF IMPLANT Left 02/18/2022   Procedure: REMOVAL OF TISSUE EXPANDER AND PLACEMENT OF IMPLANT;  Surgeon: Arelia Filippo, MD;  Location: [facility];  Service: Plastics;  Laterality: Left;   SENTINEL NODE BIOPSY Left 01/06/2021   Procedure: LEFT AXILLARY SENTINEL LYMPH  NODE BIOPSY;  Surgeon: Aron Shoulders, MD;  [facility];  Service: General;  Laterality: Left;   WISDOM TOOTH EXTRACTION       Allergies and Medications   Allergies  Allergen Reactions   Prochlorperazine  Other (See Comments)    Severe restlessness. Akathisia.   Current Meds  Medication Sig   acetaminophen  (TYLENOL ) 500 MG tablet Take 500-1,000 mg by mouth every 6 (six) hours as needed for moderate pain or headache.   Biotin 1 MG CAPS Take by mouth.   Cholecalciferol (DIALYVITE VITAMIN D 5000) 125 MCG (5000 UT) capsule Take 5,000 Units by mouth daily. Vit D and K supplement combined   diazepam  (VALIUM ) 5 MG tablet Place 1 tablet vaginally nightly as needed for muscle spasm/ pelvic pain.   estradiol  (ESTRACE ) 0.1 MG/GM vaginal cream PLACE 0.5G NIGHTLY FOR TWO WEEKS THEN TWICE A WEEK AFTER   gabapentin  (NEURONTIN ) 100 MG capsule Take 1  capsule (100 mg total) by mouth 3 (three) times daily.   hydrOXYzine  (ATARAX ) 10 MG tablet TAKE 1 TABLET (10 MG TOTAL) BY MOUTH DAILY AS NEEDED FOR ANXIETY, MAY TAKE 2 TIMES A DAY IF NEEDED   loperamide  (IMODIUM ) 2 MG capsule Take 1 capsule (2 mg total) by mouth 4 (four) times daily as needed for diarrhea or loose stools.   loratadine (CLARITIN) 10 MG tablet Take 10 mg by mouth daily.   omeprazole  (PRILOSEC) 20 MG capsule TAKE 1 CAPSULE BY MOUTH EVERY DAY   ondansetron  (ZOFRAN -ODT) 4 MG disintegrating tablet Take 1 tablet (4 mg total) by mouth every 8 (eight) hours as needed for nausea or vomiting.   tamoxifen  (NOLVADEX ) 20 MG tablet TAKE 1 TABLET BY MOUTH EVERY DAY   VERZENIO  100 MG tablet TAKE 1 TABLET BY MOUTH 2 TIMES A DAY     Family His   Family History  Problem Relation Age of Onset   Angina Mother    Congestive Heart Failure Father    Alcohol abuse Father    Heart disease Father    Autoimmune disease Sister    Raynaud syndrome Sister    Irritable bowel syndrome Sister  constipation   Breast cancer Sister 47       ER+   Breast cancer Maternal Aunt    Bladder Cancer Maternal Uncle        smoking hx   Cancer Maternal Uncle        kidney or prostate; dx after 50   Breast cancer Paternal Aunt        dx after 74; x2 paternal aunts   Stroke Maternal Grandmother    Lung cancer Maternal Grandfather        dx after 28; smoking hx   Breast cancer Paternal Grandmother        dx after 47   Colon cancer Neg Hx    Stomach cancer Neg Hx    Rectal cancer Neg Hx    Esophageal cancer Neg Hx     Social History   Social History   Tobacco Use   Smoking status: Former    Current packs/day: 0.00    Average packs/day: 0.3 packs/day for 4.0 years (1.0 ttl pk-yrs)    Types: Cigarettes    Start date: 10/13/1997    Quit date: 10/13/2001    Years since quitting: 21.9   Smokeless tobacco: Never   Tobacco comments:    I was a light smoker in my teens  Vaping Use   Vaping status:  Never Used  Substance Use Topics   Alcohol use: Not Currently    Alcohol/week: 8.0 standard drinks of alcohol    Types: 8 Shots of liquor per week    Comment: very rarely   Drug use: Never   Pamela Reyes reports that she quit smoking about 21 years ago. Her smoking use included cigarettes. She started smoking about 25 years ago. She has a 1 pack-year smoking history. She has never used smokeless tobacco. She reports that she does not currently use alcohol after a past usage of about 8.0 standard drinks of alcohol per week. She reports that she does not use drugs.  Vital Signs and Physical Examination   Vitals:   09/12/23 0827  BP: 100/60  Pulse: 86   Body mass index is 21.63 kg/m. Weight: 126 lb (57.2 kg)  General: Well developed, well nourished, no acute distress Head: Normocephalic and atraumatic Eyes: Sclerae anicteric, EOMI Abdomen: Soft, non tender and non distended. No masses, hepatosplenomegaly or hernias noted. Normal Bowel sounds -no focal tenderness appreciated on the left abdomen to chest Carnett's sign Rectal: Deferred    Review of Data   The following data was reviewed at the time of this encounter:   Laboratory Studies      Latest Ref Rng & Units 08/16/2023    8:29 AM 05/18/2023    2:08 AM 05/17/2023   10:26 AM  CBC  WBC 4.0 - 10.5 K/uL 3.0  3.4  2.6   Hemoglobin 12.0 - 15.0 g/dL 87.5  87.2  87.9   Hematocrit 36.0 - 46.0 % 35.7  36.9  34.5   Platelets 150 - 400 K/uL 210  157  170     Lab Results  Component Value Date   LIPASE 50 05/18/2023      Latest Ref Rng & Units 08/16/2023    8:29 AM 05/18/2023    2:08 AM 05/17/2023   10:26 AM  CMP  Glucose 70 - 99 mg/dL 896  96  85   BUN 6 - 20 mg/dL 18  18  14    Creatinine 0.44 - 1.00 mg/dL 9.06  9.05  9.11   Sodium 135 -  145 mmol/L 139  138  140   Potassium 3.5 - 5.1 mmol/L 4.6  3.9  4.7   Chloride 98 - 111 mmol/L 106  108  106   CO2 22 - 32 mmol/L 26  19  27    Calcium  8.9 - 10.3 mg/dL 9.7  9.0  9.5   Total  Protein 6.5 - 8.1 g/dL 6.8  6.6  7.0   Total Bilirubin 0.0 - 1.2 mg/dL 0.3  0.8  0.4   Alkaline Phos 38 - 126 U/L 28  26  27    AST 15 - 41 U/L 17  21  18    ALT 0 - 44 U/L 12  16  12       Imaging Studies  Abdominal ultrasound 06/12/2023 Normal  NM Bone scan 09/2022 Normal  CT Chest/Abdomen/Pelvis 08/2022 1. Status post left mastectomy with implant reconstruction. Left axillary lymph node dissection. 2. No evidence of recurrent or metastatic disease in the chest, abdomen, or pelvis. 3. Minimal subpleural radiation fibrosis of the left upper lobe. 4. Minimal, chronic fracture deformities of the anterior right fourth and fifth ribs.  AUS 10/2021 Grossly normal appearance of the left kidney and spleen.   GI Procedures and Studies  EGD 06/2023 Multiple sessile gastric polyps, small HH, o/w normal Path: Normal gastric and duodenal tissue, fundic gland polyp  Clinical Impression  It is my clinical impression that Pamela Reyes is a 43 y.o. female with;  Left upper quadrant abdominal pain Nausea History of hiatal hernia Diarrhea  Pamela Reyes presents to the office today for follow-up of left upper quadrant abdominal pain, nausea and diarrhea.  Her LUQ abdominal/flank discomfort has been chronic and predated her diagnosis of breast cancer and mastectomy.  Laboratory, radiographic and endoscopic evaluation have not disclosed abnormalities to explain her pain.  It is noteworthy that her discomfort appears to be exacerbated by physical activity and improved by topical agents such as Salonpas.  Suspect there is a musculoskeletal component.  She did not have any focal tenderness on exam today to testing for a Carnett's sign. She has already completed physical therapy.  She was previously given a prescription for gabapentin  but was reticent to take it due to concern for sedation.  We discussed the medication in further detail today and she is considering the possibility of trialing it.  She is reassured by  the negative findings on her upper endoscopy at this time.  Her symptoms of nausea and diarrhea appear to be related to treatment with Verzenio  for her history of breast cancer.  Her dose was reduced since her last visit and she reports that this has helped ameliorate her symptoms to some degree.  She anticipates completing Verzenio  at the end of the year.  She has noted symptomatic benefit from the use of omeprazole  and Imodium .  Plan  She will consider trial of gabapentin  100 mg nightly dose escalated to 100 mg p.o. 3 times daily gradually if needed.  Advised that doses of gabapentin  can be further increased if needed. Continue Prilosec 20 mg p.o. daily Continue Imodium  as needed Screening colonoscopy due at age 44   Planned Follow Up PRN  The patient or caregiver verbalized understanding of the material covered, with no barriers to understanding. All questions were answered. Patient or caregiver is agreeable with the plan outlined above.    It was a pleasure to see Pamela Reyes.  If you have any questions or concerns regarding this evaluation, do not hesitate to contact me.  Inocente Hausen, MD  [facility]

## 2023-09-12 ENCOUNTER — Encounter: Payer: Self-pay | Admitting: Pediatrics

## 2023-09-12 ENCOUNTER — Ambulatory Visit (INDEPENDENT_AMBULATORY_CARE_PROVIDER_SITE_OTHER): Admitting: Pediatrics

## 2023-09-12 VITALS — BP 100/60 | HR 86 | Ht 64.0 in | Wt 126.0 lb

## 2023-09-12 DIAGNOSIS — R110 Nausea: Secondary | ICD-10-CM | POA: Diagnosis not present

## 2023-09-12 DIAGNOSIS — R1012 Left upper quadrant pain: Secondary | ICD-10-CM

## 2023-09-12 DIAGNOSIS — R109 Unspecified abdominal pain: Secondary | ICD-10-CM

## 2023-09-12 DIAGNOSIS — Z8719 Personal history of other diseases of the digestive system: Secondary | ICD-10-CM

## 2023-09-12 DIAGNOSIS — R197 Diarrhea, unspecified: Secondary | ICD-10-CM | POA: Diagnosis not present

## 2023-09-12 NOTE — Patient Instructions (Signed)
 Follow-up as needed.   _______________________________________________________  If your blood pressure at your visit was 140/90 or greater, please contact your primary care physician to follow up on this.  _______________________________________________________  If you are age 44 or older, your body mass index should be between 23-30. Your Body mass index is 21.63 kg/m. If this is out of the aforementioned range listed, please consider follow up with your Primary Care Provider.  If you are age 26 or younger, your body mass index should be between 19-25. Your Body mass index is 21.63 kg/m. If this is out of the aformentioned range listed, please consider follow up with your Primary Care Provider.   ________________________________________________________  The  [facility] providers would like to encourage you to use MYCHART to communicate with providers for non-urgent requests or questions.  Due to long hold times on the telephone, sending your provider a message by [facility] may be a faster and more efficient way to get a response.  Please allow 48 business hours for a response.  Please remember that this is for non-urgent requests.  _______________________________________________________  [facility] is using a team-based approach to care.  Your team is made up of your doctor and two to three APPS. Our APPS (Nurse Practitioners and Physician Assistants) work with your physician to ensure care continuity for you. They are fully qualified to address your health concerns and develop a treatment plan. They communicate directly with your gastroenterologist to care for you. Seeing the Advanced Practice Practitioners on your physician's team can help you by facilitating care more promptly, often allowing for earlier appointments, access to diagnostic testing, procedures, and other specialty referrals.   Thank you for choosing me and [facility].  Dr. Inocente Hausen

## 2023-09-14 ENCOUNTER — Other Ambulatory Visit: Payer: Self-pay | Admitting: Hematology

## 2023-09-27 ENCOUNTER — Encounter: Payer: Self-pay | Admitting: Obstetrics and Gynecology

## 2023-09-27 ENCOUNTER — Ambulatory Visit (INDEPENDENT_AMBULATORY_CARE_PROVIDER_SITE_OTHER): Admitting: Obstetrics and Gynecology

## 2023-09-27 VITALS — BP 98/61 | HR 64

## 2023-09-27 DIAGNOSIS — R3989 Other symptoms and signs involving the genitourinary system: Secondary | ICD-10-CM | POA: Diagnosis not present

## 2023-09-27 DIAGNOSIS — N941 Unspecified dyspareunia: Secondary | ICD-10-CM

## 2023-09-27 DIAGNOSIS — M62838 Other muscle spasm: Secondary | ICD-10-CM | POA: Diagnosis not present

## 2023-09-27 DIAGNOSIS — N952 Postmenopausal atrophic vaginitis: Secondary | ICD-10-CM | POA: Diagnosis not present

## 2023-09-27 MED ORDER — ESTRADIOL 0.1 MG/GM VA CREA: TOPICAL_CREAM | VAGINAL | 4 refills | Status: AC

## 2023-09-27 MED ORDER — CYCLOBENZAPRINE HCL 5 MG PO TABS: 5.0000 mg | ORAL_TABLET | Freq: Three times a day (TID) | ORAL | 2 refills | Status: AC | PRN

## 2023-09-27 NOTE — Progress Notes (Signed)
 [facility] Return Visit  SUBJECTIVE  History of Present Illness: Ayomide KARINGTON ZARAZUA is a 43 y.o. female seen in follow-up for Levator spasm, vaginal atrophy, and IC. Plan at last visit was continue estrogen x2 weekly, taking supplements for her pelvic floor pain but is still having some pain after and during intercourse. No bleeding noted during intercourse and is using Uberlube for intercourse.   Reports infrequent use of vaginal dilators.   Reports that CBD (Fiora) is helpful for muscle relaxation.     Past Medical History: Patient  has a past medical history of Allergy, Anxiety, Axillary pain, right (01/18/2021), Breast cancer (HCC), Breast pain, left (10/06/2020), Encounter for gynecological examination with Papanicolaou smear of cervix (02/10/2021), Encounter for screening fecal occult blood testing (02/10/2021), Family history of breast cancer (11/11/2020), Fibroid (10/27/2020), H/O left mastectomy, History of hiatal hernia, Left breast mass (10/06/2020), LLQ pain (10/06/2020), PONV (postoperative nausea and vomiting), and Port-A-Cath in place (03/25/2021).   Past Surgical History: She  has a past surgical history that includes Wisdom tooth extraction; Mastectomy w/ sentinel node biopsy (Left, 01/06/2021); Radioactive seed guided axillary sentinel lymph node (Left, 01/06/2021); Sentinel node biopsy (Left, 01/06/2021); Breast reconstruction with placement of tissue expander and flex hd (acellular hydrated dermis) (Left, 01/06/2021); Portacath placement (Right, 01/19/2021); Axillary lymph node dissection (Left, 07/07/2021); Port-a-cath removal (Right, 07/07/2021); and Removal of tissue expander and placement of implant (Left, 02/18/2022).   Medications: She has a current medication list which includes the following prescription(s): acetaminophen , biotin, dialyvite vitamin d 5000, cyclobenzaprine , diazepam , gabapentin , hydroxyzine , loperamide , loratadine, omeprazole , ondansetron ,  tamoxifen , verzenio , estradiol , and [DISCONTINUED] investigational olanzapine /placebo.   Allergies: Patient is allergic to prochlorperazine .   Social History: Patient  reports that she quit smoking about 21 years ago. Her smoking use included cigarettes. She started smoking about 25 years ago. She has a 1 pack-year smoking history. She has never used smokeless tobacco. She reports that she does not currently use alcohol after a past usage of about 8.0 standard drinks of alcohol per week. She reports that she does not use drugs.     OBJECTIVE     Physical Exam: Vitals:   09/27/23 0759  BP: 98/61  Pulse: 64   Gen: No apparent distress, A&O x 3.  Detailed Urogynecologic Evaluation:  Deferred.    ASSESSMENT AND PLAN    Ms. Magnussen is a 43 y.o. with:  1. Levator spasm   2. Vaginal atrophy   3. Bladder pain   4. Dyspareunia, female    We discussed she can consider using a vaginal wand instead of the dilators if she needs something smaller with support. We also discussed doing a low dose of flexeril  prior to or after intercourse.  Patient to continue estrogen cream x2 weekly.  Controlled at this time.  Patient inquired about testosterone use. Would suggest she ask for oncology recommendation prior to prescribing this as this is not routinely prescribed in the office. She may explore this option further.   Patient to follow up in 1 year or sooner if needed.    Kajsa Butrum G Nixie Laube, NP

## 2023-10-05 ENCOUNTER — Telehealth: Payer: Self-pay | Admitting: *Deleted

## 2023-10-05 ENCOUNTER — Other Ambulatory Visit: Payer: Self-pay

## 2023-10-05 ENCOUNTER — Telehealth: Payer: Self-pay

## 2023-10-05 NOTE — Telephone Encounter (Signed)
 Oral Oncology Patient Advocate Encounter   Received notification that prior authorization for Verzenio  is due for renewal.   PA submitted on 10/05/23 Key [id number] Status is pending      Charlott Hamilton,  CPhT-Adv  she/her/hers [facility]  [facility] Pharmacy Technician Patient Advocate Specialist III [facility] Phone: [phone]  Fax: [phone] [email or web address]

## 2023-10-05 NOTE — Telephone Encounter (Signed)
 Connected with Toi SAUNDERS. Representative of [employer] ([phone]) reporting Request for a prior authorization for Verzenio  100 mg was faxed 09/29/2023 to [phone] for Larena M Sedler, D.O.B. 10-30-1980.  We are unable to process this without a prior authorization.  Advised fax number used was canceled a few years ago.  Provided fax number [phone], this nurse currently assist with supportive medication prior authorizations, request will be directed to appropriate staff.

## 2023-10-06 NOTE — Telephone Encounter (Signed)
 Oral Oncology Patient Advocate Encounter  Prior Authorization Renewal for Verzenio  has been approved.    PA# [id number] Effective dates: 10/04/24 through 10/01/24      Charlott Hamilton,  CPhT-Adv  she/her/hers [place]  [facility] Pharmacy Technician Patient Advocate Specialist III [facility] Phone: [phone]  Fax: [phone] [email or web address]

## 2023-10-16 ENCOUNTER — Ambulatory Visit: Payer: Self-pay | Attending: Hematology

## 2023-10-16 VITALS — Wt 123.1 lb

## 2023-10-16 DIAGNOSIS — Z483 Aftercare following surgery for neoplasm: Secondary | ICD-10-CM | POA: Insufficient documentation

## 2023-10-16 NOTE — Therapy (Signed)
 OUTPATIENT PHYSICAL THERAPY SOZO SCREENING NOTE   Patient Name: Pamela Reyes MRN: [id number] DOB:March 30, 1980, 43 y.o., female Today's Date: 10/16/2023  PCP: Lucius Krabbe, NP REFERRING PROVIDER: Aron Shoulders, MD   PT End of Session - 10/16/23 [phone]     Visit Number 6   # unchanged due to screen only   PT Start Time 0804    PT Stop Time 0808    PT Time Calculation (min) 4 min    Activity Tolerance Patient tolerated treatment well    Behavior During Therapy [facility] for tasks assessed/performed          Past Medical History:  Diagnosis Date   Allergy    Anxiety    Axillary pain, right 01/18/2021   Will get right axillary ultrasound.   Breast cancer (HCC)    Breast pain, left 10/06/2020   Encounter for gynecological examination with Papanicolaou smear of cervix 02/10/2021   Encounter for screening fecal occult blood testing 02/10/2021   Family history of breast cancer 11/11/2020   Fibroid 10/27/2020   Has 1 x .9 x .6 cm subserosal fibroid   H/O left mastectomy    left mastectomy 2022   History of hiatal hernia    Left breast mass 10/06/2020   +breast cancer   LLQ pain 10/06/2020   PONV (postoperative nausea and vomiting)    Port-A-Cath in place 03/25/2021   Past Surgical History:  Procedure Laterality Date   AXILLARY LYMPH NODE DISSECTION Left 07/07/2021   Procedure: LEFT AXILLARY LYMPH NODE DISSECTION;  Surgeon: Aron Shoulders, MD;  Location: [facility];  Service: General;  Laterality: Left;   BREAST RECONSTRUCTION WITH PLACEMENT OF TISSUE EXPANDER AND FLEX HD (ACELLULAR HYDRATED DERMIS) Left 01/06/2021   Procedure: LEFT BREAST RECONSTRUCTION WITH PLACEMENT OF TISSUE EXPANDER AND ACELLULAR  DERMIS;  Surgeon: Arelia Filippo, MD;  Location: [facility];  Service: Plastics;  Laterality: Left;   MASTECTOMY W/ SENTINEL NODE BIOPSY Left 01/06/2021   Procedure: LEFT MASTECTOMY WITH MAGTRACE;  Surgeon: Aron Shoulders, MD;  Location: [facility];  Service: General;   Laterality: Left;   PORT-A-CATH REMOVAL Right 07/07/2021   Procedure: REMOVAL PORT-A-CATH;  Surgeon: Aron Shoulders, MD;  Location: [facility];  Service: General;  Laterality: Right;   PORTACATH PLACEMENT Right 01/19/2021   Procedure: INSERTION PORT-A-CATH;  Surgeon: Aron Shoulders, MD;  Location: [facility];  Service: General;  Laterality: Right;   RADIOACTIVE SEED GUIDED AXILLARY SENTINEL LYMPH NODE Left 01/06/2021   Procedure: RADIOACTIVE SEED GUIDED SENTINEL LYMPH NODE BIOPSY;  Surgeon: Aron Shoulders, MD;  Location: [facility];  Service: General;  Laterality: Left;   REMOVAL OF TISSUE EXPANDER AND PLACEMENT OF IMPLANT Left 02/18/2022   Procedure: REMOVAL OF TISSUE EXPANDER AND PLACEMENT OF IMPLANT;  Surgeon: Arelia Filippo, MD;  Location: [facility];  Service: Plastics;  Laterality: Left;   SENTINEL NODE BIOPSY Left 01/06/2021   Procedure: LEFT AXILLARY SENTINEL LYMPH  NODE BIOPSY;  Surgeon: Aron Shoulders, MD;  Location: [facility];  Service: General;  Laterality: Left;   WISDOM TOOTH EXTRACTION     Patient Active Problem List   Diagnosis Date Noted   Pelvic cramping 07/13/2023   Encounter for well woman exam with routine gynecological exam 07/13/2023   History of breast cancer 06/02/2022   Vaginal atrophy 06/02/2022   Burning with urination 10/25/2021   Urinary frequency 10/25/2021   Urgency of urination 10/25/2021   Left flank pain, chronic 09/27/2021   Paresthesia 06/08/2021   Port-A-Cath in place  03/25/2021   Encounter for screening fecal occult blood testing 02/10/2021   Encounter for gynecological examination with Papanicolaou smear of cervix 02/10/2021   Genetic testing 11/25/2020   Family history of breast cancer 11/11/2020   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 lymph node, left (HCC) 11/11/2020   Dense breasts 11/11/2020   Malignant neoplasm of upper-outer quadrant of left breast in female, estrogen receptor positive (HCC) 11/10/2020   Fibroid 10/27/2020     REFERRING DIAG: left breast cancer at risk for lymphedema  THERAPY DIAG: Aftercare following surgery for neoplasm  PERTINENT HISTORY: Diagnosed 10/29/2020 and found by self exam. Biopsy revealed IDC ER, PR +, HER 2 -.  Pt underwent left mastectomy on 01/06/2021 with expander. 4+/5 LN. Chemotherapy completed ACP, Lymph node dissection on 07/07/21 with 1/11 lymph nodes positive. Completed radiation at end of August 2023   PRECAUTIONS: left UE Lymphedema risk, None  SUBJECTIVE: Pt returns for her first 6 month L-dex screen.   PAIN:  Are you having pain? No  SOZO SCREENING: Patient was assessed today using the SOZO machine to determine the lymphedema index score. This was compared to her baseline score. It was determined that she is within the recommended range when compared to her baseline and no further action is needed at this time. She will continue SOZO screenings. These are done every 3 months for 2 years post operatively followed by every 6 months for 2 years, and then annually.   L-DEX FLOWSHEETS - 10/16/23 0800       L-DEX LYMPHEDEMA SCREENING   Measurement Type Unilateral    L-DEX MEASUREMENT EXTREMITY Upper Extremity    POSITION  Standing    DOMINANT SIDE Right    At Risk Side Left    BASELINE SCORE (UNILATERAL) -0.2    L-DEX SCORE (UNILATERAL) -0.4    VALUE CHANGE (UNILAT) -0.2         P: Cont 6 month L-dex screens until Nov 2026.    Aden Berwyn Caldron, PTA 10/16/2023, 8:07 AM

## 2023-10-17 ENCOUNTER — Other Ambulatory Visit: Payer: Self-pay | Admitting: Nurse Practitioner

## 2023-11-15 ENCOUNTER — Other Ambulatory Visit: Payer: Self-pay | Admitting: Nurse Practitioner

## 2023-11-20 ENCOUNTER — Other Ambulatory Visit: Payer: Self-pay

## 2023-11-20 DIAGNOSIS — Z170 Estrogen receptor positive status [ER+]: Secondary | ICD-10-CM

## 2023-11-20 NOTE — Progress Notes (Signed)
 Patient Care Team: Lucius Krabbe, NP as PCP - General (Family Medicine) Tyree Nanetta SAILOR, RN as Oncology Nurse Navigator Aron Shoulders, MD as Consulting Physician (General Surgery) Lanny Callander, MD as Consulting Physician (Hematology) Dewey Rush, MD as Consulting Physician (Radiation Oncology) Signa Delon LABOR, NP as Nurse Practitioner (Obstetrics and Gynecology) Burton, Lacie K, NP as Nurse Practitioner (Nurse Practitioner)  Clinic Day:  11/21/2023  Referring physician: Lucius Krabbe, NP  ASSESSMENT & PLAN:   Assessment & Plan: Malignant neoplasm of upper-outer quadrant of left breast in female, 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 Biopsy on 11/05/20 confirmed invasive ductal carcinoma, with metastatic involvement of one lymph node. -staging CT CAP and bone scan on 11/19/20 showed no distant metastasis -she underwent left mastectomy on 01/06/21 under Dr. Aron. Pathology showed 6.5 cm invasive ductal carcinoma, grade 2, involving dermis of nipple without epidermal involvement. Margins were negative, but all 4 lymph nodes showed metastatic disease. -s/p 4 cycles AC 02/11/21 - 03/25/21 and Abraxane  04/08/21 - 06/24/21. -PET scan on 06/16/21 showed no convincing evidence of local recurrence or metastatic disease. -left ALND on 07/07/21 with Dr. Aron, path showed 1/11 positive nodes -s/p postmastectomy radiation by Dr. Dewey 08/16/21 - 10/04/21. -she started Zoladex  in 07/07/21 and letrozole  on 07/21/21. She developed worsening joint pain and was switched to exemestane  in early 10/2021. But due to worsening joint pain, urinary sx, and negative QOL we switched to Tamoxifen  (and stopped zoladex ) in 04/2022  -she started Verzenio  100mg  BID on 10/28/21, dose increased to 150mg  bid later. She had UTI, fatigue etc, but overall tolerating  better now -she had reconstructive surgery on February 18, 2022, Verzenio  was held before surgery. - Signatera test for circulating tumor DNA was negative on 04/11/2023. -Bilateral breast MRI is due and ordered at today's visit, 08/16/2023. -Continue tamoxifen  20 mg daily and Verzenio  100 mg twice daily. -08/31/2023 -MR bilateral breast with/without contrast was benign. -She is due for unilateral mammogram in January 2026. -She will complete treatment with Verzenio  in December 2025.  Will continue tamoxifen  daily. - Labs with follow-up in approximately 3 months, sooner if needed.    Decreased libido This is likely from hormone blocker tamoxifen  and also Verzenio . She is using estrogen cream twice weekly, prescribed by [employer] provider. The patient and GYN provider have discussed addition of adding testosterone and compounding the vaginal cream. They wanted input from patient's oncologist due to her her history of ER + breast cancer. Discussed with Dr. Lanny. She advised that it was ok to use topical testosterone cream, compounded with estrogen cream, with minimal effective dosing. There is minimal systemic absorption from vaginal cream, thus, use of topical hormones to alleviate symptoms such as vaginal atrophy and decreased libido is considered relatively safe. Topical cream will be managed by patient's UroGyn provider.   ER + breast cancer Patient currently on tamoxifen  and verzenio  to prevent recurrence or distant metastases in the future. She will come off verzenio  in December 2025 and continue with tamoxifen  daily. Ca 27-29 is normal today. Signatera results are pending. Will conitnue with breast cancer surveillance. 3D screening unilateral right breast mammogram due in January 2026.   Neutropenia Stable. WBC 2.6 and ANC 1.3. this is likely due to tamoxifen  and Verzenio . Reviewed strict infection reduction precautions.   Plan Labs reviewed.  -stable neutropenia and leukopenia.  -unremarkable  CMP -Ca 27-29 is normal.  Continues to see UroGYN for management of symptoms related to breast cancer treatments  -OK to add topical testosterone along with topical estrogen. Will be managed per UroGyn.  Unilateral riht screening mammogram due 02/2024. Ordered as part of today's visit.  Continue Verzenio  twice daily. She is scheduled to discontinue use in December 2025.  Continue tamoxifen  20 mg daily.  Follow up 3 months and then proceed with labs and follow up every 6 months.    The patient understands the plans discussed today and is in agreement with them.  She knows to contact our office if she develops concerns prior to her next appointment.  I provided 25 minutes of face-to-face time during this encounter and > 50% was spent counseling as documented under my assessment and plan.    Pamela FORBES Lessen, NP  [facility] - A DEPT OF [facility] [address] [place] [address] Dept: [phone] Dept Fax: [phone]   Orders Placed This Encounter  Procedures   MM 3D SCREENING MAMMOGRAM UNILATERAL RIGHT BREAST    INS:BC/BS PF: [facility] 08/31/23---SCRN 03/08/23 BCG NO NEEDS YES HX CANCER/YES LEFT BRST IMPLANTS          $75 NS FEE    Standing Status:   Future    Expected Date:   03/08/2024    Expiration Date:   11/20/2024    Reason for Exam (SYMPTOM  OR DIAGNOSIS REQUIRED):   screening for breast cancer. historyof left breast cancer wiht mastectomy and reconstruction    Is the patient pregnant?:   No    Preferred [facility] location[facility]      CHIEF COMPLAINT:  CC: Left breast cancer, ER +  Current Treatment: Tamoxifen  20 mg daily, Verzenio  100 mg twice daily  INTERVAL HISTORY:  Pamela Reyes is here today for repeat clinical assessment.  She last saw me on 08/16/2023.  She since had MR bilateral breast with and without contrast.  Results were benign.  She is due for unilateral screening mammogram  in January 2026.  She is seeing urogynecology due to vaginal atrophy.  She is using a topical estrogen cream twice weekly.  Her urogynecologist has mentioned topical testosterone to help with decreased libido.  They wanted oncological input before prescribing due to prior ER + breast cancer.  Patient states she is doing well in general.  She has not noted changes in the right breast.  There are no new lumps or masses that she can palpate.  No concerns with implant on the left side.  She denies chest pain, chest pressure, or shortness of breath. She denies headaches or visual disturbances. She denies abdominal pain, nausea, vomiting, or changes in bowel or bladder habits.  She denies fevers or chills. She denies pain. Her appetite is good. Her weight has been stable.  I have reviewed the past medical history, past surgical history, social history and family history with the patient and they are unchanged from previous note.  ALLERGIES:  is allergic to prochlorperazine .  MEDICATIONS:  Current Outpatient Medications  Medication Sig Dispense Refill   acetaminophen  (TYLENOL ) 500 MG tablet Take 500-1,000 mg by mouth every 6 (six) hours as needed for moderate pain or headache.     Biotin 1 MG CAPS Take by mouth.     Cholecalciferol (DIALYVITE VITAMIN D 5000) 125 MCG (5000 UT) capsule Take 5,000 Units by mouth daily. Vit D and K supplement combined  cyclobenzaprine  (FLEXERIL ) 5 MG tablet Take 1 tablet (5 mg total) by mouth 3 (three) times daily as needed for muscle spasms. 60 tablet 2   diazepam  (VALIUM ) 5 MG tablet Place 1 tablet vaginally nightly as needed for muscle spasm/ pelvic pain. 30 tablet 0   estradiol  (ESTRACE ) 0.1 MG/GM vaginal cream Place 0.5g nightly for two weeks then twice a week after 42 g 4   gabapentin  (NEURONTIN ) 100 MG capsule Take 1 capsule (100 mg total) by mouth 3 (three) times daily. 90 capsule 2   hydrOXYzine  (ATARAX ) 10 MG tablet TAKE 1 TABLET (10 MG TOTAL) BY MOUTH DAILY AS  NEEDED FOR ANXIETY, MAY TAKE 2 TIMES A DAY IF NEEDED 180 tablet 1   loperamide  (IMODIUM ) 2 MG capsule Take 1 capsule (2 mg total) by mouth 4 (four) times daily as needed for diarrhea or loose stools. 12 capsule 0   loratadine (CLARITIN) 10 MG tablet Take 10 mg by mouth daily.     omeprazole  (PRILOSEC) 20 MG capsule TAKE 1 CAPSULE BY MOUTH EVERY DAY 90 capsule 1   ondansetron  (ZOFRAN -ODT) 4 MG disintegrating tablet Take 1 tablet (4 mg total) by mouth every 8 (eight) hours as needed for nausea or vomiting. 20 tablet 0   tamoxifen  (NOLVADEX ) 20 MG tablet TAKE 1 TABLET BY MOUTH EVERY DAY 90 tablet 1   VERZENIO  100 MG tablet TAKE 1 TABLET BY MOUTH 2 TIMES A DAY 56 tablet 0   No current facility-administered medications for this visit.    HISTORY OF PRESENT ILLNESS:   Oncology History Overview Note   Cancer Staging  Malignant neoplasm of upper-outer quadrant of left breast in female, estrogen receptor positive (HCC) Staging form: Breast, AJCC 8th Edition - Clinical stage from 11/05/2020: Stage IIA (cT2, cN1, cM0, G1, ER+, PR+, HER2: Equivocal) - Signed by Lanny Callander, MD on 11/10/2020 - Pathologic stage from 01/06/2021: Stage IB (pT3, pN2a, cM0, G2, ER+, PR+, HER2-) - Signed by Lanny Callander, MD on 01/12/2021     Malignant neoplasm of upper-outer quadrant of left breast in female, estrogen receptor positive (HCC)  10/28/2020 Mammogram   Bilateral Diagnostic Mammogram; Left Breast Ultrasound  IMPRESSION: 1. Suspicious palpable mass/hypoechoic area in the left breast at 12 o'clock retroareolar measuring 4.5 cm.   2.  Suspicious lymph node in the left axilla.   11/05/2020 Cancer Staging   Staging form: Breast, AJCC 8th Edition - Clinical stage from 11/05/2020: Stage IIA (cT2, cN1, cM0, G1, ER+, PR+, HER2: Equivocal) - Signed by Lanny Callander, MD on 11/10/2020 Stage prefix: Initial diagnosis Histologic grading system: 3 grade system   11/05/2020 Pathology Results   Diagnosis 1. Breast, left, needle core  biopsy, 12 o'clock subareolar, ribbon clip - INVASIVE MAMMARY CARCINOMA. SEE NOTE 1. Carcinoma measures 1.5 cm in greatest linear dimension and appears grade 2. 1. Immunohistochemical stain for E-cadherin is positive in the tumor cells, consistent with a ductal phenotype. 1. PROGNOSTIC INDICATORS Results: The tumor cells are EQUIVOCAL for Her2 (2+). Her2 by FISH will be performed and results reported separately. Estrogen Receptor: 70%, POSITIVE, STRONG-MODERATE STAINING INTENSITY Progesterone Receptor: 80%, POSITIVE, MODERATE STAINING INTENSITY Proliferation Marker Ki67: 1%  2. Lymph node, needle/core biopsy, left axilla, tribell clip - METASTATIC CARCINOMA TO A LYMPH NODE. SEE NOTE 2. Largest contiguous focus of metastatic carcinoma measures 0.3 cm. 2. PROGNOSTIC INDICATORS Results: The tumor cells are EQUIVOCAL for Her2 (2+). Her2 by FISH will be performed and results reported separately. Estrogen Receptor: 90%, POSITIVE, STRONG STAINING INTENSITY Progesterone Receptor: 95%,  POSITIVE, STRONG STAINING INTENSITY Proliferation Marker Ki67: 1%   11/10/2020 Initial Diagnosis   Malignant neoplasm of upper-outer quadrant of left breast in female, estrogen receptor positive (HCC)   11/19/2020 Imaging   EXAM: CT CHEST, ABDOMEN, AND PELVIS WITH CONTRAST  IMPRESSION: 1. Left breast mass  as previously described. 2. Enhancing subcentimeter left axillary lymph nodes. 3. No evidence of distal metastatic disease. 4. Small hiatal hernia   11/19/2020 Imaging   EXAM: NUCLEAR MEDICINE WHOLE BODY BONE SCAN  IMPRESSION: No evidence of metastatic disease.   11/21/2020 Imaging   EXAM: BILATERAL BREAST MRI WITH AND WITHOUT CONTRAST  IMPRESSION: 1. Suspicious 1.3 centimeter mass in the anterior UPPER central RIGHT breast warranting tissue diagnosis. (Image 65 of series 7). 2. 4 millimeter possible satellite nodule posterior to the 1.3 centimeter mass in the RIGHT breast. (Image 63 of series  7). 3. Indeterminate oval mass in the UPPER central middle depth of the RIGHT breast warranting tissue diagnosis. (Image 60 of series 7). 4. Large area of enhancement in the central portion of the LEFT breast, involving all quadrants. There is associated enhancement and retraction of the LEFT nipple, with significantly smaller size of the LEFT breast. Enhancement spans at least 6.5 centimeters. 5. Enlarged, previously biopsied LEFT axillary lymph node.   11/24/2020 Genetic Testing   Negative hereditary cancer genetic testing: no pathogenic variants detected in [facility] BRCAPlus STAT Panel or [employer] CustomNext-Cancer +RNAinsight Panel.  The report dates are November 24, 2020 and November 27, 2020, respectively.   The BRCAplus panel offered by [employer] and includes sequencing and deletion/duplication analysis for the following 8 genes: ATM, BRCA1, BRCA2, CDH1, CHEK2, PALB2, PTEN, and TP53.  The CustomNext-Cancer+RNAinsight panel offered by [employer] includes sequencing and rearrangement analysis for the following 47 genes:  APC, ATM, AXIN2, BARD1, BMPR1A, BRCA1, BRCA2, BRIP1, CDH1, CDK4, CDKN2A, CHEK2, DICER1, EPCAM, GREM1, HOXB13, MEN1, MLH1, MSH2, MSH3, MSH6, MUTYH, NBN, NF1, NF2, NTHL1, PALB2, PMS2, POLD1, POLE, PTEN, RAD51C, RAD51D, RECQL, RET, SDHA, SDHAF2, SDHB, SDHC, SDHD, SMAD4, SMARCA4, STK11, TP53, TSC1, TSC2, and VHL.  RNA data is routinely analyzed for use in variant interpretation for all genes.    11/27/2020 Pathology Results   Diagnosis Breast, right, needle core biopsy, anterior upper central - FIBROADENOMATOID AND FIBROCYSTIC CHANGES WITH CALCIFICATIONS - PSEUDOANGIOMATOUS STROMAL HYPERPLASIA - FOCAL PERIDUCTULAR CHRONIC INFLAMMATION - NO MALIGNANCY IDENTIFIED   01/06/2021 Cancer Staging   Staging form: Breast, AJCC 8th Edition - Pathologic stage from 01/06/2021: Stage IB (pT3, pN2a, cM0, G2, ER+, PR+, HER2-) - Signed by Lanny Callander, MD on 01/12/2021 Stage prefix: Initial  diagnosis Histologic grading system: 3 grade system Residual tumor (R): R0 - None   01/06/2021 Definitive Surgery   FINAL MICROSCOPIC DIAGNOSIS:   A. LYMPH NODE, LEFT AXILLARY, SENTINEL, EXCISION:  - Metastatic carcinoma to a lymph node (1/1)  - Focus of metastatic carcinoma measures 1.5 cm without evidence of extranodal extension   B. 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 BREAST, LEFT INFERIOLATERAL MARGIN, EXCISION:  - Benign fibroadipose tissue  - Negative for carcinoma   D. LYMPH NODE, LEFT AXILLARY #1, SENTINEL, EXCISION:  - Metastatic carcinoma to a lymph node (1/1)  - Focus of metastatic carcinoma measures 1.4 cm without evidence of extranodal extension   E. LYMPH NODE, LEFT  AXILLARY #2, SENTINEL, EXCISION:  - Metastatic carcinoma to a lymph node (1/1)  - Focus of metastatic carcinoma measures 0.5 cm without evidence of extranodal extension   F. LYMPH NODE, LEFT AXILLARY #3, SENTINEL, EXCISION:  - Metastatic carcinoma to a lymph node (1/1)  - Focus of metastatic carcinoma measures 0.9 cm without evidence of extranodal extension   G. LYMPH NODE, LEFT AXILLARY #4, SENTINEL, EXCISION:  - Benign fibroadipose tissue, negative for carcinoma  - Lymphoid tissue is not identified    02/11/2021 - 06/24/2021 Chemotherapy   Patient is on Treatment Plan : BREAST ADJUVANT DOSE DENSE AC q14d / PACLitaxel  q7d     06/16/2021 PET scan   IMPRESSION: 1. Postsurgical change of left mastectomy and left axillary lymph node dissection with tissue expander in place. Mild hypermetabolic activity about the surgical site without suspicious focal hypermetabolic soft tissue nodularity, favored to reflect postsurgical change.   2. No convincing scintigraphic evidence of hypermetabolic local recurrence  or metastatic disease within the neck, chest, abdomen or pelvis.    Imaging   MR bilateral breast with/without contrast IMPRESSION: No MRI evidence malignancy status post LEFT mastectomy.  RECOMMENDATION: BILATERAL screening mammogram due in January 2026. Continued screening breast MRI in 1 year as clinically indicated.   BI-RADS CATEGORY  2: Benign.       REVIEW OF SYSTEMS:   Constitutional: Denies fevers, chills or abnormal weight loss Eyes: Denies blurriness of vision Ears, nose, mouth, throat, and face: Denies mucositis or sore throat Respiratory: Denies cough, dyspnea or wheezes Cardiovascular: Denies palpitation, chest discomfort or lower extremity swelling Gastrointestinal:  Denies nausea, heartburn or change in bowel habits Skin: Denies abnormal skin rashes Lymphatics: Denies new lymphadenopathy or easy bruising Neurological:Denies numbness, tingling or new weaknesses Behavioral/Psych: Mood is stable, no new changes. Decreased libido.  All other systems were reviewed with the patient and are negative.   VITALS:   Today's Vitals   11/21/23 0800 11/21/23 0833  BP:  106/60  Pulse:  81  Resp:  16  Temp:  98 F (36.7 C)  TempSrc:  Temporal  SpO2:  99%  Weight:  125 lb 8 oz (56.9 kg)  Height:  5' 4 (1.626 m)  PainSc: 0-No pain    Body mass index is 21.54 kg/m.   Wt Readings from Last 3 Encounters:  11/21/23 125 lb 8 oz (56.9 kg)  10/16/23 123 lb 2 oz (55.8 kg)  09/12/23 126 lb (57.2 kg)    Body mass index is 21.54 kg/m.  Performance status (ECOG): 1 - Symptomatic but completely ambulatory  PHYSICAL EXAM:   GENERAL:alert, no distress and comfortable SKIN: skin color, texture, turgor are normal, no rashes or significant lesions EYES: normal, Conjunctiva are pink and non-injected, sclera clear OROPHARYNX:no exudate, no erythema and lips, buccal mucosa, and tongue normal  NECK: supple, thyroid normal size, non-tender, without nodularity LYMPH:  no  palpable lymphadenopathy in the cervical, axillary or inguinal LUNGS: clear to auscultation and percussion with normal breathing effort HEART: regular rate & rhythm and no murmurs and no lower extremity edema ABDOMEN:abdomen soft, non-tender and normal bowel sounds Musculoskeletal:no cyanosis of digits and no clubbing  NEURO: alert & oriented x 3 with fluent speech, no focal motor/sensory deficits BREAST: The left breast has been surgically reconstructed.  There are no palpable masses or lumps along the left chest wall.  There is no axillary lymphadenopathy on the left.  There are no palpable masses or lumps in the right breast.  There is no  nipple inversion or nipple discharge.  There is no axillary lymphadenopathy on the right.  LABORATORY DATA:  I have reviewed the data as listed    Component Value Date/Time   NA 141 11/21/2023 0806   K 4.2 11/21/2023 0806   CL 107 11/21/2023 0806   CO2 29 11/21/2023 0806   GLUCOSE 64 (L) 11/21/2023 0806   BUN 10 11/21/2023 0806   CREATININE 0.87 11/21/2023 0806   CALCIUM  9.8 11/21/2023 0806   PROT 6.8 11/21/2023 0806   ALBUMIN 4.2 11/21/2023 0806   AST 17 11/21/2023 0806   ALT 11 11/21/2023 0806   ALKPHOS 26 (L) 11/21/2023 0806   BILITOT 0.4 11/21/2023 0806   GFRNONAA >60 11/21/2023 0806     Lab Results  Component Value Date   WBC 2.6 (L) 11/21/2023   NEUTROABS 1.3 (L) 11/21/2023   HGB 12.8 11/21/2023   HCT 36.8 11/21/2023   MCV 93.4 11/21/2023   PLT 190 11/21/2023

## 2023-11-20 NOTE — Assessment & Plan Note (Addendum)
 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 Biopsy on 11/05/20 confirmed invasive ductal carcinoma, with metastatic involvement of one lymph node. -staging CT CAP and bone scan on 11/19/20 showed no distant metastasis -she underwent left mastectomy on 01/06/21 under Dr. Aron. Pathology showed 6.5 cm invasive ductal carcinoma, grade 2, involving dermis of nipple without epidermal involvement. Margins were negative, but all 4 lymph nodes showed metastatic disease. -s/p 4 cycles AC 02/11/21 - 03/25/21 and Abraxane  04/08/21 - 06/24/21. -PET scan on 06/16/21 showed no convincing evidence of local recurrence or metastatic disease. -left ALND on 07/07/21 with Dr. Aron, path showed 1/11 positive nodes -s/p postmastectomy radiation by Dr. Dewey 08/16/21 - 10/04/21. -she started Zoladex  in 07/07/21 and letrozole  on 07/21/21. She developed worsening joint pain and was switched to exemestane  in early 10/2021. But due to worsening joint pain, urinary sx, and negative QOL we switched to Tamoxifen  (and stopped zoladex ) in 04/2022  -she started Verzenio  100mg  BID on 10/28/21, dose increased to 150mg  bid later. She had UTI, fatigue etc, but overall tolerating better now -she had reconstructive surgery on February 18, 2022, Verzenio  was held before surgery. - Signatera test for circulating tumor DNA was negative on 04/11/2023. -Bilateral breast MRI is due and ordered at today's visit, 08/16/2023. -Continue tamoxifen  20 mg daily and Verzenio  100 mg twice daily. -08/31/2023 -MR bilateral breast with/without contrast was benign. -She is due for unilateral mammogram in January 2026. -She will complete treatment with Verzenio  in December 2025.  Will continue tamoxifen  daily. - Labs with follow-up in approximately 3 months, sooner if needed.

## 2023-11-21 ENCOUNTER — Inpatient Hospital Stay: Attending: Hematology

## 2023-11-21 ENCOUNTER — Inpatient Hospital Stay: Admitting: Nurse Practitioner

## 2023-11-21 VITALS — BP 106/60 | HR 81 | Temp 98.0°F | Resp 16 | Ht 64.0 in | Wt 125.5 lb

## 2023-11-21 DIAGNOSIS — D709 Neutropenia, unspecified: Secondary | ICD-10-CM | POA: Diagnosis not present

## 2023-11-21 DIAGNOSIS — Z9012 Acquired absence of left breast and nipple: Secondary | ICD-10-CM | POA: Diagnosis not present

## 2023-11-21 DIAGNOSIS — Z170 Estrogen receptor positive status [ER+]: Secondary | ICD-10-CM | POA: Diagnosis not present

## 2023-11-21 DIAGNOSIS — Z1721 Progesterone receptor positive status: Secondary | ICD-10-CM | POA: Insufficient documentation

## 2023-11-21 DIAGNOSIS — Z79810 Long term (current) use of selective estrogen receptor modulators (SERMs): Secondary | ICD-10-CM | POA: Insufficient documentation

## 2023-11-21 DIAGNOSIS — R6882 Decreased libido: Secondary | ICD-10-CM | POA: Diagnosis not present

## 2023-11-21 DIAGNOSIS — C773 Secondary and unspecified malignant neoplasm of axilla and upper limb lymph nodes: Secondary | ICD-10-CM | POA: Insufficient documentation

## 2023-11-21 DIAGNOSIS — N952 Postmenopausal atrophic vaginitis: Secondary | ICD-10-CM | POA: Diagnosis not present

## 2023-11-21 DIAGNOSIS — C50412 Malignant neoplasm of upper-outer quadrant of left female breast: Secondary | ICD-10-CM | POA: Diagnosis not present

## 2023-11-21 DIAGNOSIS — Z1732 Human epidermal growth factor receptor 2 negative status: Secondary | ICD-10-CM | POA: Diagnosis not present

## 2023-11-21 DIAGNOSIS — Z923 Personal history of irradiation: Secondary | ICD-10-CM | POA: Insufficient documentation

## 2023-11-21 LAB — CBC WITH DIFFERENTIAL (CANCER CENTER ONLY)
Abs Immature Granulocytes: 0 K/uL (ref 0.00–0.07)
Basophils Absolute: 0 K/uL (ref 0.0–0.1)
Basophils Relative: 2 %
Eosinophils Absolute: 0 K/uL (ref 0.0–0.5)
Eosinophils Relative: 1 %
HCT: 36.8 % (ref 36.0–46.0)
Hemoglobin: 12.8 g/dL (ref 12.0–15.0)
Immature Granulocytes: 0 %
Lymphocytes Relative: 43 %
Lymphs Abs: 1.1 K/uL (ref 0.7–4.0)
MCH: 32.5 pg (ref 26.0–34.0)
MCHC: 34.8 g/dL (ref 30.0–36.0)
MCV: 93.4 fL (ref 80.0–100.0)
Monocytes Absolute: 0.2 K/uL (ref 0.1–1.0)
Monocytes Relative: 7 %
Neutro Abs: 1.3 K/uL — ABNORMAL LOW (ref 1.7–7.7)
Neutrophils Relative %: 47 %
Platelet Count: 190 K/uL (ref 150–400)
RBC: 3.94 MIL/uL (ref 3.87–5.11)
RDW: 12.9 % (ref 11.5–15.5)
WBC Count: 2.6 K/uL — ABNORMAL LOW (ref 4.0–10.5)
nRBC: 0 % (ref 0.0–0.2)

## 2023-11-21 LAB — CMP (CANCER CENTER ONLY)
ALT: 11 U/L (ref 0–44)
AST: 17 U/L (ref 15–41)
Albumin: 4.2 g/dL (ref 3.5–5.0)
Alkaline Phosphatase: 26 U/L — ABNORMAL LOW (ref 38–126)
Anion gap: 5 (ref 5–15)
BUN: 10 mg/dL (ref 6–20)
CO2: 29 mmol/L (ref 22–32)
Calcium: 9.8 mg/dL (ref 8.9–10.3)
Chloride: 107 mmol/L (ref 98–111)
Creatinine: 0.87 mg/dL (ref 0.44–1.00)
GFR, Estimated: >60 mL/min (ref >60)
Glucose, Bld: 64 mg/dL — ABNORMAL LOW (ref 70–99)
Potassium: 4.2 mmol/L (ref 3.5–5.1)
Sodium: 141 mmol/L (ref 135–145)
Total Bilirubin: 0.4 mg/dL (ref 0.0–1.2)
Total Protein: 6.8 g/dL (ref 6.5–8.1)

## 2023-11-21 LAB — GENETIC SCREENING ORDER

## 2023-11-22 ENCOUNTER — Telehealth: Payer: Self-pay | Admitting: Nurse Practitioner

## 2023-11-22 ENCOUNTER — Encounter: Payer: Self-pay | Admitting: Obstetrics and Gynecology

## 2023-11-22 ENCOUNTER — Encounter: Payer: Self-pay | Admitting: Nurse Practitioner

## 2023-11-22 LAB — CANCER ANTIGEN 27.29: CA 27.29: 28.9 U/mL (ref 0.0–38.6)

## 2023-11-22 NOTE — Telephone Encounter (Signed)
 I contacted Pamela Reyes to schedule her 3 month follow up. We scheduled around the expected date of her Mammogram order.

## 2023-11-25 LAB — SIGNATERA

## 2023-11-26 ENCOUNTER — Encounter: Payer: Self-pay | Admitting: Nurse Practitioner

## 2023-12-18 ENCOUNTER — Telehealth: Payer: Self-pay

## 2023-12-18 NOTE — Telephone Encounter (Signed)
 Pt's Signatera that drawn in clinic did not have enough blood in the tubes per [employer]; therefore, ctDNA testing could not be done.  Called pt to see if the pt could come in for [facility] lab redraw.  Pt rescheduled on 12/20/2023 @7 :30am.  Pt confirmed app and kit plus requisition given to lab receptionist.

## 2023-12-20 ENCOUNTER — Inpatient Hospital Stay: Attending: Hematology

## 2023-12-20 DIAGNOSIS — C50412 Malignant neoplasm of upper-outer quadrant of left female breast: Secondary | ICD-10-CM

## 2023-12-20 LAB — GENETIC SCREENING ORDER

## 2023-12-21 ENCOUNTER — Ambulatory Visit
Admission: RE | Admit: 2023-12-21 | Discharge: 2023-12-21 | Disposition: A | Discharge status: Home / Self Care | Source: Ambulatory Visit | Attending: Nurse Practitioner | Admitting: Nurse Practitioner

## 2023-12-21 VITALS — BP 111/77 | HR 74 | Temp 98.2°F | Resp 18

## 2023-12-21 DIAGNOSIS — H65192 Other acute nonsuppurative otitis media, left ear: Secondary | ICD-10-CM

## 2023-12-21 DIAGNOSIS — H6992 Unspecified Eustachian tube disorder, left ear: Secondary | ICD-10-CM | POA: Diagnosis not present

## 2023-12-21 MED ORDER — CETIRIZINE-PSEUDOEPHEDRINE ER 5-120 MG PO TB12: 1.0000 | ORAL_TABLET | Freq: Two times a day (BID) | ORAL | 0 refills | Status: AC

## 2023-12-21 MED ORDER — FLUTICASONE PROPIONATE 50 MCG/ACT NA SUSP: 2.0000 | Freq: Every day | NASAL | 0 refills | Status: AC

## 2023-12-21 NOTE — ED Triage Notes (Signed)
 Pt reports ear fullness, tried to do a home irrigation and was only able to a small amount.

## 2023-12-21 NOTE — Discharge Instructions (Signed)
 Take medication as prescribed. You may take over-the-counter Tylenol  or ibuprofen as needed for pain or discomfort. Do not stick or insert anything inside of the ear while symptoms persist. Apply warm compresses to the ear to help with pain or discomfort. If your symptoms fail to improve with this treatment, recommend follow-up in this clinic or with your primary care physician for further evaluation. Follow-up as needed.

## 2023-12-21 NOTE — ED Provider Notes (Signed)
 [facility]    CSN: [id number] Arrival date & time: 12/21/23  1603      History   Chief Complaint Chief Complaint  Patient presents with   Ear Fullness    Ear plugged due to wax - Entered by patient    HPI Pamela Reyes is a 43 y.o. female.   The history is provided by the patient.   Patient presents for complaints of left ear pressure.  She states that she is concerned that the ear may be plugged with wax.  She states that she irrigated the ear last evening and got most of the wax out.  She states she continues to have left ear pressure.  She denies fever, chills, ear drainage, decreased hearing, lightheadedness, or dizziness.  She states that she has had recent nasal congestion and runny nose.  Patient is presenting for ear irrigation.  Past Medical History:  Diagnosis Date   Allergy    Anxiety    Axillary pain, right 01/18/2021   Will get right axillary ultrasound.   Breast cancer (HCC)    Breast pain, left 10/06/2020   Encounter for gynecological examination with Papanicolaou smear of cervix 02/10/2021   Encounter for screening fecal occult blood testing 02/10/2021   Family history of breast cancer 11/11/2020   Fibroid 10/27/2020   Has 1 x .9 x .6 cm subserosal fibroid   H/O left mastectomy    left mastectomy 2022   History of hiatal hernia    Left breast mass 10/06/2020   +breast cancer   LLQ pain 10/06/2020   PONV (postoperative nausea and vomiting)    Port-A-Cath in place 03/25/2021    Patient Active Problem List   Diagnosis Date Noted   Pelvic cramping 07/13/2023   Encounter for well woman exam with routine gynecological exam 07/13/2023   History of breast cancer 06/02/2022   Vaginal atrophy 06/02/2022   Burning with urination 10/25/2021   Urinary frequency 10/25/2021   Urgency of urination 10/25/2021   Left flank pain, chronic 09/27/2021   Paresthesia 06/08/2021   Port-A-Cath in place 03/25/2021   Encounter for screening fecal occult  blood testing 02/10/2021   Encounter for gynecological examination with Papanicolaou smear of cervix 02/10/2021   Genetic testing 11/25/2020   Family history of breast cancer 11/11/2020   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 lymph node, left (HCC) 11/11/2020   Dense breasts 11/11/2020   Malignant neoplasm of upper-outer quadrant of left breast in female, estrogen receptor positive (HCC) 11/10/2020   Fibroid 10/27/2020    Past Surgical History:  Procedure Laterality Date   AXILLARY LYMPH NODE DISSECTION Left 07/07/2021   Procedure: LEFT AXILLARY LYMPH NODE DISSECTION;  Surgeon: Aron Shoulders, MD;  Location: [facility];  Service: General;  Laterality: Left;   BREAST RECONSTRUCTION WITH PLACEMENT OF TISSUE EXPANDER AND FLEX HD (ACELLULAR HYDRATED DERMIS) Left 01/06/2021   Procedure: LEFT BREAST RECONSTRUCTION WITH PLACEMENT OF TISSUE EXPANDER AND ACELLULAR  DERMIS;  Surgeon: Arelia Filippo, MD;  Location: [facility];  Service: Plastics;  Laterality: Left;   MASTECTOMY W/ SENTINEL NODE BIOPSY Left 01/06/2021   Procedure: LEFT MASTECTOMY WITH MAGTRACE;  Surgeon: Aron Shoulders, MD;  Location: [facility];  Service: General;  Laterality: Left;   PORT-A-CATH REMOVAL Right 07/07/2021   Procedure: REMOVAL PORT-A-CATH;  Surgeon: Aron Shoulders, MD;  Location: [facility];  Service: General;  Laterality: Right;   PORTACATH PLACEMENT Right 01/19/2021   Procedure: INSERTION PORT-A-CATH;  Surgeon: Aron Shoulders, MD;  Location:  [facility];  Service: General;  Laterality: Right;   RADIOACTIVE SEED GUIDED AXILLARY SENTINEL LYMPH NODE Left 01/06/2021   Procedure: RADIOACTIVE SEED GUIDED SENTINEL LYMPH NODE BIOPSY;  Surgeon: Aron Shoulders, MD;  Location: [facility];  Service: General;  Laterality: Left;   REMOVAL OF TISSUE EXPANDER AND PLACEMENT OF IMPLANT Left 02/18/2022   Procedure: REMOVAL OF TISSUE EXPANDER AND PLACEMENT OF IMPLANT;  Surgeon: Arelia Filippo, MD;  Location: [facility];  Service: Plastics;  Laterality: Left;   SENTINEL NODE BIOPSY Left 01/06/2021   Procedure: LEFT AXILLARY SENTINEL LYMPH  NODE BIOPSY;  Surgeon: Aron Shoulders, MD;  Location: [facility];  Service: General;  Laterality: Left;   WISDOM TOOTH EXTRACTION      OB History     Gravida  0   Para  0   Term  0   Preterm  0   AB  0   Living  0      SAB  0   IAB  0   Ectopic  0   Multiple  0   Live Births  0            Home Medications    Prior to Admission medications   Medication Sig Start Date End Date Taking? Authorizing Provider  cetirizine-pseudoephedrine (ZYRTEC-D) 5-120 MG tablet Take 1 tablet by mouth 2 (two) times daily. 12/21/23  Yes Leath-Warren, Etta PARAS, NP  fluticasone (FLONASE) 50 MCG/ACT nasal spray Place 2 sprays into both nostrils daily. 12/21/23  Yes Leath-Warren, Etta PARAS, NP  acetaminophen  (TYLENOL ) 500 MG tablet Take 500-1,000 mg by mouth every 6 (six) hours as needed for moderate pain or headache.    [provider]  Biotin 1 MG CAPS Take by mouth.    [provider]  Cholecalciferol (DIALYVITE VITAMIN D 5000) 125 MCG (5000 UT) capsule Take 5,000 Units by mouth daily. Vit D and K supplement combined    [provider]  cyclobenzaprine  (FLEXERIL ) 5 MG tablet Take 1 tablet (5 mg total) by mouth 3 (three) times daily as needed for muscle spasms. 09/27/23   Zuleta, Kaitlin G, NP  diazepam  (VALIUM ) 5 MG tablet Place 1 tablet vaginally nightly as needed for muscle spasm/ pelvic pain. 03/25/22   Marilynne Rosaline SAILOR, MD  estradiol  (ESTRACE ) 0.1 MG/GM vaginal cream Place 0.5g nightly for two weeks then twice a week after 09/27/23   Zuleta, Kaitlin G, NP  gabapentin  (NEURONTIN ) 100 MG capsule Take 1 capsule (100 mg total) by mouth 3 (three) times daily. 09/12/22   Burton, Lacie K, NP  hydrOXYzine  (ATARAX ) 10 MG tablet TAKE 1 TABLET (10 MG TOTAL) BY MOUTH DAILY AS NEEDED FOR ANXIETY, MAY TAKE 2 TIMES A DAY IF NEEDED 03/23/22   Lucius Krabbe, NP  loperamide  (IMODIUM ) 2 MG capsule Take 1 capsule (2 mg total) by mouth 4 (four) times daily as needed for diarrhea or loose stools. 05/18/23   Theadore Ozell HERO, MD  loratadine (CLARITIN) 10 MG tablet Take 10 mg by mouth daily.    [provider]  omeprazole  (PRILOSEC) 20 MG capsule TAKE 1 CAPSULE BY MOUTH EVERY DAY 08/25/23   Lanny Callander, MD  ondansetron  (ZOFRAN -ODT) 4 MG disintegrating tablet Take 1 tablet (4 mg total) by mouth every 8 (eight) hours as needed for nausea or vomiting. 05/18/23   Theadore Ozell HERO, MD  tamoxifen  (NOLVADEX ) 20 MG tablet TAKE 1 TABLET BY MOUTH EVERY DAY 06/28/23   Lanny Callander, MD  VERZENIO  100 MG tablet TAKE  1 TABLET BY MOUTH 2 TIMES A DAY 10/17/23   Hanford Powell BRAVO, NP  Investigational olanzapine /placebo 10 MG capsule URCC 16070 Blister Card 2 Take 1 capsule by mouth daily. Take in the morning on Days 2-4. 02/25/21 03/01/21  Lanny Callander, MD    Family History Family History  Problem Relation Age of Onset   Angina Mother    Congestive Heart Failure Father    Alcohol abuse Father    Heart disease Father    Autoimmune disease Sister    Raynaud syndrome Sister    Irritable bowel syndrome Sister        constipation   Breast cancer Sister 77       ER+   Stroke Maternal Grandmother    Lung cancer Maternal Grandfather        dx after 47; smoking hx   Breast cancer Paternal Grandmother        dx after 50   Breast cancer Maternal Aunt    Bladder Cancer Maternal Uncle        smoking hx   Cancer Maternal Uncle        kidney or prostate; dx after 50   Breast cancer Paternal Aunt        dx after 57; x2 paternal aunts   Colon cancer Neg Hx    Stomach cancer Neg Hx    Rectal cancer Neg Hx    Esophageal cancer Neg Hx    Uterine cancer Neg Hx    Renal cancer Neg Hx     Social History Social History   Tobacco Use   Smoking status: Former    Current packs/day: 0.00    Average packs/day: 0.3 packs/day for 4.0 years (1.0 ttl pk-yrs)    Types:  Cigarettes    Start date: 10/13/1997    Quit date: 10/13/2001    Years since quitting: 22.2   Smokeless tobacco: Never   Tobacco comments:    I was a light smoker in my teens  Vaping Use   Vaping status: Never Used  Substance Use Topics   Alcohol use: Not Currently    Alcohol/week: 8.0 standard drinks of alcohol    Types: 8 Shots of liquor per week    Comment: very rarely   Drug use: Never     Allergies   Prochlorperazine    Review of Systems Review of Systems Per HPI  Physical Exam Triage Vital Signs ED Triage Vitals  Encounter Vitals Group     BP 12/21/23 1618 111/77     Girls Systolic BP Percentile --      Girls Diastolic BP Percentile --      Boys Systolic BP Percentile --      Boys Diastolic BP Percentile --      Pulse Rate 12/21/23 1618 74     Resp 12/21/23 1618 18     Temp 12/21/23 1618 98.2 F (36.8 C)     Temp Source 12/21/23 1618 Oral     SpO2 --      Weight --      Height --      Head Circumference --      Peak Flow --      Pain Score 12/21/23 1620 0     Pain Loc --      Pain Education --      Exclude from Growth Chart --    No data found.  Updated Vital Signs BP 111/77 (BP Location: Right Arm)   Pulse 74  Temp 98.2 F (36.8 C) (Oral)   Resp 18   LMP 11/28/2023 (Exact Date)   Visual Acuity Right Eye Distance:   Left Eye Distance:   Bilateral Distance:    Right Eye Near:   Left Eye Near:    Bilateral Near:     Physical Exam Vitals and nursing note reviewed.  Constitutional:      General: She is not in acute distress.    Appearance: Normal appearance.  HENT:     Head: Normocephalic.     Right Ear: Hearing, tympanic membrane, ear canal and external ear normal.     Left Ear: Hearing, ear canal and external ear normal. A middle ear effusion is present.     Nose:     Right Turbinates: Enlarged and swollen.     Left Turbinates: Enlarged and swollen.     Right Sinus: No maxillary sinus tenderness or frontal sinus tenderness.     Left  Sinus: No maxillary sinus tenderness or frontal sinus tenderness.     Mouth/Throat:     Mouth: Mucous membranes are moist.  Eyes:     Extraocular Movements: Extraocular movements intact.     Pupils: Pupils are equal, round, and reactive to light.  Pulmonary:     Effort: Pulmonary effort is normal.  Musculoskeletal:     Cervical back: Normal range of motion.  Skin:    General: Skin is warm and dry.  Neurological:     General: No focal deficit present.     Mental Status: She is alert and oriented to person, place, and time.  Psychiatric:        Mood and Affect: Mood normal.        Behavior: Behavior normal.      UC Treatments / Results  Labs (all labs ordered are listed, but only abnormal results are displayed) Labs Reviewed - No data to display  EKG   Radiology No results found.  Procedures Procedures (including critical care time)  Medications Ordered in UC Medications - No data to display  Initial Impression / Assessment and Plan / UC Course  I have reviewed the triage vital signs and the nursing notes.  Pertinent labs & imaging results that were available during my care of the patient were reviewed by me and considered in my medical decision making (see chart for details).  On exam, the patient does not have cerumen impaction of the left ear, she does have a left middle ear effusion which most likely is causing her symptoms.  Will provide symptomatic treatment with cetirizine-D 5-120 mg tablets along with fluticasone 50 mcg nasal spray.  Supportive care recommendations were provided and discussed with the patient to include fluids, rest, over-the-counter analgesics, and warm compresses to the ear.  Discussed indications with patient regarding follow-up.  Patient was in agreement with this plan of care and verbalizes understanding.  All questions were answered.  Patient stable for discharge.   Final Clinical Impressions(s) / UC Diagnoses   Final diagnoses:  Acute  middle ear effusion, left  Acute dysfunction of left eustachian tube     Discharge Instructions      Take medication as prescribed. You may take over-the-counter Tylenol  or ibuprofen as needed for pain or discomfort. Do not stick or insert anything inside of the ear while symptoms persist. Apply warm compresses to the ear to help with pain or discomfort. If your symptoms fail to improve with this treatment, recommend follow-up in this clinic or with your primary care  physician for further evaluation. Follow-up as needed.     ED Prescriptions     Medication Sig Dispense Auth. Provider   cetirizine-pseudoephedrine (ZYRTEC-D) 5-120 MG tablet Take 1 tablet by mouth 2 (two) times daily. 45 tablet Leath-Warren, Etta PARAS, NP   fluticasone (FLONASE) 50 MCG/ACT nasal spray Place 2 sprays into both nostrils daily. 16 g Leath-Warren, Etta PARAS, NP      PDMP not reviewed this encounter.   Gilmer Etta PARAS, NP 12/21/23 1630

## 2023-12-24 ENCOUNTER — Other Ambulatory Visit (HOSPITAL_BASED_OUTPATIENT_CLINIC_OR_DEPARTMENT_OTHER): Payer: Self-pay | Admitting: Obstetrics & Gynecology

## 2023-12-25 ENCOUNTER — Other Ambulatory Visit: Payer: Self-pay | Admitting: Hematology

## 2023-12-27 LAB — SIGNATERA
SIGNATERA MTM READOUT: 0 MTM/ml
SIGNATERA TEST RESULT: NEGATIVE

## 2023-12-29 ENCOUNTER — Ambulatory Visit: Payer: Self-pay | Admitting: Nurse Practitioner

## 2024-02-04 ENCOUNTER — Encounter: Payer: Self-pay | Admitting: Obstetrics & Gynecology

## 2024-02-05 ENCOUNTER — Telehealth: Payer: Self-pay | Admitting: Obstetrics & Gynecology

## 2024-02-05 NOTE — Telephone Encounter (Signed)
-  Called patient to review concerns -due to medical history plan to avoid hormonal intervention -discussed use of NSAIDs for now until appt can be scheduled to discuss more permanent options including ablation vs hysterectomy -pt to call and schedule next available

## 2024-02-13 ENCOUNTER — Ambulatory Visit: Payer: Self-pay | Admitting: Family

## 2024-02-13 ENCOUNTER — Encounter: Payer: Self-pay | Admitting: Family

## 2024-02-13 VITALS — BP 104/52 | HR 77 | Temp 98.0°F | Ht 64.0 in | Wt 123.8 lb

## 2024-02-13 DIAGNOSIS — N921 Excessive and frequent menstruation with irregular cycle: Secondary | ICD-10-CM | POA: Diagnosis not present

## 2024-02-13 LAB — CBC WITH DIFFERENTIAL/PLATELET
Basophils Absolute: 0 K/uL (ref 0.0–0.1)
Basophils Relative: 0.5 % (ref 0.0–3.0)
Eosinophils Absolute: 0 K/uL (ref 0.0–0.7)
Eosinophils Relative: 0.5 % (ref 0.0–5.0)
HCT: 35.3 % — ABNORMAL LOW (ref 36.0–46.0)
Hemoglobin: 12 g/dL (ref 12.0–15.0)
Lymphocytes Relative: 31.2 % (ref 12.0–46.0)
Lymphs Abs: 1.1 K/uL (ref 0.7–4.0)
MCHC: 33.9 g/dL (ref 30.0–36.0)
MCV: 94 fl (ref 78.0–100.0)
Monocytes Absolute: 0.3 K/uL (ref 0.1–1.0)
Monocytes Relative: 7.6 % (ref 3.0–12.0)
Neutro Abs: 2.1 K/uL (ref 1.4–7.7)
Neutrophils Relative %: 60.2 % (ref 43.0–77.0)
Platelets: 204 K/uL (ref 150.0–400.0)
RBC: 3.76 Mil/uL — ABNORMAL LOW (ref 3.87–5.11)
RDW: 12.4 % (ref 11.5–15.5)
WBC: 3.5 K/uL — ABNORMAL LOW (ref 4.0–10.5)

## 2024-02-13 LAB — FERRITIN: Ferritin: 7.9 ng/mL — ABNORMAL LOW (ref 10.0–291.0)

## 2024-02-13 LAB — TSH: TSH: 2.04 u[IU]/mL (ref 0.35–5.50)

## 2024-02-13 NOTE — Progress Notes (Signed)
 "  Patient ID: Pamela Reyes, female    DOB: 09/22/1980, 44 y.o.   MRN: [id number]  Chief Complaint  Patient presents with   Nausea    Pt c/o nausea, body aches, headaches and fatigue, present for over 1 month. Has tried rest, zofran  and tylenol , which did help slightly.   Discussed the use of AI scribe software for clinical note transcription with the patient, who gave verbal consent to proceed.  History of Present Illness Pamela Reyes is a 44 year old female with breast cancer on tamoxifen  who presents with heavy menstrual bleeding and fatigue.  She reports irregular, very heavy menses since November, with the last cycle two days before Christmas. Bleeding is extremely heavy for three days then lighter for two to three days, with significant blood loss causing fatigue and weakness. Her cycles became irregular after starting tamoxifen . She has a known small fibroid that did not cause problems before tamoxifen . She stopped Verzenio , a targeted breast cancer therapy, after two years and since then has felt generally unwell with fatigue, body aches, sporadic headaches, and cramping even outside of her periods and the heavy menstrual bleeding. She currently takes tamoxifen  without recent dose change and uses Zofran  as needed for nausea. She no longer takes gabapentin . She denies fever or nasal, sinus, chest, or bowel changes. She has not had recent flu testing or vaccination. She is concerned that an unmonitored thyroid problem could be contributing to her fatigue and other symptoms.  Assessment & Plan Menorrhagia with irregular cycle Menorrhagia likely exacerbated by tamoxifen , with possible fibroid involvement. Gynecologist considering endometrial ablation due to significant blood loss. - Check TSH level today - Continue follow-up with gynecologist regarding potential endometrial ablation.  Current fatigue, body aches History of breast cancer, on tamoxifen , treated with chemotherapy and  ovarian suppression. Recently weaned off of Verzenio  which could be contributing to sx.  Tamoxifen  likely contributing to menstrual irregularities and menorrhagia. - Check TSH level today to rule our thyroid issues. - Continue tamoxifen  as prescribed.  Evaluation for iron deficiency anemia Symptoms suggest possible iron deficiency anemia due to heavy menstrual bleeding. Discussed potential low ferritin levels despite normal hemoglobin. - Ordered CBC including ferritin level. - Consider iron supplementation if iron deficiency is confirmed.   Subjective:    Outpatient Medications Prior to Visit  Medication Sig Dispense Refill   acetaminophen  (TYLENOL ) 500 MG tablet Take 500-1,000 mg by mouth every 6 (six) hours as needed for moderate pain or headache.     Biotin 1 MG CAPS Take by mouth.     Cholecalciferol (DIALYVITE VITAMIN D 5000) 125 MCG (5000 UT) capsule Take 5,000 Units by mouth daily. Vit D and K supplement combined     cyclobenzaprine  (FLEXERIL ) 5 MG tablet Take 1 tablet (5 mg total) by mouth 3 (three) times daily as needed for muscle spasms. 60 tablet 2   estradiol  (ESTRACE ) 0.1 MG/GM vaginal cream Place 0.5g nightly for two weeks then twice a week after 42 g 4   fluticasone  (FLONASE ) 50 MCG/ACT nasal spray Place 2 sprays into both nostrils daily. 16 g 0   gabapentin  (NEURONTIN ) 100 MG capsule Take 1 capsule (100 mg total) by mouth 3 (three) times daily. 90 capsule 2   hydrOXYzine  (ATARAX ) 10 MG tablet TAKE 1 TABLET (10 MG TOTAL) BY MOUTH DAILY AS NEEDED FOR ANXIETY, MAY TAKE 2 TIMES A DAY IF NEEDED 180 tablet 1   loratadine (CLARITIN) 10 MG tablet Take 10 mg by mouth daily.  omeprazole  (PRILOSEC) 20 MG capsule TAKE 1 CAPSULE BY MOUTH EVERY DAY 90 capsule 1   ondansetron  (ZOFRAN -ODT) 4 MG disintegrating tablet Take 1 tablet (4 mg total) by mouth every 8 (eight) hours as needed for nausea or vomiting. 20 tablet 0   tamoxifen  (NOLVADEX ) 20 MG tablet TAKE 1 TABLET BY MOUTH EVERY DAY 90  tablet 1   cetirizine -pseudoephedrine  (ZYRTEC -D) 5-120 MG tablet Take 1 tablet by mouth 2 (two) times daily. 45 tablet 0   diazepam  (VALIUM ) 5 MG tablet Place 1 tablet vaginally nightly as needed for muscle spasm/ pelvic pain. 30 tablet 0   loperamide  (IMODIUM ) 2 MG capsule Take 1 capsule (2 mg total) by mouth 4 (four) times daily as needed for diarrhea or loose stools. 12 capsule 0   VERZENIO  100 MG tablet TAKE 1 TABLET BY MOUTH 2 TIMES A DAY 56 tablet 0   No facility-administered medications prior to visit.   Past Medical History:  Diagnosis Date   Allergy    Anxiety    Axillary pain, right 01/18/2021   Will get right axillary ultrasound.   Breast cancer (HCC)    Breast pain, left 10/06/2020   Encounter for gynecological examination with Papanicolaou smear of cervix 02/10/2021   Encounter for screening fecal occult blood testing 02/10/2021   Family history of breast cancer 11/11/2020   Fibroid 10/27/2020   Has 1 x .9 x .6 cm subserosal fibroid   H/O left mastectomy    left mastectomy 2022   History of hiatal hernia    History of therapeutic radiation 12/13/2021   Left breast mass 10/06/2020   +breast cancer   LLQ pain 10/06/2020   PONV (postoperative nausea and vomiting)    Port-A-Cath in place 03/25/2021   Past Surgical History:  Procedure Laterality Date   AXILLARY LYMPH NODE DISSECTION Left 07/07/2021   Procedure: LEFT AXILLARY LYMPH NODE DISSECTION;  Surgeon: Aron Shoulders, MD;  Location: [facility];  Service: General;  Laterality: Left;   BREAST RECONSTRUCTION WITH PLACEMENT OF TISSUE EXPANDER AND FLEX HD (ACELLULAR HYDRATED DERMIS) Left 01/06/2021   Procedure: LEFT BREAST RECONSTRUCTION WITH PLACEMENT OF TISSUE EXPANDER AND ACELLULAR  DERMIS;  Surgeon: Arelia Filippo, MD;  Location: [facility];  Service: Plastics;  Laterality: Left;   MASTECTOMY W/ SENTINEL NODE BIOPSY Left 01/06/2021   Procedure: LEFT MASTECTOMY WITH MAGTRACE;  Surgeon: Aron Shoulders, MD;   Location: [facility];  Service: General;  Laterality: Left;   PORT-A-CATH REMOVAL Right 07/07/2021   Procedure: REMOVAL PORT-A-CATH;  Surgeon: Aron Shoulders, MD;  Location: [facility];  Service: General;  Laterality: Right;   PORTACATH PLACEMENT Right 01/19/2021   Procedure: INSERTION PORT-A-CATH;  Surgeon: Aron Shoulders, MD;  [facility];  Service: General;  Laterality: Right;   RADIOACTIVE SEED GUIDED AXILLARY SENTINEL LYMPH NODE Left 01/06/2021   Procedure: RADIOACTIVE SEED GUIDED SENTINEL LYMPH NODE BIOPSY;  Surgeon: Aron Shoulders, MD;  [facility]: [facility];  Service: General;  Laterality: Left;   REMOVAL OF TISSUE EXPANDER AND PLACEMENT OF IMPLANT Left 02/18/2022   Procedure: REMOVAL OF TISSUE EXPANDER AND PLACEMENT OF IMPLANT;  Surgeon: Arelia Filippo, MD;  [facility];  Service: Plastics;  Laterality: Left;   SENTINEL NODE BIOPSY Left 01/06/2021   Procedure: LEFT AXILLARY SENTINEL LYMPH  NODE BIOPSY;  Surgeon: Aron Shoulders, MD;  Location: [facility];  Service: General;  Laterality: Left;   WISDOM TOOTH EXTRACTION     Allergies[1]    Objective:    Physical Exam  Vitals and nursing note reviewed.  Constitutional:      Appearance: Normal appearance.  Cardiovascular:     Rate and Rhythm: Normal rate and regular rhythm.  Pulmonary:     Effort: Pulmonary effort is normal.     Breath sounds: Normal breath sounds.  Musculoskeletal:        General: Normal range of motion.  Skin:    General: Skin is warm and dry.  Neurological:     Mental Status: She is alert.  Psychiatric:        Mood and Affect: Mood normal.        Behavior: Behavior normal.    BP (!) 104/52 (BP Location: Left Arm, Patient Position: Sitting, Cuff Size: Normal)   Pulse 77   Temp 98 F (36.7 C) (Temporal)   Ht 5' 4 (1.626 m)   Wt 123 lb 12.8 oz (56.2 kg)   LMP 01/30/2024 (Exact Date)   SpO2 99%   BMI 21.25 kg/m  Wt Readings from Last 3 Encounters:  02/13/24 123 lb 12.8 oz  (56.2 kg)  11/21/23 125 lb 8 oz (56.9 kg)  10/16/23 123 lb 2 oz (55.8 kg)       Harlie Ragle, NP     [1]  Allergies Allergen Reactions   Prochlorperazine  Other (See Comments)    Severe restlessness. Akathisia.   "

## 2024-02-14 ENCOUNTER — Ambulatory Visit: Payer: Self-pay | Admitting: Family

## 2024-02-14 DIAGNOSIS — E611 Iron deficiency: Secondary | ICD-10-CM

## 2024-02-15 MED ORDER — FERROUS SULFATE ER 50 MG PO TBCR: 1.0000 | EXTENDED_RELEASE_TABLET | Freq: Every day | ORAL | 1 refills | Status: AC

## 2024-03-04 ENCOUNTER — Ambulatory Visit: Admitting: Obstetrics & Gynecology

## 2024-03-08 ENCOUNTER — Ambulatory Visit
Admission: RE | Admit: 2024-03-08 | Discharge: 2024-03-08 | Disposition: A | Source: Ambulatory Visit | Attending: Nurse Practitioner

## 2024-03-08 DIAGNOSIS — C50412 Malignant neoplasm of upper-outer quadrant of left female breast: Secondary | ICD-10-CM

## 2024-03-13 ENCOUNTER — Encounter: Payer: Self-pay | Admitting: Obstetrics & Gynecology

## 2024-03-13 ENCOUNTER — Ambulatory Visit: Admitting: Obstetrics & Gynecology

## 2024-03-13 VITALS — BP 117/56 | HR 88 | Ht 64.0 in | Wt 123.0 lb

## 2024-03-13 DIAGNOSIS — Z853 Personal history of malignant neoplasm of breast: Secondary | ICD-10-CM

## 2024-03-13 DIAGNOSIS — N921 Excessive and frequent menstruation with irregular cycle: Secondary | ICD-10-CM

## 2024-03-13 DIAGNOSIS — Z3009 Encounter for other general counseling and advice on contraception: Secondary | ICD-10-CM

## 2024-03-13 NOTE — Progress Notes (Signed)
 "  GYN VISIT Patient name: Pamela Reyes MRN [id number]  Date of birth: Oct 20, 1980 Chief Complaint:   Consult (Wants to discuss surgery options)  History of Present Illness:   Pamela Reyes is a 44 y.o. G0P0000 female with h/o ER/BR + breast Ca being seen today for the following concerns:  AUB/HMB: Over the past several months she had noted return of her menses.  While her last period was normal- about 6 days with moderate bleeding- pad every couple every few hours; prior to this her periods were moderate to heavy lasting for a full week and changing her pad frequently.  The bleeding had led to iron deficiency anemia.  Due to her medical h/o (Breast Ca) she is not able to regulate her menses with hormones and additionally needs to discuss contraceptive options.  Reports no other acute GYN concerns since her prior visit.  Patient's last menstrual period was 03/08/2024 (exact date).    Review of Systems:   Pertinent items are noted in HPI Denies fever/chills, dizziness, headaches, visual disturbances, fatigue, shortness of breath, chest pain, abdominal pain, vomiting, no problems with bowel movements, urination, or intercourse unless otherwise stated above.  Pertinent History Reviewed:   Past Surgical History:  Procedure Laterality Date   AXILLARY LYMPH NODE DISSECTION Left 07/07/2021   Procedure: LEFT AXILLARY LYMPH NODE DISSECTION;  Surgeon: Aron Shoulders, MD;  Location: [facility];  Service: General;  Laterality: Left;   BREAST RECONSTRUCTION WITH PLACEMENT OF TISSUE EXPANDER AND FLEX HD (ACELLULAR HYDRATED DERMIS) Left 01/06/2021   Procedure: LEFT BREAST RECONSTRUCTION WITH PLACEMENT OF TISSUE EXPANDER AND ACELLULAR  DERMIS;  Surgeon: Arelia Filippo, MD;  Location: [facility];  Service: Plastics;  Laterality: Left;   MASTECTOMY W/ SENTINEL NODE BIOPSY Left 01/06/2021   Procedure: LEFT MASTECTOMY WITH MAGTRACE;  Surgeon: Aron Shoulders, MD;  Location: [facility];  Service:  General;  Laterality: Left;   PORT-A-CATH REMOVAL Right 07/07/2021   Procedure: REMOVAL PORT-A-CATH;  Surgeon: Aron Shoulders, MD;  Location: [facility];  Service: General;  Laterality: Right;   PORTACATH PLACEMENT Right 01/19/2021   Procedure: INSERTION PORT-A-CATH;  Surgeon: Aron Shoulders, MD;  Location: [facility];  Service: General;  Laterality: Right;   RADIOACTIVE SEED GUIDED AXILLARY SENTINEL LYMPH NODE Left 01/06/2021   Procedure: RADIOACTIVE SEED GUIDED SENTINEL LYMPH NODE BIOPSY;  Surgeon: Aron Shoulders, MD;  Location: [facility];  Service: General;  Laterality: Left;   REMOVAL OF TISSUE EXPANDER AND PLACEMENT OF IMPLANT Left 02/18/2022   Procedure: REMOVAL OF TISSUE EXPANDER AND PLACEMENT OF IMPLANT;  Surgeon: Arelia Filippo, MD;  Location: [facility];  Service: Plastics;  Laterality: Left;   SENTINEL NODE BIOPSY Left 01/06/2021   Procedure: LEFT AXILLARY SENTINEL LYMPH  NODE BIOPSY;  Surgeon: Aron Shoulders, MD;  Location: [facility];  Service: General;  Laterality: Left;   WISDOM TOOTH EXTRACTION      Past Medical History:  Diagnosis Date   Allergy    Anxiety    Axillary pain, right 01/18/2021   Will get right axillary ultrasound.   Breast cancer (HCC)    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 lymph node, left (HCC) 11/11/2020   Last Assessment & Plan: Given the length of time this mass has been present, the positive node, and the rib pain, we will obtain staging studies.        Breast pain, left 10/06/2020   Burning with urination 10/25/2021   Encounter for gynecological examination with Papanicolaou smear of cervix  02/10/2021   Encounter for screening fecal occult blood testing 02/10/2021   Encounter for well woman exam with routine gynecological exam 07/13/2023   Family history of breast cancer 11/11/2020   Fibroid 10/27/2020   Has 1 x .9 x .6 cm subserosal fibroid   H/O left mastectomy    left mastectomy 2022   History of hiatal hernia    History of  therapeutic radiation 12/13/2021   Left breast mass 10/06/2020   +breast cancer   LLQ pain 10/06/2020   PONV (postoperative nausea and vomiting)    Port-A-Cath in place 03/25/2021   Urgency of urination 10/25/2021   Urinary frequency 10/25/2021   Reviewed problem list, medications and allergies. Physical Assessment:   Vitals:   03/13/24 0830  BP: (!) 117/56  Pulse: 88  Weight: 123 lb (55.8 kg)  Height: 5' 4 (1.626 m)  Body mass index is 21.11 kg/m.       Physical Examination:   General appearance: alert, well appearing, and in no distress  Psych: mood appropriate, normal affect  Skin: warm & dry   Cardiovascular: normal heart rate noted  Respiratory: normal respiratory effort, no distress  Pelvic: examination not indicated  Extremities: no edema   Chaperone: N/A    EMB completed, benign pathology - Ultrasound completed normal uterus with no underlying etiology measuring 8.5 x 4.4 x 5.3 cm  Assessment & Plan:  1) HMB, contraceptive planning, h/o breast Ca - Reviewed all nonhormonal options to regulate her menses including Lysteda - Discussed surgical intervention including endometrial ablation versus hysterectomy.  Reviewed risk/benefit of each option and anticipated recovery - Discussed that endometrial ablation is not contraceptive and patient also desired bilateral salpingectomy - Patient has done some research on her own and leading towards laparoscopic salpingectomy along with hydrothermal ablation - Discussed risk benefit including but not limited to risk of bleeding, infection, injury due to uterine perforation and inability to complete ablation - Questions and concerns were addressed and patient desires to proceed - Plan to schedule for March 3 around the end of her menses - Surgical referral created   Orders Placed This Encounter  Procedures   Ambulatory Referral For Surgery Scheduling    Return for surgery being scheduled March 3.   Marda Breidenbach,  DO Attending Obstetrician & Gynecologist, [facility] for [facility], [facility]    "

## 2024-03-15 ENCOUNTER — Other Ambulatory Visit: Payer: Self-pay

## 2024-03-15 NOTE — Progress Notes (Unsigned)
 " Patient Care Team: Lucius Krabbe, NP as PCP - General (Family Medicine) Tyree Nanetta SAILOR, RN as Oncology Nurse Navigator Aron Shoulders, MD as Consulting Physician (General Surgery) Lanny Callander, MD as Consulting Physician (Hematology) Dewey Rush, MD as Consulting Physician (Radiation Oncology) Signa Delon LABOR, NP as Nurse Practitioner (Obstetrics and Gynecology) Burton, Lacie K, NP as Nurse Practitioner (Nurse Practitioner)  Clinic Day:  03/15/2024  Referring physician: Lucius Krabbe, NP  ASSESSMENT & PLAN:   Assessment & Plan: No problem-specific Assessment & Plan notes found for this encounter.    The patient understands the plans discussed today and is in agreement with them.  She knows to contact our office if she develops concerns prior to her next appointment.  I provided *** minutes of face-to-face time during this encounter and > 50% was spent counseling as documented under my assessment and plan.    Powell FORBES Lessen, NP  [facility] - A DEPT OF [facility] [address] [place] [address] Dept: [phone] Dept Fax: [phone]   No orders of the defined types were placed in this encounter.     CHIEF COMPLAINT:  CC: left breast cancer, ER +  Current Treatment:  tamoxifen  20 mg daily; verzenio  BID  INTERVAL HISTORY:  Cathaleen is here today for repeat clinical assessment. She last saw me on 11/21/2023. She denies fevers or chills. She denies pain. Her appetite is good. Her weight {Weight change:10426}.  I have reviewed the past medical history, past surgical history, social history and family history with the patient and they are unchanged from previous note.  ALLERGIES:  is allergic to prochlorperazine .  MEDICATIONS:  Current Outpatient Medications  Medication Sig Dispense Refill   acetaminophen  (TYLENOL ) 500 MG tablet Take 500-1,000 mg by mouth every 6 (six) hours as needed for  moderate pain or headache.     Biotin 1 MG CAPS Take by mouth.     cetirizine -pseudoephedrine  (ZYRTEC -D) 5-120 MG tablet Take 1 tablet by mouth 2 (two) times daily. 45 tablet 0   Cholecalciferol (DIALYVITE VITAMIN D 5000) 125 MCG (5000 UT) capsule Take 5,000 Units by mouth daily. Vit D and K supplement combined     cyclobenzaprine  (FLEXERIL ) 5 MG tablet Take 1 tablet (5 mg total) by mouth 3 (three) times daily as needed for muscle spasms. 60 tablet 2   diazepam  (VALIUM ) 5 MG tablet Place 1 tablet vaginally nightly as needed for muscle spasm/ pelvic pain. 30 tablet 0   estradiol  (ESTRACE ) 0.1 MG/GM vaginal cream Place 0.5g nightly for two weeks then twice a week after 42 g 4   Ferrous Sulfate  (SLOW RELEASE IRON) 50 MG TBCR Take 1 tablet by mouth daily. 90 tablet 1   fluticasone  (FLONASE ) 50 MCG/ACT nasal spray Place 2 sprays into both nostrils daily. 16 g 0   hydrOXYzine  (ATARAX ) 10 MG tablet TAKE 1 TABLET (10 MG TOTAL) BY MOUTH DAILY AS NEEDED FOR ANXIETY, MAY TAKE 2 TIMES A DAY IF NEEDED 180 tablet 1   loperamide  (IMODIUM ) 2 MG capsule Take 1 capsule (2 mg total) by mouth 4 (four) times daily as needed for diarrhea or loose stools. 12 capsule 0   loratadine (CLARITIN) 10 MG tablet Take 10 mg by mouth daily.     omeprazole  (PRILOSEC) 20 MG capsule TAKE 1 CAPSULE BY MOUTH EVERY DAY 90 capsule 1   ondansetron  (ZOFRAN -ODT) 4 MG disintegrating tablet Take 1 tablet (4 mg total) by mouth  every 8 (eight) hours as needed for nausea or vomiting. 20 tablet 0   tamoxifen  (NOLVADEX ) 20 MG tablet TAKE 1 TABLET BY MOUTH EVERY DAY 90 tablet 1   No current facility-administered medications for this visit.    HISTORY OF PRESENT ILLNESS:   Oncology History Overview Note   Cancer Staging  Malignant neoplasm of upper-outer quadrant of left breast in female, estrogen receptor positive (HCC) Staging form: Breast, AJCC 8th Edition - Clinical stage from 11/05/2020: Stage IIA (cT2, cN1, cM0, G1, ER+, PR+, HER2:  Equivocal) - Signed by Lanny Callander, MD on 11/10/2020 - Pathologic stage from 01/06/2021: Stage IB (pT3, pN2a, cM0, G2, ER+, PR+, HER2-) - Signed by Lanny Callander, MD on 01/12/2021     Malignant neoplasm of upper-outer quadrant of left breast in female, estrogen receptor positive (HCC)  10/28/2020 Mammogram   Bilateral Diagnostic Mammogram; Left Breast Ultrasound  IMPRESSION: 1. Suspicious palpable mass/hypoechoic area in the left breast at 12 o'clock retroareolar measuring 4.5 cm.   2.  Suspicious lymph node in the left axilla.   11/05/2020 Cancer Staging   Staging form: Breast, AJCC 8th Edition - Clinical stage from 11/05/2020: Stage IIA (cT2, cN1, cM0, G1, ER+, PR+, HER2: Equivocal) - Signed by Lanny Callander, MD on 11/10/2020 Stage prefix: Initial diagnosis Histologic grading system: 3 grade system   11/05/2020 Pathology Results   Diagnosis 1. Breast, left, needle core biopsy, 12 o'clock subareolar, ribbon clip - INVASIVE MAMMARY CARCINOMA. SEE NOTE 1. Carcinoma measures 1.5 cm in greatest linear dimension and appears grade 2. 1. Immunohistochemical stain for E-cadherin is positive in the tumor cells, consistent with a ductal phenotype. 1. PROGNOSTIC INDICATORS Results: The tumor cells are EQUIVOCAL for Her2 (2+). Her2 by FISH will be performed and results reported separately. Estrogen Receptor: 70%, POSITIVE, STRONG-MODERATE STAINING INTENSITY Progesterone Receptor: 80%, POSITIVE, MODERATE STAINING INTENSITY Proliferation Marker Ki67: 1%  2. Lymph node, needle/core biopsy, left axilla, tribell clip - METASTATIC CARCINOMA TO A LYMPH NODE. SEE NOTE 2. Largest contiguous focus of metastatic carcinoma measures 0.3 cm. 2. PROGNOSTIC INDICATORS Results: The tumor cells are EQUIVOCAL for Her2 (2+). Her2 by FISH will be performed and results reported separately. Estrogen Receptor: 90%, POSITIVE, STRONG STAINING INTENSITY Progesterone Receptor: 95%, POSITIVE, STRONG STAINING INTENSITY Proliferation  Marker Ki67: 1%   11/10/2020 Initial Diagnosis   Malignant neoplasm of upper-outer quadrant of left breast in female, estrogen receptor positive (HCC)   11/19/2020 Imaging   EXAM: CT CHEST, ABDOMEN, AND PELVIS WITH CONTRAST  IMPRESSION: 1. Left breast mass  as previously described. 2. Enhancing subcentimeter left axillary lymph nodes. 3. No evidence of distal metastatic disease. 4. Small hiatal hernia   11/19/2020 Imaging   EXAM: NUCLEAR MEDICINE WHOLE BODY BONE SCAN  IMPRESSION: No evidence of metastatic disease.   11/21/2020 Imaging   EXAM: BILATERAL BREAST MRI WITH AND WITHOUT CONTRAST  IMPRESSION: 1. Suspicious 1.3 centimeter mass in the anterior UPPER central RIGHT breast warranting tissue diagnosis. (Image 65 of series 7). 2. 4 millimeter possible satellite nodule posterior to the 1.3 centimeter mass in the RIGHT breast. (Image 63 of series 7). 3. Indeterminate oval mass in the UPPER central middle depth of the RIGHT breast warranting tissue diagnosis. (Image 60 of series 7). 4. Large area of enhancement in the central portion of the LEFT breast, involving all quadrants. There is associated enhancement and retraction of the LEFT nipple, with significantly smaller size of the LEFT breast. Enhancement spans at least 6.5 centimeters. 5. Enlarged, previously biopsied LEFT axillary lymph  node.   11/24/2020 Genetic Testing   Negative hereditary cancer genetic testing: no pathogenic variants detected in [facility] BRCAPlus STAT Panel or [employer] CustomNext-Cancer +RNAinsight Panel.  The report dates are November 24, 2020 and November 27, 2020, respectively.   The BRCAplus panel offered by [employer] and includes sequencing and deletion/duplication analysis for the following 8 genes: ATM, BRCA1, BRCA2, CDH1, CHEK2, PALB2, PTEN, and TP53.  The CustomNext-Cancer+RNAinsight panel offered by [employer] includes sequencing and rearrangement analysis for the following 47 genes:  APC,  ATM, AXIN2, BARD1, BMPR1A, BRCA1, BRCA2, BRIP1, CDH1, CDK4, CDKN2A, CHEK2, DICER1, EPCAM, GREM1, HOXB13, MEN1, MLH1, MSH2, MSH3, MSH6, MUTYH, NBN, NF1, NF2, NTHL1, PALB2, PMS2, POLD1, POLE, PTEN, RAD51C, RAD51D, RECQL, RET, SDHA, SDHAF2, SDHB, SDHC, SDHD, SMAD4, SMARCA4, STK11, TP53, TSC1, TSC2, and VHL.  RNA data is routinely analyzed for use in variant interpretation for all genes.    11/27/2020 Pathology Results   Diagnosis Breast, right, needle core biopsy, anterior upper central - FIBROADENOMATOID AND FIBROCYSTIC CHANGES WITH CALCIFICATIONS - PSEUDOANGIOMATOUS STROMAL HYPERPLASIA - FOCAL PERIDUCTULAR CHRONIC INFLAMMATION - NO MALIGNANCY IDENTIFIED   01/06/2021 Cancer Staging   Staging form: Breast, AJCC 8th Edition - Pathologic stage from 01/06/2021: Stage IB (pT3, pN2a, cM0, G2, ER+, PR+, HER2-) - Signed by Lanny Callander, MD on 01/12/2021 Stage prefix: Initial diagnosis Histologic grading system: 3 grade system Residual tumor (R): R0 - None   01/06/2021 Definitive Surgery   FINAL MICROSCOPIC DIAGNOSIS:   A. LYMPH NODE, LEFT AXILLARY, SENTINEL, EXCISION:  - Metastatic carcinoma to a lymph node (1/1)  - Focus of metastatic carcinoma measures 1.5 cm without evidence of extranodal extension   B. 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 BREAST, LEFT INFERIOLATERAL MARGIN, EXCISION:  - Benign fibroadipose tissue  - Negative for carcinoma   D. LYMPH NODE, LEFT AXILLARY #1, SENTINEL, EXCISION:  - Metastatic carcinoma to a lymph node (1/1)  - Focus of metastatic carcinoma measures 1.4 cm without evidence of extranodal extension   E. LYMPH NODE, LEFT AXILLARY #2, SENTINEL, EXCISION:  - Metastatic carcinoma to a lymph node (1/1)  - Focus of metastatic carcinoma measures 0.5 cm without evidence  of extranodal extension   F. LYMPH NODE, LEFT AXILLARY #3, SENTINEL, EXCISION:  - Metastatic carcinoma to a lymph node (1/1)  - Focus of metastatic carcinoma measures 0.9 cm without evidence of extranodal extension   G. LYMPH NODE, LEFT AXILLARY #4, SENTINEL, EXCISION:  - Benign fibroadipose tissue, negative for carcinoma  - Lymphoid tissue is not identified    02/11/2021 - 06/24/2021 Chemotherapy   Patient is on Treatment Plan : BREAST ADJUVANT DOSE DENSE AC q14d / PACLitaxel  q7d     06/16/2021 PET scan   IMPRESSION: 1. Postsurgical change of left mastectomy and left axillary lymph node dissection with tissue expander in place. Mild hypermetabolic activity about the surgical site without suspicious focal hypermetabolic soft tissue nodularity, favored to reflect postsurgical change.   2. No convincing scintigraphic evidence of hypermetabolic local recurrence or metastatic disease within the neck, chest, abdomen or pelvis.    Imaging   MR bilateral breast with/without contrast IMPRESSION: No MRI evidence malignancy status post LEFT mastectomy.  RECOMMENDATION: BILATERAL screening mammogram due in January 2026. Continued screening breast MRI in 1 year as clinically indicated.  BI-RADS CATEGORY  2: Benign.       REVIEW OF SYSTEMS:   Constitutional: Denies fevers, chills or abnormal weight loss Eyes: Denies blurriness of vision Ears, nose, mouth, throat, and face: Denies mucositis or sore throat Respiratory: Denies cough, dyspnea or wheezes Cardiovascular: Denies palpitation, chest discomfort or lower extremity swelling Gastrointestinal:  Denies nausea, heartburn or change in bowel habits Skin: Denies abnormal skin rashes Lymphatics: Denies new lymphadenopathy or easy bruising Neurological:Denies numbness, tingling or new weaknesses Behavioral/Psych: Mood is stable, no new changes  All other systems were reviewed with the patient and are negative.   VITALS:  Last menstrual  period 03/08/2024.  Wt Readings from Last 3 Encounters:  03/13/24 123 lb (55.8 kg)  02/13/24 123 lb 12.8 oz (56.2 kg)  11/21/23 125 lb 8 oz (56.9 kg)    There is no height or weight on file to calculate BMI.  Performance status (ECOG): {CHL ONC [id number]  PHYSICAL EXAM:   GENERAL:alert, no distress and comfortable SKIN: skin color, texture, turgor are normal, no rashes or significant lesions EYES: normal, Conjunctiva are pink and non-injected, sclera clear OROPHARYNX:no exudate, no erythema and lips, buccal mucosa, and tongue normal  NECK: supple, thyroid  normal size, non-tender, without nodularity LYMPH:  no palpable lymphadenopathy in the cervical, axillary or inguinal LUNGS: clear to auscultation and percussion with normal breathing effort HEART: regular rate & rhythm and no murmurs and no lower extremity edema ABDOMEN:abdomen soft, non-tender and normal bowel sounds Musculoskeletal:no cyanosis of digits and no clubbing  NEURO: alert & oriented x 3 with fluent speech, no focal motor/sensory deficits  LABORATORY DATA:  I have reviewed the data as listed    Component Value Date/Time   NA 141 11/21/2023 0806   K 4.2 11/21/2023 0806   CL 107 11/21/2023 0806   CO2 29 11/21/2023 0806   GLUCOSE 64 (L) 11/21/2023 0806   BUN 10 11/21/2023 0806   CREATININE 0.87 11/21/2023 0806   CALCIUM  9.8 11/21/2023 0806   PROT 6.8 11/21/2023 0806   ALBUMIN 4.2 11/21/2023 0806   AST 17 11/21/2023 0806   ALT 11 11/21/2023 0806   ALKPHOS 26 (L) 11/21/2023 0806   BILITOT 0.4 11/21/2023 0806   GFRNONAA >60 11/21/2023 0806    No results found for: SPEP, UPEP  Lab Results  Component Value Date   WBC 3.5 (L) 02/13/2024   NEUTROABS 2.1 02/13/2024   HGB 12.0 02/13/2024   HCT 35.3 (L) 02/13/2024   MCV 94.0 02/13/2024   PLT 204.0 02/13/2024      Chemistry      Component Value Date/Time   NA 141 11/21/2023 0806   K 4.2 11/21/2023 0806   CL 107 11/21/2023 0806   CO2 29  11/21/2023 0806   BUN 10 11/21/2023 0806   CREATININE 0.87 11/21/2023 0806      Component Value Date/Time   CALCIUM  9.8 11/21/2023 0806   ALKPHOS 26 (L) 11/21/2023 0806   AST 17 11/21/2023 0806   ALT 11 11/21/2023 0806   BILITOT 0.4 11/21/2023 0806       RADIOGRAPHIC STUDIES: I have personally reviewed the radiological images as listed and agreed with the findings in the report. MM 3D SCREENING MAMMOGRAM UNILATERAL RIGHT BREAST Result Date: 03/12/2024 CLINICAL DATA:  Screening. EXAM: DIGITAL SCREENING UNILATERAL RIGHT MAMMOGRAM WITH CAD AND TOMOSYNTHESIS TECHNIQUE: Right screening digital craniocaudal and mediolateral oblique mammograms were obtained. Right screening digital breast tomosynthesis was performed. The images were evaluated with computer-aided detection. COMPARISON:  Previous exam(s). ACR  Breast Density Category c: The breasts are heterogeneously dense, which may obscure small masses. FINDINGS: The patient has had a left mastectomy. There are no findings suspicious for malignancy. IMPRESSION: No mammographic evidence of malignancy. A result letter of this screening mammogram will be mailed directly to the patient. RECOMMENDATION: Screening mammogram in one year.  (Code:SM-R-66M) BI-RADS CATEGORY  1: Negative. Electronically Signed   By: Curtistine Noble   On: 03/12/2024 13:00   "

## 2024-03-15 NOTE — Progress Notes (Signed)
 As per Dr. Lanny, placed order in portal for [employer] successfully, kit was taken to the lab to be drawn 02/09

## 2024-03-18 ENCOUNTER — Inpatient Hospital Stay: Admitting: Nurse Practitioner

## 2024-03-18 ENCOUNTER — Inpatient Hospital Stay

## 2024-04-15 ENCOUNTER — Ambulatory Visit: Attending: Hematology
